# Patient Record
Sex: Male | Born: 1975 | Race: Black or African American | Hispanic: No | Smoking: Never smoker
Health system: Southern US, Community
[De-identification: ages and names within clinical notes are randomized; demographics above are authoritative.]

## PROBLEM LIST (undated history)

## (undated) DIAGNOSIS — E039 Hypothyroidism, unspecified: Secondary | ICD-10-CM

## (undated) DIAGNOSIS — G5600 Carpal tunnel syndrome, unspecified upper limb: Secondary | ICD-10-CM

## (undated) DIAGNOSIS — E7800 Pure hypercholesterolemia, unspecified: Secondary | ICD-10-CM

## (undated) DIAGNOSIS — E079 Disorder of thyroid, unspecified: Secondary | ICD-10-CM

## (undated) DIAGNOSIS — E119 Type 2 diabetes mellitus without complications: Secondary | ICD-10-CM

## (undated) DIAGNOSIS — M109 Gout, unspecified: Secondary | ICD-10-CM

## (undated) DIAGNOSIS — I10 Essential (primary) hypertension: Secondary | ICD-10-CM

## (undated) DIAGNOSIS — E669 Obesity, unspecified: Secondary | ICD-10-CM

## (undated) DIAGNOSIS — G629 Polyneuropathy, unspecified: Secondary | ICD-10-CM

## (undated) HISTORY — PX: TONSILLECTOMY: SUR1361

## (undated) HISTORY — PX: INCISE AND DRAIN ABCESS: PRO64

## (undated) HISTORY — PX: FRACTURE SURGERY: SHX138

---

## 2002-10-02 ENCOUNTER — Emergency Department (HOSPITAL_COMMUNITY): Admission: EM | Admit: 2002-10-02 | Discharge: 2002-10-02 | Payer: Self-pay | Admitting: Emergency Medicine

## 2002-10-02 ENCOUNTER — Encounter: Payer: Self-pay | Admitting: Emergency Medicine

## 2003-06-01 ENCOUNTER — Inpatient Hospital Stay (HOSPITAL_COMMUNITY): Admission: AD | Admit: 2003-06-01 | Discharge: 2003-06-06 | Payer: Self-pay | Admitting: Psychiatry

## 2007-09-20 ENCOUNTER — Inpatient Hospital Stay (HOSPITAL_COMMUNITY): Admission: AD | Admit: 2007-09-20 | Discharge: 2007-09-29 | Payer: Self-pay | Admitting: *Deleted

## 2007-09-20 ENCOUNTER — Ambulatory Visit: Payer: Self-pay | Admitting: *Deleted

## 2008-05-18 ENCOUNTER — Encounter: Payer: Self-pay | Admitting: Cardiology

## 2008-05-30 ENCOUNTER — Encounter: Payer: Self-pay | Admitting: Cardiology

## 2008-05-31 ENCOUNTER — Ambulatory Visit: Payer: Self-pay | Admitting: Cardiology

## 2008-05-31 DIAGNOSIS — E785 Hyperlipidemia, unspecified: Secondary | ICD-10-CM | POA: Insufficient documentation

## 2008-05-31 DIAGNOSIS — R9439 Abnormal result of other cardiovascular function study: Secondary | ICD-10-CM | POA: Insufficient documentation

## 2008-05-31 DIAGNOSIS — E039 Hypothyroidism, unspecified: Secondary | ICD-10-CM | POA: Insufficient documentation

## 2008-06-13 ENCOUNTER — Ambulatory Visit: Payer: Self-pay

## 2008-06-13 ENCOUNTER — Encounter: Payer: Self-pay | Admitting: Cardiology

## 2008-11-20 ENCOUNTER — Telehealth (INDEPENDENT_AMBULATORY_CARE_PROVIDER_SITE_OTHER): Payer: Self-pay | Admitting: *Deleted

## 2008-12-06 ENCOUNTER — Telehealth (INDEPENDENT_AMBULATORY_CARE_PROVIDER_SITE_OTHER): Payer: Self-pay | Admitting: *Deleted

## 2010-06-04 NOTE — H&P (Signed)
Barry [name], Barry [name]              ACCOUNT NO.:  [id number]   MEDICAL RECORD NO.:  [id number]          PATIENT TYPE:  IPS   LOCATION:  0502                          FACILITY:  [facility]   PHYSICIAN:  Geoffery Lyons, M.D.      DATE OF BIRTH:  09-06-1975   DATE OF ADMISSION:  09/20/2007  DATE OF DISCHARGE:                       PSYCHIATRIC ADMISSION ASSESSMENT   DATE/TIME OF ASSESSMENT:  September 21, 2007, at 9:10 a.m.   IDENTIFYING INFORMATION:  [name].  Married.  This is a voluntary admission.   HISTORY OF PRESENT ILLNESS:  [name] admission for this 35 year old,  who reports 1 day of suicidal thoughts, thinking that he should die,  that he has no reason to live.  Had not formulated a clear plan for  suicide.  Reports that his marriage is failing, his kids are acting up  and causing trouble at home, and that about 6 weeks ago he lost his job.  He reports chronic friction in this marriage and feels that he cannot  continue the relationship, that he will be less depressed if he can  separate from his wife, focus on getting another job, and then continue  the relationship with his 39- and 55-year-old sons.  He endorses at least  1 month of increased irritability, yelling at home, losing his temper.  No motivation to do his usual activities, crying more easily, lying in  bed a lot but not really resting, appetite decreased without known  weight loss and dysphoric mood.  He denies any substance abuse but has  noted that instead of drinking 1 beer twice a month he is now drinking 1  beer once to twice a week.  Denies any withdrawal symptoms if he does  not drink.  No homicidal thoughts.   PAST PSYCHIATRIC HISTORY:  Second [facility] admission.  He has a history of  depression in the past with 1 prior suicide attempt by drinking Clorox.  Last admitted to [facility] May 12 to 17, 2005, for depression  and at that time had also lost his job and having issues of  marital  conflict.  At that time, he was diagnosed with major depressive disorder  versus bipolar 2 disorder, mixed type, and discharged stabilized on  Lexapro 10 mg 1-1/2 tablets in the morning.  He also has also taken  Cymbalta in the past.  He is not sure if the Lexapro or the Cymbalta  have been helpful.  He has no current outpatient Monicia Tse.   SOCIAL HISTORY:  Married [place] American male, has been in this  relationship for a total of 8 years now.  Out of work for the past 6  weeks and prior to that was employed at the [employer].  Also has a  history of working for [employer] in the past.  He has 2 sons,  ages 39 and 11, and also is supporting 2 stepchildren of his wife's by a  previous relationship.  Currently has no income coming into the home.  Has a mother who lives nearby but not sure if he can stay with her.  FAMILY HISTORY:  A brother with history of depression and marijuana  abuse.   MEDICAL HISTORY:  Followed at [facility] in [facility]  by Dr. Ronn Melena, MD.  Medical problems are diabetes mellitus type 2,  elevated blood pressure, rule out hypertension.   CURRENT MEDICATIONS:  1. Metformin 1000 mg b.i.d.  2. Glimepiride 4 mg daily.  3. Lisinopril 10 mg daily.  4. Actos 45 mg daily.  5. Lipitor 40 mg daily.  6. Aspirin 81 mg daily.  7. Multivitamin 1 daily.   DRUG ALLERGIES:  None.   POSITIVE PHYSICAL FINDINGS:  Physical exam done in the emergency room.  This is a tall, large-built African American male who appears generally  healthy.  Normal gait.  Nonfocal neuro exam.  Motor is normal.  He is 6  feet, 2 inches tall, 336 pounds.  Temperature 97.9, pulse 78,  respirations 18, blood pressure 134/83.   Diagnostic studies were done in the emergency room at [facility].  Urine drug screen was negative for all substances.  Ethanol  level less than 0.01.  Chemistry:  Sodium 138, potassium 3.7, chloride  102, carbon dioxide  27, BUN 11, creatinine 0.81, and random glucose 160.  Liver enzymes are normal.  CBC is within normal limits.   MENTAL STATUS EXAM:  Fully alert male.  Pleasant, cooperative.  Good eye  contact.  Hygiene is good.  Speech is normal.  Affect appropriate.  Mood  depressed.  Expresses a lot of frustration with the situation with his  marriage.  No evidence of homicidal thoughts.  Recognizes that he is  becoming more reclusive and more irritable, feeling hopeless now without  a job, wants to care for his children and end the marriage hopefully so  that he can focus on his future and maintaining employment.  Cognition  is fully preserved.  Thought processes logical and coherent.  No  evidence of psychosis.  Positive for suicidal thoughts without an active  plan today.  No homicidal thought.  Insight is adequate.  Impulse  control and judgment within normal limits.   AXIS I:  Depressive disorder NOS.  Rule out major depression, recurrent,  severe.  AXIS II:  Deferred.  AXIS III:  Diabetes mellitus, type 2, controlled.  AXIS IV:  Severe issues with chronic marital friction.  AXIS V:  Current 46, past year not known.   The plan is to voluntarily admit him with a goal of alleviating his  suicidal thought, improving his energy and alleviating his depression  symptoms.  We hope to get a family session with his mother and possibly  make some contact with her and evaluate his support system.  We are  going to consider an antidepressant.  He felt that the Lexapro and the  Cymbalta did not help him, and we are going to look at medications  options.  He is in agreement with the plan.      Margaret A. Scott, N.P.      Geoffery Lyons, M.D.  Electronically Signed    MAS/MEDQ  D:  09/21/2007  T:  09/21/2007  Job:  [id number]
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
## 2010-06-07 NOTE — Discharge Summary (Signed)
NAMEJAMESON, TORMEY              ACCOUNT NO.:  [id number]   MEDICAL RECORD NO.:  [id number]          PATIENT TYPE:  IPS   LOCATION:  0502                          FACILITY:  [facility]   PHYSICIAN:  Geoffery Lyons, M.D.      DATE OF BIRTH:  1975-10-03   DATE OF ADMISSION:  09/20/2007  DATE OF DISCHARGE:  09/29/2007                               DISCHARGE SUMMARY   CHIEF COMPLAINT/HISTORY OF PRESENT ILLNESS:  This was the second  admission to [facility] for this 35 year old African  American male married, voluntarily admitted.  Reports 1 day of suicidal  thoughts, thinking that he should die, that he has no reason to live,  has not formulated a clear plan for suicide.  His marriage was failing,  his kids are acting out and causing trouble at home. About 6 weeks prior  to this admission, he lost his job.  Reports chronic friction in his  marriage and feels he cannot continue the relationship.  Endorsed that  he felt he could be less depressed if he could separate from his wife,  focused on getting another job and continue the relationship with his 21  and 59-year-old sons.  One month of increased irritability, yelling,  yelling at home, losing his temper, crying.  No motivation to do  anything, lying in bed a lot, decreased appetite, dysphoric mood.  No  substance abuse.   PAST PSYCHIATRIC HISTORY:  Second time at [facility].  History of  depression in the past.  One prior suicide attempt by drinking Clorox.  Was admitted Jun 01, 2003 to Jun 06, 2003.  At that time,  had also lost  his job and have some marital conflict.  Diagnosed bipolar 2, discharged  on Lexapro.  He has also taken Cymbalta in the past.   ALCOHOL/DRUG HISTORY:  Denies active use of any substances.   MEDICAL HISTORY:  1. Diabetes mellitus type 2.  2. Elevated blood pressure.   MEDICATIONS:  1. Metformin 1000 twice a day.  2. Glimepiride 4 mg per day.  3. Lisinopril 10 mg per day.  4. Actos 45  mg per day.  5. Lipitor 40 mg per day.  6. Aspirin 81 mg per day.  7. Multivitamin 1 daily.   PHYSICAL EXAMINATION:  Failed to show any acute findings.   LABORATORY WORK:  UDS negative for all substances.  Sodium 138,  potassium 3.7, BUN 11, creatinine 0.81, glucose 160.  Liver enzymes  within normal limits.  CBC within normal limits.   MENTAL STATUS EXAM:  Reveals a fully alert, cooperative male, good eye  contact.  Speech is normal in rate, tempo and production.  Mood is  depressed, anxious, irritable.  Affect anxious.  Thought processes are  logical, coherent and relevant.  Endorsed frustration with the situation  with his marriage, scared of losing control, sense of hopeless and  helplessness.  Feeling more irritable.  No delusions.  No active  suicidal or homicidal ideas.  No hallucinations.  Cognition well  preserved.   ADMISSION DIAGNOSES:  AXIS I:  Mood disorder not otherwise specified  versus depressive disorder not otherwise specified.  AXIS II:  No diagnosis.  AXIS III:  Diabetes mellitus type 2, high blood pressure.  AXIS IV:  Moderate.  AXIS V:  Upon admission 35, global assessment of functioning in the last  year 65-70.   COURSE IN THE HOSPITAL:  He was admitted, started individual and group  psychotherapy.  We maintained his medications.  We started him on  Depakote.  As already stated, he endorsed irritability and anger issues  for the last 3 months.  Little things set him off. Depression, mostly it  is all triggered by issues with his wife, also the fact that he lost his  job 6 weeks ago is producing a lot of stress.  On September 22, 2007, he  had tolerated the Depakote well.  Endorsed that he really wanted to get  his mood and anger under control.  We worked with the medication as well  with anger management.  On September 23, 2007, dealing with the loss of  the relationship.  Says he knows he cannot go back with her, would not  want to relocate as wants to be  close to his children.  Endorsed that he  really needed to get a job and get his life back together.  Feels anger  mostly triggered by thinking of the situation with his wife.  On  September 24, 2007, he did not sleep too well  the night before, dealing  with the conflict in the relationship with his wife.  Afraid to lose  control.  The next 48 hours, he continued to work on self.  We continued  to adjust the medication.  We gave him some trazodone for sleep.  On  September 28, 2007, he was getting his medication adjusted.  Depakote was  increased.  He spoke with wife over the phone and admitted to extreme  anger, getting very upset in her presence.  He feels he would have lost  it and did not want to put himself in that situation.  By September 30, 2007, he was in full contact with reality.  Had a plan of how to address  things when he got out of the hospital.  He was ready to move on.  Felt  that the medications were helping.  Willing to pursue it further.  Endorsed no active suicidal or homicidal ideation.   DISCHARGE DIAGNOSES:  AXIS I:  Mood disorder not otherwise specified.  Depressive disorder not otherwise specified.  AXIS II:  No diagnosis.  AXIS III:  Diabetes mellitus, high blood pressure.  AXIS IV:  Moderate.  AXIS V:  Upon discharge 55-60.   DISCHARGE MEDICATIONS:  1. Depakote 250 three times a day and 500 at bedtime.  2. Amaryl 4 mg in the morning.  3. Lisinopril 10 mg per day.  4. Actos 45 mg per day.  5. Lipitor 40 mg per day.  6. Metformin 500 two twice a day.  7. Aspirin 81 mg per day.  8. NovoLog 70/30 25 units before breakfast and supper.   FOLLOW UP:  [facility].      Geoffery Lyons, M.D.  Electronically Signed     IL/MEDQ  D:  10/06/2007  T:  10/08/2007  Job:  [id number]

## 2010-10-23 LAB — GLUCOSE, CAPILLARY
Glucose-Capillary: 177 — ABNORMAL HIGH
Glucose-Capillary: 225 — ABNORMAL HIGH
Glucose-Capillary: 236 — ABNORMAL HIGH
Glucose-Capillary: 237 — ABNORMAL HIGH
Glucose-Capillary: 238 — ABNORMAL HIGH
Glucose-Capillary: 253 — ABNORMAL HIGH
Glucose-Capillary: 255 — ABNORMAL HIGH
Glucose-Capillary: 259 — ABNORMAL HIGH
Glucose-Capillary: 260 — ABNORMAL HIGH
Glucose-Capillary: 274 — ABNORMAL HIGH
Glucose-Capillary: 275 — ABNORMAL HIGH
Glucose-Capillary: 277 — ABNORMAL HIGH
Glucose-Capillary: 297 — ABNORMAL HIGH
Glucose-Capillary: 322 — ABNORMAL HIGH
Glucose-Capillary: 326 — ABNORMAL HIGH
Glucose-Capillary: 334 — ABNORMAL HIGH
Glucose-Capillary: 393 — ABNORMAL HIGH

## 2014-01-20 ENCOUNTER — Emergency Department (HOSPITAL_COMMUNITY)
Admission: EM | Admit: 2014-01-20 | Discharge: 2014-01-20 | Disposition: A | Discharge status: Home / Self Care | Payer: Medicaid Other | Attending: Emergency Medicine | Admitting: Emergency Medicine

## 2014-01-20 ENCOUNTER — Encounter (HOSPITAL_COMMUNITY): Payer: Self-pay | Admitting: Emergency Medicine

## 2014-01-20 DIAGNOSIS — R112 Nausea with vomiting, unspecified: Secondary | ICD-10-CM | POA: Diagnosis not present

## 2014-01-20 DIAGNOSIS — E860 Dehydration: Secondary | ICD-10-CM | POA: Diagnosis not present

## 2014-01-20 DIAGNOSIS — F10129 Alcohol abuse with intoxication, unspecified: Secondary | ICD-10-CM | POA: Diagnosis present

## 2014-01-20 DIAGNOSIS — E119 Type 2 diabetes mellitus without complications: Secondary | ICD-10-CM | POA: Diagnosis not present

## 2014-01-20 DIAGNOSIS — R7989 Other specified abnormal findings of blood chemistry: Secondary | ICD-10-CM

## 2014-01-20 DIAGNOSIS — E669 Obesity, unspecified: Secondary | ICD-10-CM | POA: Insufficient documentation

## 2014-01-20 DIAGNOSIS — F10929 Alcohol use, unspecified with intoxication, unspecified: Secondary | ICD-10-CM

## 2014-01-20 DIAGNOSIS — Z720 Tobacco use: Secondary | ICD-10-CM | POA: Insufficient documentation

## 2014-01-20 HISTORY — DX: Pure hypercholesterolemia, unspecified: E78.00

## 2014-01-20 HISTORY — DX: Obesity, unspecified: E66.9

## 2014-01-20 HISTORY — DX: Type 2 diabetes mellitus without complications: E11.9

## 2014-01-20 LAB — CBC WITH DIFFERENTIAL/PLATELET
Basophils Absolute: 0 10*3/uL (ref 0.0–0.1)
Basophils Relative: 0 % (ref 0–1)
EOS PCT: 3 % (ref 0–5)
Eosinophils Absolute: 0.2 10*3/uL (ref 0.0–0.7)
HEMATOCRIT: 45.6 % (ref 39.0–52.0)
Hemoglobin: 16.5 g/dL (ref 13.0–17.0)
LYMPHS ABS: 3.5 10*3/uL (ref 0.7–4.0)
Lymphocytes Relative: 50 % — ABNORMAL HIGH (ref 12–46)
MCH: 33.1 pg (ref 26.0–34.0)
MCHC: 36.2 g/dL — ABNORMAL HIGH (ref 30.0–36.0)
MCV: 91.6 fL (ref 78.0–100.0)
MONO ABS: 0.6 10*3/uL (ref 0.1–1.0)
MONOS PCT: 9 % (ref 3–12)
NEUTROS ABS: 2.7 10*3/uL (ref 1.7–7.7)
Neutrophils Relative %: 38 % — ABNORMAL LOW (ref 43–77)
Platelets: ADEQUATE 10*3/uL (ref 150–400)
RBC: 4.98 MIL/uL (ref 4.22–5.81)
RDW: 14.1 % (ref 11.5–15.5)
WBC: 7 10*3/uL (ref 4.0–10.5)

## 2014-01-20 LAB — BASIC METABOLIC PANEL
Anion gap: 13 (ref 5–15)
BUN: 30 mg/dL — AB (ref 6–23)
CHLORIDE: 105 meq/L (ref 96–112)
CO2: 21 mmol/L (ref 19–32)
Calcium: 9.8 mg/dL (ref 8.4–10.5)
Creatinine, Ser: 1.71 mg/dL — ABNORMAL HIGH (ref 0.50–1.35)
GFR calc Af Amer: 58 mL/min — ABNORMAL LOW (ref >90)
GFR calc non Af Amer: 50 mL/min — ABNORMAL LOW (ref >90)
GLUCOSE: 137 mg/dL — AB (ref 70–99)
POTASSIUM: 4 mmol/L (ref 3.5–5.1)
Sodium: 139 mmol/L (ref 135–145)

## 2014-01-20 LAB — CBG MONITORING, ED
Glucose-Capillary: 114 mg/dL — ABNORMAL HIGH (ref 70–99)
Glucose-Capillary: 113 mg/dL — ABNORMAL HIGH (ref 70–99)

## 2014-01-20 MED ORDER — ONDANSETRON 4 MG PO TBDP: 8.0000 mg | ORAL_TABLET | Freq: Once | ORAL | Status: DC

## 2014-01-20 MED ORDER — ONDANSETRON HCL 4 MG/2ML IJ SOLN
4.0000 mg | Freq: Once | INTRAMUSCULAR | Status: AC
  Administered 2014-01-20: 4 mg via INTRAVENOUS
  Filled 2014-01-20: qty 2

## 2014-01-20 MED ORDER — MECLIZINE HCL 25 MG PO TABS
25.0000 mg | ORAL_TABLET | Freq: Once | ORAL | Status: AC
  Administered 2014-01-20: 25 mg via ORAL
  Filled 2014-01-20: qty 1

## 2014-01-20 MED ORDER — SODIUM CHLORIDE 0.9 % IV BOLUS (SEPSIS)
1000.0000 mL | Freq: Once | INTRAVENOUS | Status: AC
  Administered 2014-01-20: 1000 mL via INTRAVENOUS

## 2014-01-20 MED ORDER — FAMOTIDINE 20 MG PO TABS
40.0000 mg | ORAL_TABLET | Freq: Once | ORAL | Status: AC
  Administered 2014-01-20: 40 mg via ORAL
  Filled 2014-01-20: qty 2

## 2014-01-20 NOTE — ED Notes (Signed)
CBG 114 

## 2014-01-20 NOTE — ED Notes (Signed)
Pt. reports nausea and vomitting after binge drinking this evening , alert and oriented/ ambulatory., respirations unlabored .

## 2014-01-20 NOTE — ED Notes (Signed)
Pt unable to ambulate at this time 

## 2014-01-20 NOTE — Discharge Instructions (Signed)
Your kidney function was found to be somewhat diminished today (elevated creatinine). This is likely due to dehydration. He had been given IV fluids during her stay. You will need to have your serum creatinine rechecked within the next 3 or 4 days to make sure it has come down. This is very important as this can be a marker of long-term problems with the kidneys. If you are unable to follow up with a primary care physician. You may return to this emergency Department for a recheck of your serum creatinine. Please drink plenty of fluids. You may use over-the-counter medications for nausea, such as Dramamine or meclizine. Do not use any anti-inflammatory medications until you have had her creatinine rechecked. These include the NSAID class of drugs: Ibuprofen, Advil, Aleve, Motrin, naproxen. You may take Tylenol. Follow up closely with your primary care physician.   Alcohol Intoxication Alcohol intoxication occurs when the amount of alcohol that a person has consumed impairs his or her ability to mentally and physically function. Alcohol directly impairs the normal chemical activity of the brain. Drinking large amounts of alcohol can lead to changes in mental function and behavior, and it can cause many physical effects that can be harmful.  Alcohol intoxication can range in severity from mild to very severe. Various factors can affect the level of intoxication that occurs, such as the person's age, gender, weight, frequency of alcohol consumption, and the presence of other medical conditions (such as diabetes, seizures, or heart conditions). Dangerous levels of alcohol intoxication may occur when people drink large amounts of alcohol in a short period (binge drinking). Alcohol can also be especially dangerous when combined with certain prescription medicines or "recreational" drugs. SIGNS AND SYMPTOMS Some common signs and symptoms of mild alcohol intoxication include:  Loss of coordination.  Changes in  mood and behavior.  Impaired judgment.  Slurred speech. As alcohol intoxication progresses to more severe levels, other signs and symptoms will appear. These may include:  Vomiting.  Confusion and impaired memory.  Slowed breathing.  Seizures.  Loss of consciousness. DIAGNOSIS  Your health care provider will take a medical history and perform a physical exam. You will be asked about the amount and type of alcohol you have consumed. Blood tests will be done to measure the concentration of alcohol in your blood. In many places, your blood alcohol level must be lower than 80 mg/dL (1.61%) to legally drive. However, many dangerous effects of alcohol can occur at much lower levels.  TREATMENT  People with alcohol intoxication often do not require treatment. Most of the effects of alcohol intoxication are temporary, and they go away as the alcohol naturally leaves the body. Your health care provider will monitor your condition until you are stable enough to go home. Fluids are sometimes given through an IV access tube to help prevent dehydration.  HOME CARE INSTRUCTIONS  Do not drive after drinking alcohol.  Stay hydrated. Drink enough water and fluids to keep your urine clear or pale yellow. Avoid caffeine.   Only take over-the-counter or prescription medicines as directed by your health care provider.  SEEK MEDICAL CARE IF:   You have persistent vomiting.   You do not feel better after a few days.  You have frequent alcohol intoxication. Your health care provider can help determine if you should see a substance use treatment counselor. SEEK IMMEDIATE MEDICAL CARE IF:   You become shaky or tremble when you try to stop drinking.   You shake uncontrollably (seizure).  You throw up (vomit) blood. This may be bright red or may look like black coffee grounds.   You have blood in your stool. This may be bright red or may appear as a black, tarry, bad smelling stool.   You  become lightheaded or faint.  MAKE SURE YOU:   Understand these instructions.  Will watch your condition.  Will get help right away if you are not doing well or get worse. Document Released: 10/16/2004 Document Revised: 09/08/2012 Document Reviewed: 06/11/2012 [facility] Patient Information 2015 [place], [place]. This information is not intended to replace advice given to you by your health care provider. Make sure you discuss any questions you have with your health care provider.

## 2014-01-20 NOTE — ED Provider Notes (Signed)
7:08 AM BP 103/57 mmHg  Pulse 79  Temp(Src) 97.4 F (36.3 C) (Oral)  Resp 18  Ht  (1.905 m)  Wt 379 lb (171.913 kg)  BMI 47.37 kg/m2  SpO2 100% Patient with etoh intoxication. Receiving IV fluids. Awaiting sobriety.   9:52 AM BP 115/72 mmHg  Pulse 70  Temp(Src) 97.4 F (36.3 C) (Oral)  Resp 18  Ht  (1.905 m)  Wt 379 lb (171.913 kg)  BMI 47.37 kg/m2  SpO2 99% GLUCOSE, BLD  Date Value Ref Range Status  01/20/2014 137* 70 - 99 mg/dL Final    Patient appears sober. Ready for discharge. Some mild abdominal discomfort. Have ordered meclizine and Pepcid prior to discharge. Patient given oral fluids. He is alert and ambulatory. I reviewed the patient's labs, which showed elevated serum creatinine. Patient will need a recheck of his creatinine function. Have given him explicit directions in his discharge paperwork, which include avoiding NSAIDs. He follow up with primary care or return to the emergency department for The recheck. He appears safe for discharge at this time.  Pt feels improved after observation and/or treatment in ED.   I personally reviewed the imaging tests through PACS system. I have reviewed and interpreted Lab values. I reviewed available ER/hospitalization records through the EMR   Results for orders placed or performed during the hospital encounter of 01/20/14  CBC with Differential  Result Value Ref Range   WBC 7.0 4.0 - 10.5 K/uL   RBC 4.98 4.22 - 5.81 MIL/uL   Hemoglobin 16.5 13.0 - 17.0 g/dL   HCT 16.1 09.6 - 04.5 %   MCV 91.6 78.0 - 100.0 fL   MCH 33.1 26.0 - 34.0 pg   MCHC 36.2 (H) 30.0 - 36.0 g/dL   RDW 40.9 81.1 - 91.4 %   Platelets  150 - 400 K/uL    PLATELET CLUMPS NOTED ON SMEAR, COUNT APPEARS ADEQUATE   Neutrophils Relative % 38 (L) 43 - 77 %   Lymphocytes Relative 50 (H) 12 - 46 %   Monocytes Relative 9 3 - 12 %   Eosinophils Relative 3 0 - 5 %   Basophils Relative 0 0 - 1 %   Neutro Abs 2.7 1.7 - 7.7 K/uL   Lymphs Abs 3.5  0.7 - 4.0 K/uL   Monocytes Absolute 0.6 0.1 - 1.0 K/uL   Eosinophils Absolute 0.2 0.0 - 0.7 K/uL   Basophils Absolute 0.0 0.0 - 0.1 K/uL   WBC Morphology ATYPICAL LYMPHOCYTES   Basic metabolic panel  Result Value Ref Range   Sodium 139 135 - 145 mmol/L   Potassium 4.0 3.5 - 5.1 mmol/L   Chloride 105 96 - 112 mEq/L   CO2 21 19 - 32 mmol/L   Glucose, Bld 137 (H) 70 - 99 mg/dL   BUN 30 (H) 6 - 23 mg/dL   Creatinine, Ser 7.82 (H) 0.50 - 1.35 mg/dL   Calcium 9.8 8.4 - 95.6 mg/dL   GFR calc non Af Amer 50 (L) >90 mL/min   GFR calc Af Amer 58 (L) >90 mL/min   Anion gap 13 5 - 15  CBG monitoring, ED  Result Value Ref Range   Glucose-Capillary 114 (H) 70 - 99 mg/dL  CBG monitoring, ED  Result Value Ref Range   Glucose-Capillary 113 (H) 70 - 99 mg/dL     Arthor Captain, PA-C 01/20/14 2130  Rolland Porter, MD 01/27/14 2353

## 2014-01-20 NOTE — ED Provider Notes (Signed)
CSN: [id number]     Arrival date & time 01/20/14  0325 History   First MD Initiated Contact with Patient 01/20/14 0344     Chief Complaint  Patient presents with  . Alcohol Intoxication    (Consider location/radiation/quality/duration/timing/severity/associated sxs/prior Treatment) HPI Comments: Patient is a 39 year old male with a history of insulin-dependent diabetes who presents to the emergency department for further evaluation of alcohol intoxication. Patient states that he started drinking at 2200 yesterday. He was drinking mostly liquor in the form of shots and mixed drinks, though does endorse drinking some beer and wine. He states that he developed some nausea with "a couple" episodes of emesis. No associated loss of consciousness or SOB. Patient brought to ED by family friends. He is concerned about his blood sugar as a result of his excessive ETOH consumption.  Patient is a 39 y.o. male presenting with intoxication. The history is provided by the patient. No language interpreter was used.  Alcohol Intoxication Associated symptoms include nausea and vomiting.    Past Medical History  Diagnosis Date  . Obesity   . Diabetes mellitus without complication   . Hypercholesteremia    Past Surgical History  Procedure Laterality Date  . Tonsillectomy    . Incise and drain abcess     No family history on file. History  Substance Use Topics  . Smoking status: Current Every Day Smoker  . Smokeless tobacco: Not on file  . Alcohol Use: Yes    Review of Systems  Gastrointestinal: Positive for nausea and vomiting.  All other systems reviewed and are negative.   Allergies  Review of patient's allergies indicates no known allergies.  Home Medications   Prior to Admission medications   Not on File   BP 103/57 mmHg  Pulse 79  Temp(Src) 97.4 F (36.3 C) (Oral)  Resp 18  Ht  (1.905 m)  Wt 379 lb (171.913 kg)  BMI 47.37 kg/m2  SpO2 100%   Physical Exam   Constitutional: He is oriented to person, place, and time. He appears well-developed and well-nourished. No distress.  Nontoxic/nonseptic appearing  HENT:  Head: Normocephalic and atraumatic.  Eyes: Conjunctivae and EOM are normal. No scleral icterus.  Neck: Normal range of motion.  Pulmonary/Chest: Effort normal. No respiratory distress.  Respirations even and unlabored  Musculoskeletal: Normal range of motion.  Neurological: He is alert and oriented to person, place, and time. He exhibits normal muscle tone. Coordination normal.  GCS 15. Speech is goal oriented without slurring. Patient moves extremities without ataxia. He answers questions appropriately and follows simple commands.  Skin: Skin is warm and dry. No rash noted. He is not diaphoretic. No erythema. No pallor.  Psychiatric: He has a normal mood and affect. His behavior is normal.  Nursing note and vitals reviewed.   ED Course  Procedures (including critical care time) Labs Review Labs Reviewed  CBC WITH DIFFERENTIAL - Abnormal; Notable for the following:    MCHC 36.2 (*)    Neutrophils Relative % 38 (*)    Lymphocytes Relative 50 (*)    All other components within normal limits  BASIC METABOLIC PANEL - Abnormal; Notable for the following:    Glucose, Bld 137 (*)    BUN 30 (*)    Creatinine, Ser 1.71 (*)    GFR calc non Af Amer 50 (*)    GFR calc Af Amer 58 (*)    All other components within normal limits  CBG MONITORING, ED    Imaging Review  No results found.   EKG Interpretation None      MDM   Final diagnoses:  Alcohol intoxication, with unspecified complication  Dehydration  Elevated serum creatinine    39 year old male presents to the emergency department for further evaluation of nausea and emesis following an evening of binge drinking. Patient with a history of insulin-dependent diabetes. He is concerned about dehydration as well as alcohol poisoning. Patient is speaking in full goal oriented  sentences without slurring. He follows simple commands and answers questions appropriately. Lungs are clear auscultation. Patient has no complaints of shortness of breath. He has no tachypnea, dyspnea, or hypoxia. Doubt aspiration prior to arrival.  Laboratory workup today is reassuring. CBG 114. He has a normal anion gap. No signs of acidosis. Patient does have an elevated serum creatinine to 1.71. Suspect that this may be secondary to dehydration. No old value for comparison. Patient being hydrated with multiple liters of IV fluids. He has now continued to metabolize his ETOH and appears more intoxicated than arrival. He is now unable to ambulate independently. Patient to be signed out to my colleague, Arthor Captain, PA-C, at shift change who will monitor patient and disposition when sober. Patient currently sleeping, in NAD.   Filed Vitals:   01/20/14 0430 01/20/14 0445 01/20/14 0500 01/20/14 0602  BP: 111/59 113/53 111/55 103/57  Pulse: 78 76 77 79  Temp:      TempSrc:      Resp:    18  Height:      Weight:      SpO2: 96% 96% 96% 100%      Antony Madura, PA-C 01/20/14 5366  Olivia Mackie, MD 01/20/14 [phone]

## 2014-01-20 NOTE — ED Notes (Signed)
Pt arousable, answers questions approprietly, IV infusing in right arm-- positional, encouraged to keep arm straight.

## 2015-03-09 ENCOUNTER — Emergency Department (HOSPITAL_COMMUNITY): Payer: Medicaid Other

## 2015-03-09 ENCOUNTER — Encounter (HOSPITAL_COMMUNITY): Payer: Self-pay | Admitting: Emergency Medicine

## 2015-03-09 ENCOUNTER — Emergency Department (HOSPITAL_COMMUNITY)
Admission: EM | Admit: 2015-03-09 | Discharge: 2015-03-09 | Disposition: A | Discharge status: Home / Self Care | Payer: Medicaid Other | Attending: Emergency Medicine | Admitting: Emergency Medicine

## 2015-03-09 DIAGNOSIS — F17200 Nicotine dependence, unspecified, uncomplicated: Secondary | ICD-10-CM | POA: Insufficient documentation

## 2015-03-09 DIAGNOSIS — E119 Type 2 diabetes mellitus without complications: Secondary | ICD-10-CM | POA: Diagnosis not present

## 2015-03-09 DIAGNOSIS — E669 Obesity, unspecified: Secondary | ICD-10-CM | POA: Insufficient documentation

## 2015-03-09 DIAGNOSIS — M25562 Pain in left knee: Secondary | ICD-10-CM | POA: Diagnosis present

## 2015-03-09 DIAGNOSIS — M1712 Unilateral primary osteoarthritis, left knee: Secondary | ICD-10-CM | POA: Diagnosis not present

## 2015-03-09 IMAGING — CR DG KNEE COMPLETE 4+V*L*
4 series · 4 of 4 positions shown · non-contrast
Comparison: [DATE].

CLINICAL DATA: Left knee pain and swelling for the past 3 days.

EXAM:
LEFT KNEE - COMPLETE 4+ VIEW

[knee ap]
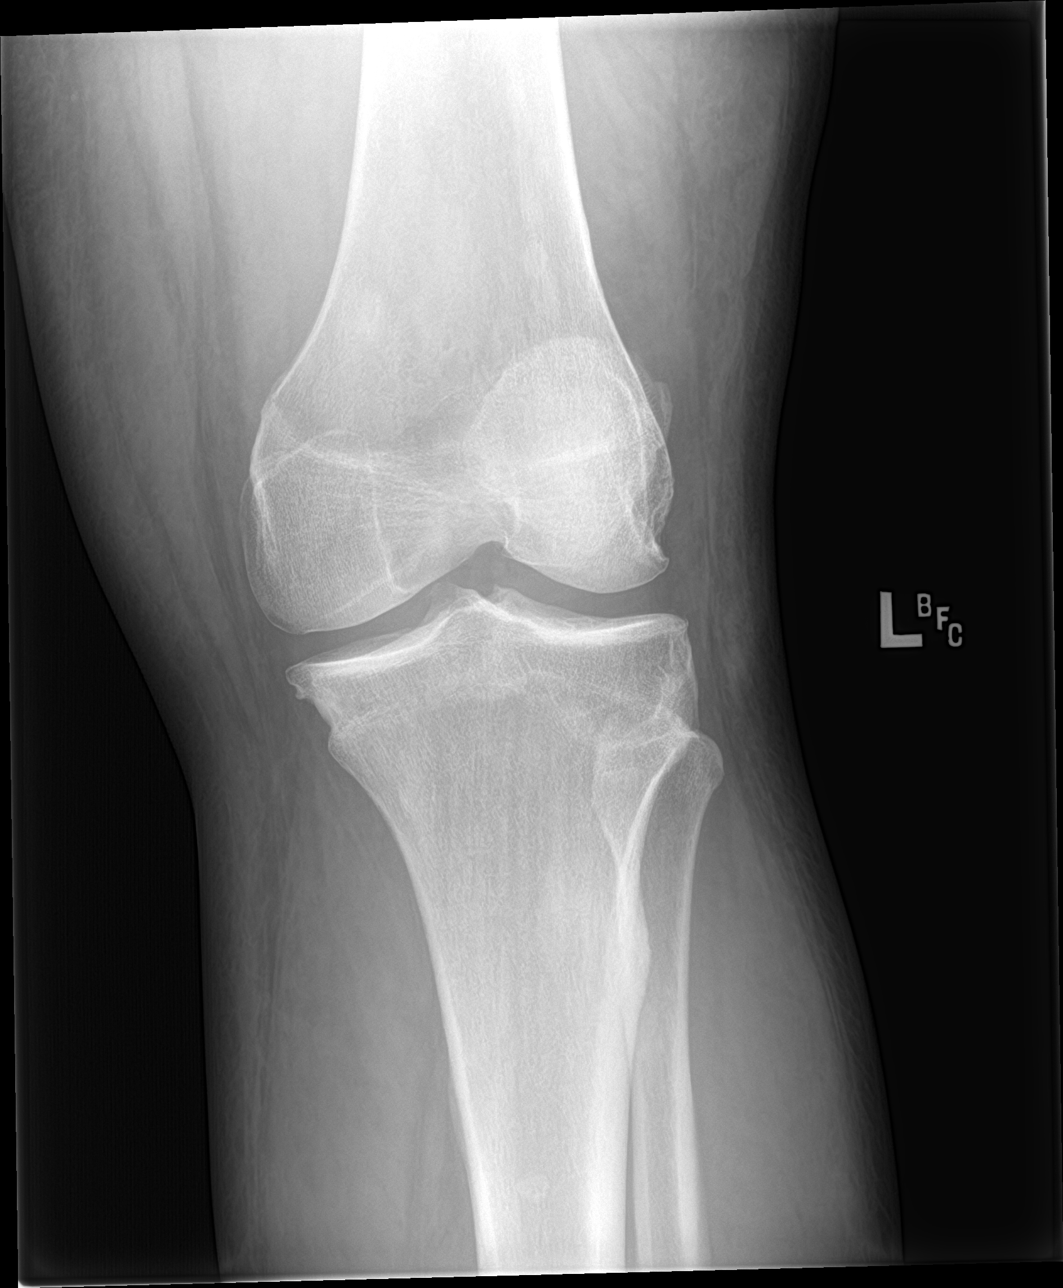

[tunnel]
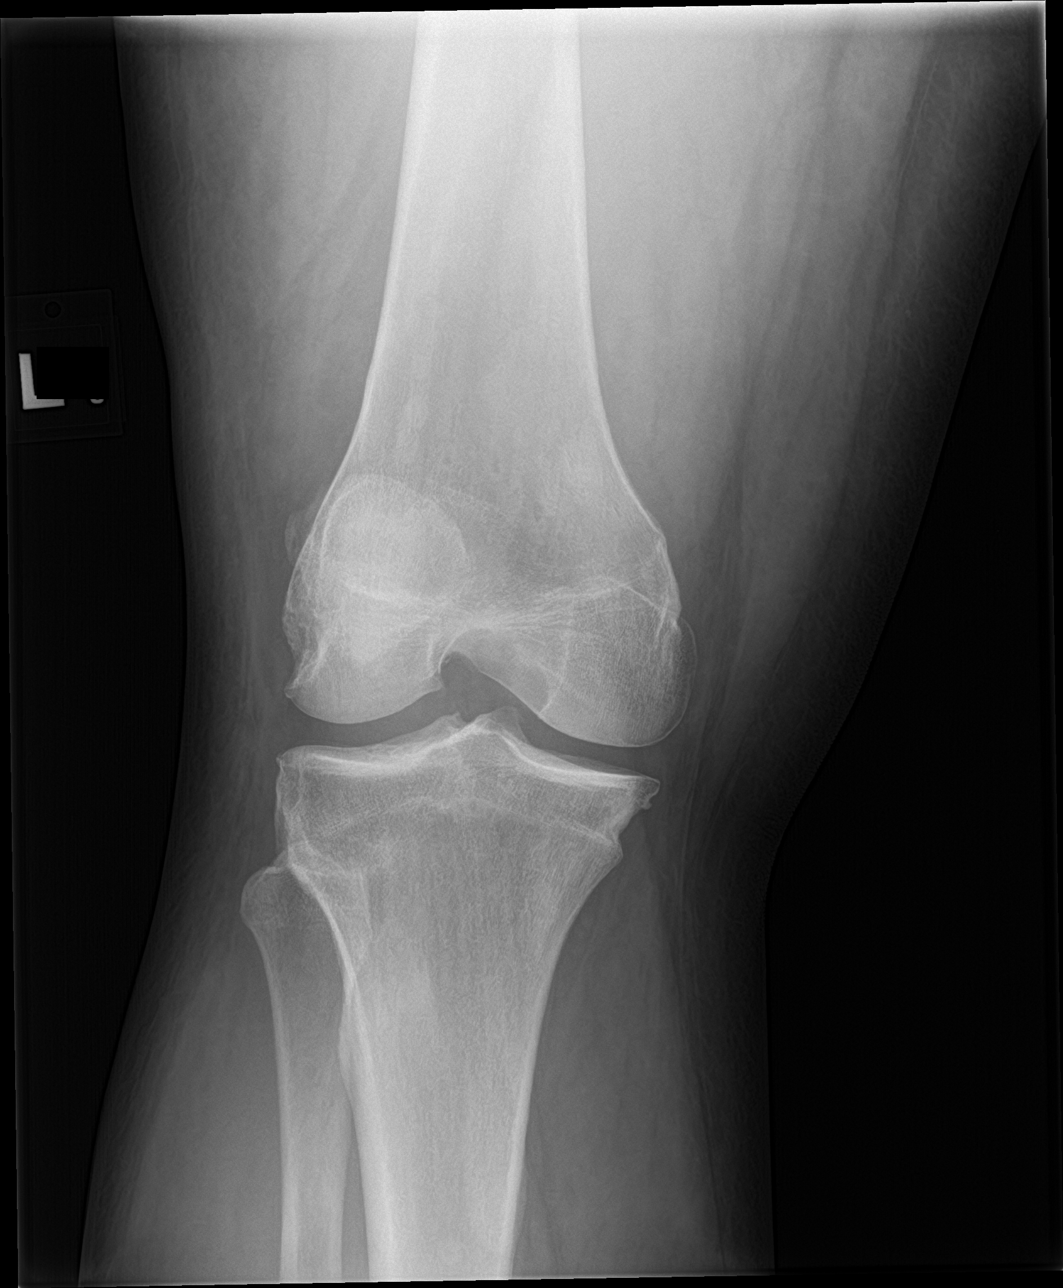

[knee lat]
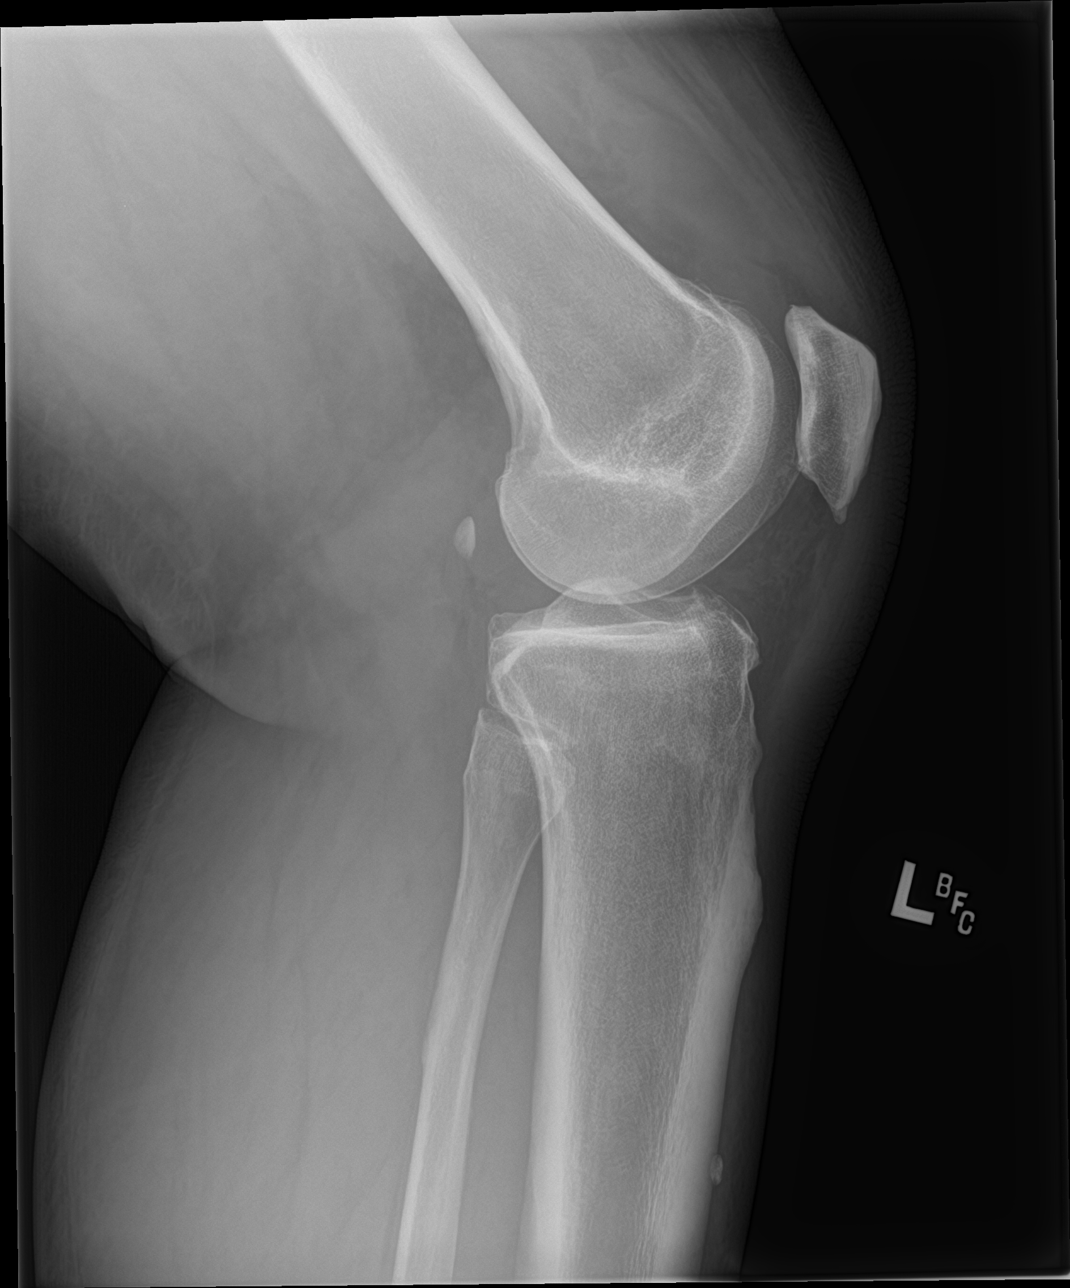

[knee sunrise]
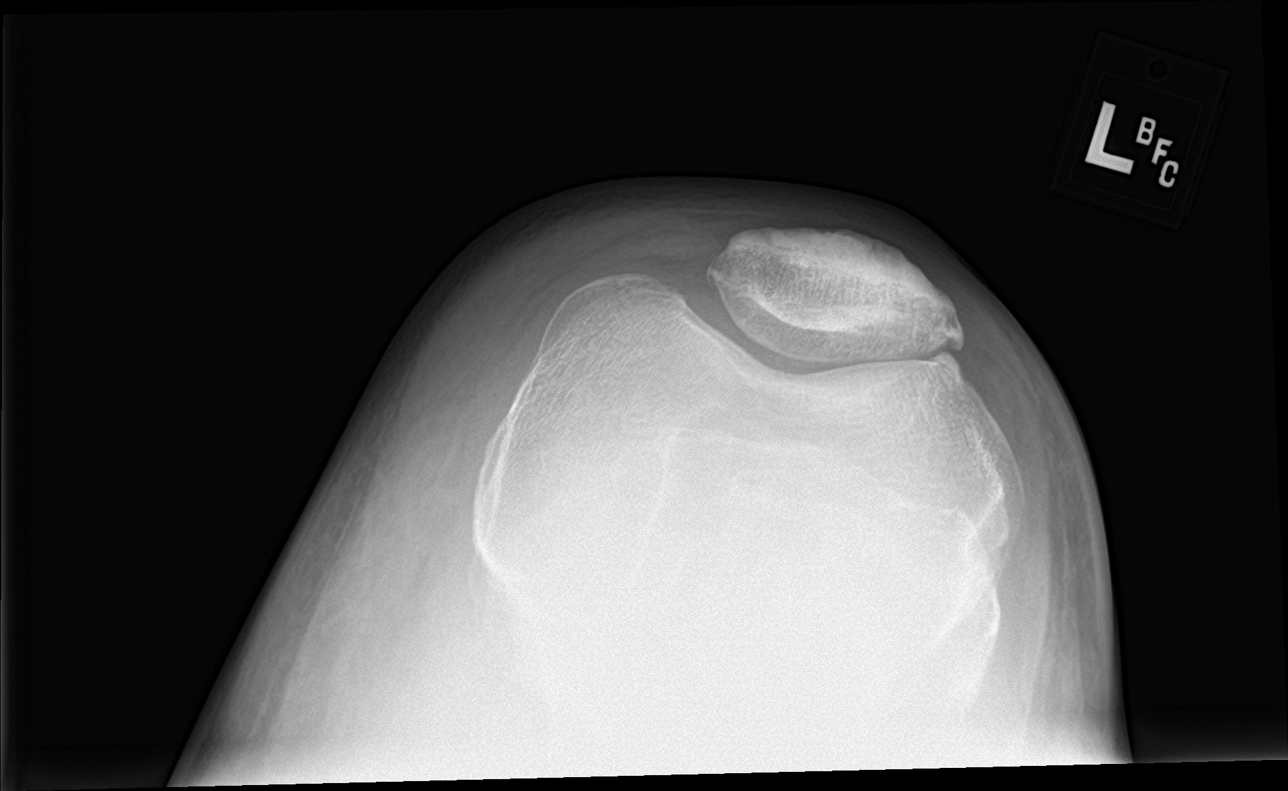

[4 of 4 positions shown; findings below may reference images not displayed]

FINDINGS: Mild spur formation involving all 3 joint compartments. Moderately
large effusion. No fracture or dislocation.
IMPRESSION: Tricompartmental degenerative changes with a moderately large
effusion.

## 2015-03-09 MED ORDER — CYCLOBENZAPRINE HCL 10 MG PO TABS: 10.0000 mg | ORAL_TABLET | Freq: Two times a day (BID) | ORAL | Status: DC | PRN

## 2015-03-09 MED ORDER — IBUPROFEN 400 MG PO TABS
800.0000 mg | ORAL_TABLET | Freq: Once | ORAL | Status: AC
  Administered 2015-03-09: 800 mg via ORAL

## 2015-03-09 MED ORDER — OXYCODONE-ACETAMINOPHEN 5-325 MG PO TABS
1.0000 | ORAL_TABLET | Freq: Once | ORAL | Status: AC
  Administered 2015-03-09: 1 via ORAL
  Filled 2015-03-09: qty 1

## 2015-03-09 NOTE — ED Provider Notes (Signed)
CSN: [id number]     Arrival date & time 03/09/15  1955 History  By signing my name below, I, Soijett Blue, attest that this documentation has been prepared under the direction and in the presence of Wynetta Emery, PA-C Electronically Signed: Soijett Blue, ED Scribe. 03/09/2015. 8:20 PM.   Chief Complaint  Patient presents with  . Knee Pain      The history is provided by the patient. No language interpreter was used.    Barry [name] is a 40 y.o. male with a medical hx of DM, hypercholesteremia, and diabetic neuropathy, who presents to the Emergency Department complaining of 8/10 left knee pain onset 4 days. He notes that his left knee pain is worsened with movement  Pt denies any alleviating factors. Pt denies injury/trauma/fall to the left knee. Pt has had left knee pain in the past but denies seeing an orthopedist for his left knee. He notes that he has not tried any medications for the relief of his symptoms. He denies color change, wound, rash, swelling, and any other symptoms.    Past Medical History  Diagnosis Date  . Obesity   . Diabetes mellitus without complication (HCC)   . Hypercholesteremia    Past Surgical History  Procedure Laterality Date  . Tonsillectomy    . Incise and drain abcess     No family history on file. Social History  Substance Use Topics  . Smoking status: Current Every Day Smoker  . Smokeless tobacco: None  . Alcohol Use: Yes    Review of Systems  A complete 10 system review of systems was obtained and all systems are negative except as noted in the HPI and PMH.   Allergies  Review of patient's allergies indicates no known allergies.  Home Medications   Prior to Admission medications   Not on File   There were no vitals taken for this visit. Physical Exam  Constitutional: He is oriented to person, place, and time. He appears well-developed and well-nourished. No distress.  HENT:  Head: Normocephalic and atraumatic.  Eyes: EOM are  normal.  Neck: Neck supple.  Cardiovascular: Normal rate.   Pulmonary/Chest: Effort normal. No respiratory distress.  Musculoskeletal: Normal range of motion.       Left knee: Tenderness found.  Left knee: Stable to valgus/varus stress. Anterior/posterior drawer test stable. Joint line is non-tender. Point tenderness on the tibial tuberosity.  Warmth, good active range of motion.  Neurological: He is alert and oriented to person, place, and time.  Skin: Skin is warm and dry.  Psychiatric: He has a normal mood and affect. His behavior is normal.  Nursing note and vitals reviewed.   ED Course  Procedures (including critical care time) DIAGNOSTIC STUDIES: Oxygen Saturation is 100% on RA, nl by my interpretation.    COORDINATION OF CARE: 8:20 PM Discussed treatment plan with pt at bedside which includes percocet, ibuprofen, left knee xray and pt agreed to plan.    Labs Review Labs Reviewed - No data to display  Imaging Review Dg Knee Complete 4 Views Left  03/09/2015  CLINICAL DATA:  Left knee pain and swelling for the past 3 days. EXAM: LEFT KNEE - COMPLETE 4+ VIEW COMPARISON:  06/18/2011. FINDINGS: Mild spur formation involving all 3 joint compartments. Moderately large effusion. No fracture or dislocation. IMPRESSION: Tricompartmental degenerative changes with a moderately large effusion. Electronically Signed   By: Beckie Salts M.D.   On: 03/09/2015 20:42   I have personally reviewed and evaluated these images  as part of my medical decision-making.   EKG Interpretation None      MDM   Final diagnoses:  None    Filed Vitals:   03/09/15 2006  BP: 115/66  Pulse: 95  Temp: 97.8 F (36.6 C)  TempSrc: Oral  Resp: 22  Height:  (1.905 m)  Weight: 157.398 kg  SpO2: 100%    Medications  oxyCODONE-acetaminophen (PERCOCET/ROXICET) 5-325 MG per tablet 1 tablet (1 tablet Oral Given 03/09/15 2008)  ibuprofen (ADVIL,MOTRIN) tablet 800 mg (800 mg Oral Given 03/09/15 2008)     Barry [name] is 40 y.o. male presenting with severe pain to left knee, exacerbated by movement, there was no trauma. Knee exam with focal bony tenderness of the tibial tuberosity, otherwise stable. Good active range of motion, no warmth. I doubt this is a septic joint. X-ray with the tricompartmental arthritis and effusion. Patient is given crutches, knee sleeve and orthopedic follow-up.  Evaluation does not show pathology that would require ongoing emergent intervention or inpatient treatment. Pt is hemodynamically stable and mentating appropriately. Discussed findings and plan with patient/guardian, who agrees with care plan. All questions answered. Return precautions discussed and outpatient follow up given.   Discharge Medication List as of 03/09/2015  8:54 PM    START taking these medications   Details  cyclobenzaprine (FLEXERIL) 10 MG tablet Take 1 tablet (10 mg total) by mouth 2 (two) times daily as needed for muscle spasms., Starting 03/09/2015, Until Discontinued, Print         I personally performed the services described in this documentation, which was scribed in my presence. The recorded information has been reviewed and is accurate.    Wynetta Emery, PA-C 03/09/15 2147  Linwood Dibbles, MD 03/10/15 [phone]

## 2015-03-09 NOTE — Discharge Instructions (Signed)
For pain control you may take up to  of ibuprofen (that is usually 4 over the counter pills)  3 times a day (take with food) and acetaminophen  (this is 3 over the counter pills) four times a day. Do not drink alcohol or combine with other medications that have acetaminophen as an ingredient (Read the labels!).   For breakthrough pain you may take Flexeril. Do not drink alcohol, drive or operate heavy machinery when taking Flexeril.  Please follow with your primary care doctor in the next 2 days for a check-up. They must obtain records for further management.   Do not hesitate to return to the Emergency Department for any new, worsening or concerning symptoms.   Arthritis Arthritis is a term that is commonly used to refer to joint pain or joint disease. There are more than 100 types of arthritis. CAUSES The most common cause of this condition is wear and tear of a joint. Other causes include:  Gout.  Inflammation of a joint.  An infection of a joint.  Sprains and other injuries near the joint.  A drug reaction or allergic reaction. In some cases, the cause may not be known. SYMPTOMS The main symptom of this condition is pain in the joint with movement. Other symptoms include:  Redness, swelling, or stiffness at a joint.  Warmth coming from the joint.  Fever.  Overall feeling of illness. DIAGNOSIS This condition may be diagnosed with a physical exam and tests, including:  Blood tests.  Urine tests.  Imaging tests, such as MRI, X-rays, or a CT scan. Sometimes, fluid is removed from a joint for testing. TREATMENT Treatment for this condition may involve:  Treatment of the cause, if it is known.  Rest.  Raising (elevating) the joint.  Applying cold or hot packs to the joint.  Medicines to improve symptoms and reduce inflammation.  Injections of a steroid such as cortisone into the joint to help reduce pain and inflammation. Depending on the cause of your  arthritis, you may need to make lifestyle changes to reduce stress on your joint. These changes may include exercising more and losing weight. HOME CARE INSTRUCTIONS Medicines  Take over-the-counter and prescription medicines only as told by your health care provider.  Do not take aspirin to relieve pain if gout is suspected. Activities  Rest your joint if told by your health care provider. Rest is important when your disease is active and your joint feels painful, swollen, or stiff.  Avoid activities that make the pain worse. It is important to balance activity with rest.  Exercise your joint regularly with range-of-motion exercises as told by your health care provider. Try doing low-impact exercise, such as:  Swimming.  Water aerobics.  Biking.  Walking. Joint Care  If your joint is swollen, keep it elevated if told by your health care provider.  If your joint feels stiff in the morning, try taking a warm shower.  If directed, apply heat to the joint. If you have diabetes, do not apply heat without permission from your health care provider.  Put a towel between the joint and the hot pack or heating pad.  Leave the heat on the area for 20-30 minutes.  If directed, apply ice to the joint:  Put ice in a plastic bag.  Place a towel between your skin and the bag.  Leave the ice on for 20 minutes, 2-3 times per day.  Keep all follow-up visits as told by your health care provider. This  is important. SEEK MEDICAL CARE IF:  The pain gets worse.  You have a fever. SEEK IMMEDIATE MEDICAL CARE IF:  You develop severe joint pain, swelling, or redness.  Many joints become painful and swollen.  You develop severe back pain.  You develop severe weakness in your leg.  You cannot control your bladder or bowels.   This information is not intended to replace advice given to you by your health care provider. Make sure you discuss any questions you have with your health care  provider.   Document Released: 02/14/2004 Document Revised: 09/27/2014 Document Reviewed: 04/03/2014 Elsevier Interactive Patient Education [employer]

## 2015-03-09 NOTE — ED Notes (Signed)
Patient transported to X-ray 

## 2015-03-09 NOTE — ED Notes (Signed)
C/o left knee pain since tues, no trauma, A/O X4, ambulatory and in NAD

## 2015-03-16 DIAGNOSIS — G43009 Migraine without aura, not intractable, without status migrainosus: Secondary | ICD-10-CM | POA: Insufficient documentation

## 2015-03-16 DIAGNOSIS — G4733 Obstructive sleep apnea (adult) (pediatric): Secondary | ICD-10-CM | POA: Insufficient documentation

## 2015-03-16 DIAGNOSIS — E119 Type 2 diabetes mellitus without complications: Secondary | ICD-10-CM | POA: Insufficient documentation

## 2015-03-16 DIAGNOSIS — M545 Low back pain, unspecified: Secondary | ICD-10-CM | POA: Insufficient documentation

## 2015-03-16 DIAGNOSIS — R208 Other disturbances of skin sensation: Secondary | ICD-10-CM | POA: Insufficient documentation

## 2015-03-16 DIAGNOSIS — M5136 Other intervertebral disc degeneration, lumbar region: Secondary | ICD-10-CM | POA: Insufficient documentation

## 2015-03-16 DIAGNOSIS — F32A Depression, unspecified: Secondary | ICD-10-CM | POA: Insufficient documentation

## 2015-03-16 DIAGNOSIS — E113292 Type 2 diabetes mellitus with mild nonproliferative diabetic retinopathy without macular edema, left eye: Secondary | ICD-10-CM | POA: Insufficient documentation

## 2015-03-16 DIAGNOSIS — I10 Essential (primary) hypertension: Secondary | ICD-10-CM | POA: Insufficient documentation

## 2015-03-19 DIAGNOSIS — E291 Testicular hypofunction: Secondary | ICD-10-CM | POA: Insufficient documentation

## 2015-03-20 DIAGNOSIS — M542 Cervicalgia: Secondary | ICD-10-CM | POA: Insufficient documentation

## 2015-03-20 DIAGNOSIS — R200 Anesthesia of skin: Secondary | ICD-10-CM | POA: Insufficient documentation

## 2015-03-20 DIAGNOSIS — E1140 Type 2 diabetes mellitus with diabetic neuropathy, unspecified: Secondary | ICD-10-CM | POA: Insufficient documentation

## 2015-03-21 DIAGNOSIS — R300 Dysuria: Secondary | ICD-10-CM | POA: Insufficient documentation

## 2015-03-29 DIAGNOSIS — G5602 Carpal tunnel syndrome, left upper limb: Secondary | ICD-10-CM | POA: Insufficient documentation

## 2015-04-20 DIAGNOSIS — H04123 Dry eye syndrome of bilateral lacrimal glands: Secondary | ICD-10-CM | POA: Insufficient documentation

## 2015-04-20 DIAGNOSIS — E1136 Type 2 diabetes mellitus with diabetic cataract: Secondary | ICD-10-CM | POA: Insufficient documentation

## 2015-04-20 DIAGNOSIS — H5213 Myopia, bilateral: Secondary | ICD-10-CM | POA: Insufficient documentation

## 2015-04-20 DIAGNOSIS — H1045 Other chronic allergic conjunctivitis: Secondary | ICD-10-CM | POA: Insufficient documentation

## 2015-04-20 DIAGNOSIS — Q120 Congenital cataract: Secondary | ICD-10-CM | POA: Insufficient documentation

## 2015-04-20 DIAGNOSIS — IMO0002 Reserved for concepts with insufficient information to code with codable children: Secondary | ICD-10-CM | POA: Insufficient documentation

## 2015-05-01 DIAGNOSIS — M5414 Radiculopathy, thoracic region: Secondary | ICD-10-CM | POA: Insufficient documentation

## 2015-05-03 ENCOUNTER — Encounter (HOSPITAL_COMMUNITY): Payer: Self-pay

## 2015-05-03 ENCOUNTER — Emergency Department (HOSPITAL_COMMUNITY)
Admission: EM | Admit: 2015-05-03 | Discharge: 2015-05-03 | Disposition: A | Discharge status: Home / Self Care | Payer: Self-pay | Attending: Emergency Medicine | Admitting: Emergency Medicine

## 2015-05-03 ENCOUNTER — Emergency Department (HOSPITAL_COMMUNITY): Payer: Self-pay

## 2015-05-03 DIAGNOSIS — R0781 Pleurodynia: Secondary | ICD-10-CM | POA: Insufficient documentation

## 2015-05-03 DIAGNOSIS — E119 Type 2 diabetes mellitus without complications: Secondary | ICD-10-CM | POA: Insufficient documentation

## 2015-05-03 DIAGNOSIS — M546 Pain in thoracic spine: Secondary | ICD-10-CM | POA: Insufficient documentation

## 2015-05-03 DIAGNOSIS — Z7984 Long term (current) use of oral hypoglycemic drugs: Secondary | ICD-10-CM | POA: Insufficient documentation

## 2015-05-03 DIAGNOSIS — G629 Polyneuropathy, unspecified: Secondary | ICD-10-CM | POA: Insufficient documentation

## 2015-05-03 DIAGNOSIS — F17200 Nicotine dependence, unspecified, uncomplicated: Secondary | ICD-10-CM | POA: Insufficient documentation

## 2015-05-03 DIAGNOSIS — E7800 Pure hypercholesterolemia, unspecified: Secondary | ICD-10-CM | POA: Insufficient documentation

## 2015-05-03 DIAGNOSIS — Z79899 Other long term (current) drug therapy: Secondary | ICD-10-CM | POA: Insufficient documentation

## 2015-05-03 DIAGNOSIS — Z794 Long term (current) use of insulin: Secondary | ICD-10-CM | POA: Insufficient documentation

## 2015-05-03 LAB — I-STAT TROPONIN, ED: Troponin i, poc: 0 ng/mL (ref 0.00–0.08)

## 2015-05-03 LAB — CBG MONITORING, ED: GLUCOSE-CAPILLARY: 108 mg/dL — AB (ref 65–99)

## 2015-05-03 IMAGING — DX DG CHEST 2V
2 series · 2 of 2 positions shown · non-contrast
Comparison: [DATE].

CLINICAL DATA: Left back and rib pain for 2 weeks.

EXAM:
CHEST  2 VIEW

[chest pa]
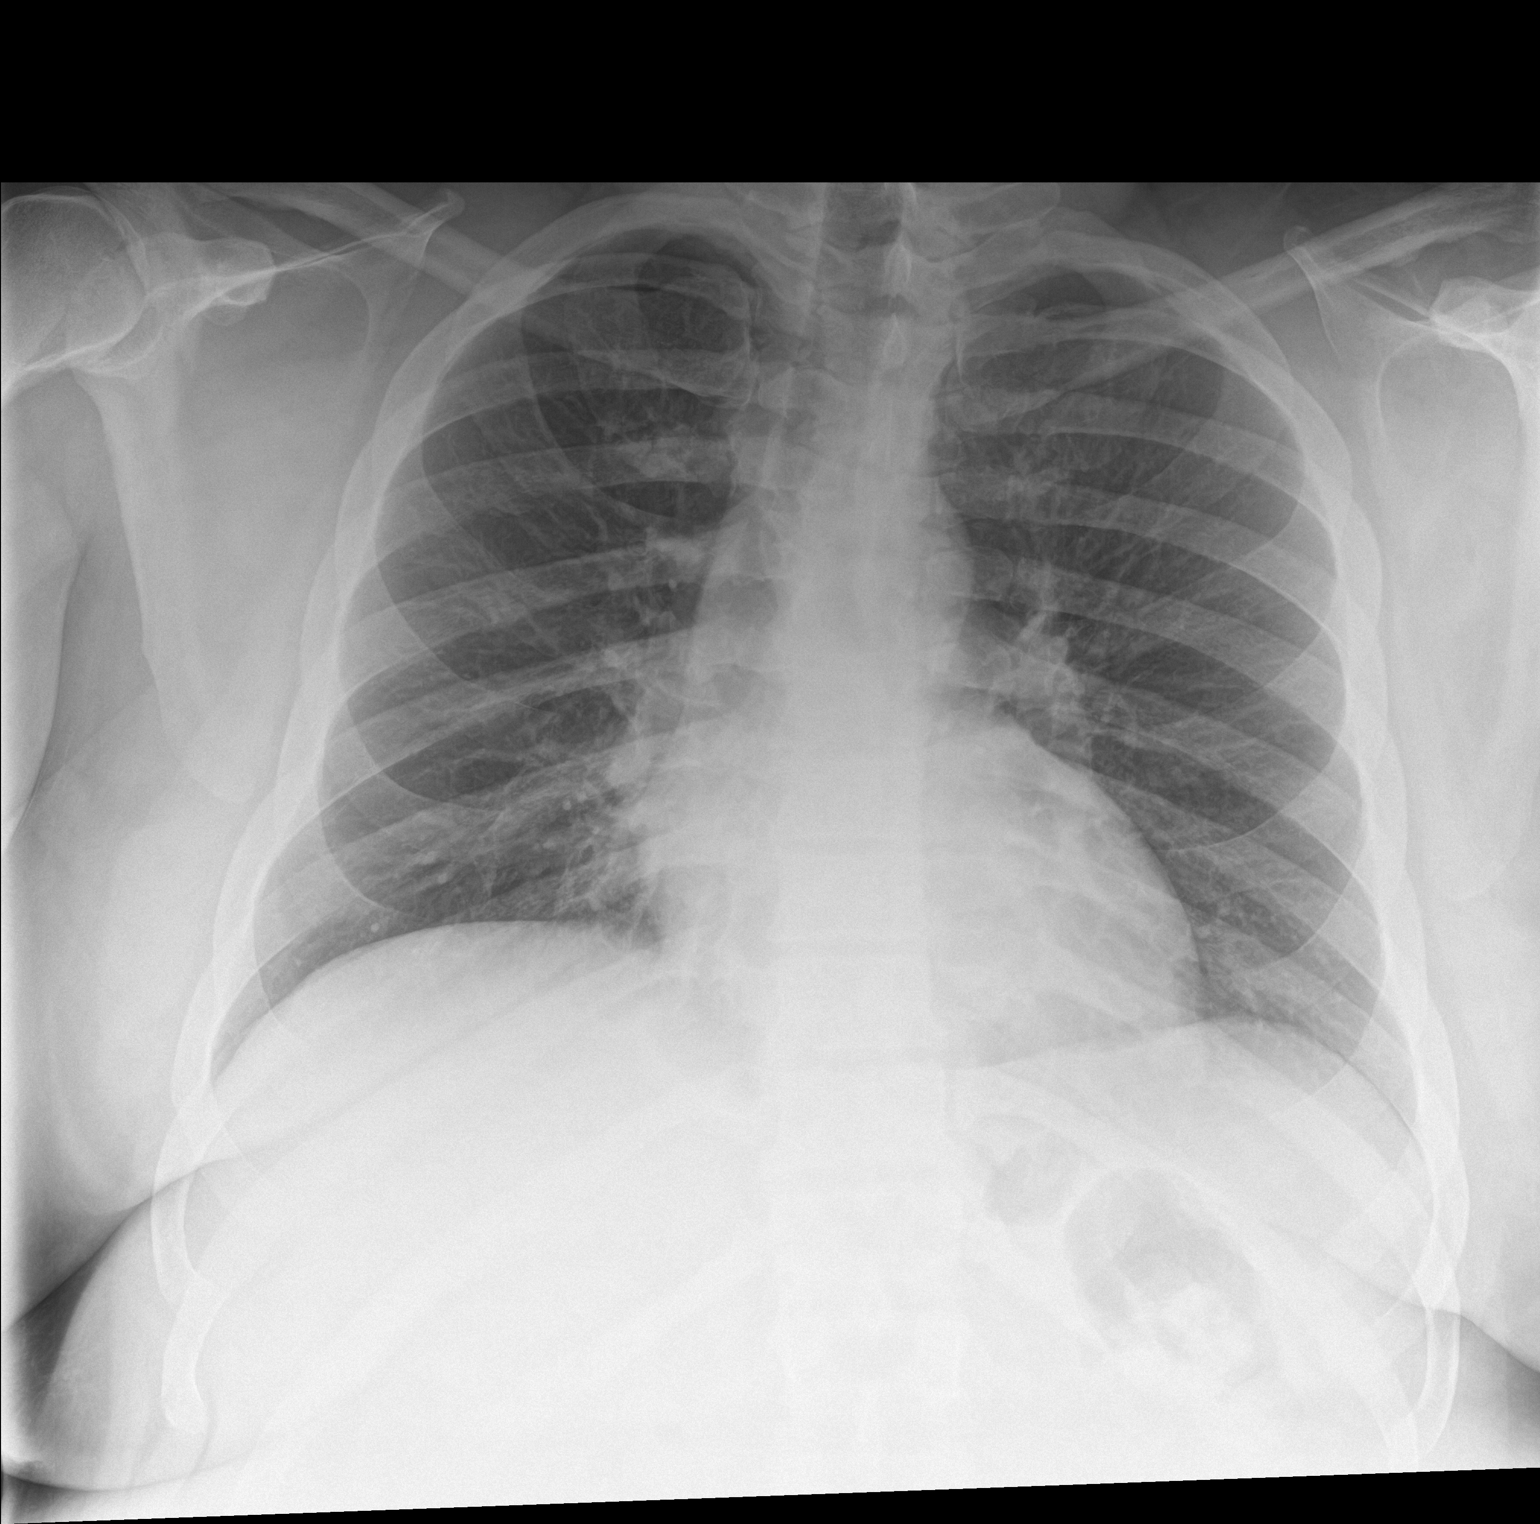

[chest lat]
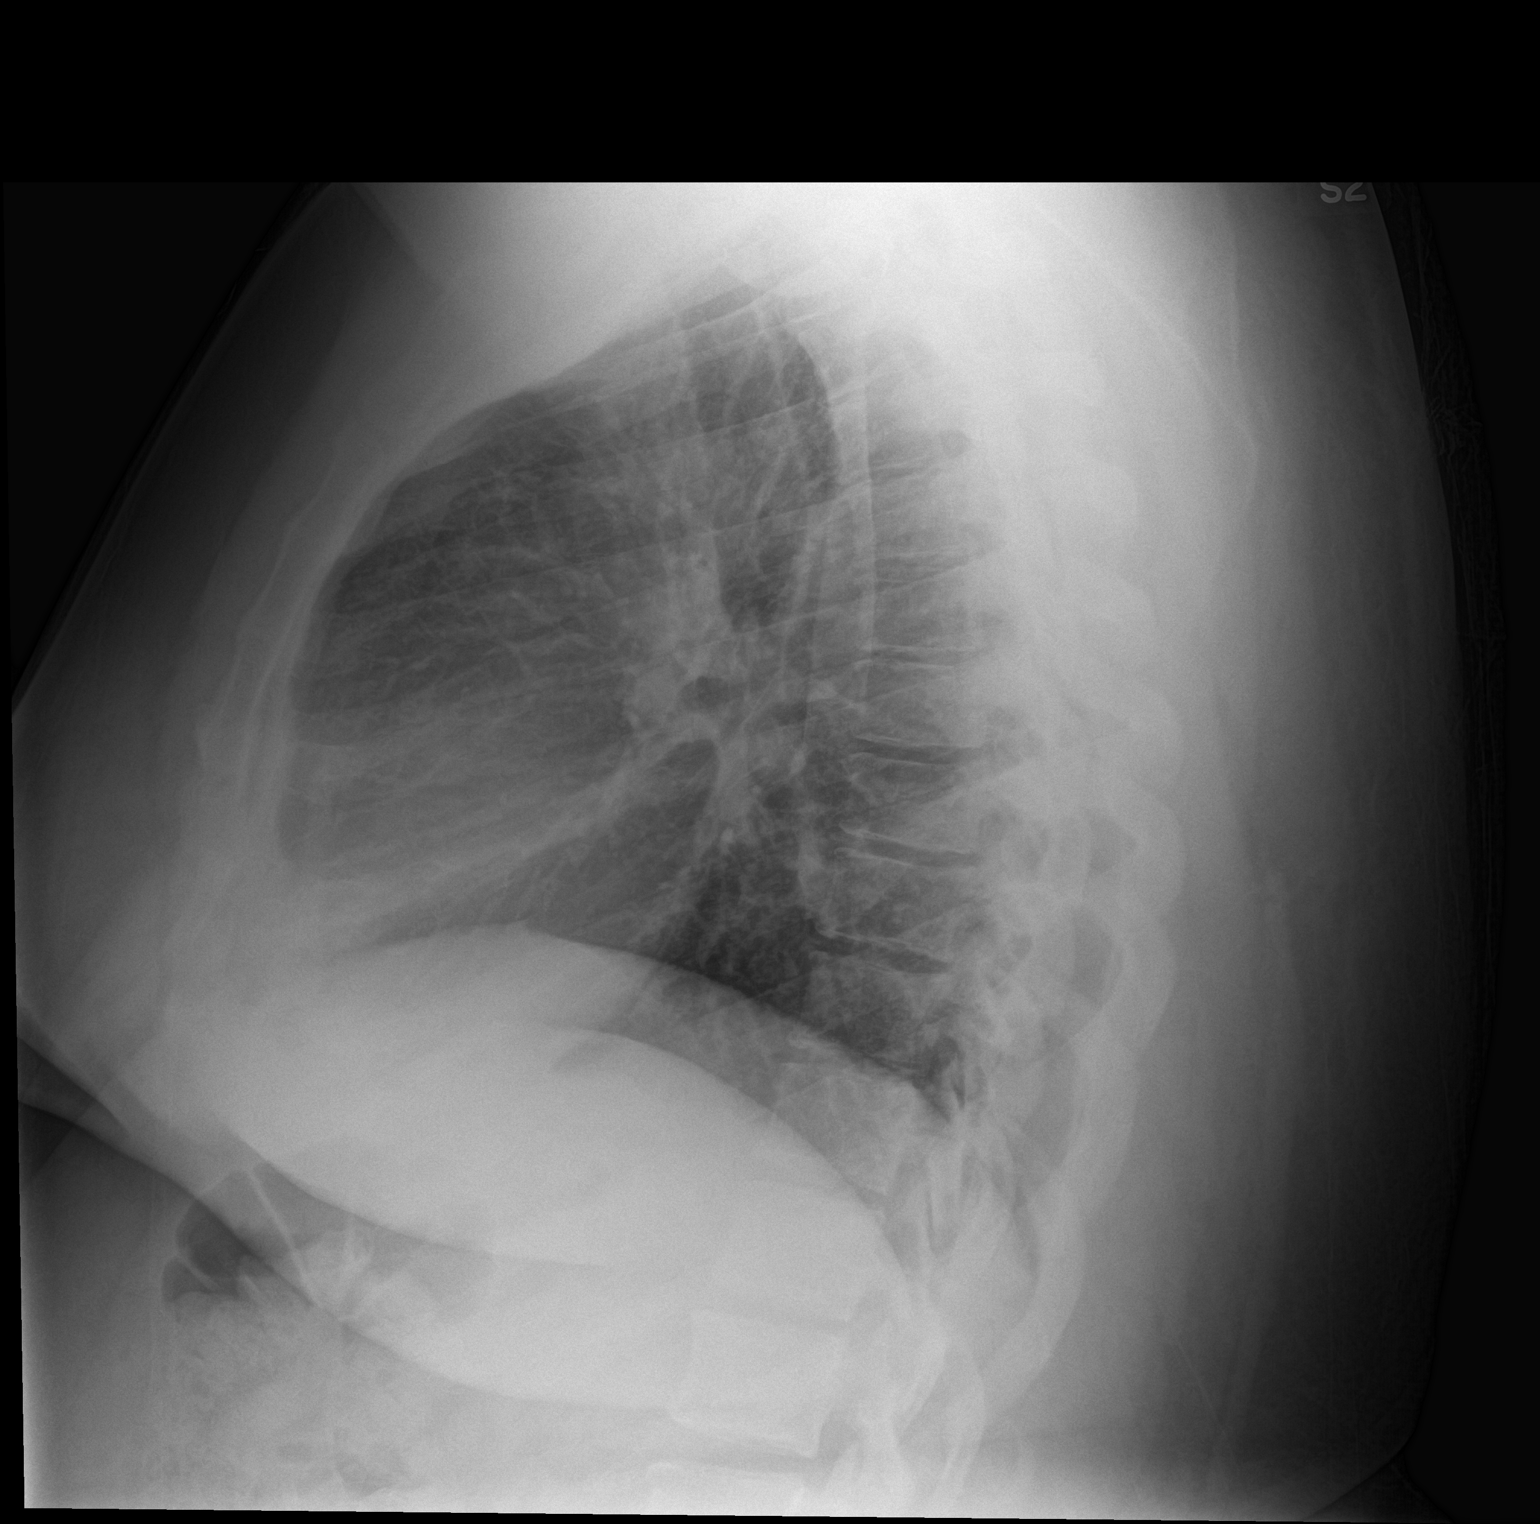

[2 of 2 positions shown; findings below may reference images not displayed]

FINDINGS: The heart size and mediastinal contours are within normal limits.
Both lungs are clear. No pneumothorax or pleural effusion is noted.
The visualized skeletal structures are unremarkable.
IMPRESSION: No active cardiopulmonary disease.

## 2015-05-03 MED ORDER — PREDNISONE 20 MG PO TABS: 40.0000 mg | ORAL_TABLET | Freq: Every day | ORAL | Status: DC

## 2015-05-03 MED ORDER — OXYCODONE-ACETAMINOPHEN 5-325 MG PO TABS
2.0000 | ORAL_TABLET | Freq: Once | ORAL | Status: AC
  Administered 2015-05-03: 2 via ORAL
  Filled 2015-05-03: qty 2

## 2015-05-03 MED ORDER — OXYCODONE-ACETAMINOPHEN 5-325 MG PO TABS: 1.0000 | ORAL_TABLET | ORAL | Status: DC | PRN

## 2015-05-03 MED ORDER — CYCLOBENZAPRINE HCL 10 MG PO TABS: 10.0000 mg | ORAL_TABLET | Freq: Two times a day (BID) | ORAL | Status: DC | PRN

## 2015-05-03 NOTE — ED Notes (Signed)
Patient here with spinal pain hat originates at his left flank and radiates to left ribs. Pain x 2 weeks. Has seen neurologist for same and to have MRI. Reports that the pain has increased. Severe with movement

## 2015-05-03 NOTE — Discharge Instructions (Signed)
Chest Wall Pain Chest wall pain is pain in or around the bones and muscles of your chest. Sometimes, an injury causes this pain. Sometimes, the cause may not be known. This pain may take several weeks or longer to get better. HOME CARE INSTRUCTIONS  Pay attention to any changes in your symptoms. Take these actions to help with your pain:   Rest as told by your health care provider.   Avoid activities that cause pain. These include any activities that use your chest muscles or your abdominal and side muscles to lift heavy items.   If directed, apply ice to the painful area:  Put ice in a plastic bag.  Place a towel between your skin and the bag.  Leave the ice on for 20 minutes, 2-3 times per day.  Take over-the-counter and prescription medicines only as told by your health care provider.  Do not use tobacco products, including cigarettes, chewing tobacco, and e-cigarettes. If you need help quitting, ask your health care provider.  Keep all follow-up visits as told by your health care provider. This is important. SEEK MEDICAL CARE IF:  You have a fever.  Your chest pain becomes worse.  You have new symptoms. SEEK IMMEDIATE MEDICAL CARE IF:  You have nausea or vomiting.  You feel sweaty or light-headed.  You have a cough with phlegm (sputum) or you cough up blood.  You develop shortness of breath.   This information is not intended to replace advice given to you by your health care provider. Make sure you discuss any questions you have with your health care provider.   Document Released: 01/06/2005 Document Revised: 09/27/2014 Document Reviewed: 04/03/2014 Elsevier Interactive Patient Education 2016 [employer]   Radicular Pain Radicular pain in either the arm or leg is usually from a bulging or herniated disk in the spine. A piece of the herniated disk may press against the nerves as the nerves exit the spine. This causes pain which is felt at the tips of the nerves  down the arm or leg. Other causes of radicular pain may include:  Fractures.  Heart disease.  Cancer.  An abnormal and usually degenerative state of the nervous system or nerves (neuropathy). Diagnosis may require CT or MRI scanning to determine the primary cause.  Nerves that start at the neck (nerve roots) may cause radicular pain in the outer shoulder and arm. It can spread down to the thumb and fingers. The symptoms vary depending on which nerve root has been affected. In most cases radicular pain improves with conservative treatment. Neck problems may require physical therapy, a neck collar, or cervical traction. Treatment may take many weeks, and surgery may be considered if the symptoms do not improve.  Conservative treatment is also recommended for sciatica. Sciatica causes pain to radiate from the lower back or buttock area down the leg into the foot. Often there is a history of back problems. Most patients with sciatica are better after 2 to 4 weeks of rest and other supportive care. Short term bed rest can reduce the disk pressure considerably. Sitting, however, is not a good position since this increases the pressure on the disk. You should avoid bending, lifting, and all other activities which make the problem worse. Traction can be used in severe cases. Surgery is usually reserved for patients who do not improve within the first months of treatment. Only take over-the-counter or prescription medicines for pain, discomfort, or fever as directed by your caregiver. Narcotics and muscle relaxants  may help by relieving more severe pain and spasm and by providing mild sedation. Cold or massage can give significant relief. Spinal manipulation is not recommended. It can increase the degree of disc protrusion. Epidural steroid injections are often effective treatment for radicular pain. These injections deliver medicine to the spinal nerve in the space between the protective covering of the spinal  cord and back bones (vertebrae). Your caregiver can give you more information about steroid injections. These injections are most effective when given within two weeks of the onset of pain.  You should see your caregiver for follow up care as recommended. A program for neck and back injury rehabilitation with stretching and strengthening exercises is an important part of management.  SEEK IMMEDIATE MEDICAL CARE IF:  You develop increased pain, weakness, or numbness in your arm or leg.  You develop difficulty with bladder or bowel control.  You develop abdominal pain.   This information is not intended to replace advice given to you by your health care provider. Make sure you discuss any questions you have with your health care provider.   Document Released: 02/14/2004 Document Revised: 01/27/2014 Document Reviewed: 08/02/2014 Elsevier Interactive Patient Education [employer]

## 2015-05-03 NOTE — ED Provider Notes (Signed)
CSN: [id number]     Arrival date & time 05/03/15  4098 History   First MD Initiated Contact with Patient 05/03/15 0901     Chief Complaint  Patient presents with  . Flank Pain     (Consider location/radiation/quality/duration/timing/severity/associated sxs/prior Treatment) HPI   Barry [name] is a 40 y.o. male with PMHx of obesity, DM, hypercholesteremia, back pain, neuropathy, is on narcotic pain medication chronically (norco 7.5 TID), was seen by neurology 2 days ago and was diagnosed with thoracic radiculopathy and has a thoracic MRI ordered but has not yet been able to have the scan performed. He presents to the emergency department with the same pain that has been there for the past 2 weeks, is located in the left mid back with radiation around his left side and ribs, is worse with palpation, movement and deep inspiration.  He has no relief with his home pain meds. He states that pain is sharp, stabbing and over the past 2 days has been associated with a worsening "pressure."  Last night he had sweats that he denies any fever, cough, wheeze, chills, shortness of breath.  He also denies any recent URI illness but states that 4 weeks ago he was diagnosed with flu his symptoms were fever and body aches.  He denies rash. He denies any strain to his back or ribs.   Past Medical History  Diagnosis Date  . Obesity   . Diabetes mellitus without complication (HCC)   . Hypercholesteremia    Past Surgical History  Procedure Laterality Date  . Tonsillectomy    . Incise and drain abcess     No family history on file. Social History  Substance Use Topics  . Smoking status: Current Every Day Smoker  . Smokeless tobacco: None  . Alcohol Use: Yes    Review of Systems  Constitutional: Negative.   HENT: Negative.   Respiratory: Negative.  Negative for cough, chest tightness, shortness of breath and wheezing.   Cardiovascular: Negative.  Negative for chest pain, palpitations and leg swelling.   Gastrointestinal: Negative.   Musculoskeletal: Positive for back pain. Negative for myalgias, joint swelling and neck pain.  Skin: Negative.  Negative for color change and rash.  Psychiatric/Behavioral: Negative.       Allergies  Review of patient's allergies indicates no known allergies.  Home Medications   Prior to Admission medications   Medication Sig Start Date End Date Taking? Authorizing Provider  atorvastatin (LIPITOR) 10 MG tablet Take 10 mg by mouth daily.   Yes Historical Provider, MD  canagliflozin (INVOKANA) 300 MG TABS tablet Take 300 mg by mouth daily before breakfast.   Yes Historical Provider, MD  exenatide (BYETTA) 10 MCG/0.04ML SOPN injection Inject 10 mcg into the skin 2 (two) times daily with a meal.   Yes Historical Provider, MD  HYDROcodone-acetaminophen (NORCO) 7.5-325 MG tablet Take 1 tablet by mouth every 8 (eight) hours as needed for moderate pain.   Yes Historical Provider, MD  insulin NPH-regular Human (NOVOLIN 70/30) (70-30) 100 UNIT/ML injection Inject 60 Units into the skin 2 (two) times daily with a meal.   Yes Historical Provider, MD  levothyroxine (SYNTHROID, LEVOTHROID) 75 MCG tablet Take 75 mcg by mouth daily before breakfast.   Yes Historical Provider, MD  lisinopril (PRINIVIL,ZESTRIL) 10 MG tablet Take 10 mg by mouth daily.   Yes Historical Provider, MD  metFORMIN (GLUCOPHAGE) 1000 MG tablet Take 1,000 mg by mouth 2 (two) times daily with a meal.   Yes Historical Provider,  MD  morphine (MS CONTIN) 15 MG 12 hr tablet Take 15 mg by mouth every 12 (twelve) hours.   Yes Historical Provider, MD  pregabalin (LYRICA) 200 MG capsule Take 200-400 mg by mouth 2 (two) times daily. Take  in the morning Take  in the evening   Yes Historical Provider, MD  testosterone cypionate (DEPOTESTOSTERONE CYPIONATE) 200 MG/ML injection Inject 200 mg into the muscle every 14 (fourteen) days.   Yes Historical Provider, MD   BP 140/83 mmHg  Pulse 97  Temp(Src)  97.8 F (36.6 C) (Oral)  Resp 18  Ht  (1.93 m)  Wt 162.841 kg  BMI 43.72 kg/m2  SpO2 97% Physical Exam  Constitutional: He is oriented to person, place, and time. He appears well-developed and well-nourished. No distress.  Morbidly obese male, appears uncomfortable  HENT:  Head: Normocephalic and atraumatic.  Nose: Nose normal.  Mouth/Throat: Oropharynx is clear and moist. No oropharyngeal exudate.  Eyes: Conjunctivae and EOM are normal. Pupils are equal, round, and reactive to light. Right eye exhibits no discharge. Left eye exhibits no discharge. No scleral icterus.  Neck: Normal range of motion. No JVD present. No tracheal deviation present. No thyromegaly present.  Cardiovascular: Normal rate, regular rhythm, normal heart sounds and intact distal pulses.  Exam reveals no gallop and no friction rub.   No murmur heard. Pulses:      Radial pulses are 2+ on the right side, and 2+ on the left side.  Pulmonary/Chest: Effort normal. No accessory muscle usage. No respiratory distress. He has no wheezes. He has no rhonchi. He has rales in the left lower field.   He exhibits tenderness. He exhibits no edema, no swelling and no retraction.    Splinted inspiratory effort, no wheeze, no rhonchi, rales at the left lower lung fields with deep inspiration, ttp to left thoracic back to left side and to left anterior lower ribs.  No rash  Abdominal: Soft. Bowel sounds are normal. He exhibits no distension and no mass. There is no tenderness. There is no rebound and no guarding.  Musculoskeletal: Normal range of motion. He exhibits no edema or tenderness.  Lymphadenopathy:    He has no cervical adenopathy.  Neurological: He is alert and oriented to person, place, and time. He has normal reflexes. No cranial nerve deficit. He exhibits normal muscle tone. Coordination normal.  Skin: Skin is warm and dry. No rash noted. He is not diaphoretic. No erythema. No pallor.  Psychiatric: He has a normal  mood and affect. His behavior is normal. Judgment and thought content normal.  Nursing note and vitals reviewed.   ED Course  Procedures (including critical care time) Labs Review Labs Reviewed  CBG MONITORING, ED - Abnormal; Notable for the following:    Glucose-Capillary 108 (*)    All other components within normal limits   Results for orders placed or performed during the hospital encounter of 05/03/15  CBG monitoring, ED  Result Value Ref Range   Glucose-Capillary 108 (H) 65 - 99 mg/dL  I-stat troponin, ED  Result Value Ref Range   Troponin i, poc 0.00 0.00 - 0.08 ng/mL   Comment 3           Dg Chest 2 View  05/03/2015  CLINICAL DATA:  Left back and rib pain for 2 weeks. EXAM: CHEST  2 VIEW COMPARISON:  July 08, 2014. FINDINGS: The heart size and mediastinal contours are within normal limits. Both lungs are clear. No pneumothorax or pleural effusion  is noted. The visualized skeletal structures are unremarkable. IMPRESSION: No active cardiopulmonary disease. Electronically Signed   By: Lupita RaiderJames  Green Jr, M.D.   On: 05/03/2015 09:44      Imaging Review No results found. I have personally reviewed and evaluated these images and lab results as part of my medical decision-making.   EKG Interpretation   Date/Time:  Thursday May 03 2015 10:27:31 EDT Ventricular Rate:  89 PR Interval:  153 QRS Duration: 109 QT Interval:  358 QTC Calculation: 436 R Axis:   25 Text Interpretation:  Sinus rhythm normal interval normal axis  No old  tracing to compare Confirmed by Rhunette CroftNANAVATI, MD, Janey GentaANKIT [phone] on 05/03/2015  10:54:53 AM      MDM   Patient with back/rib pain.  No neurological deficits and normal neuro exam. Lungs CTA, CXR negative for pneumothorax, PNA.  Pt PERC negative.  Pain consistently reproduced with palpation.  Rib pain non-concerning for ACS, EKG NSR, troponin negative. RICE protocol and pain medicine indicated and discussed with patient.   Final diagnoses:  Rib  pain on left side      Danelle BerryLeisa Regina Coppolino, PA-C 05/13/15 2358  Derwood KaplanAnkit Nanavati, MD 05/14/15 0010

## 2015-06-09 ENCOUNTER — Encounter (HOSPITAL_COMMUNITY): Payer: Self-pay | Admitting: Emergency Medicine

## 2015-06-09 ENCOUNTER — Emergency Department (HOSPITAL_COMMUNITY)
Admission: EM | Admit: 2015-06-09 | Discharge: 2015-06-09 | Disposition: A | Discharge status: Home / Self Care | Payer: Medicaid Other | Attending: Emergency Medicine | Admitting: Emergency Medicine

## 2015-06-09 DIAGNOSIS — E119 Type 2 diabetes mellitus without complications: Secondary | ICD-10-CM | POA: Insufficient documentation

## 2015-06-09 DIAGNOSIS — Z87891 Personal history of nicotine dependence: Secondary | ICD-10-CM | POA: Insufficient documentation

## 2015-06-09 DIAGNOSIS — Z79899 Other long term (current) drug therapy: Secondary | ICD-10-CM | POA: Insufficient documentation

## 2015-06-09 DIAGNOSIS — R0789 Other chest pain: Secondary | ICD-10-CM | POA: Insufficient documentation

## 2015-06-09 DIAGNOSIS — Z794 Long term (current) use of insulin: Secondary | ICD-10-CM | POA: Insufficient documentation

## 2015-06-09 DIAGNOSIS — G8929 Other chronic pain: Secondary | ICD-10-CM | POA: Insufficient documentation

## 2015-06-09 DIAGNOSIS — E7800 Pure hypercholesterolemia, unspecified: Secondary | ICD-10-CM | POA: Insufficient documentation

## 2015-06-09 DIAGNOSIS — Z7984 Long term (current) use of oral hypoglycemic drugs: Secondary | ICD-10-CM | POA: Insufficient documentation

## 2015-06-09 DIAGNOSIS — Z79891 Long term (current) use of opiate analgesic: Secondary | ICD-10-CM | POA: Insufficient documentation

## 2015-06-09 DIAGNOSIS — M545 Low back pain: Secondary | ICD-10-CM

## 2015-06-09 MED ORDER — KETOROLAC TROMETHAMINE 60 MG/2ML IM SOLN
60.0000 mg | Freq: Once | INTRAMUSCULAR | Status: AC
  Administered 2015-06-09: 60 mg via INTRAMUSCULAR
  Filled 2015-06-09: qty 2

## 2015-06-09 NOTE — ED Notes (Signed)
Pt states he is been having 9/10 lower back pain for the past 2 months getting worse today, pt is supposed to have a MRI of his back schedule by his PCP, but he is not able to do it due to insurance problems.

## 2015-06-09 NOTE — Discharge Instructions (Signed)
Your EKG today was normal.  Please take your pain medication as prescribed. Please follow up with your outpatient providers.  If you develop worsening pain, new numbness or weakness in your extremities, are unable to hold your bowel or bladder, cannot empty her bladder, have fever with increasing back pain, please return to the hospital.   Back Pain, Adult Back pain is very common in adults.The cause of back pain is rarely dangerous and the pain often gets better over time.The cause of your back pain may not be known. Some common causes of back pain include:  Strain of the muscles or ligaments supporting the spine.  Wear and tear (degeneration) of the spinal disks.  Arthritis.  Direct injury to the back. For many people, back pain may return. Since back pain is rarely dangerous, most people can learn to manage this condition on their own. HOME CARE INSTRUCTIONS Watch your back pain for any changes. The following actions may help to lessen any discomfort you are feeling:  Remain active. It is stressful on your back to sit or stand in one place for long periods of time. Do not sit, drive, or stand in one place for more than 30 minutes at a time. Take short walks on even surfaces as soon as you are able.Try to increase the length of time you walk each day.  Exercise regularly as directed by your health care provider. Exercise helps your back heal faster. It also helps avoid future injury by keeping your muscles strong and flexible.  Do not stay in bed.Resting more than 1-2 days can delay your recovery.  Pay attention to your body when you bend and lift. The most comfortable positions are those that put less stress on your recovering back. Always use proper lifting techniques, including:  Bending your knees.  Keeping the load close to your body.  Avoiding twisting.  Find a comfortable position to sleep. Use a firm mattress and lie on your side with your knees slightly bent. If you  lie on your back, put a pillow under your knees.  Avoid feeling anxious or stressed.Stress increases muscle tension and can worsen back pain.It is important to recognize when you are anxious or stressed and learn ways to manage it, such as with exercise.  Take medicines only as directed by your health care provider. Over-the-counter medicines to reduce pain and inflammation are often the most helpful.Your health care provider may prescribe muscle relaxant drugs.These medicines help dull your pain so you can more quickly return to your normal activities and healthy exercise.  Apply ice to the injured area:  Put ice in a plastic bag.  Place a towel between your skin and the bag.  Leave the ice on for 20 minutes, 2-3 times a day for the first 2-3 days. After that, ice and heat may be alternated to reduce pain and spasms.  Maintain a healthy weight. Excess weight puts extra stress on your back and makes it difficult to maintain good posture. SEEK MEDICAL CARE IF:  You have pain that is not relieved with rest or medicine.  You have increasing pain going down into the legs or buttocks.  You have pain that does not improve in one week.  You have night pain.  You lose weight.  You have a fever or chills. SEEK IMMEDIATE MEDICAL CARE IF:   You develop new bowel or bladder control problems.  You have unusual weakness or numbness in your arms or legs.  You develop nausea or  vomiting.  You develop abdominal pain.  You feel faint.   This information is not intended to replace advice given to you by your health care provider. Make sure you discuss any questions you have with your health care provider.   Document Released: 01/06/2005 Document Revised: 01/27/2014 Document Reviewed: 05/10/2013 Elsevier Interactive Patient Education 2016 Elsevier Inc[employer]  Chest Wall Pain Chest wall pain is pain in or around the bones and muscles of your chest. Sometimes, an injury causes this pain.  Sometimes, the cause may not be known. This pain may take several weeks or longer to get better. HOME CARE INSTRUCTIONS  Pay attention to any changes in your symptoms. Take these actions to help with your pain:   Rest as told by your health care provider.   Avoid activities that cause pain. These include any activities that use your chest muscles or your abdominal and side muscles to lift heavy items.   If directed, apply ice to the painful area:  Put ice in a plastic bag.  Place a towel between your skin and the bag.  Leave the ice on for 20 minutes, 2-3 times per day.  Take over-the-counter and prescription medicines only as told by your health care provider.  Do not use tobacco products, including cigarettes, chewing tobacco, and e-cigarettes. If you need help quitting, ask your health care provider.  Keep all follow-up visits as told by your health care provider. This is important. SEEK MEDICAL CARE IF:  You have a fever.  Your chest pain becomes worse.  You have new symptoms. SEEK IMMEDIATE MEDICAL CARE IF:  You have nausea or vomiting.  You feel sweaty or light-headed.  You have a cough with phlegm (sputum) or you cough up blood.  You develop shortness of breath.   This information is not intended to replace advice given to you by your health care provider. Make sure you discuss any questions you have with your health care provider.   Document Released: 01/06/2005 Document Revised: 09/27/2014 Document Reviewed: 04/03/2014 Elsevier Interactive Patient Education [employer] Inc[employer]

## 2015-06-09 NOTE — ED Provider Notes (Signed)
TIME SEEN: 5:40 AM  CHIEF COMPLAINT: Back pain, chest pain  HPI: Pt is a 40 y.o. male with history of morbid obesity, insulin-dependent diabetes, hyperlipidemia who presents to the emergency department complaints of chronic back pain throughout the lower lumbar spine for the past 2 years. Worse over the past 2 months and worse with movement especially bending over. Denies any known injury to the back. States it is from "wear and tear". Has history of neuropathy and has chronic tingling in his bilateral lower extremity that is unchanged. No new numbness or focal weakness. No bowel or bladder incontinence. No urinary retention. No fever. No history of epidural injections, IV drug abuse, cancer, unexplained weight loss. He does see a neurologist with [facility]. He has been take Vicodin and extended release morphine. Reports his pain is worsening. States he is supposed to have an MRI of his lumbar spine but is having problems with his insurance and cannot afford to have it done.   Patient also complaining of left-sided chest pain that he has had for over one month. Was seen in the emergency department April 13 for the same and had negative EKG, chest x-ray and negative troponin. States his pain is unchanged. He states it hurts to move and to palpate. States because of this pain he feels short of breath. No fevers or cough. No history of PE, DVT, exogenous estrogen use, fracture, surgery, trauma, hospitalization, prolonged travel. No lower extremity swelling or pain. No calf tenderness.   ROS: See HPI Constitutional: no fever  Eyes: no drainage  ENT: no runny nose   Cardiovascular:   chest pain  Resp:  SOB  GI: no vomiting GU: no dysuria Integumentary: no rash  Allergy: no hives  Musculoskeletal: no leg swelling  Neurological: no slurred speech ROS otherwise negative  PAST MEDICAL HISTORY/PAST SURGICAL HISTORY:  Past Medical History  Diagnosis Date  . Obesity   . Diabetes mellitus  without complication (HCC)   . Hypercholesteremia     MEDICATIONS:  Prior to Admission medications   Medication Sig Start Date End Date Taking? Authorizing Provider  atorvastatin (LIPITOR) 10 MG tablet Take 10 mg by mouth daily.   Yes Historical Provider, MD  canagliflozin (INVOKANA) 300 MG TABS tablet Take 300 mg by mouth daily before breakfast.   Yes Historical Provider, MD  exenatide (BYETTA) 10 MCG/0.04ML SOPN injection Inject 10 mcg into the skin 2 (two) times daily with a meal.   Yes Historical Provider, MD  insulin NPH-regular Human (NOVOLIN 70/30) (70-30) 100 UNIT/ML injection Inject 60 Units into the skin 2 (two) times daily with a meal.   Yes Historical Provider, MD  levothyroxine (SYNTHROID, LEVOTHROID) 75 MCG tablet Take 75 mcg by mouth daily before breakfast.   Yes Historical Provider, MD  lisinopril (PRINIVIL,ZESTRIL) 10 MG tablet Take 10 mg by mouth daily.   Yes Historical Provider, MD  metFORMIN (GLUCOPHAGE) 1000 MG tablet Take 1,000 mg by mouth 2 (two) times daily with a meal.   Yes Historical Provider, MD  morphine (MS CONTIN) 15 MG 12 hr tablet Take 15 mg by mouth every 12 (twelve) hours.   Yes Historical Provider, MD  pregabalin (LYRICA) 200 MG capsule Take 200-400 mg by mouth 2 (two) times daily. Take 200mg  in the morning Take 400mg  in the evening   Yes Historical Provider, MD  testosterone cypionate (DEPOTESTOSTERONE CYPIONATE) 200 MG/ML injection Inject 200 mg into the muscle every 14 (fourteen) days.   Yes Historical Provider, MD  cyclobenzaprine (FLEXERIL) 10  MG tablet Take 1 tablet (10 mg total) by mouth 2 (two) times daily as needed for muscle spasms. Patient not taking: Reported on 06/09/2015 05/03/15   Danelle Berry, PA-C  oxyCODONE-acetaminophen (PERCOCET) 5-325 MG tablet Take 1 tablet by mouth every 4 (four) hours as needed. Patient not taking: Reported on 06/09/2015 05/03/15   Danelle Berry, PA-C  predniSONE (DELTASONE) 20 MG tablet Take 2 tablets (40 mg total) by mouth  daily. Take 40 mg by mouth daily for 3 days, then  by mouth daily for 3 days, then  daily for 3 days Patient not taking: Reported on 06/09/2015 05/03/15   Danelle Berry, PA-C    ALLERGIES:  No Known Allergies  SOCIAL HISTORY:  Social History  Substance Use Topics  . Smoking status: Former Games developer  . Smokeless tobacco: Not on file  . Alcohol Use: Yes    FAMILY HISTORY: History reviewed. No pertinent family history.  EXAM: BP 125/77 mmHg  Pulse 92  Temp(Src) 97.4 F (36.3 C) (Oral)  Resp 19  Ht  (1.905 m)  Wt 359 lb (162.841 kg)  BMI 44.87 kg/m2  SpO2 97% CONSTITUTIONAL: Alert and oriented and responds appropriately to questions. Well-appearing; well-nourished, Morbidly obese, afebrile HEAD: Normocephalic EYES: Conjunctivae clear, PERRL ENT: normal nose; no rhinorrhea; moist mucous membranes NECK: Supple, no meningismus, no LAD  CARD: RRR; S1 and S2 appreciated; no murmurs, no clicks, no rubs, no gallops CHEST:  Tender to palpation over the left chest wall which reproduces his pain without crepitus, ecchymosis, deformity, rash. No flail chest. No subcutaneous emphysema RESP: Normal chest excursion without splinting or tachypnea; breath sounds clear and equal bilaterally; no wheezes, no rhonchi, no rales, no hypoxia or respiratory distress, speaking full sentences ABD/GI: Normal bowel sounds; non-distended; soft, non-tender, no rebound, no guarding, no peritoneal signs BACK:  The back appears normal and is diffusely tender throughout the lower lumbar paraspinal musculature without step-off or deformity, there is no CVA tenderness EXT: Normal ROM in all joints; non-tender to palpation; no edema; normal capillary refill; no cyanosis, no calf tenderness or swelling on extremity is are warm and well-perfused    SKIN: Normal color for age and race; warm; no rash NEURO: Moves all extremities equally, reports decreased sensation in bilateral lower extremities which is chronic but  otherwise sensation to light touch intact diffusely, cranial nerves II through XII intact, no saddle anesthesia, 2+ deep tendon reflexes in bilateral upper and lower extremity, normal gait, no clonus PSYCH: The patient's mood and manner are appropriate. Grooming and personal hygiene are appropriate.  MEDICAL DECISION MAKING: Patient here with chronic back pain. No new neurologic deficit. Doubt cauda equina, spinal stenosis, epidural abscess or hematoma, discitis, transverse myelitis. He is on chronic pain medication at home. At this time he has no one that can drive him home.  We'll give him IM Toradol. I do not feel he needs emergent imaging. No new injury. No midline solid tenderness to suggest a fracture. He is tender throughout the muscles. He is already on Flexeril, morphine, Vicodin. Discussed with him I recommend close of patient follow up with his neurologist I do feel he needs an MRI but not emergently. Discussed at length return precautions.   As for patient's chest pain, this is completely reproducible with palpation of his chest wall. His EKG shows no ischemic abnormality. He has no risk factors for pulmonary embolus. Does have diabetes, obesity and hyperlipidemia but has had this pain for several weeks and was here a month  ago and had a negative troponin in the setting of constant pain. Have offered labs, chest x-ray today which he refuses. Doubt ACS, PE, dissection. No infectious symptoms to suggest pneumonia.    At this time, I do not feel there is any life-threatening condition present. I have reviewed and discussed all results (EKG, imaging, lab, urine as appropriate), exam findings with patient. I have reviewed nursing notes and appropriate previous records.  I feel the patient is safe to be discharged home without further emergent workup. Discussed usual and customary return precautions. Patient and family (if present) verbalize understanding and are comfortable with this plan.  Patient  will follow-up with their primary care provider. If they do not have a primary care provider, information for follow-up has been provided to them. All questions have been answered.     EKG Interpretation  Date/Time:  Saturday Jun 09 2015 05:58:06 EDT Ventricular Rate:  84 PR Interval:  161 QRS Duration: 109 QT Interval:  393 QTC Calculation: 465 R Axis:   2 Text Interpretation:  Sinus rhythm No significant change since last tracing Confirmed by WARD,  DO, KRISTEN [phone] on 06/09/2015 6:33:19 AM             Layla MawKristen N Ward, DO 06/09/15 [id number]

## 2016-04-08 DIAGNOSIS — K8020 Calculus of gallbladder without cholecystitis without obstruction: Secondary | ICD-10-CM

## 2016-04-08 DIAGNOSIS — E1310 Other specified diabetes mellitus with ketoacidosis without coma: Secondary | ICD-10-CM

## 2016-04-09 DIAGNOSIS — E871 Hypo-osmolality and hyponatremia: Secondary | ICD-10-CM

## 2016-05-04 ENCOUNTER — Emergency Department (HOSPITAL_COMMUNITY): Payer: Medicaid Other

## 2016-05-04 ENCOUNTER — Emergency Department (HOSPITAL_COMMUNITY)
Admission: EM | Admit: 2016-05-04 | Discharge: 2016-05-04 | Disposition: A | Discharge status: Home / Self Care | Payer: Medicaid Other | Attending: Emergency Medicine | Admitting: Emergency Medicine

## 2016-05-04 ENCOUNTER — Encounter (HOSPITAL_COMMUNITY): Payer: Self-pay | Admitting: Emergency Medicine

## 2016-05-04 DIAGNOSIS — Z794 Long term (current) use of insulin: Secondary | ICD-10-CM | POA: Diagnosis not present

## 2016-05-04 DIAGNOSIS — E119 Type 2 diabetes mellitus without complications: Secondary | ICD-10-CM | POA: Diagnosis not present

## 2016-05-04 DIAGNOSIS — Z79899 Other long term (current) drug therapy: Secondary | ICD-10-CM | POA: Insufficient documentation

## 2016-05-04 DIAGNOSIS — M25532 Pain in left wrist: Secondary | ICD-10-CM

## 2016-05-04 DIAGNOSIS — Z87891 Personal history of nicotine dependence: Secondary | ICD-10-CM | POA: Insufficient documentation

## 2016-05-04 HISTORY — DX: Carpal tunnel syndrome, unspecified upper limb: G56.00

## 2016-05-04 LAB — I-STAT TROPONIN, ED: Troponin i, poc: 0 ng/mL (ref 0.00–0.08)

## 2016-05-04 NOTE — ED Provider Notes (Signed)
[facility]-EMERGENCY DEPT Provider Note   CSN: [id number] Arrival date & time: 05/04/16  1605     History   Chief Complaint Chief Complaint  Patient presents with  . Wrist Pain    HPI Barry [name] is a 41 y.o. male.  Patient, with past medical history of insulin-dependent diabetes, carpal tunnel syndrome, diabetic neuropathy, presents with left wrist pain and swelling that began this morning described as sharp and radiating up the arm and up to shoulder. He states that this does not feel like his previous episodes of carpal tunnel syndrome. He states that it is painful to move his wrist and it is starting to swell. He denies any injury or trauma to the area. He reports history of surgery to repair boxers fracture in this hand several years ago. Since then it has not given him any issues. He denies any numbness or weakness. He states he has never had any treatments done for his carpal tunnel syndrome. He states he experiences numbness and tingling on his bilateral lower extremities due to his diabetic neuropathy. He states he is unable to afford his Lyrica anymore. Denies chest pain, trouble breathing, recent injury, temperature changes, history of skin infections, history of clots, new medications, rash, nausea, vomiting, pain elsewhere.      Past Medical History:  Diagnosis Date  . Carpal tunnel syndrome   . Diabetes mellitus without complication (HCC)   . Hypercholesteremia   . Obesity     Patient Active Problem List   Diagnosis Date Noted  . HYPOTHYROIDISM 05/31/2008  . DYSLIPIDEMIA 05/31/2008  . OBESITY, MORBID 05/31/2008  . MYOCARDIAL PERFUSION SCAN, WITH STRESS TEST, ABNORMAL 05/31/2008    Past Surgical History:  Procedure Laterality Date  . FRACTURE SURGERY    . INCISE AND DRAIN ABCESS    . TONSILLECTOMY         Home Medications    Prior to Admission medications   Medication Sig Start Date End Date Taking? Authorizing Provider  atorvastatin (LIPITOR) 10 MG  tablet Take 10 mg by mouth daily.    Historical Provider, MD  canagliflozin (INVOKANA) 300 MG TABS tablet Take 300 mg by mouth daily before breakfast.    Historical Provider, MD  cyclobenzaprine (FLEXERIL) 10 MG tablet Take 1 tablet (10 mg total) by mouth 2 (two) times daily as needed for muscle spasms. Patient not taking: Reported on 06/09/2015 05/03/15   Danelle Berry, PA-C  exenatide (BYETTA) 10 MCG/0.04ML SOPN injection Inject 10 mcg into the skin 2 (two) times daily with a meal.    Historical Provider, MD  insulin NPH-regular Human (NOVOLIN 70/30) (70-30) 100 UNIT/ML injection Inject 60 Units into the skin 2 (two) times daily with a meal.    Historical Provider, MD  levothyroxine (SYNTHROID, LEVOTHROID) 75 MCG tablet Take 75 mcg by mouth daily before breakfast.    Historical Provider, MD  lisinopril (PRINIVIL,ZESTRIL) 10 MG tablet Take 10 mg by mouth daily.    Historical Provider, MD  metFORMIN (GLUCOPHAGE) 1000 MG tablet Take 1,000 mg by mouth 2 (two) times daily with a meal.    Historical Provider, MD  morphine (MS CONTIN) 15 MG 12 hr tablet Take 15 mg by mouth every 12 (twelve) hours.    Historical Provider, MD  oxyCODONE-acetaminophen (PERCOCET) 5-325 MG tablet Take 1 tablet by mouth every 4 (four) hours as needed. Patient not taking: Reported on 06/09/2015 05/03/15   Danelle Berry, PA-C  predniSONE (DELTASONE) 20 MG tablet Take 2 tablets (40 mg total) by mouth daily.  Take 40 mg by mouth daily for 3 days, then  by mouth daily for 3 days, then  daily for 3 days Patient not taking: Reported on 06/09/2015 05/03/15   Danelle Berry, PA-C  pregabalin (LYRICA) 200 MG capsule Take 200-400 mg by mouth 2 (two) times daily. Take  in the morning Take  in the evening    Historical Provider, MD  testosterone cypionate (DEPOTESTOSTERONE CYPIONATE) 200 MG/ML injection Inject 200 mg into the muscle every 14 (fourteen) days.    Historical Provider, MD    Family History No family history on  file.  Social History Social History  Substance Use Topics  . Smoking status: Former Games developer  . Smokeless tobacco: Never Used  . Alcohol use Yes     Allergies   Patient has no known allergies.   Review of Systems Review of Systems  Constitutional: Negative for appetite change, chills and fever.  HENT: Negative for ear pain, rhinorrhea, sneezing and sore throat.   Eyes: Negative for photophobia and visual disturbance.  Respiratory: Negative for cough, chest tightness, shortness of breath and wheezing.   Cardiovascular: Negative for chest pain and palpitations.  Gastrointestinal: Negative for abdominal pain, blood in stool, constipation, diarrhea, nausea and vomiting.  Genitourinary: Negative for dysuria, hematuria and urgency.  Musculoskeletal: Positive for joint swelling and myalgias.  Skin: Negative for rash.  Neurological: Positive for light-headedness. Negative for dizziness and weakness.     Physical Exam Updated Vital Signs BP (!) 113/54   Pulse 88   Temp 97.6 F (36.4 C) (Oral)   Resp 18   Ht  (1.905 m)   Wt (!) 156.5 kg   SpO2 98%   BMI 43.12 kg/m   Physical Exam  Constitutional: He appears well-developed and well-nourished. No distress.  HENT:  Head: Normocephalic and atraumatic.  Nose: Nose normal.  Eyes: Conjunctivae and EOM are normal. Right eye exhibits no discharge. Left eye exhibits no discharge. No scleral icterus.  Neck: Normal range of motion. Neck supple.  Cardiovascular: Normal rate, regular rhythm, normal heart sounds and intact distal pulses.  Exam reveals no gallop and no friction rub.   No murmur heard. Pulmonary/Chest: Effort normal and breath sounds normal. No respiratory distress.  Abdominal: Soft. Bowel sounds are normal. He exhibits no distension. There is no tenderness. There is no guarding.  Musculoskeletal: Normal range of motion. He exhibits edema (Mild edema of the left wrist.) and tenderness (Tenderness to palpation of the  entire left wrist area.). He exhibits no deformity.  Pain with active range of motion of wrist including flexion and extension. There is no temperature changes, color changes, signs of trauma, signs of injury. Patient has good pulses and normal sensation.  Neurological: He is alert. He exhibits normal muscle tone. Coordination normal.  Skin: Skin is warm and dry. No rash noted.  Psychiatric: He has a normal mood and affect.  Nursing note and vitals reviewed.    ED Treatments / Results  Labs (all labs ordered are listed, but only abnormal results are displayed) Labs Reviewed  Rosezena Sensor, ED    EKG  EKG Interpretation None       Radiology Dg Wrist Complete Left  Result Date: 05/04/2016 CLINICAL DATA:  Pain to the entire left wrist EXAM: LEFT WRIST - COMPLETE 3+ VIEW COMPARISON:  02/08/2007 FINDINGS: No fracture or malalignment. Old fracture deformity of the metacarpal. No periostitis. Soft tissues are unremarkable. IMPRESSION: 1. No acute osseous abnormality 2. Old fifth metacarpal fracture Electronically Signed  By: Jasmine Pang M.D.   On: 05/04/2016 18:47    Procedures Procedures (including critical care time)  Medications Ordered in ED Medications - No data to display   Initial Impression / Assessment and Plan / ED Course  I have reviewed the triage vital signs and the nursing notes.  Pertinent labs & imaging results that were available during my care of the patient were reviewed by me and considered in my medical decision making (see chart for details).     Patient's history and symptoms concerning for fracture versus dislocation versus exacerbation of carpal tunnel syndrome versus neuropathy. X-ray revealed no acute osseous abnormality. No evidence of fracture or dislocation.  The patient's symptoms could be due to carpal tunnel syndrome exacerbation or neuropathy which she has known to have in bilateral lower extremities. Patient is a insulin-dependent  diabetic so will not give any steroid medication. Encouraged him to wear his splint as much as he can especially at night and use anti-inflammatories for 1-2 days. Encouraged to follow up with PCP or hand surgeon if warranted. Return precautions given.   Final Clinical Impressions(s) / ED Diagnoses   Final diagnoses:  Left wrist pain    New Prescriptions New Prescriptions   No medications on file     Brooks Sailors 05/04/16 1929    Rolland Porter, MD 05/10/16 [phone]

## 2016-05-04 NOTE — Discharge Instructions (Signed)
Use wrist splint as much as possible, especially at night. Ibuprofen for 1-2 days as needed for pain and inflammation. Follow-up with PCP or hand surgery as warranted for further evaluation. Return to ED for worsening of symptoms, increased swelling, temperature changes, loss of range of motion, additional injury, trouble breathing.

## 2016-05-04 NOTE — ED Triage Notes (Signed)
Pt st's he has hx of carpal tunnel bil.  St's he woke up this am with pain and swelling to left wrist.  St's pain radiates up left arm.

## 2016-05-04 NOTE — Progress Notes (Signed)
Orthopedic Tech Progress Note Patient Details:  Barry [name] [birth year] [id number]  Ortho Devices Type of Ortho Device: Velcro wrist forearm splint Ortho Device/Splint Location: LUE Ortho Device/Splint Interventions: Ordered, Application   Jennye Moccasin 05/04/2016, 7:38 PM

## 2016-05-04 NOTE — ED Notes (Signed)
Pt was diaphoretic and pale at nurse first and triage. Pain radiated from wrist to shoulder.

## 2016-08-18 ENCOUNTER — Encounter (HOSPITAL_COMMUNITY): Payer: Self-pay

## 2016-08-18 ENCOUNTER — Emergency Department (HOSPITAL_COMMUNITY)
Admission: EM | Admit: 2016-08-18 | Discharge: 2016-08-18 | Disposition: A | Discharge status: Home / Self Care | Payer: Medicaid Other | Attending: Emergency Medicine | Admitting: Emergency Medicine

## 2016-08-18 ENCOUNTER — Emergency Department (HOSPITAL_COMMUNITY): Payer: Medicaid Other

## 2016-08-18 DIAGNOSIS — Z79899 Other long term (current) drug therapy: Secondary | ICD-10-CM | POA: Insufficient documentation

## 2016-08-18 DIAGNOSIS — M25531 Pain in right wrist: Secondary | ICD-10-CM | POA: Insufficient documentation

## 2016-08-18 DIAGNOSIS — E119 Type 2 diabetes mellitus without complications: Secondary | ICD-10-CM | POA: Diagnosis not present

## 2016-08-18 DIAGNOSIS — E039 Hypothyroidism, unspecified: Secondary | ICD-10-CM | POA: Insufficient documentation

## 2016-08-18 DIAGNOSIS — Z794 Long term (current) use of insulin: Secondary | ICD-10-CM | POA: Diagnosis not present

## 2016-08-18 DIAGNOSIS — Z87891 Personal history of nicotine dependence: Secondary | ICD-10-CM | POA: Diagnosis not present

## 2016-08-18 IMAGING — CR DG WRIST COMPLETE 3+V*R*
4 series · 4 of 4 positions shown · non-contrast
Comparison: [DATE].

CLINICAL DATA: Medial wall, palmar right wrist pain. No known
injury.

EXAM:
RIGHT WRIST - COMPLETE 3+ VIEW

[x wrist pa right]
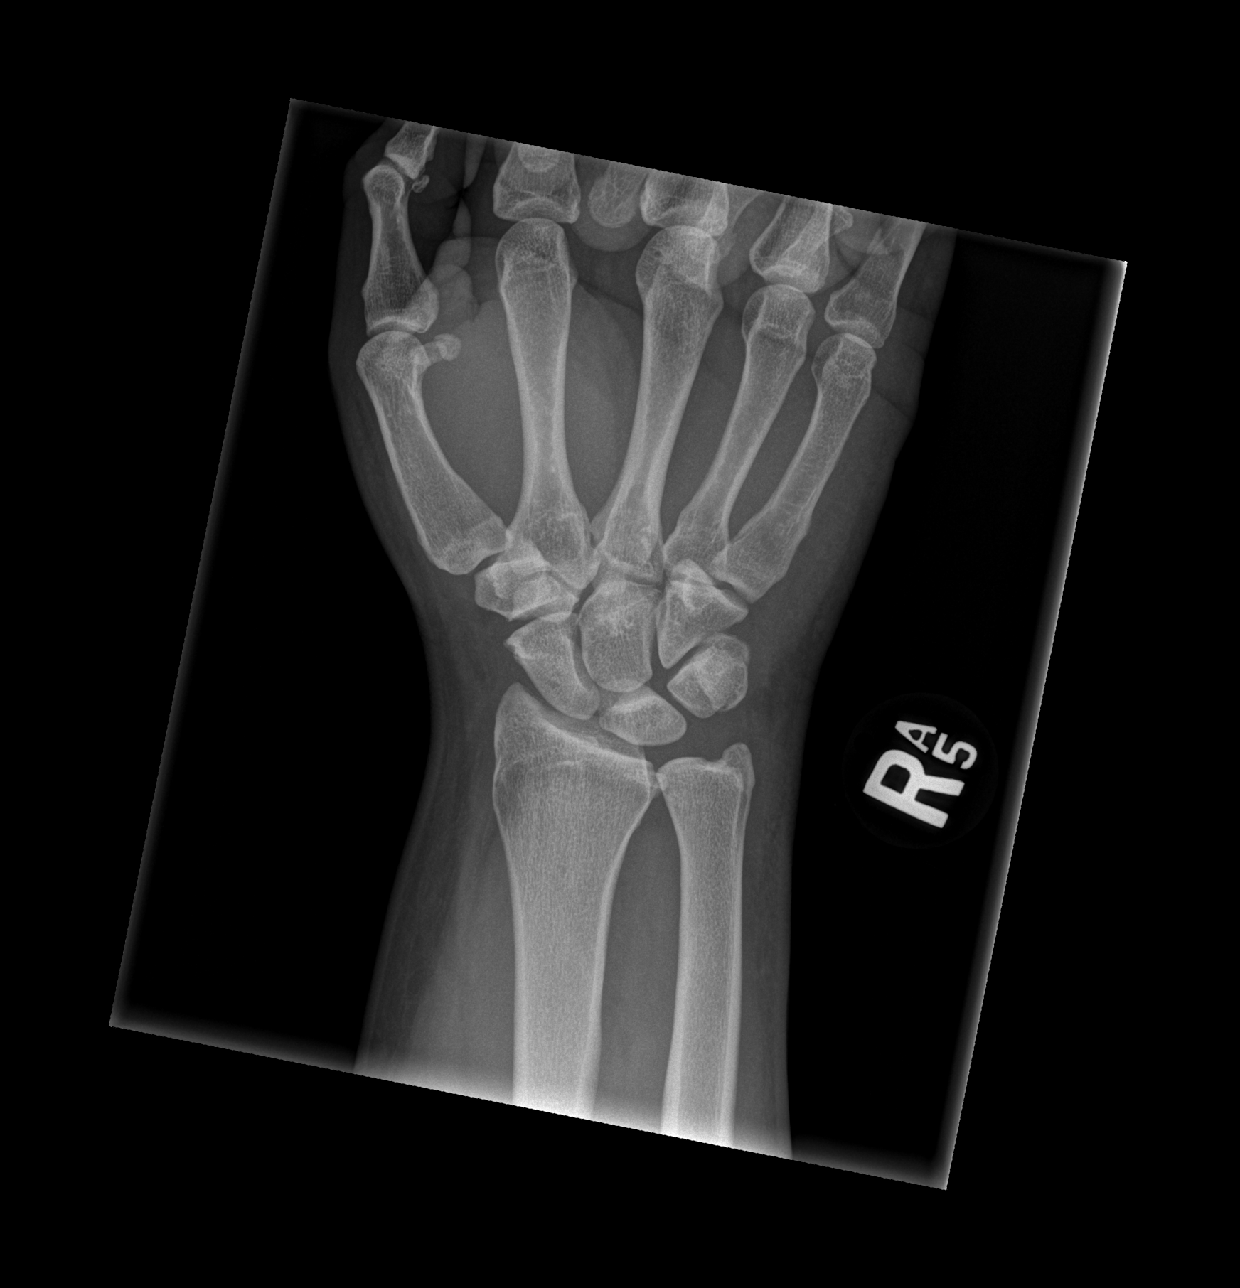

[x wrist obl right]
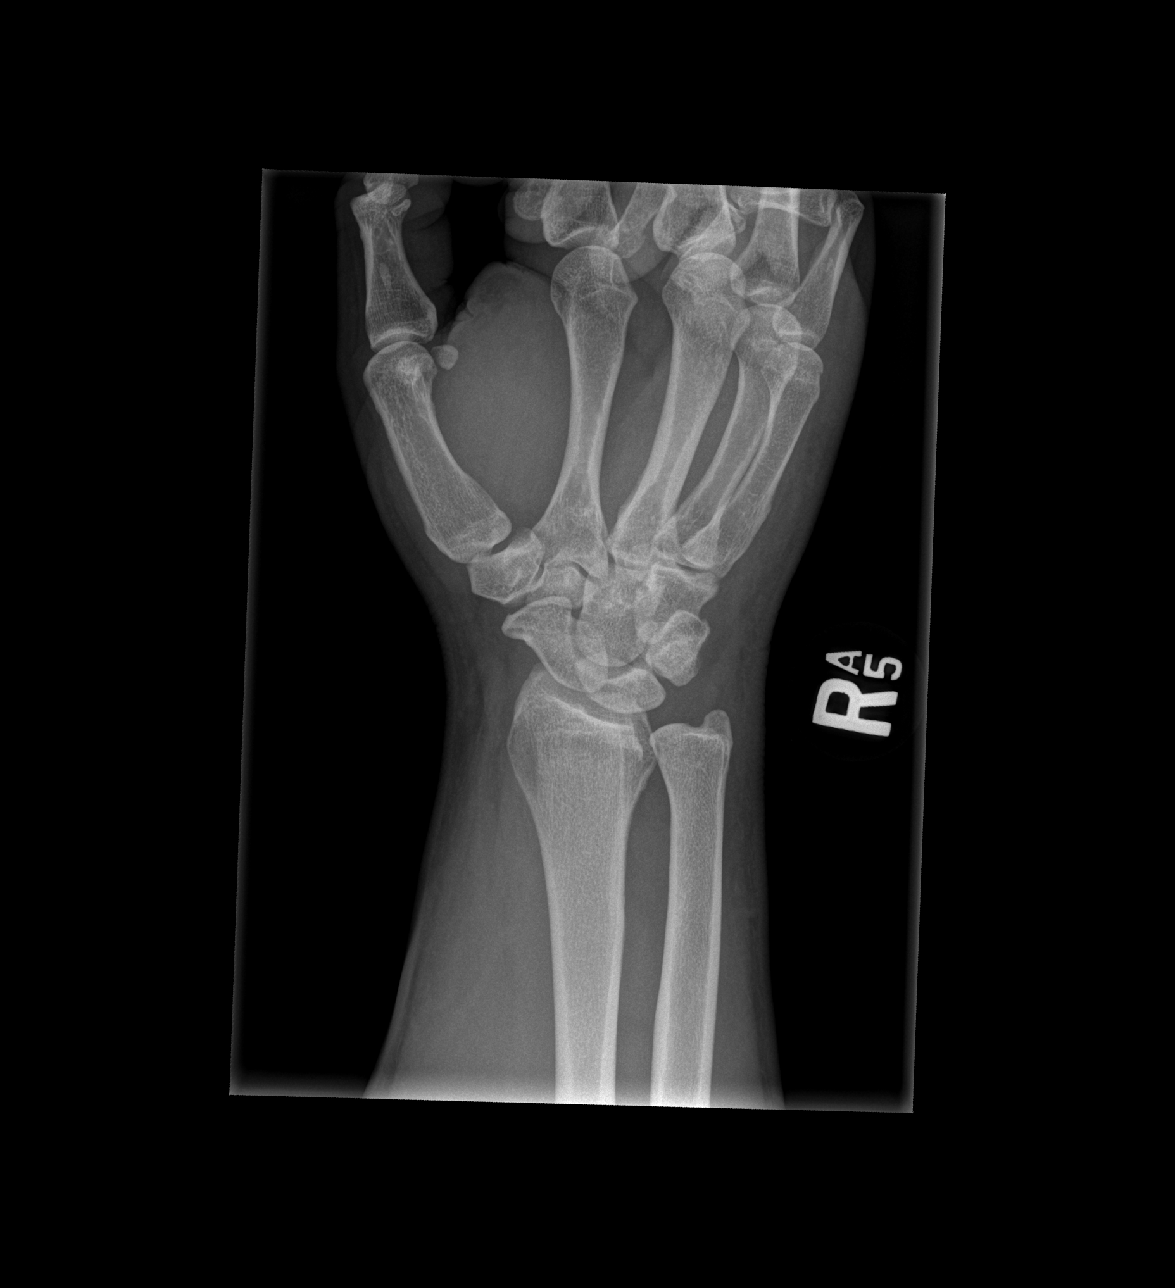

[x wrist lat right]
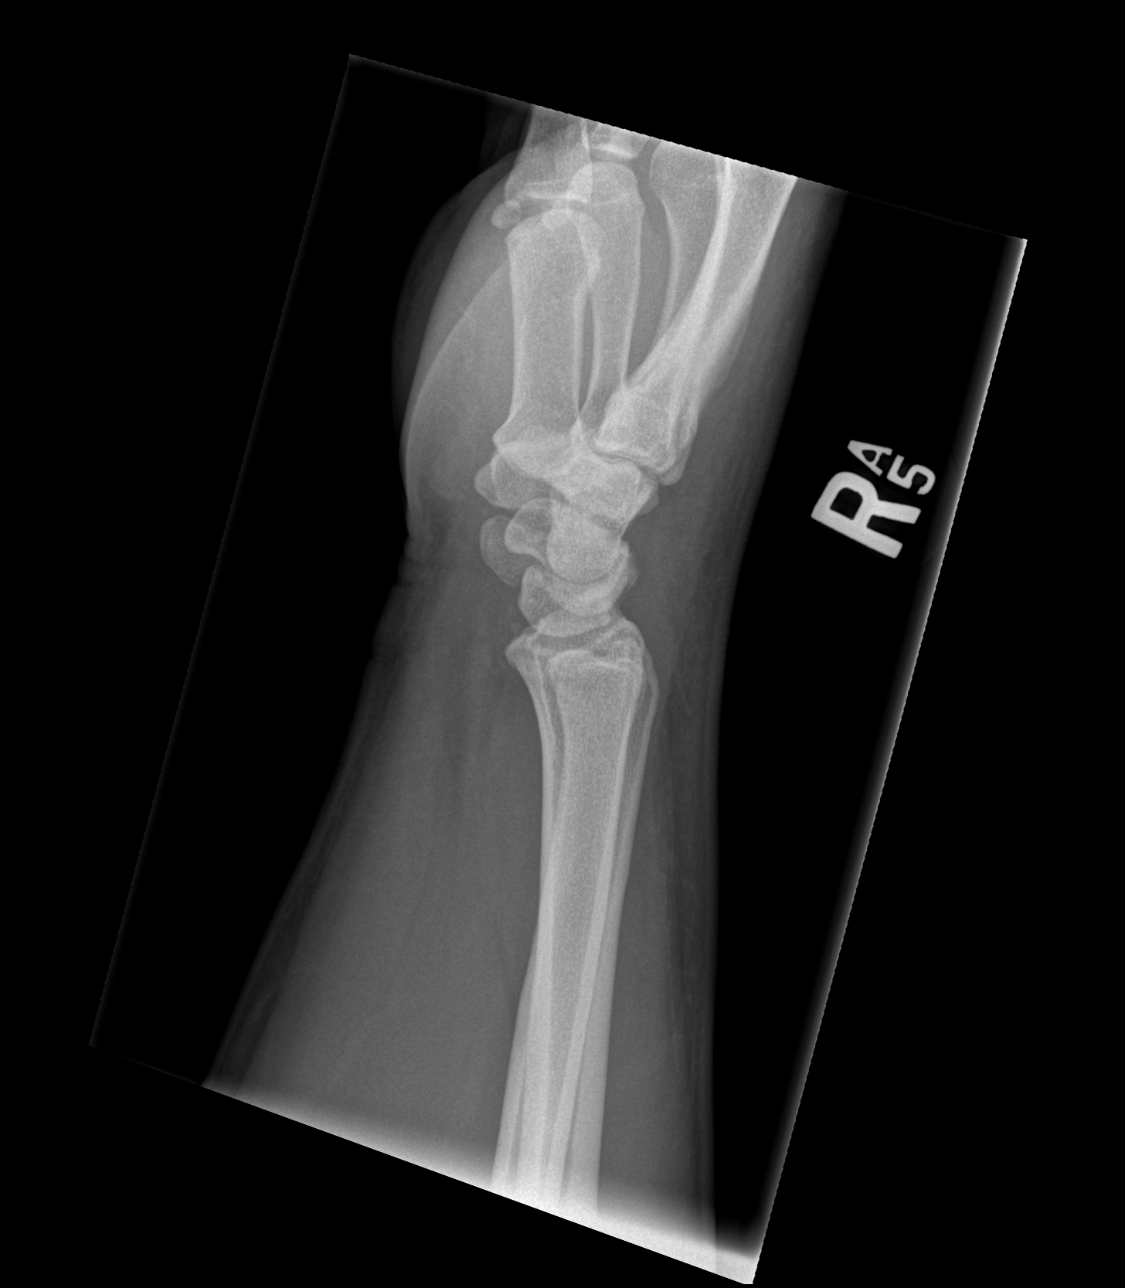

[x wrist navicular view right]
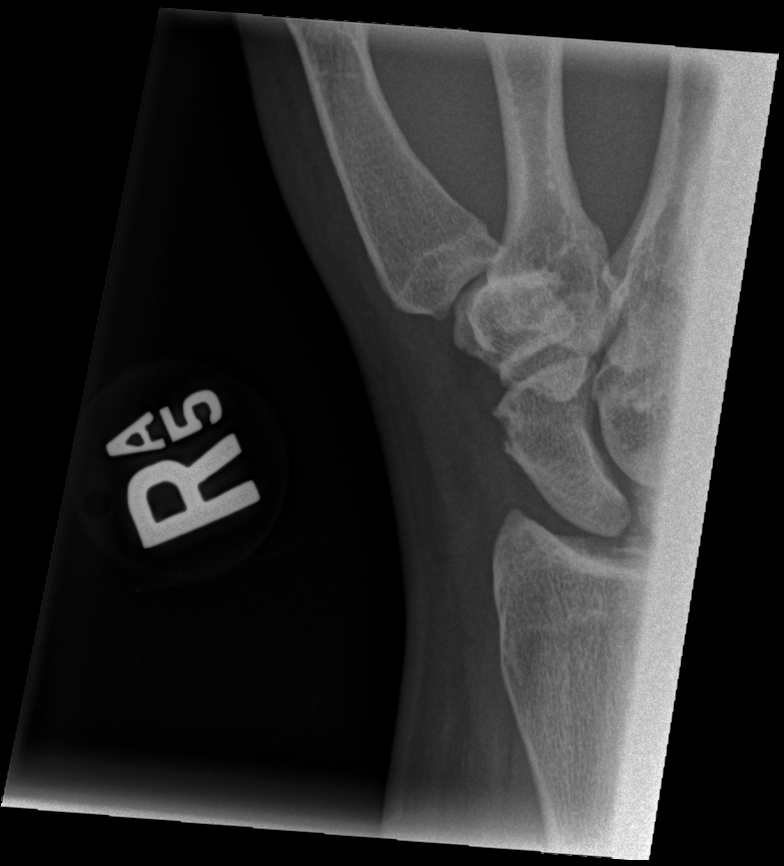

[4 of 4 positions shown; findings below may reference images not displayed]

FINDINGS: There is no evidence of fracture or dislocation. There is no
evidence of arthropathy or other focal bone abnormality. Soft
tissues are unremarkable.
IMPRESSION: No acute abnormality.

## 2016-08-18 MED ORDER — PREDNISONE 20 MG PO TABS
60.0000 mg | ORAL_TABLET | Freq: Once | ORAL | Status: AC
  Administered 2016-08-18: 60 mg via ORAL
  Filled 2016-08-18: qty 3

## 2016-08-18 MED ORDER — PREDNISONE 20 MG PO TABS: ORAL_TABLET | ORAL | 0 refills | Status: DC

## 2016-08-18 MED ORDER — CYCLOBENZAPRINE HCL 10 MG PO TABS: 10.0000 mg | ORAL_TABLET | Freq: Two times a day (BID) | ORAL | 0 refills | Status: DC | PRN

## 2016-08-18 MED ORDER — IBUPROFEN 800 MG PO TABS
800.0000 mg | ORAL_TABLET | Freq: Once | ORAL | Status: AC
  Administered 2016-08-18: 800 mg via ORAL
  Filled 2016-08-18: qty 1

## 2016-08-18 NOTE — ED Triage Notes (Signed)
Pt presents with R wrist pain and swelling that started Thursday. Hx of same in L wrist. No injury. Pt reports taking ibuprofen and aleve with minimal relief. A&Ox4. Ambulatory.

## 2016-08-18 NOTE — ED Provider Notes (Signed)
[facility]-EMERGENCY DEPT Provider Note   CSN: [id number] Arrival date & time: 08/18/16  1932     History   Chief Complaint Chief Complaint  Patient presents with  . Wrist Pain    R    HPI Barry [name] is a 41 y.o. male.  HPI   41 year old male with history of diabetes, carpal tunnel syndrome, obesity presenting complaining of right wrist pain.Patient report gradual onset of throbbing sharp pain involving R wrist ongoing for 5 days.  Pain is persistent, worsen with movement, having difficulty making a fist.  Pain is not associate with numbness or weakness.  No fever, or rash.  No specific injury.  Pt is L handed, had carpal tunnel syndrome involving the L hand which felt similar.  He has tried ice, ace wrap, wrist brace and ibuprofen without relief.  Does not have hand specialist.  Denies recent injury.  Doubt gout.   Past Medical History:  Diagnosis Date  . Carpal tunnel syndrome   . Diabetes mellitus without complication (HCC)   . Hypercholesteremia   . Obesity     Patient Active Problem List   Diagnosis Date Noted  . HYPOTHYROIDISM 05/31/2008  . DYSLIPIDEMIA 05/31/2008  . OBESITY, MORBID 05/31/2008  . MYOCARDIAL PERFUSION SCAN, WITH STRESS TEST, ABNORMAL 05/31/2008    Past Surgical History:  Procedure Laterality Date  . FRACTURE SURGERY    . INCISE AND DRAIN ABCESS    . TONSILLECTOMY         Home Medications    Prior to Admission medications   Medication Sig Start Date End Date Taking? Authorizing Provider  atorvastatin (LIPITOR) 10 MG tablet Take 10 mg by mouth daily.    [provider]  canagliflozin (INVOKANA) 300 MG TABS tablet Take 300 mg by mouth daily before breakfast.    [provider]  cyclobenzaprine (FLEXERIL) 10 MG tablet Take 1 tablet (10 mg total) by mouth 2 (two) times daily as needed for muscle spasms. Patient not taking: Reported on 06/09/2015 05/03/15   Danelle Berryapia, Leisa, PA-C  exenatide (BYETTA) 10 MCG/0.04ML SOPN injection  Inject 10 mcg into the skin 2 (two) times daily with a meal.    [provider]  insulin NPH-regular Human (NOVOLIN 70/30) (70-30) 100 UNIT/ML injection Inject 60 Units into the skin 2 (two) times daily with a meal.    [provider]  levothyroxine (SYNTHROID, LEVOTHROID) 75 MCG tablet Take 75 mcg by mouth daily before breakfast.    [provider]  lisinopril (PRINIVIL,ZESTRIL) 10 MG tablet Take 10 mg by mouth daily.    [provider]  metFORMIN (GLUCOPHAGE) 1000 MG tablet Take 1,000 mg by mouth 2 (two) times daily with a meal.    [provider]  morphine (MS CONTIN) 15 MG 12 hr tablet Take 15 mg by mouth every 12 (twelve) hours.    [provider]  oxyCODONE-acetaminophen (PERCOCET) 5-325 MG tablet Take 1 tablet by mouth every 4 (four) hours as needed. Patient not taking: Reported on 06/09/2015 05/03/15   Danelle Berryapia, Leisa, PA-C  predniSONE (DELTASONE) 20 MG tablet Take 2 tablets (40 mg total) by mouth daily. Take 40 mg by mouth daily for 3 days, then 20mg  by mouth daily for 3 days, then 10mg  daily for 3 days Patient not taking: Reported on 06/09/2015 05/03/15   Danelle Berryapia, Leisa, PA-C  pregabalin (LYRICA) 200 MG capsule Take 200-400 mg by mouth 2 (two) times daily. Take 200mg  in the morning Take 400mg  in the evening    [provider]  testosterone cypionate (DEPOTESTOSTERONE CYPIONATE) 200 MG/ML injection Inject 200 mg into the muscle every 14 (fourteen) days.    [provider]    Family History History reviewed. No pertinent family history.  Social History Social History  Substance Use Topics  . Smoking status: Former Games developermoker  . Smokeless tobacco: Never Used  . Alcohol use Yes     Allergies   Patient has no known allergies.   Review of Systems Review of Systems  Constitutional: Negative for fever.  Musculoskeletal: Positive for arthralgias.  Skin: Negative for rash and wound.  Neurological: Negative for numbness.      Physical Exam Updated Vital Signs BP (!) 152/89 (BP Location: Left Arm)   Pulse 96   Temp 98.3 F (36.8 C) (Oral)   Resp 16   SpO2 95%   Physical Exam  Constitutional: He appears well-developed and well-nourished. No distress.  HENT:  Head: Atraumatic.  Eyes: Conjunctivae are normal.  Neck: Neck supple.  Musculoskeletal: He exhibits tenderness (R wrist: diffuse tenderness about the wrist with decrease flexion/extension/supination/pronation due to pain.  no erythema or warmth, no deformity.  radial pulse 2+).  R hand nontender to palpation, brisk cap refill. R forearm is soft and sensation intact.  R elbow nontender.  Neurological: He is alert.  Skin: No rash noted.  Psychiatric: He has a normal mood and affect.  Nursing note and vitals reviewed.    ED Treatments / Results  Labs (all labs ordered are listed, but only abnormal results are displayed) Labs Reviewed - No data to display  EKG  EKG Interpretation None       Radiology No results found.  Procedures Procedures (including critical care time)  Medications Ordered in ED Medications - No data to display   Initial Impression / Assessment and Plan / ED Course  I have reviewed the triage vital signs and the nursing notes.  Pertinent labs & imaging results that were available during my care of the patient were reviewed by me and considered in my medical decision making (see chart for details).     BP (!) 152/89 (BP Location: Left Arm)   Pulse 96   Temp 98.3 F (36.8 C) (Oral)   Resp 16   SpO2 95%    Final Clinical Impressions(s) / ED Diagnoses   Final diagnoses:  Right wrist pain    New Prescriptions New Prescriptions   PREDNISONE (DELTASONE) 20 MG TABLET    3 tabs po day one, then 2 tabs daily x 4 days   8:36 PM Pt with hx of Carpal Tunnel Syndrome here with atraumatic R wrist pain similar to prior CTS.  No evidence to suggest septic joint or cellulitis.  Xray R wrist negative.   Encourage pt to continue with ace wrap, ice, rest, and f/u with hand specialist for further care.  Pt is NVI.  He request steroid as it has helped in the past.  Pt made aware steroid can affect his diabetes.    Fayrene Helperran, Jeronimo Hellberg, PA-C 08/18/16 2039    Mancel BaleWentz, Elliott, MD 08/18/16 [phone]

## 2016-08-25 ENCOUNTER — Encounter (HOSPITAL_COMMUNITY): Payer: Self-pay | Admitting: Emergency Medicine

## 2016-08-25 ENCOUNTER — Emergency Department (HOSPITAL_COMMUNITY)
Admission: EM | Admit: 2016-08-25 | Discharge: 2016-08-25 | Disposition: A | Discharge status: Home / Self Care | Payer: Medicaid Other | Attending: Emergency Medicine | Admitting: Emergency Medicine

## 2016-08-25 DIAGNOSIS — E039 Hypothyroidism, unspecified: Secondary | ICD-10-CM | POA: Diagnosis not present

## 2016-08-25 DIAGNOSIS — M25531 Pain in right wrist: Secondary | ICD-10-CM | POA: Insufficient documentation

## 2016-08-25 DIAGNOSIS — E785 Hyperlipidemia, unspecified: Secondary | ICD-10-CM | POA: Diagnosis not present

## 2016-08-25 DIAGNOSIS — E119 Type 2 diabetes mellitus without complications: Secondary | ICD-10-CM | POA: Insufficient documentation

## 2016-08-25 DIAGNOSIS — Z87891 Personal history of nicotine dependence: Secondary | ICD-10-CM | POA: Diagnosis not present

## 2016-08-25 DIAGNOSIS — E7800 Pure hypercholesterolemia, unspecified: Secondary | ICD-10-CM | POA: Insufficient documentation

## 2016-08-25 DIAGNOSIS — Z79899 Other long term (current) drug therapy: Secondary | ICD-10-CM | POA: Insufficient documentation

## 2016-08-25 HISTORY — DX: Polyneuropathy, unspecified: G62.9

## 2016-08-25 MED ORDER — COLCHICINE 0.6 MG PO TABS
1.2000 mg | ORAL_TABLET | Freq: Once | ORAL | Status: AC
  Administered 2016-08-25: 1.2 mg via ORAL
  Filled 2016-08-25: qty 2

## 2016-08-25 MED ORDER — OXYCODONE-ACETAMINOPHEN 5-325 MG PO TABS
2.0000 | ORAL_TABLET | Freq: Once | ORAL | Status: AC
  Administered 2016-08-25: 2 via ORAL
  Filled 2016-08-25: qty 2

## 2016-08-25 MED ORDER — COLCHICINE 0.6 MG PO TABS: 0.6000 mg | ORAL_TABLET | Freq: Every day | ORAL | 0 refills | Status: DC

## 2016-08-25 MED ORDER — HYDROCODONE-ACETAMINOPHEN 5-325 MG PO TABS
1.0000 | ORAL_TABLET | ORAL | 0 refills | Status: DC | PRN
Start: 2016-08-25 — End: 2016-11-15

## 2016-08-25 NOTE — ED Provider Notes (Signed)
[facility]-EMERGENCY DEPT Provider Note   CSN: [id number] Arrival date & time: 08/25/16  2123  By signing my name below, I, Diona Browner, attest that this documentation has been prepared under the direction and in the presence of Claribel Sachs, PA-C. Electronically Signed: Diona Browner, ED Scribe. 08/25/16. 10:58 PM.  History   Chief Complaint Chief Complaint  Patient presents with  . Wrist Pain    HPI Barry [name] is a 41 y.o. male with a PMHx of carpal tunnel, DM, and gout, who presents to the Emergency Department complaining of Right wrist aching and swelling over the last week. Patient has had similar symptoms in the past. He notes that this feels similar to his previous gout flares. He has previously had gout in this joint as well as many other joints. Patient also has what he describes as stage IV carpal tunnel syndrome bilaterally, diagnosed by his neurologist. Patient has tried ice, ace wrap, wrist brace, aleve, and ibuprofen without relief.  Pt was seen at [facility] ED for same on 08/18/16, was prescribed steroids, and was told to follow up with a hand doctor, which he has not done. He took his last dose of steroids yesterday. The steroids improved his symptoms, but then the symptoms returned. He notes an increase in his blood sugar levels during steroid use, but states they were manageable. Pt is L handed. No hx of kidney problems. Denies recent injury, fever/chills, spreading pain/redness, neuro deficits, or any other complaints.  The history is provided by the patient. No language interpreter was used.    Past Medical History:  Diagnosis Date  . Carpal tunnel syndrome   . Diabetes mellitus without complication (HCC)   . Hypercholesteremia   . Neuropathy   . Obesity     Patient Active Problem List   Diagnosis Date Noted  . HYPOTHYROIDISM 05/31/2008  . DYSLIPIDEMIA 05/31/2008  . OBESITY, MORBID 05/31/2008  . MYOCARDIAL PERFUSION SCAN, WITH STRESS TEST, ABNORMAL 05/31/2008    Past  Surgical History:  Procedure Laterality Date  . FRACTURE SURGERY    . INCISE AND DRAIN ABCESS    . TONSILLECTOMY         Home Medications    Prior to Admission medications   Medication Sig Start Date End Date Taking? Authorizing Provider  atorvastatin (LIPITOR) 10 MG tablet Take 10 mg by mouth daily.    [provider]  canagliflozin (INVOKANA) 300 MG TABS tablet Take 300 mg by mouth daily before breakfast.    [provider]  colchicine 0.6 MG tablet Take 1 tablet (0.6 mg total) by mouth daily. 08/25/16   Afrika Brick C, PA-C  cyclobenzaprine (FLEXERIL) 10 MG tablet Take 1 tablet (10 mg total) by mouth 2 (two) times daily as needed for muscle spasms. 08/18/16   Fayrene Helper, PA-C  exenatide (BYETTA) 10 MCG/0.04ML SOPN injection Inject 10 mcg into the skin 2 (two) times daily with a meal.    [provider]  HYDROcodone-acetaminophen (NORCO/VICODIN) 5-325 MG tablet Take 1-2 tablets by mouth every 4 (four) hours as needed for severe pain. 08/25/16   Alycea Segoviano C, PA-C  insulin NPH-regular Human (NOVOLIN 70/30) (70-30) 100 UNIT/ML injection Inject 60 Units into the skin 2 (two) times daily with a meal.    [provider]  levothyroxine (SYNTHROID, LEVOTHROID) 75 MCG tablet Take 75 mcg by mouth daily before breakfast.    [provider]  lisinopril (PRINIVIL,ZESTRIL) 10 MG tablet Take 10 mg by mouth daily.    [provider]  metFORMIN (GLUCOPHAGE) 1000 MG tablet Take 1,000 mg by mouth 2 (two) times daily with a meal.    [provider]  morphine (MS CONTIN) 15 MG 12 hr tablet Take 15 mg by mouth every 12 (twelve) hours.    [provider]  oxyCODONE-acetaminophen (PERCOCET) 5-325 MG tablet Take 1 tablet by mouth every 4 (four) hours as needed. Patient not taking: Reported on 06/09/2015 05/03/15   Danelle Berryapia, Leisa, PA-C  predniSONE (DELTASONE) 20 MG tablet 3 tabs po day one, then 2 tabs daily x 4 days 08/18/16   Fayrene Helperran, Bowie, PA-C    pregabalin (LYRICA) 200 MG capsule Take 200-400 mg by mouth 2 (two) times daily. Take 200mg  in the morning Take 400mg  in the evening    [provider]  testosterone cypionate (DEPOTESTOSTERONE CYPIONATE) 200 MG/ML injection Inject 200 mg into the muscle every 14 (fourteen) days.    [provider]    Family History No family history on file.  Social History Social History  Substance Use Topics  . Smoking status: Former Games developermoker  . Smokeless tobacco: Never Used  . Alcohol use Yes     Allergies   Patient has no known allergies.   Review of Systems Review of Systems  Constitutional: Negative for fever.  Musculoskeletal: Positive for arthralgias and joint swelling.  Skin: Negative for pallor.  Neurological: Negative for weakness and numbness.     Physical Exam Updated Vital Signs BP (!) 154/97 (BP Location: Left Arm)   Pulse 99   Temp 98.2 F (36.8 C) (Oral)   Resp 20   Ht 6\' 3"  (1.905 m)   Wt (!) 360 lb (163.3 kg)   SpO2 96%   BMI 45.00 kg/m   Physical Exam  Constitutional: He appears well-developed and well-nourished. No distress.  HENT:  Head: Normocephalic and atraumatic.  Eyes: Conjunctivae are normal.  Neck: Neck supple.  Cardiovascular: Normal rate and regular rhythm.   Pulmonary/Chest: Effort normal.  Musculoskeletal: He exhibits edema and tenderness.  Tenderness over the radial right wrist with some associated swelling. No noted erythema or significant increased warmth. Flexion and extension of each of the digits of the right hand is intact. He can touch the tip of his thumb to each of the other fingers.  Active range of motion in the right wrist is limited due to pain. Patient allows passive range of motion, although painful.  Neurological: He is alert. No sensory deficit.  No noted sensory deficits in the bilateral hands. Grip strength 5/5 bilaterally.  Skin: Skin is warm and dry. He is not diaphoretic.  Psychiatric: He has a normal  mood and affect. His behavior is normal.  Nursing note and vitals reviewed.    ED Treatments / Results  Labs (all labs ordered are listed, but only abnormal results are displayed) Labs Reviewed - No data to display  EKG  EKG Interpretation None       Radiology  Dg Wrist Complete Right  Result Date: 08/18/2016 CLINICAL DATA:  Medial wall, palmar right wrist pain. No known injury. EXAM: RIGHT WRIST - COMPLETE 3+ VIEW COMPARISON:  02/08/2015. FINDINGS: There is no evidence of fracture or dislocation. There is no evidence of arthropathy or other focal bone abnormality. Soft tissues are unremarkable. IMPRESSION: No acute abnormality. Electronically Signed   By: Beckie SaltsSteven  Reid M.D.   On: 08/18/2016 20:21    Procedures Procedures (including critical care time)  Medications Ordered in ED Medications  oxyCODONE-acetaminophen (PERCOCET/ROXICET) 5-325 MG per tablet 2  tablet (2 tablets Oral Given 08/25/16 2343)  colchicine tablet 1.2 mg (1.2 mg Oral Given 08/25/16 2343)     Initial Impression / Assessment and Plan / ED Course  I have reviewed the triage vital signs and the nursing notes.  Pertinent labs & imaging results that were available during my care of the patient were reviewed by me and considered in my medical decision making (see chart for details).     Patient presents with right wrist pain. Doubt septic joint. May be gout flare. Hand surgery follow up. The patient was given instructions for home care as well as return precautions. Patient voices understanding of these instructions, accepts the plan, and is comfortable with discharge.     Final Clinical Impressions(s) / ED Diagnoses   Final diagnoses:  Right wrist pain    New Prescriptions Discharge Medication List as of 08/25/2016 11:05 PM    START taking these medications   Details  colchicine 0.6 MG tablet Take 1 tablet (0.6 mg total) by mouth daily., Starting Mon 08/25/2016, Print    HYDROcodone-acetaminophen  (NORCO/VICODIN) 5-325 MG tablet Take 1-2 tablets by mouth every 4 (four) hours as needed for severe pain., Starting Mon 08/25/2016, Print       I personally performed the services described in this documentation, which was scribed in my presence. The recorded information has been reviewed and is accurate.    Anselm Pancoast, PA-C 08/28/16 1505    Tilden Fossa, MD 08/29/16 [phone]

## 2016-08-25 NOTE — ED Notes (Signed)
Pt verbalized understanding discharge instructions and denies any further needs or questions at this time. VS stable, ambulatory and steady gait.   

## 2016-08-25 NOTE — ED Triage Notes (Signed)
Pt reports R wrist pain, seen here last week for same. Has hx of carpal tunnel, was given steroids and told to follow up with hand which he has not done.

## 2016-08-25 NOTE — Discharge Instructions (Signed)
Begin taking the colchicine tomorrow on a daily basis. Use the Vicodin as needed for severe pain. Do not drive or perform other dangerous activities while taking the Vicodin. Stop taking the colchicine should you develop muscle aches. Follow-up with the hand specialist as soon as possible in this matter. Return to the ED as needed for worsening symptoms.

## 2016-09-04 IMAGING — CR DG WRIST COMPLETE 3+V*L*
4 series · 4 of 4 positions shown · non-contrast
Comparison: [DATE]

CLINICAL DATA: Pain to the entire left wrist

EXAM:
LEFT WRIST - COMPLETE 3+ VIEW

[wrist pa]
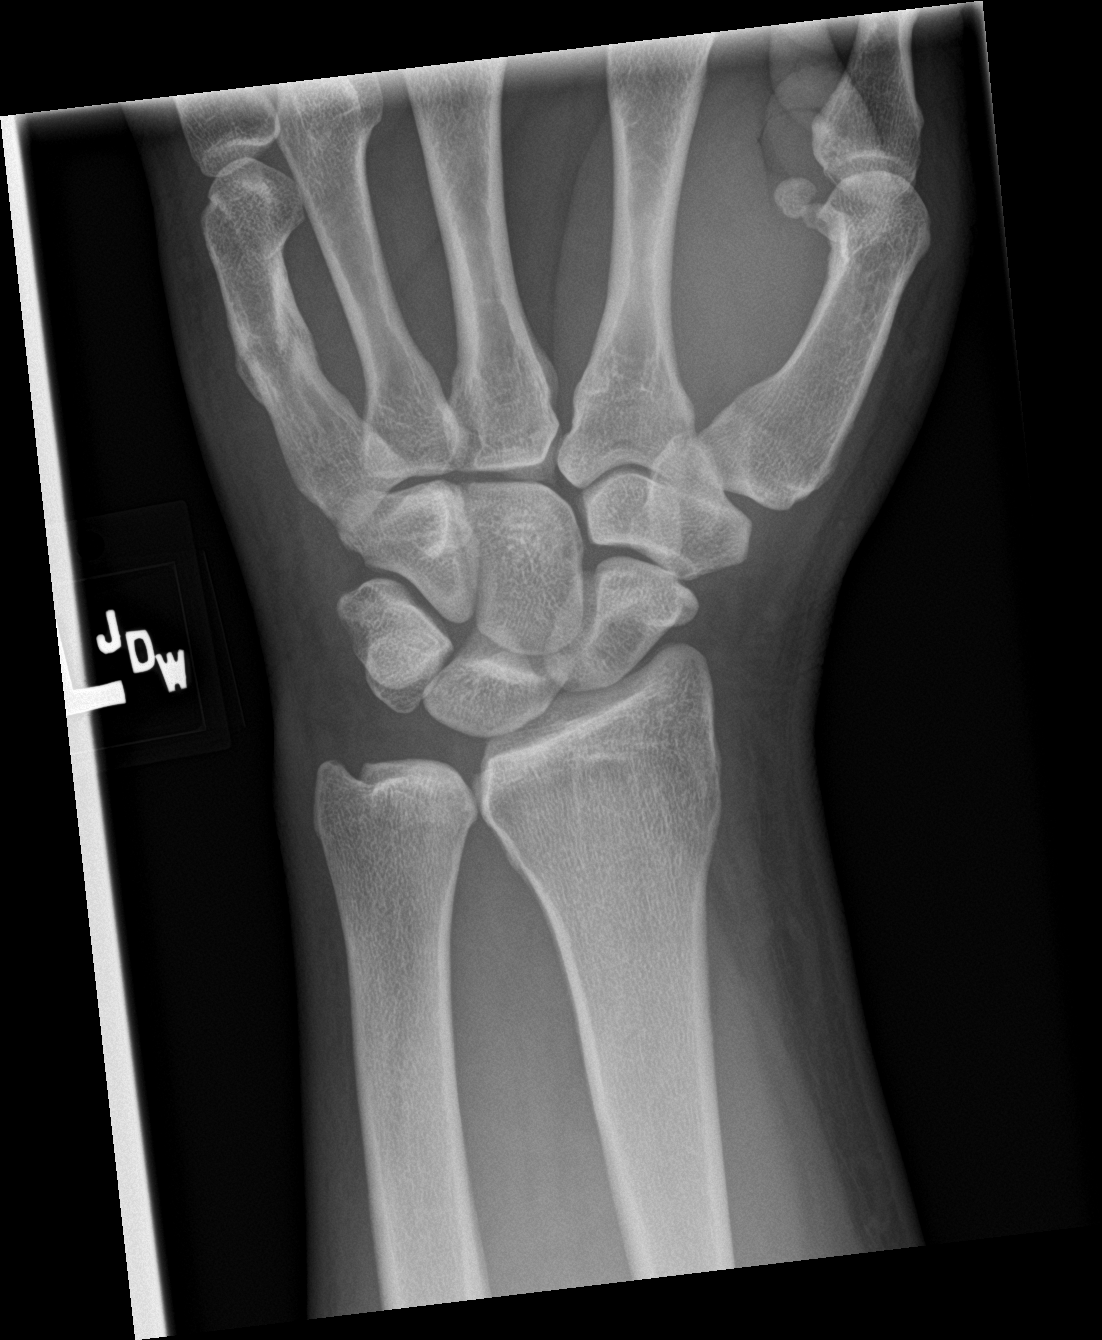

[wrist obl]
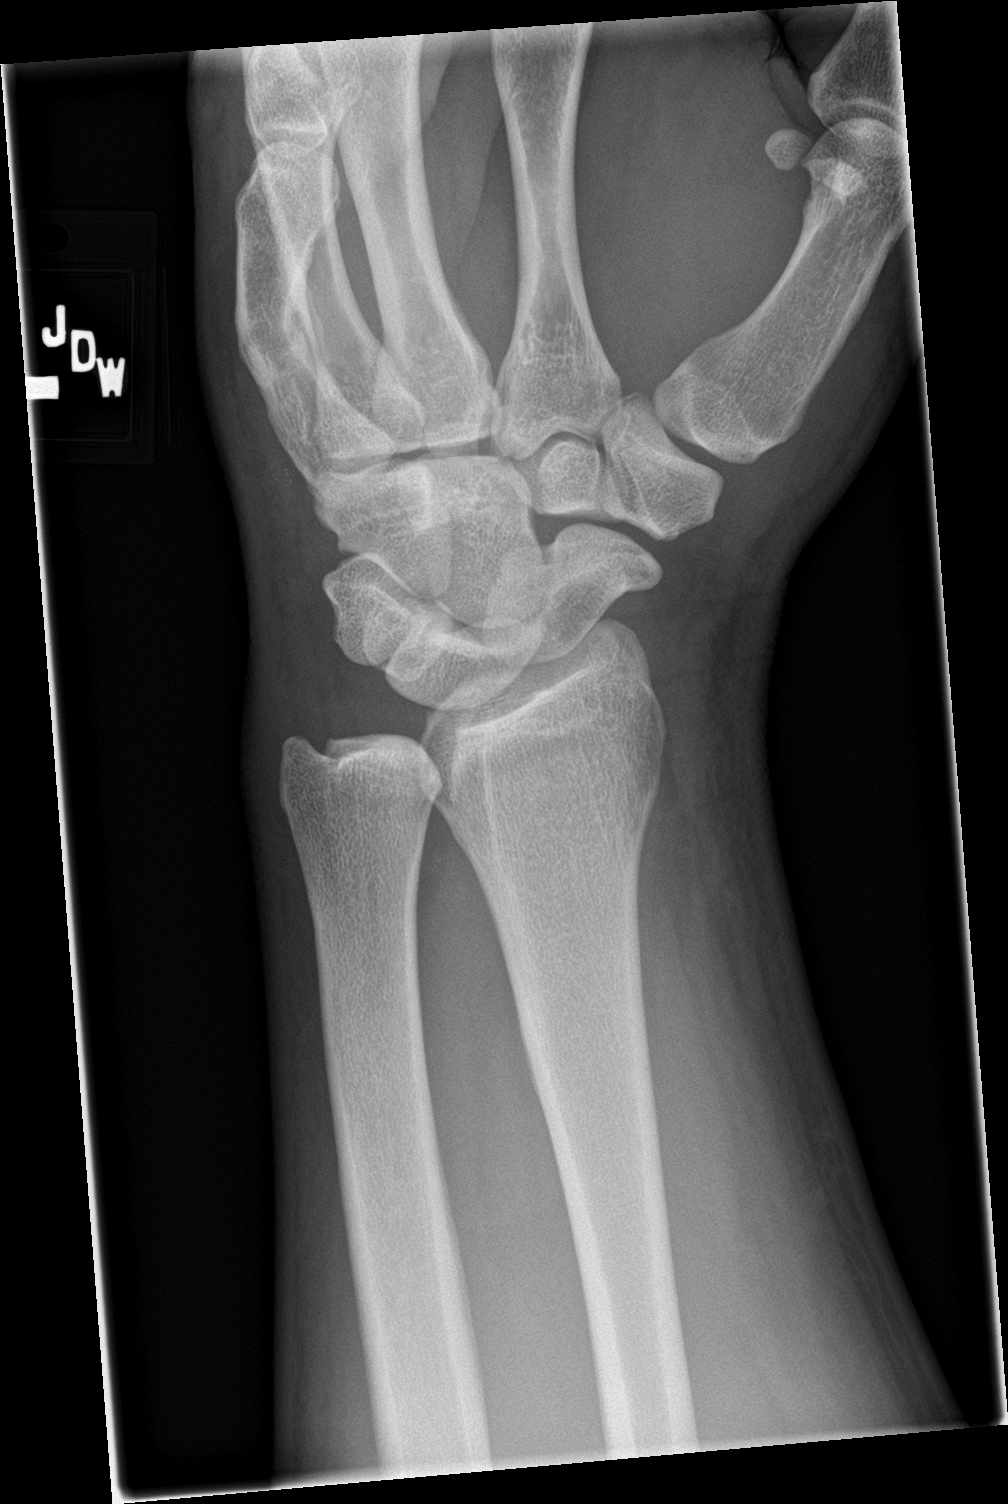

[wrist lat]
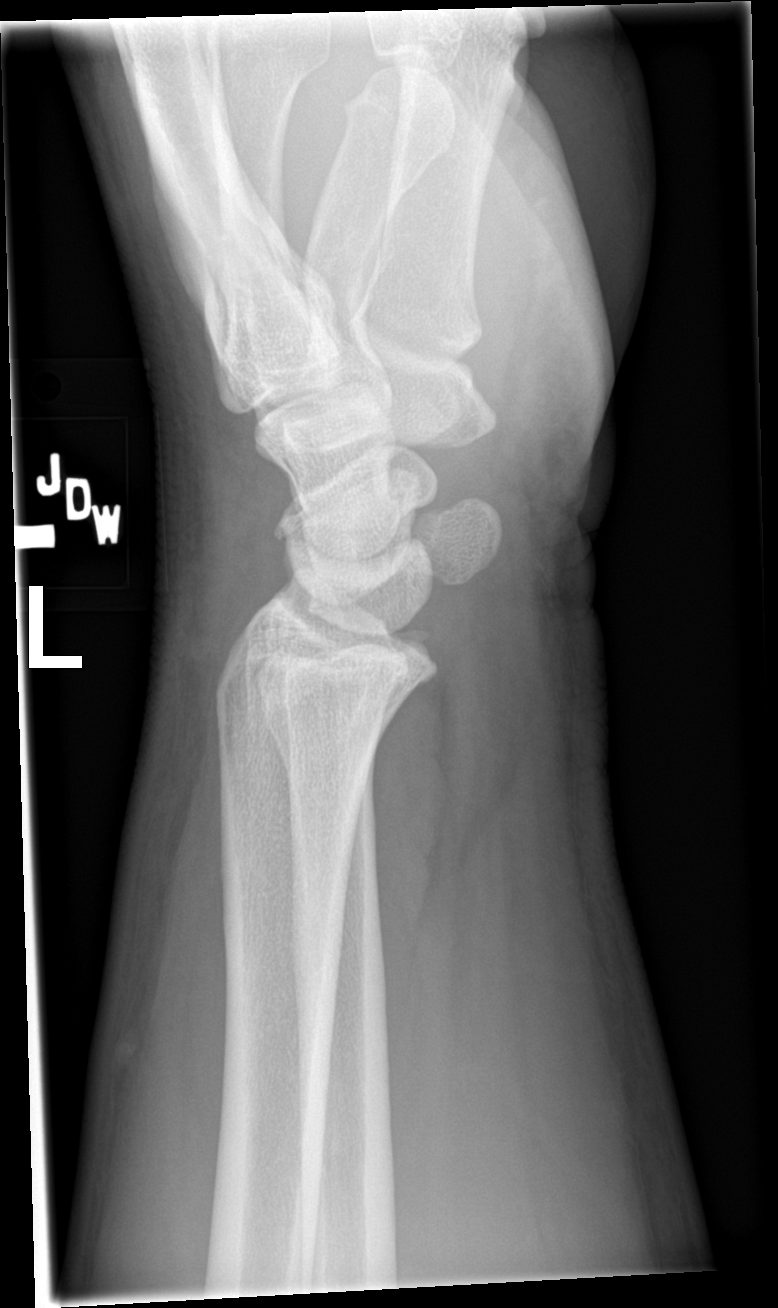

[wrist navicular]
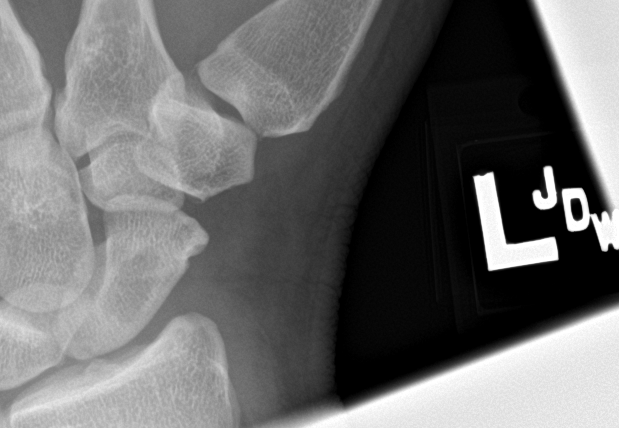

[4 of 4 positions shown; findings below may reference images not displayed]

FINDINGS: No fracture or malalignment. Old fracture deformity of the
metacarpal. No periostitis. Soft tissues are unremarkable.
IMPRESSION: 1. No acute osseous abnormality
2. Old fifth metacarpal fracture

## 2016-11-15 ENCOUNTER — Encounter (HOSPITAL_COMMUNITY): Payer: Self-pay | Admitting: Emergency Medicine

## 2016-11-15 ENCOUNTER — Emergency Department (HOSPITAL_COMMUNITY)
Admission: EM | Admit: 2016-11-15 | Discharge: 2016-11-15 | Disposition: A | Discharge status: Home / Self Care | Payer: Medicaid Other | Attending: Emergency Medicine | Admitting: Emergency Medicine

## 2016-11-15 ENCOUNTER — Emergency Department (HOSPITAL_COMMUNITY): Payer: Medicaid Other

## 2016-11-15 DIAGNOSIS — R739 Hyperglycemia, unspecified: Secondary | ICD-10-CM | POA: Diagnosis not present

## 2016-11-15 DIAGNOSIS — E119 Type 2 diabetes mellitus without complications: Secondary | ICD-10-CM | POA: Diagnosis not present

## 2016-11-15 DIAGNOSIS — Z794 Long term (current) use of insulin: Secondary | ICD-10-CM | POA: Insufficient documentation

## 2016-11-15 DIAGNOSIS — E039 Hypothyroidism, unspecified: Secondary | ICD-10-CM | POA: Insufficient documentation

## 2016-11-15 DIAGNOSIS — Z87891 Personal history of nicotine dependence: Secondary | ICD-10-CM | POA: Insufficient documentation

## 2016-11-15 DIAGNOSIS — M25531 Pain in right wrist: Secondary | ICD-10-CM

## 2016-11-15 DIAGNOSIS — Z79899 Other long term (current) drug therapy: Secondary | ICD-10-CM | POA: Diagnosis not present

## 2016-11-15 LAB — CBC WITH DIFFERENTIAL/PLATELET
BASOS ABS: 0 10*3/uL (ref 0.0–0.1)
BASOS PCT: 0 %
Eosinophils Absolute: 0.2 10*3/uL (ref 0.0–0.7)
Eosinophils Relative: 3 %
HCT: 42 % (ref 39.0–52.0)
Hemoglobin: 15.5 g/dL (ref 13.0–17.0)
LYMPHS PCT: 26 %
Lymphs Abs: 2.2 10*3/uL (ref 0.7–4.0)
MCH: 33 pg (ref 26.0–34.0)
MCHC: 36.9 g/dL — ABNORMAL HIGH (ref 30.0–36.0)
MCV: 89.4 fL (ref 78.0–100.0)
Monocytes Absolute: 0.8 10*3/uL (ref 0.1–1.0)
Monocytes Relative: 9 %
NEUTROS ABS: 5.2 10*3/uL (ref 1.7–7.7)
NEUTROS PCT: 62 %
PLATELETS: 263 10*3/uL (ref 150–400)
RBC: 4.7 MIL/uL (ref 4.22–5.81)
RDW: 12.7 % (ref 11.5–15.5)
WBC: 8.4 10*3/uL (ref 4.0–10.5)

## 2016-11-15 LAB — COMPREHENSIVE METABOLIC PANEL
ALT: 25 U/L (ref 17–63)
AST: 39 U/L (ref 15–41)
Albumin: 3.7 g/dL (ref 3.5–5.0)
Alkaline Phosphatase: 70 U/L (ref 38–126)
Anion gap: 14 (ref 5–15)
BUN: 28 mg/dL — ABNORMAL HIGH (ref 6–20)
CHLORIDE: 89 mmol/L — AB (ref 101–111)
CO2: 23 mmol/L (ref 22–32)
Calcium: 9.4 mg/dL (ref 8.9–10.3)
Creatinine, Ser: 1.38 mg/dL — ABNORMAL HIGH (ref 0.61–1.24)
Glucose, Bld: 690 mg/dL (ref 65–99)
POTASSIUM: 5.3 mmol/L — AB (ref 3.5–5.1)
SODIUM: 126 mmol/L — AB (ref 135–145)
Total Bilirubin: 1.2 mg/dL (ref 0.3–1.2)
Total Protein: 8 g/dL (ref 6.5–8.1)

## 2016-11-15 LAB — I-STAT TROPONIN, ED: TROPONIN I, POC: 0 ng/mL (ref 0.00–0.08)

## 2016-11-15 LAB — CBG MONITORING, ED
GLUCOSE-CAPILLARY: 552 mg/dL — AB (ref 65–99)
GLUCOSE-CAPILLARY: 503 mg/dL — AB (ref 65–99)
GLUCOSE-CAPILLARY: 465 mg/dL — AB (ref 65–99)
Glucose-Capillary: 595 mg/dL (ref 65–99)

## 2016-11-15 IMAGING — CR DG CHEST 2V
2 series · 2 of 2 positions shown · non-contrast
Comparison: Chest x-ray dated [DATE]. Chest x-ray dated
[DATE].

CLINICAL DATA: Shortness of breath and right arm pain today. Pain
is progressively worsening. Ex-smoker.

EXAM:
CHEST  2 VIEW

[w chest pa]
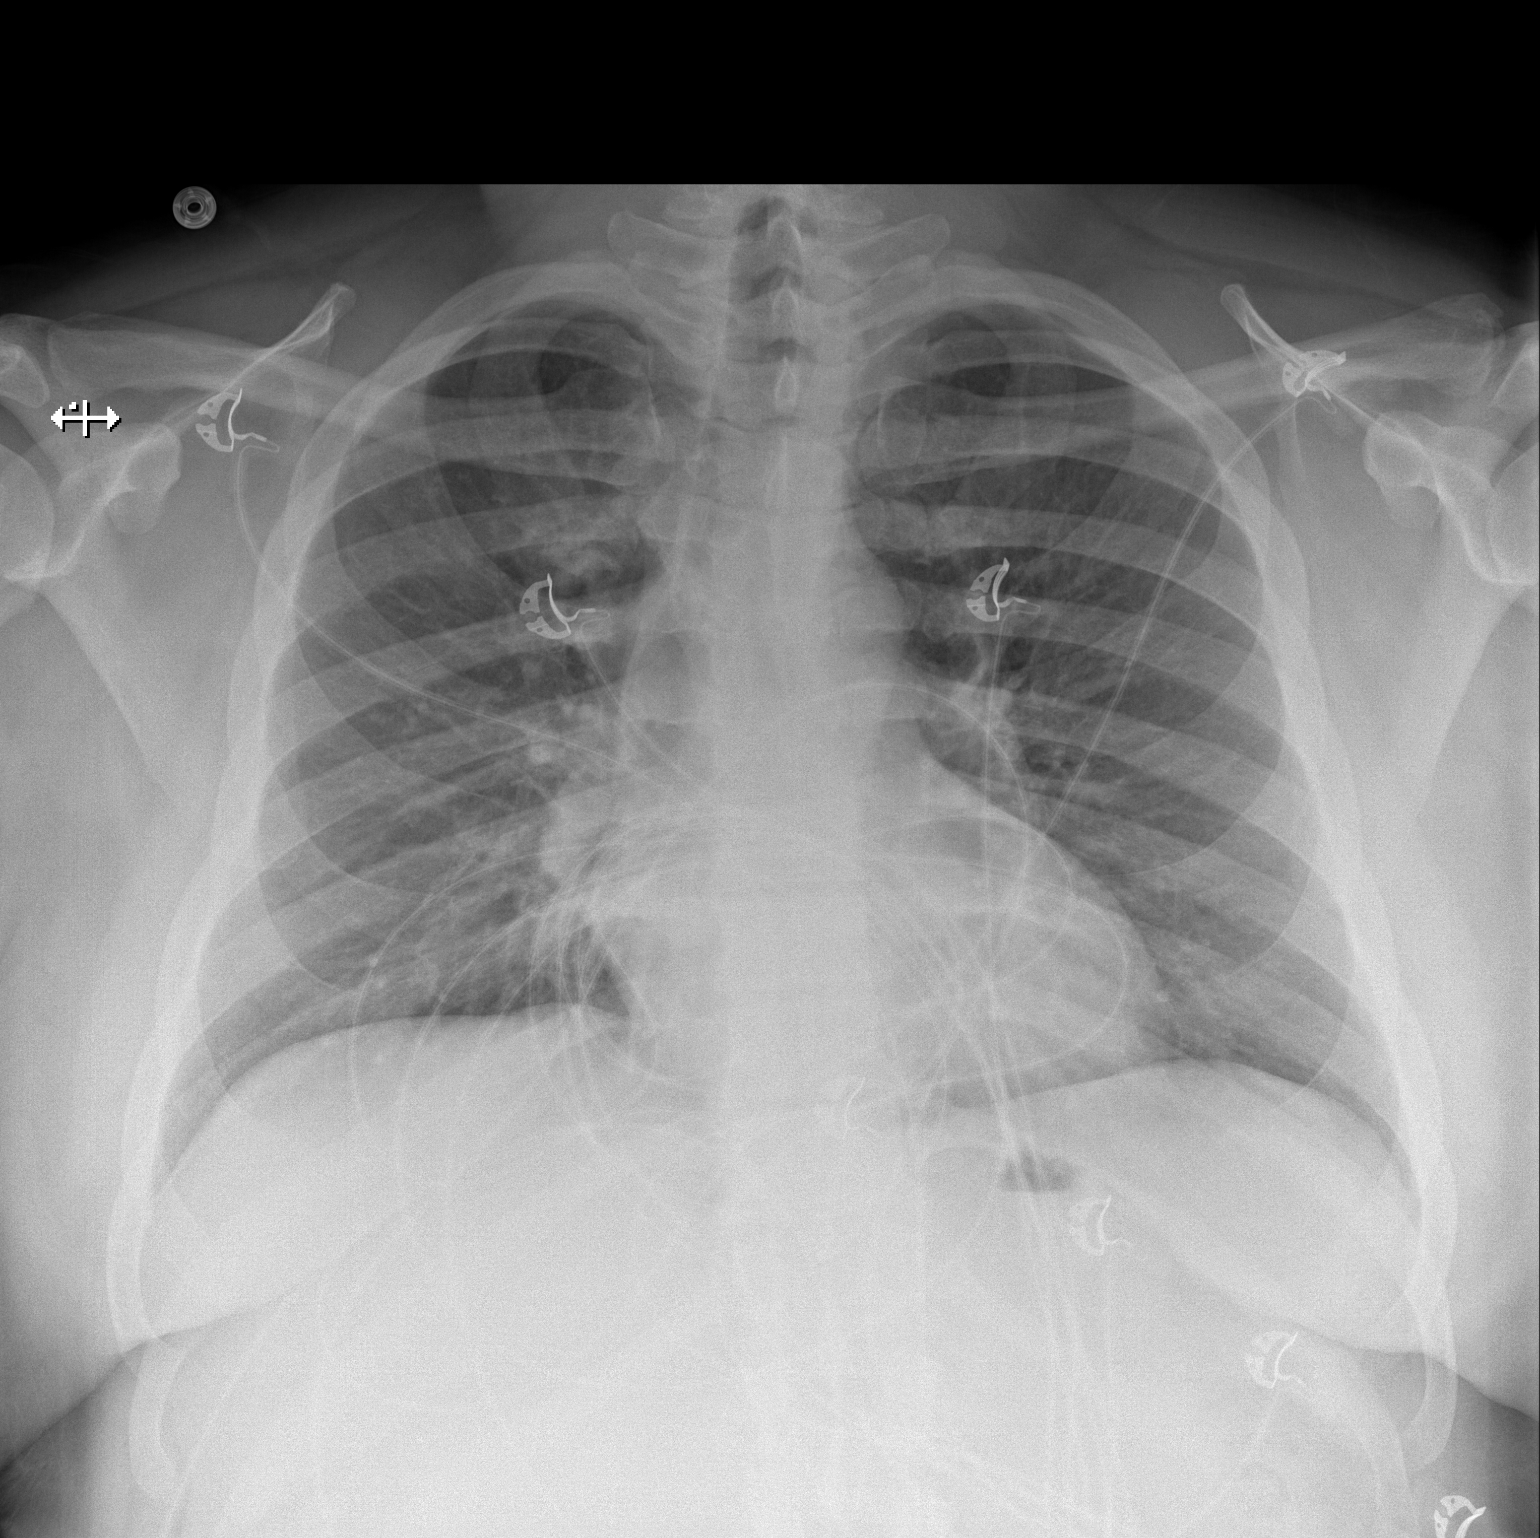

[w chest lat]
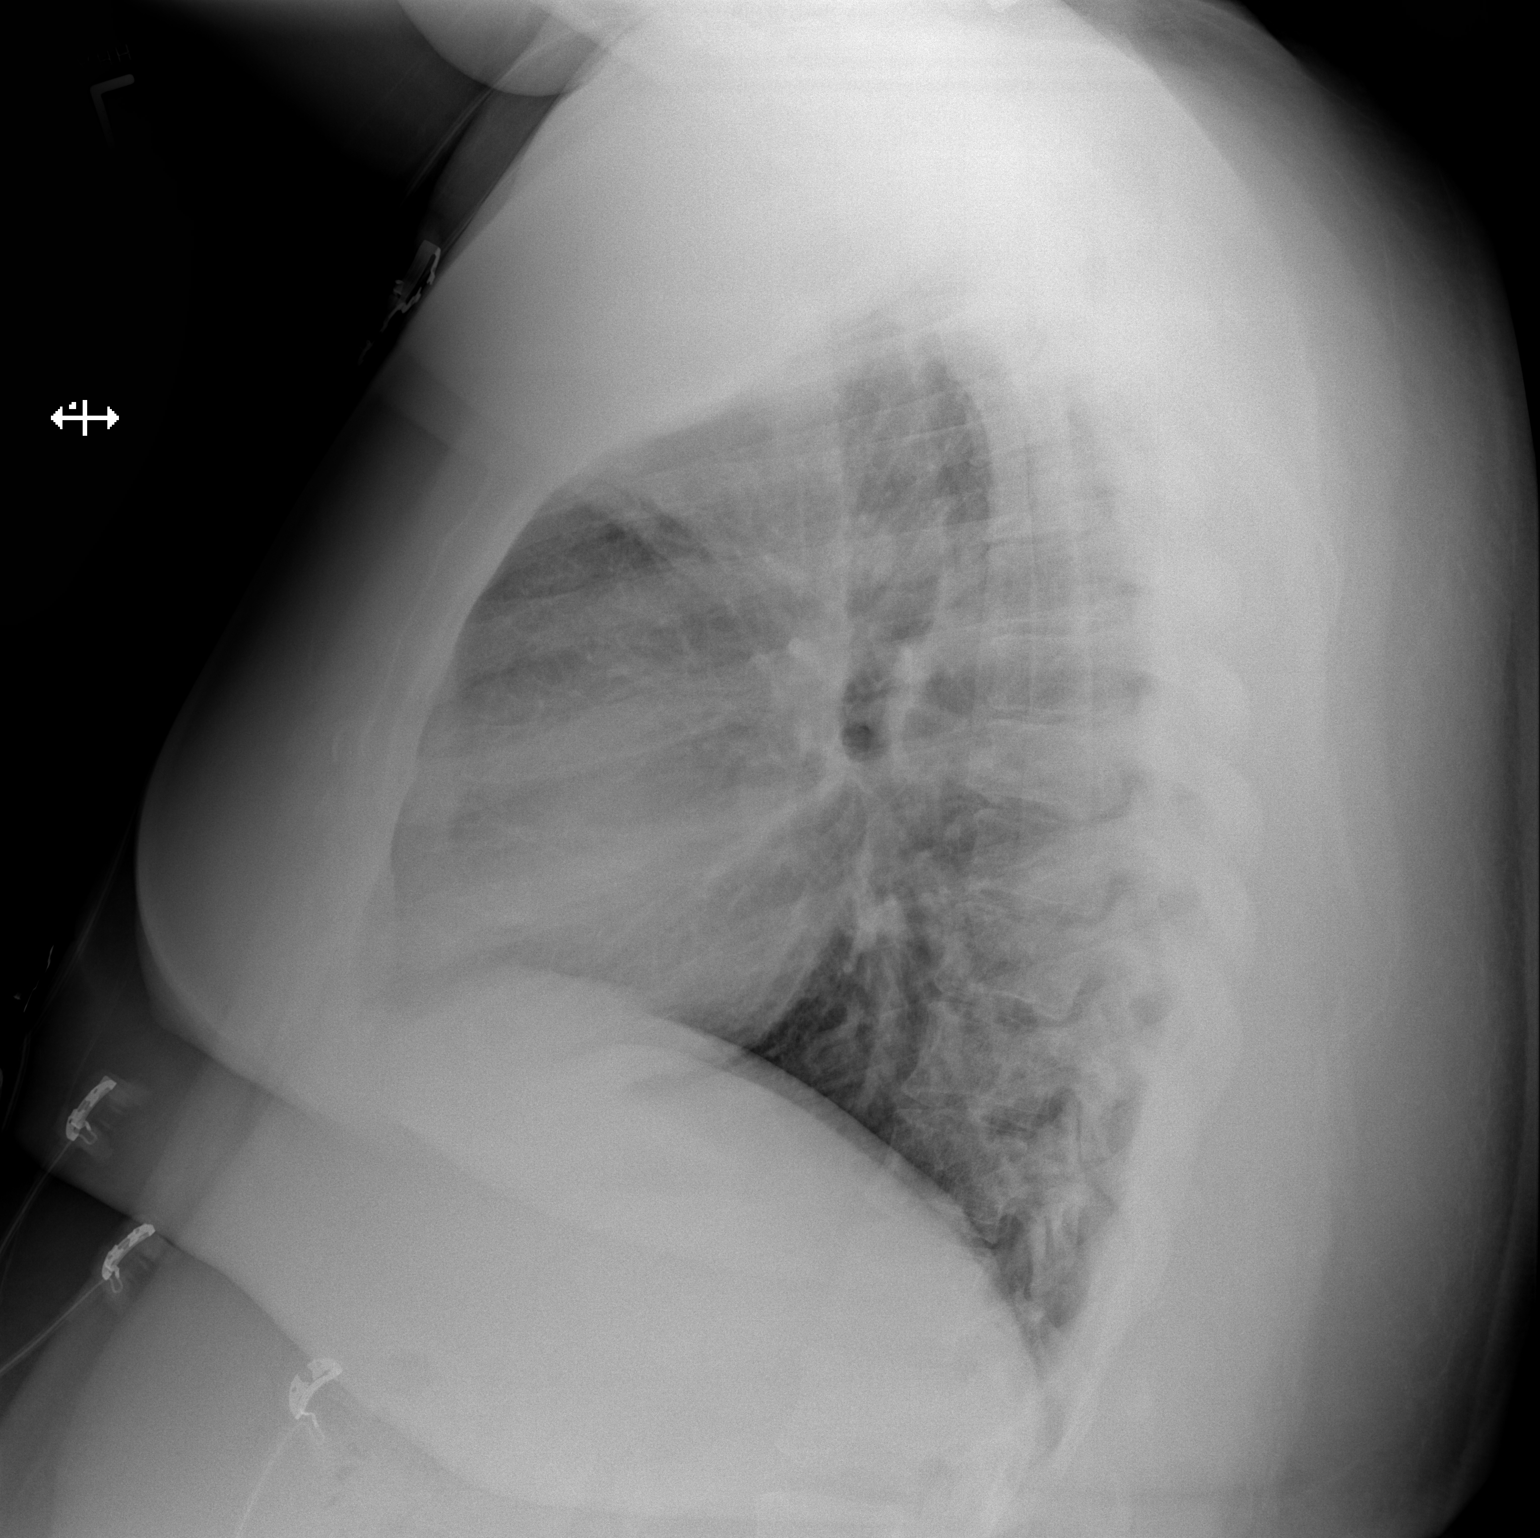

[2 of 2 positions shown; findings below may reference images not displayed]

FINDINGS: Heart size and mediastinal contours are normal. Lungs are clear. No
pleural effusion or pneumothorax seen. No acute or suspicious
osseous finding.
IMPRESSION: No active cardiopulmonary disease. No evidence of pneumonia or
pulmonary edema.

## 2016-11-15 MED ORDER — SODIUM CHLORIDE 0.9 % IV BOLUS (SEPSIS)
1000.0000 mL | Freq: Once | INTRAVENOUS | Status: AC
  Administered 2016-11-15: 1000 mL via INTRAVENOUS

## 2016-11-15 MED ORDER — INSULIN ASPART PROT & ASPART (70-30 MIX) 100 UNIT/ML ~~LOC~~ SUSP
20.0000 [IU] | Freq: Once | SUBCUTANEOUS | Status: AC
  Administered 2016-11-15: 20 [IU] via SUBCUTANEOUS

## 2016-11-15 MED ORDER — OXYCODONE-ACETAMINOPHEN 5-325 MG PO TABS
1.0000 | ORAL_TABLET | Freq: Once | ORAL | Status: AC
  Administered 2016-11-15: 1 via ORAL
  Filled 2016-11-15: qty 1

## 2016-11-15 MED ORDER — METFORMIN HCL 500 MG PO TABS
1000.0000 mg | ORAL_TABLET | Freq: Once | ORAL | Status: AC
  Administered 2016-11-15: 1000 mg via ORAL
  Filled 2016-11-15: qty 2

## 2016-11-15 MED ORDER — INSULIN ASPART PROT & ASPART (70-30 MIX) 100 UNIT/ML ~~LOC~~ SUSP
10.0000 [IU] | Freq: Once | SUBCUTANEOUS | Status: AC
  Administered 2016-11-15: 10 [IU] via SUBCUTANEOUS
  Filled 2016-11-15: qty 10

## 2016-11-15 MED ORDER — HYDROCODONE-ACETAMINOPHEN 5-325 MG PO TABS: 1.0000 | ORAL_TABLET | Freq: Four times a day (QID) | ORAL | 0 refills | Status: DC | PRN

## 2016-11-15 MED ORDER — INSULIN NPH ISOPHANE & REGULAR (70-30) 100 UNIT/ML ~~LOC~~ SUSP: 50.0000 [IU] | Freq: Two times a day (BID) | SUBCUTANEOUS | 0 refills | Status: DC

## 2016-11-15 NOTE — ED Notes (Signed)
Bed: WTR5 Expected date:  Expected time:  Means of arrival:  Comments: 

## 2016-11-15 NOTE — ED Notes (Signed)
Attempted to place IV, patient moved arm-IV unsuccessful

## 2016-11-15 NOTE — Discharge Instructions (Signed)
Continue to wear a brace.  Pain medications as prescribed as needed for severe pain.  Take insulin 2 help to control your blood sugar.  Follow-up with your doctor for better blood sugar control and for further treatment of your wrist. Return if worsening symptoms.

## 2016-11-15 NOTE — ED Provider Notes (Signed)
[facility]-EMERGENCY DEPT Provider Note   CSN: [id number] Arrival date & time: 11/15/16  [id number]     History   Chief Complaint Chief Complaint  Patient presents with  . Wrist Pain    HPI Barry [name] is a 41 y.o. male.  HPI Barry [name] is a 41 y.o. male with hx of carpal tunnel, DM, presents to ED with complaint of right wrist pain. Pt states he has been diagnosed with carpal tunnel, states unable to afford surgery, but has been receiving steroid injections. States last injection 3 months ago. Pain flared up few days ago. Taking NSAIDs and splinting with no relief. States he is here for pain management.     Pt also complaining of chest tightness that started two days ago. Reports tightness is in the center of the chest. Reports associated SOB, also worse on exertion. Tried moms inhaler which has helped some. Denies cough or URI symptoms. No prior cardiac hx. No family hx of heard disease. Pt is not a smoker.  Pt also states he has been non compliant with his insulin, states he has not been able to afford it. States recently checked labs by endocrinology, sugar greater than 600 and A1C 14. States his medications currently at the pharmacy.   Past Medical History:  Diagnosis Date  . Carpal tunnel syndrome   . Diabetes mellitus without complication (HCC)   . Hypercholesteremia   . Neuropathy   . Obesity     Patient Active Problem List   Diagnosis Date Noted  . HYPOTHYROIDISM 05/31/2008  . DYSLIPIDEMIA 05/31/2008  . OBESITY, MORBID 05/31/2008  . MYOCARDIAL PERFUSION SCAN, WITH STRESS TEST, ABNORMAL 05/31/2008    Past Surgical History:  Procedure Laterality Date  . FRACTURE SURGERY    . INCISE AND DRAIN ABCESS    . TONSILLECTOMY         Home Medications    Prior to Admission medications   Medication Sig Start Date End Date Taking? Authorizing Provider  atorvastatin (LIPITOR) 10 MG tablet Take 10 mg by mouth daily.    [provider]    canagliflozin (INVOKANA) 300 MG TABS tablet Take 300 mg by mouth daily before breakfast.    [provider]  colchicine 0.6 MG tablet Take 1 tablet (0.6 mg total) by mouth daily. 08/25/16   Joy, Shawn C, PA-C  cyclobenzaprine (FLEXERIL) 10 MG tablet Take 1 tablet (10 mg total) by mouth 2 (two) times daily as needed for muscle spasms. 08/18/16   Fayrene Helperran, [place], PA-C  exenatide (BYETTA) 10 MCG/0.04ML SOPN injection Inject 10 mcg into the skin 2 (two) times daily with a meal.    [provider]  HYDROcodone-acetaminophen (NORCO/VICODIN) 5-325 MG tablet Take 1-2 tablets by mouth every 4 (four) hours as needed for severe pain. 08/25/16   Joy, Shawn C, PA-C  insulin NPH-regular Human (NOVOLIN 70/30) (70-30) 100 UNIT/ML injection Inject 60 Units into the skin 2 (two) times daily with a meal.    [provider]  levothyroxine (SYNTHROID, LEVOTHROID) 75 MCG tablet Take 75 mcg by mouth daily before breakfast.    [provider]  lisinopril (PRINIVIL,ZESTRIL) 10 MG tablet Take 10 mg by mouth daily.    [provider]  metFORMIN (GLUCOPHAGE) 1000 MG tablet Take 1,000 mg by mouth 2 (two) times daily with a meal.    [provider]  morphine (MS CONTIN) 15 MG 12 hr tablet Take 15 mg by mouth every 12 (twelve) hours.    [provider]  oxyCODONE-acetaminophen (PERCOCET) 5-325 MG tablet Take 1 tablet by mouth every 4 (four) hours as needed. Patient not taking: Reported on 06/09/2015 05/03/15   Danelle Berry, PA-C  predniSONE (DELTASONE) 20 MG tablet 3 tabs po day one, then 2 tabs daily x 4 days 08/18/16   Fayrene Helper, PA-C  pregabalin (LYRICA) 200 MG capsule Take 200-400 mg by mouth 2 (two) times daily. Take 200mg  in the morning Take 400mg  in the evening    [provider]  testosterone cypionate (DEPOTESTOSTERONE CYPIONATE) 200 MG/ML injection Inject 200 mg into the muscle every 14 (fourteen) days.    [provider]    Family History No  family history on file.  Social History Social History  Substance Use Topics  . Smoking status: Former Games developer  . Smokeless tobacco: Never Used  . Alcohol use Yes     Allergies   Patient has no known allergies.   Review of Systems Review of Systems  Constitutional: Positive for fatigue. Negative for chills and fever.  Respiratory: Positive for chest tightness and shortness of breath. Negative for cough.   Cardiovascular: Positive for chest pain. Negative for palpitations and leg swelling.  Gastrointestinal: Negative for abdominal distention, abdominal pain, diarrhea, nausea and vomiting.  Genitourinary: Negative for dysuria, frequency, hematuria and urgency.  Musculoskeletal: Positive for arthralgias. Negative for myalgias, neck pain and neck stiffness.  Skin: Negative for rash.  Allergic/Immunologic: Negative for immunocompromised state.  Neurological: Positive for weakness and numbness. Negative for dizziness, light-headedness and headaches.  All other systems reviewed and are negative.    Physical Exam Updated Vital Signs BP (!) 144/80 (BP Location: Right Arm)   Pulse (!) 104   Temp 98 F (36.7 C) (Oral)   Resp 17   SpO2 95%   Physical Exam  Constitutional: He is oriented to person, place, and time. He appears well-developed and well-nourished. No distress.  HENT:  Head: Normocephalic and atraumatic.  Eyes: Conjunctivae are normal.  Neck: Neck supple.  Cardiovascular: Normal rate, regular rhythm and normal heart sounds.   Pulmonary/Chest: Effort normal. No respiratory distress. He has no wheezes. He has no rales.  Abdominal: Soft. Bowel sounds are normal. He exhibits no distension. There is no tenderness. There is no rebound.  Musculoskeletal: He exhibits no edema.  Normal appearing right wrist. ttp over anterior wrist joint. Pain with any ROM. Full ROM. Radial pulse intact. Grip strength 5/5.   Neurological: He is alert and oriented to person, place, and time.    Skin: Skin is warm and dry.  Nursing note and vitals reviewed.    ED Treatments / Results  Labs (all labs ordered are listed, but only abnormal results are displayed) Labs Reviewed  CBC WITH DIFFERENTIAL/PLATELET - Abnormal; Notable for the following:       Result Value   MCHC 36.9 (*)    All other components within normal limits  COMPREHENSIVE METABOLIC PANEL - Abnormal; Notable for the following:    Sodium 126 (*)    Potassium 5.3 (*)    Chloride 89 (*)    Glucose, Bld 690 (*)    BUN 28 (*)    Creatinine, Ser 1.38 (*)    All other components within normal limits  CBG MONITORING, ED - Abnormal; Notable for the following:    Glucose-Capillary 595 (*)    All other components within normal limits  CBG MONITORING, ED - Abnormal; Notable for the following:    Glucose-Capillary 552 (*)    All other components  within normal limits  CBG MONITORING, ED - Abnormal; Notable for the following:    Glucose-Capillary 503 (*)    All other components within normal limits  CBG MONITORING, ED - Abnormal; Notable for the following:    Glucose-Capillary 465 (*)    All other components within normal limits  I-STAT TROPONIN, ED    EKG  EKG Interpretation  Date/Time:  Saturday November 15 2016 09:39:12 EDT Ventricular Rate:  103 PR Interval:    QRS Duration: 99 QT Interval:  353 QTC Calculation: 463 R Axis:   -4 Text Interpretation:  Sinus tachycardia Probable left atrial enlargement Abnormal R-wave progression, late transition Confirmed by Lorre Nick ([id number]) on 11/15/2016 2:08:23 PM       Radiology Dg Chest 2 View  Result Date: 11/15/2016 CLINICAL DATA:  Shortness of breath and right arm pain today. Pain is progressively worsening. Ex-smoker. EXAM: CHEST  2 VIEW COMPARISON:  Chest x-ray dated 11/12/2015. Chest x-ray dated 05/03/2015. FINDINGS: Heart size and mediastinal contours are normal. Lungs are clear. No pleural effusion or pneumothorax seen. No acute or suspicious osseous  finding. IMPRESSION: No active cardiopulmonary disease. No evidence of pneumonia or pulmonary edema. Electronically Signed   By: Bary Richard M.D.   On: 11/15/2016 09:52    Procedures Procedures (including critical care time)  Medications Ordered in ED Medications - No data to display   Initial Impression / Assessment and Plan / ED Course  I have reviewed the triage vital signs and the nursing notes.  Pertinent labs & imaging results that were available during my care of the patient were reviewed by me and considered in my medical decision making (see chart for details).    Pt here for multiple complaints. Complaining of persistent and worsenng wrist pain. Already has a diagnosis of carpal tunnel. Advised pain meds for few days until able tofollow up with ortho. Pt requesting steroid shot, explained we do not perform these here. Pt agreed. Pts glucose is 600, will check labs, cxr, ecg, trop given chest tightness.    Pt's blood sugar is 552 after 10 units of insulin 2 L of fluids.  Will give 20 additional units of insulin and reassess.    Glucose is 503.  Will add more insulin and fluids.  Discussed with Dr. Freida Busman who agrees.  Patient's glucose is 465 after a total of 50 subcu insulin and 3 L of IV fluids.  Blood sugar is improving.  No evidence of DKA.  Vital signs are normal.  Will discharge home.  He is requesting another prescription for insulin which I will provide.  We will also give him some pain medicine until he is able to follow-up regarding his wrist pain.  I question whether he might have gout versus carpal tunnel in that wrist given amount of tenderness.  He does have history of gout.  Advised to follow-up closely to recheck his blood sugar and his wrist pain.  Vitals:   11/15/16 0840 11/15/16 1323 11/15/16 1500 11/15/16 1600  BP: (!) 144/80 (!) 126/91 (!) 120/92 120/83  Pulse:  93 88 84  Resp: 17 11 14 10   Temp:      TempSrc:      SpO2:  96% 97% 97%        Final  Clinical Impressions(s) / ED Diagnoses   Final diagnoses:  Hyperglycemia  Right wrist pain    New Prescriptions Discharge Medication List as of 11/15/2016  3:56 PM       Jaynie Crumble,  PA-C 11/16/16 1618    Lorre Nick, MD 11/17/16 2044

## 2016-11-15 NOTE — ED Triage Notes (Signed)
Per pt, states he has a history of carpal tunnel syndrome-states increased pain for 2 weeks-states he has not seen a surgeon wearing a brace

## 2016-11-15 NOTE — ED Notes (Signed)
Date and time results received: 11/15/16 1030 (use smartphrase ".now" to insert current time)  Test: Glucose Critical Value: 690  Name of Provider Notified: Tatyana  Orders Received? Or Actions Taken?: Actions Taken: Notified Research scientist (medical)Tatyana

## 2017-05-02 DIAGNOSIS — R079 Chest pain, unspecified: Secondary | ICD-10-CM

## 2017-05-03 DIAGNOSIS — I10 Essential (primary) hypertension: Secondary | ICD-10-CM

## 2017-05-03 DIAGNOSIS — M940 Chondrocostal junction syndrome [Tietze]: Secondary | ICD-10-CM

## 2017-05-03 DIAGNOSIS — E785 Hyperlipidemia, unspecified: Secondary | ICD-10-CM

## 2017-05-03 DIAGNOSIS — R0789 Other chest pain: Secondary | ICD-10-CM

## 2017-05-03 DIAGNOSIS — E119 Type 2 diabetes mellitus without complications: Secondary | ICD-10-CM

## 2017-05-03 DIAGNOSIS — Z9114 Patient's other noncompliance with medication regimen: Secondary | ICD-10-CM

## 2017-05-03 DIAGNOSIS — E039 Hypothyroidism, unspecified: Secondary | ICD-10-CM

## 2017-11-20 DIAGNOSIS — R809 Proteinuria, unspecified: Secondary | ICD-10-CM | POA: Diagnosis not present

## 2017-11-20 DIAGNOSIS — E1165 Type 2 diabetes mellitus with hyperglycemia: Secondary | ICD-10-CM | POA: Diagnosis not present

## 2017-11-20 DIAGNOSIS — E782 Mixed hyperlipidemia: Secondary | ICD-10-CM | POA: Diagnosis not present

## 2017-11-20 DIAGNOSIS — E1129 Type 2 diabetes mellitus with other diabetic kidney complication: Secondary | ICD-10-CM | POA: Diagnosis not present

## 2017-11-20 DIAGNOSIS — E039 Hypothyroidism, unspecified: Secondary | ICD-10-CM | POA: Diagnosis not present

## 2017-11-20 DIAGNOSIS — E1142 Type 2 diabetes mellitus with diabetic polyneuropathy: Secondary | ICD-10-CM | POA: Diagnosis not present

## 2017-11-20 DIAGNOSIS — Z794 Long term (current) use of insulin: Secondary | ICD-10-CM | POA: Diagnosis not present

## 2017-11-20 DIAGNOSIS — E113292 Type 2 diabetes mellitus with mild nonproliferative diabetic retinopathy without macular edema, left eye: Secondary | ICD-10-CM | POA: Diagnosis not present

## 2018-02-23 DIAGNOSIS — E782 Mixed hyperlipidemia: Secondary | ICD-10-CM | POA: Diagnosis not present

## 2018-02-23 DIAGNOSIS — E1142 Type 2 diabetes mellitus with diabetic polyneuropathy: Secondary | ICD-10-CM | POA: Diagnosis not present

## 2018-02-23 DIAGNOSIS — Z794 Long term (current) use of insulin: Secondary | ICD-10-CM | POA: Diagnosis not present

## 2018-02-23 DIAGNOSIS — E113292 Type 2 diabetes mellitus with mild nonproliferative diabetic retinopathy without macular edema, left eye: Secondary | ICD-10-CM | POA: Diagnosis not present

## 2018-02-23 DIAGNOSIS — E039 Hypothyroidism, unspecified: Secondary | ICD-10-CM | POA: Diagnosis not present

## 2018-03-09 DIAGNOSIS — M25562 Pain in left knee: Secondary | ICD-10-CM | POA: Diagnosis not present

## 2018-03-20 DIAGNOSIS — M79641 Pain in right hand: Secondary | ICD-10-CM | POA: Diagnosis not present

## 2018-03-20 DIAGNOSIS — M79642 Pain in left hand: Secondary | ICD-10-CM | POA: Diagnosis not present

## 2018-04-01 DIAGNOSIS — M25532 Pain in left wrist: Secondary | ICD-10-CM | POA: Diagnosis not present

## 2018-04-01 DIAGNOSIS — M25531 Pain in right wrist: Secondary | ICD-10-CM | POA: Diagnosis not present

## 2018-04-01 DIAGNOSIS — M25562 Pain in left knee: Secondary | ICD-10-CM | POA: Diagnosis not present

## 2018-04-23 DIAGNOSIS — E782 Mixed hyperlipidemia: Secondary | ICD-10-CM | POA: Diagnosis not present

## 2018-04-23 DIAGNOSIS — E039 Hypothyroidism, unspecified: Secondary | ICD-10-CM | POA: Diagnosis not present

## 2018-04-23 DIAGNOSIS — E1142 Type 2 diabetes mellitus with diabetic polyneuropathy: Secondary | ICD-10-CM | POA: Diagnosis not present

## 2018-04-23 DIAGNOSIS — Z794 Long term (current) use of insulin: Secondary | ICD-10-CM | POA: Diagnosis not present

## 2018-05-06 ENCOUNTER — Other Ambulatory Visit: Payer: Self-pay

## 2018-05-06 ENCOUNTER — Encounter (HOSPITAL_COMMUNITY): Payer: Self-pay

## 2018-05-06 ENCOUNTER — Ambulatory Visit (HOSPITAL_COMMUNITY)
Admission: EM | Admit: 2018-05-06 | Discharge: 2018-05-06 | Disposition: A | Discharge status: Home / Self Care | Payer: BLUE CROSS/BLUE SHIELD | Attending: Internal Medicine | Admitting: Internal Medicine

## 2018-05-06 DIAGNOSIS — K0889 Other specified disorders of teeth and supporting structures: Secondary | ICD-10-CM | POA: Diagnosis not present

## 2018-05-06 DIAGNOSIS — K047 Periapical abscess without sinus: Secondary | ICD-10-CM | POA: Diagnosis not present

## 2018-05-06 DIAGNOSIS — R220 Localized swelling, mass and lump, head: Secondary | ICD-10-CM

## 2018-05-06 MED ORDER — METHYLPREDNISOLONE SODIUM SUCC 125 MG IJ SOLR
INTRAMUSCULAR | Status: AC
  Filled 2018-05-06: qty 2

## 2018-05-06 MED ORDER — METHYLPREDNISOLONE SODIUM SUCC 125 MG IJ SOLR
125.0000 mg | Freq: Once | INTRAMUSCULAR | Status: AC
  Administered 2018-05-06: 20:00:00 125 mg via INTRAMUSCULAR

## 2018-05-06 MED ORDER — AMOXICILLIN-POT CLAVULANATE 875-125 MG PO TABS: 1.0000 | ORAL_TABLET | Freq: Two times a day (BID) | ORAL | 0 refills | Status: AC

## 2018-05-06 MED ORDER — CEFTRIAXONE SODIUM 1 G IJ SOLR
INTRAMUSCULAR | Status: AC
  Filled 2018-05-06: qty 10

## 2018-05-06 MED ORDER — CEFTRIAXONE SODIUM 1 G IJ SOLR
1.0000 g | Freq: Once | INTRAMUSCULAR | Status: AC
  Administered 2018-05-06: 1 g via INTRAMUSCULAR

## 2018-05-06 NOTE — ED Provider Notes (Signed)
[facility]   [id number] 05/06/18 Arrival Time: 1825  CC: DENTAL PAIN and swelling  SUBJECTIVE:  Barry [name] is a 43 y.o. male hx significant for CTS, DM, high cholesterol, neuropathy, and obesity, who presents with left cheek swelling and dental pain x 1 day.  Denies a precipitating event or trauma.  Localizes pain to left upper jaw and left cheek.  Has NOT tried OTC medications.  Denies similar symptoms in the past.  Denies fever, chills, dysphagia, odynophagia, neck swelling, nausea, vomiting, chest pain, SOB, dyspnea.    ROS: As per HPI.  Past Medical History:  Diagnosis Date  . Carpal tunnel syndrome   . Diabetes mellitus without complication (HCC)   . Hypercholesteremia   . Neuropathy   . Obesity    Past Surgical History:  Procedure Laterality Date  . FRACTURE SURGERY    . INCISE AND DRAIN ABCESS    . TONSILLECTOMY     No Known Allergies No current facility-administered medications on file prior to encounter.    Current Outpatient Medications on File Prior to Encounter  Medication Sig Dispense Refill  . atorvastatin (LIPITOR) 80 MG tablet Take 80 mg by mouth every morning.    . hydrochlorothiazide (HYDRODIURIL) 25 MG tablet Take 25 mg by mouth daily.    Marland Kitchen levothyroxine (SYNTHROID, LEVOTHROID) 75 MCG tablet Take 75 mcg by mouth daily before breakfast.    . metFORMIN (GLUCOPHAGE) 1000 MG tablet Take 1,000 mg by mouth 2 (two) times daily with a meal.    . atorvastatin (LIPITOR) 80 MG tablet Take 80 mg by mouth daily.    . colchicine 0.6 MG tablet Take 1 tablet (0.6 mg total) by mouth daily. (Patient not taking: Reported on 11/15/2016) 8 tablet 0  . cyclobenzaprine (FLEXERIL) 10 MG tablet Take 1 tablet (10 mg total) by mouth 2 (two) times daily as needed for muscle spasms. 20 tablet 0  . HYDROcodone-acetaminophen (NORCO/VICODIN) 5-325 MG tablet Take 1 tablet by mouth every 6 (six) hours as needed for moderate pain. 10 tablet 0  . insulin NPH-regular Human  (NOVOLIN 70/30) (70-30) 100 UNIT/ML injection Inject 50 Units into the skin 2 (two) times daily with a meal. 10 mL 0  . lisinopril (PRINIVIL,ZESTRIL) 10 MG tablet Take 10 mg by mouth daily.    . naproxen sodium (ANAPROX) 220 MG tablet Take 440 mg by mouth 2 (two) times daily with a meal.    . oxyCODONE-acetaminophen (PERCOCET) 5-325 MG tablet Take 1 tablet by mouth every 4 (four) hours as needed. (Patient not taking: Reported on 06/09/2015) 20 tablet 0  . predniSONE (DELTASONE) 20 MG tablet 3 tabs po day one, then 2 tabs daily x 4 days (Patient not taking: Reported on 11/15/2016) 11 tablet 0  . vitamin B-12 (CYANOCOBALAMIN) 1000 MCG tablet Take 1,000 mcg by mouth daily.     Social History   Socioeconomic History  . Marital status: Legally Separated    Spouse name: Not on file  . Number of children: Not on file  . Years of education: Not on file  . Highest education level: Not on file  Occupational History  . Not on file  Social Needs  . Financial resource strain: Not on file  . Food insecurity:    Worry: Not on file    Inability: Not on file  . Transportation needs:    Medical: Not on file    Non-medical: Not on file  Tobacco Use  . Smoking status: Former Games developer  . Smokeless tobacco:  Never Used  Substance and Sexual Activity  . Alcohol use: Yes  . Drug use: No  . Sexual activity: Not on file  Lifestyle  . Physical activity:    Days per week: Not on file    Minutes per session: Not on file  . Stress: Not on file  Relationships  . Social connections:    Talks on phone: Not on file    Gets together: Not on file    Attends religious service: Not on file    Active member of club or organization: Not on file    Attends meetings of clubs or organizations: Not on file    Relationship status: Not on file  . Intimate partner violence:    Fear of current or ex partner: Not on file    Emotionally abused: Not on file    Physically abused: Not on file    Forced sexual activity: Not  on file  Other Topics Concern  . Not on file  Social History Narrative  . Not on file   History reviewed. No pertinent family history.  OBJECTIVE:  Vitals:   05/06/18 1851  BP: 128/82  Pulse: 99  Resp: 16  Temp: 98.1 F (36.7 C)  SpO2: 95%    General appearance: alert; no distress HENT: normocephalic; atraumatic; EACs clear, TMs pearly gray; nares patent without rhinorrhea; dentition: multiple cavities; swollen over left upper gums without areas of fluctuance; swelling over infraorbital region of left cheek, no fluctuance, but possible induration; no overlying erythema or skin changes, TTP; oropharynx clear, tonsils not erythematous or swollen, uvula midline; tolerating own secretions Neck: supple without LAD Lungs: normal respirations Skin: warm and dry Psychological: alert and cooperative; normal mood and affect  ASSESSMENT & PLAN:  1. Pain, dental   2. Facial swelling   3. Dental infection     Meds ordered this encounter  Medications  . methylPREDNISolone sodium succinate (SOLU-MEDROL) 125 mg/2 mL injection 125 mg  . cefTRIAXone (ROCEPHIN) injection 1 g  . amoxicillin-clavulanate (AUGMENTIN) 875-125 MG tablet    Sig: Take 1 tablet by mouth every 12 (twelve) hours for 10 days.    Dispense:  20 tablet    Refill:  0    Order Specific Question:   Supervising Provider    Answer:   Eustace MooreELSON, YVONNE SUE [[id number]]   Steroid shot given in office Antibiotic shot given in office Augmentin prescribed.  Take as directed and to completion Use OTC ibuprofen and/or tylenol as needed for pain Go to the ED if you have any new or worsening symptoms such as fever, chills, difficulty swallowing, painful swallowing, oral or neck swelling, nausea, vomiting, chest pain, shortness of breath, difficulty breathing, symptoms that do not improve with medications, etc...  Reviewed expectations re: course of current medical issues. Questions answered. Outlined signs and symptoms indicating need  for more acute intervention. Patient verbalized understanding. After Visit Summary given.   Rennis HardingWurst, Gildo Crisco, PA-C 05/06/18 1949

## 2018-05-06 NOTE — Discharge Instructions (Addendum)
Steroid shot given in office Antibiotic shot given in office Augmentin prescribed.  Take as directed and to completion Use OTC ibuprofen and/or tylenol as needed for pain Go to the ED if you have any new or worsening symptoms such as fever, chills, difficulty swallowing, painful swallowing, worsening oral or neck swelling, nausea, vomiting, chest pain, shortness of breath, difficulty breathing, symptoms that do not improve with medications, etc..Marland Kitchen

## 2018-05-06 NOTE — ED Notes (Signed)
Patient verbalizes understanding of discharge instructions. Opportunity for questioning and answers were provided. Patient discharged from [facility] by RN.  

## 2018-05-06 NOTE — ED Triage Notes (Signed)
Facial swelling began yesterday , painful to touch

## 2018-06-27 ENCOUNTER — Emergency Department (HOSPITAL_COMMUNITY): Payer: BC Managed Care – PPO

## 2018-06-27 ENCOUNTER — Other Ambulatory Visit: Payer: Self-pay

## 2018-06-27 ENCOUNTER — Emergency Department (HOSPITAL_COMMUNITY)
Admission: EM | Admit: 2018-06-27 | Discharge: 2018-06-27 | Disposition: A | Discharge status: Home / Self Care | Payer: BC Managed Care – PPO | Attending: Emergency Medicine | Admitting: Emergency Medicine

## 2018-06-27 ENCOUNTER — Encounter (HOSPITAL_COMMUNITY): Payer: Self-pay | Admitting: *Deleted

## 2018-06-27 ENCOUNTER — Emergency Department (HOSPITAL_BASED_OUTPATIENT_CLINIC_OR_DEPARTMENT_OTHER): Payer: BC Managed Care – PPO

## 2018-06-27 DIAGNOSIS — Z79899 Other long term (current) drug therapy: Secondary | ICD-10-CM | POA: Diagnosis not present

## 2018-06-27 DIAGNOSIS — E039 Hypothyroidism, unspecified: Secondary | ICD-10-CM | POA: Diagnosis not present

## 2018-06-27 DIAGNOSIS — R0602 Shortness of breath: Secondary | ICD-10-CM | POA: Diagnosis not present

## 2018-06-27 DIAGNOSIS — J189 Pneumonia, unspecified organism: Secondary | ICD-10-CM | POA: Insufficient documentation

## 2018-06-27 DIAGNOSIS — G5602 Carpal tunnel syndrome, left upper limb: Secondary | ICD-10-CM | POA: Insufficient documentation

## 2018-06-27 DIAGNOSIS — K8020 Calculus of gallbladder without cholecystitis without obstruction: Secondary | ICD-10-CM | POA: Diagnosis not present

## 2018-06-27 DIAGNOSIS — R079 Chest pain, unspecified: Secondary | ICD-10-CM

## 2018-06-27 DIAGNOSIS — M79602 Pain in left arm: Secondary | ICD-10-CM | POA: Diagnosis not present

## 2018-06-27 DIAGNOSIS — R1011 Right upper quadrant pain: Secondary | ICD-10-CM

## 2018-06-27 DIAGNOSIS — E1140 Type 2 diabetes mellitus with diabetic neuropathy, unspecified: Secondary | ICD-10-CM | POA: Diagnosis not present

## 2018-06-27 DIAGNOSIS — Z87891 Personal history of nicotine dependence: Secondary | ICD-10-CM | POA: Diagnosis not present

## 2018-06-27 DIAGNOSIS — Z8719 Personal history of other diseases of the digestive system: Secondary | ICD-10-CM | POA: Diagnosis not present

## 2018-06-27 DIAGNOSIS — R609 Edema, unspecified: Secondary | ICD-10-CM

## 2018-06-27 DIAGNOSIS — Z20828 Contact with and (suspected) exposure to other viral communicable diseases: Secondary | ICD-10-CM | POA: Diagnosis not present

## 2018-06-27 DIAGNOSIS — Z7984 Long term (current) use of oral hypoglycemic drugs: Secondary | ICD-10-CM | POA: Diagnosis not present

## 2018-06-27 HISTORY — DX: Disorder of thyroid, unspecified: E07.9

## 2018-06-27 LAB — CBC WITH DIFFERENTIAL/PLATELET
Abs Immature Granulocytes: 0.03 10*3/uL (ref 0.00–0.07)
Basophils Absolute: 0 10*3/uL (ref 0.0–0.1)
Basophils Relative: 0 %
Eosinophils Absolute: 0.2 10*3/uL (ref 0.0–0.5)
Eosinophils Relative: 2 %
HCT: 38.6 % — ABNORMAL LOW (ref 39.0–52.0)
Hemoglobin: 12.9 g/dL — ABNORMAL LOW (ref 13.0–17.0)
Immature Granulocytes: 0 %
Lymphocytes Relative: 18 %
Lymphs Abs: 1.9 10*3/uL (ref 0.7–4.0)
MCH: 30.4 pg (ref 26.0–34.0)
MCHC: 33.4 g/dL (ref 30.0–36.0)
MCV: 91 fL (ref 80.0–100.0)
Monocytes Absolute: 1.5 10*3/uL — ABNORMAL HIGH (ref 0.1–1.0)
Monocytes Relative: 14 %
Neutro Abs: 7.1 10*3/uL (ref 1.7–7.7)
Neutrophils Relative %: 66 %
Platelets: 264 10*3/uL (ref 150–400)
RBC: 4.24 MIL/uL (ref 4.22–5.81)
RDW: 14 % (ref 11.5–15.5)
WBC: 10.7 10*3/uL — ABNORMAL HIGH (ref 4.0–10.5)
nRBC: 0 % (ref 0.0–0.2)

## 2018-06-27 LAB — COMPREHENSIVE METABOLIC PANEL
ALT: 26 U/L (ref 0–44)
AST: 23 U/L (ref 15–41)
Albumin: 3.1 g/dL — ABNORMAL LOW (ref 3.5–5.0)
Alkaline Phosphatase: 44 U/L (ref 38–126)
Anion gap: 10 (ref 5–15)
BUN: 19 mg/dL (ref 6–20)
CO2: 25 mmol/L (ref 22–32)
Calcium: 8.7 mg/dL — ABNORMAL LOW (ref 8.9–10.3)
Chloride: 101 mmol/L (ref 98–111)
Creatinine, Ser: 1.48 mg/dL — ABNORMAL HIGH (ref 0.61–1.24)
GFR calc Af Amer: >60 mL/min (ref >60)
GFR calc non Af Amer: 58 mL/min — ABNORMAL LOW (ref >60)
Glucose, Bld: 140 mg/dL — ABNORMAL HIGH (ref 70–99)
Potassium: 3.9 mmol/L (ref 3.5–5.1)
Sodium: 136 mmol/L (ref 135–145)
Total Bilirubin: 1.3 mg/dL — ABNORMAL HIGH (ref 0.3–1.2)
Total Protein: 6.9 g/dL (ref 6.5–8.1)

## 2018-06-27 LAB — LIPASE, BLOOD: Lipase: 23 U/L (ref 11–51)

## 2018-06-27 LAB — TROPONIN I: Troponin I: <0.03 ng/mL (ref <0.03)

## 2018-06-27 LAB — D-DIMER, QUANTITATIVE: D-Dimer, Quant: 1.76 ug/mL-FEU — ABNORMAL HIGH (ref 0.00–0.50)

## 2018-06-27 LAB — SARS CORONAVIRUS 2 BY RT PCR (HOSPITAL ORDER, PERFORMED IN ~~LOC~~ HOSPITAL LAB): SARS Coronavirus 2: NEGATIVE

## 2018-06-27 IMAGING — CT CT ANGIOGRAPHY CHEST
2 of 9 series · 18 of 46 positions shown · IV contrast (omnipaque)
Comparison: [DATE] CT angiogram chest and chest
radiograph [DATE]

CLINICAL DATA: Chest pain and shortness of breath

EXAM:
CT ANGIOGRAPHY CHEST WITH CONTRAST
TECHNIQUE: Multidetector CT imaging of the chest was performed using the
standard protocol during bolus administration of intravenous
contrast. Multiplanar CT image reconstructions and MIPs were
obtained to evaluate the vascular anatomy.
CONTRAST:  100mL OMNIPAQUE IOHEXOL 350 MG/ML SOLN

[Series 7: thins · axial · 0.80mm/px · z∈[+1040,+1278]mm · 15 of 263 slices shown]
[im 13/263  lung]
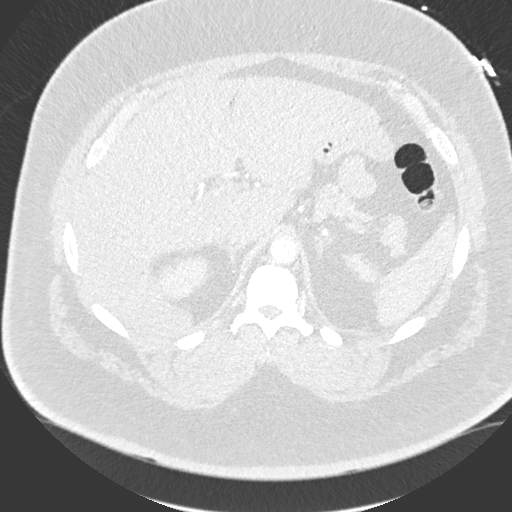
[im 38/263  soft-tissue]
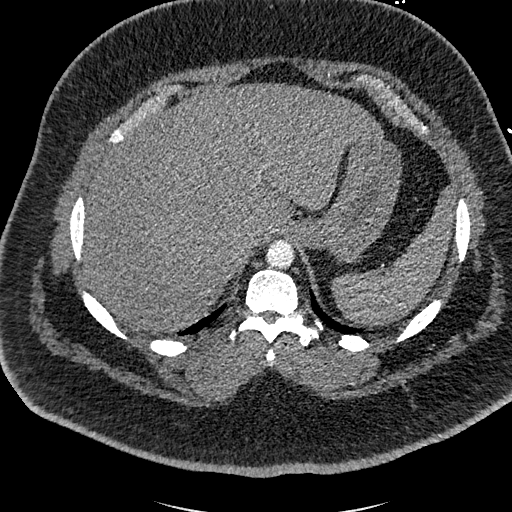
[im 50/263  lung]
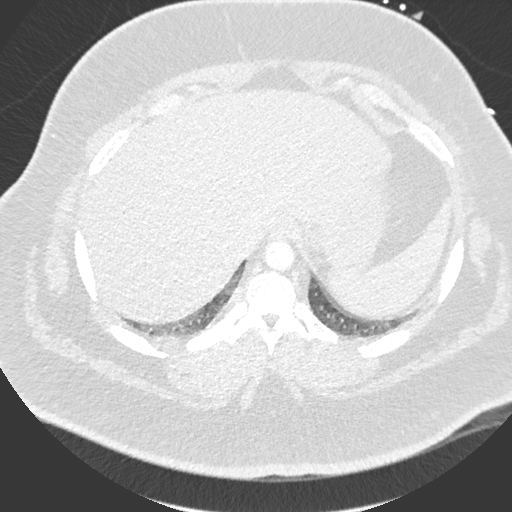
[im 63/263  soft-tissue]
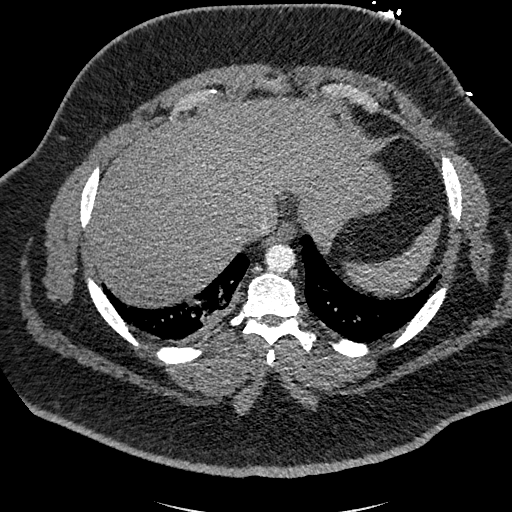
[im 88/263  lung]
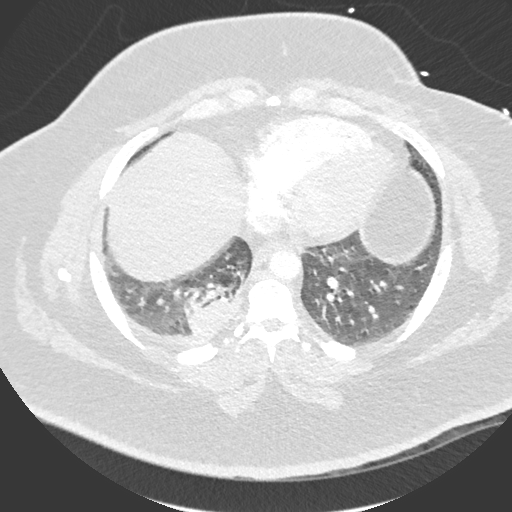
[im 100/263  soft-tissue]
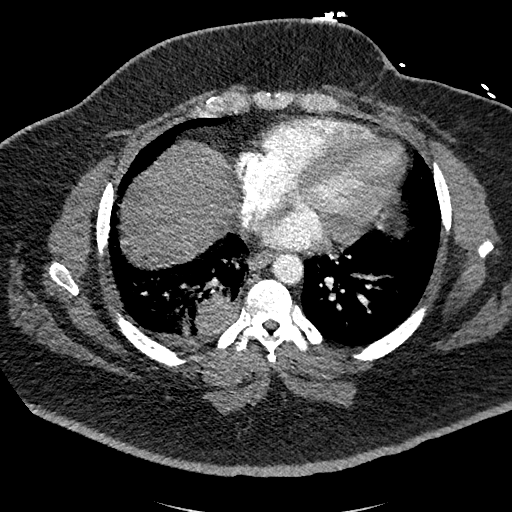
[im 113/263  lung]
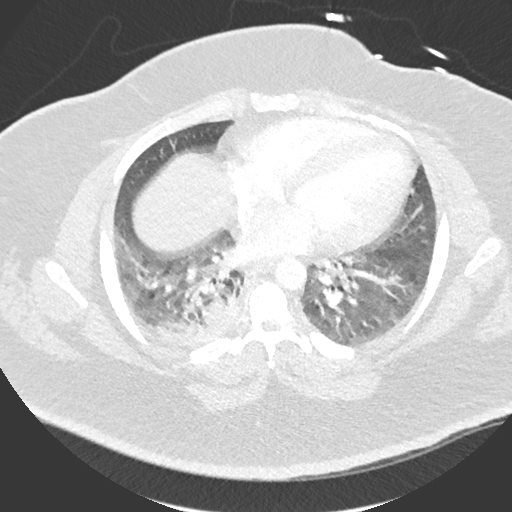
[im 138/263  soft-tissue]
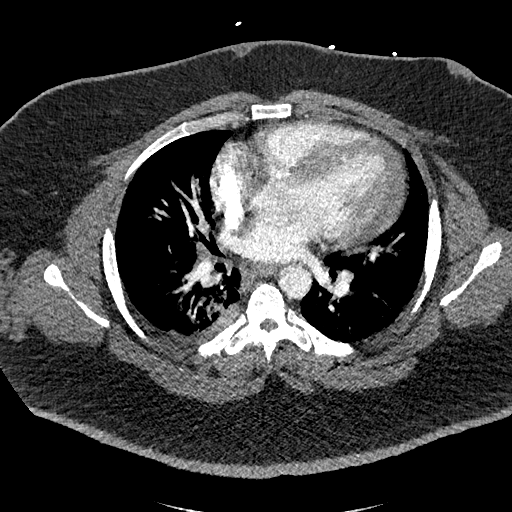
[im 150/263  lung]
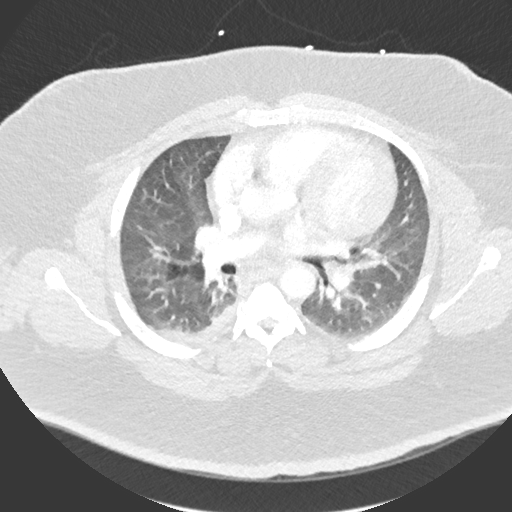
[im 163/263  soft-tissue]
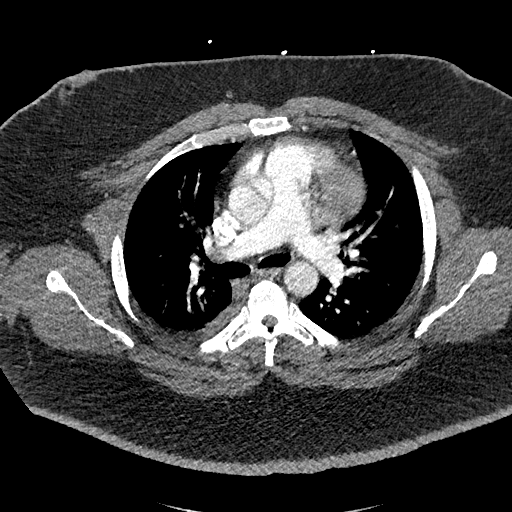
[im 188/263  lung]
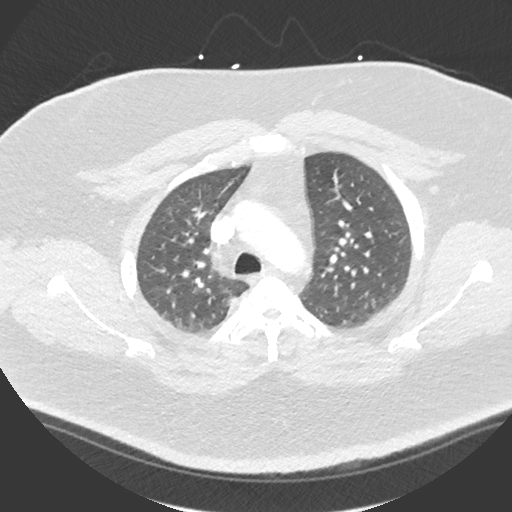
[im 200/263  soft-tissue]
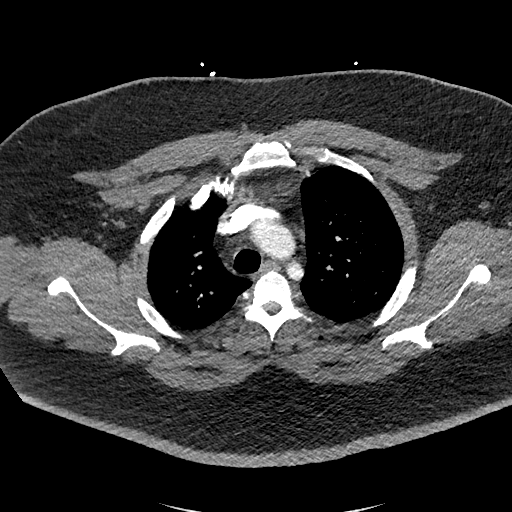
[im 213/263  lung]
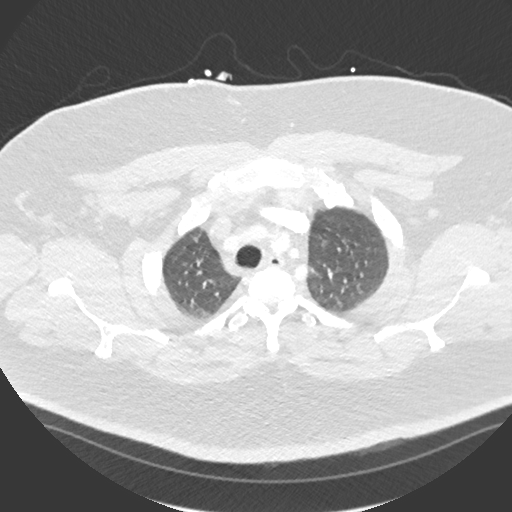
[im 238/263  soft-tissue]
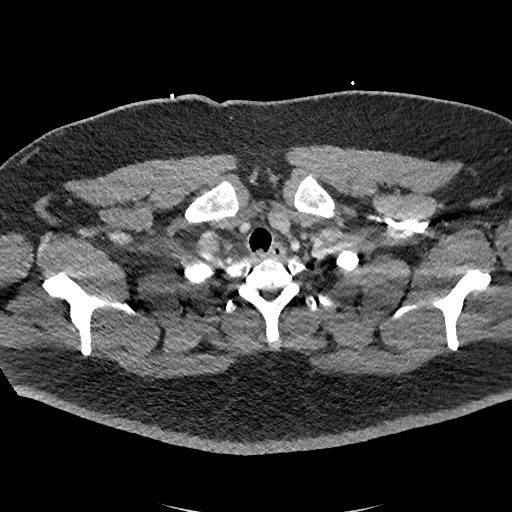
[im 250/263  lung]
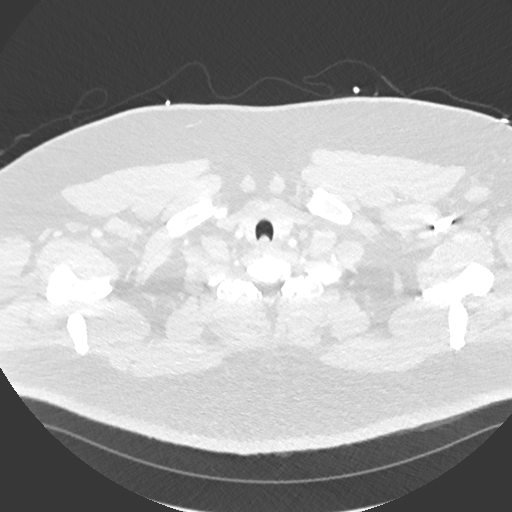

[Series 9: coronal mpr · coronal · 0.55mm/px · 3 of 125 slices shown]
[im 32/125  soft-tissue]
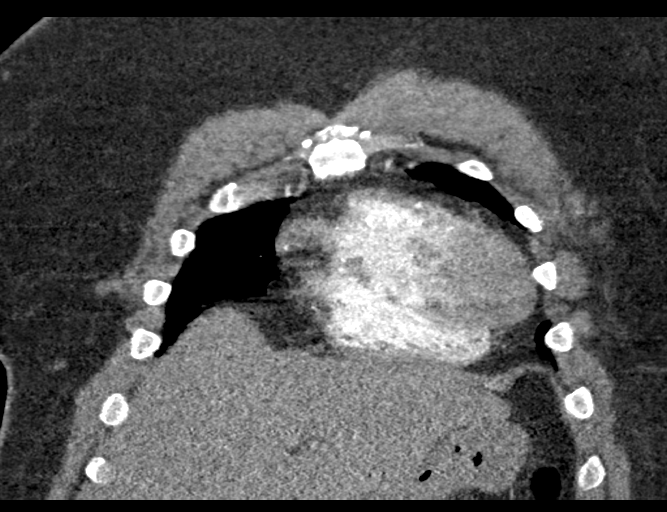
[im 63/125  soft-tissue]
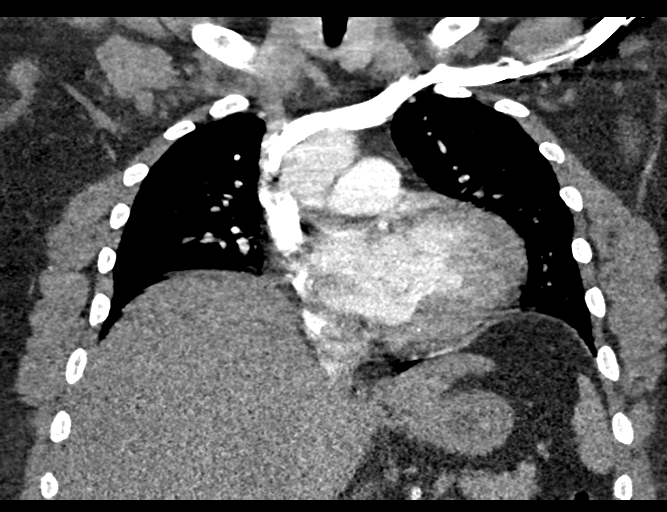
[im 94/125  soft-tissue]
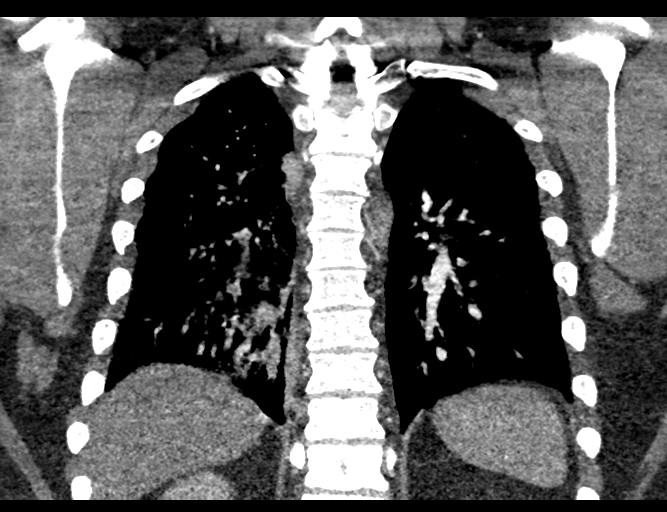

[18 of 46 positions shown; findings below may reference images not displayed]

FINDINGS: Cardiovascular: There is no demonstrable pulmonary embolus. There is
no thoracic aortic aneurysm or dissection. The visualized great
vessels appear unremarkable. There is no pericardial effusion or
pericardial thickening. The main pulmonary outflow tract measures
3.2 cm in diameter, prominent.

Mediastinum/Nodes: Visualized thyroid appears unremarkable. There is
no appreciable thoracic adenopathy. No esophageal lesions are
evident.

Lungs/Pleura: There is a small pleural effusion on the right. There
is airspace consolidation in portions of the medial and posterior
segments of the right lower lobe. Lungs elsewhere are clear.

Upper Abdomen: Visualized upper abdominal structures appear
unremarkable.

Musculoskeletal: There are no blastic or lytic bone lesions. There
are no chest wall lesions demonstrable.

Review of the MIP images confirms the above findings.
IMPRESSION: 1. No demonstrable pulmonary embolus. No thoracic aortic aneurysm or
dissection.

2. Airspace consolidation consistent with pneumonia involving
portions of the posterior and medial segments of the right lower
lobe. Small right pleural effusion.

3. Prominence of the main pulmonary outflow tract, a finding
indicative of pulmonary arterial hypertension.

4.  No appreciable thoracic adenopathy.

## 2018-06-27 IMAGING — DX CHEST - 2 VIEW
2 series · 2 of 2 positions shown · non-contrast
Comparison: [DATE]

CLINICAL DATA: Shortness of breath.  Chest pain.

EXAM:
CHEST - 2 VIEW

[chest lat]
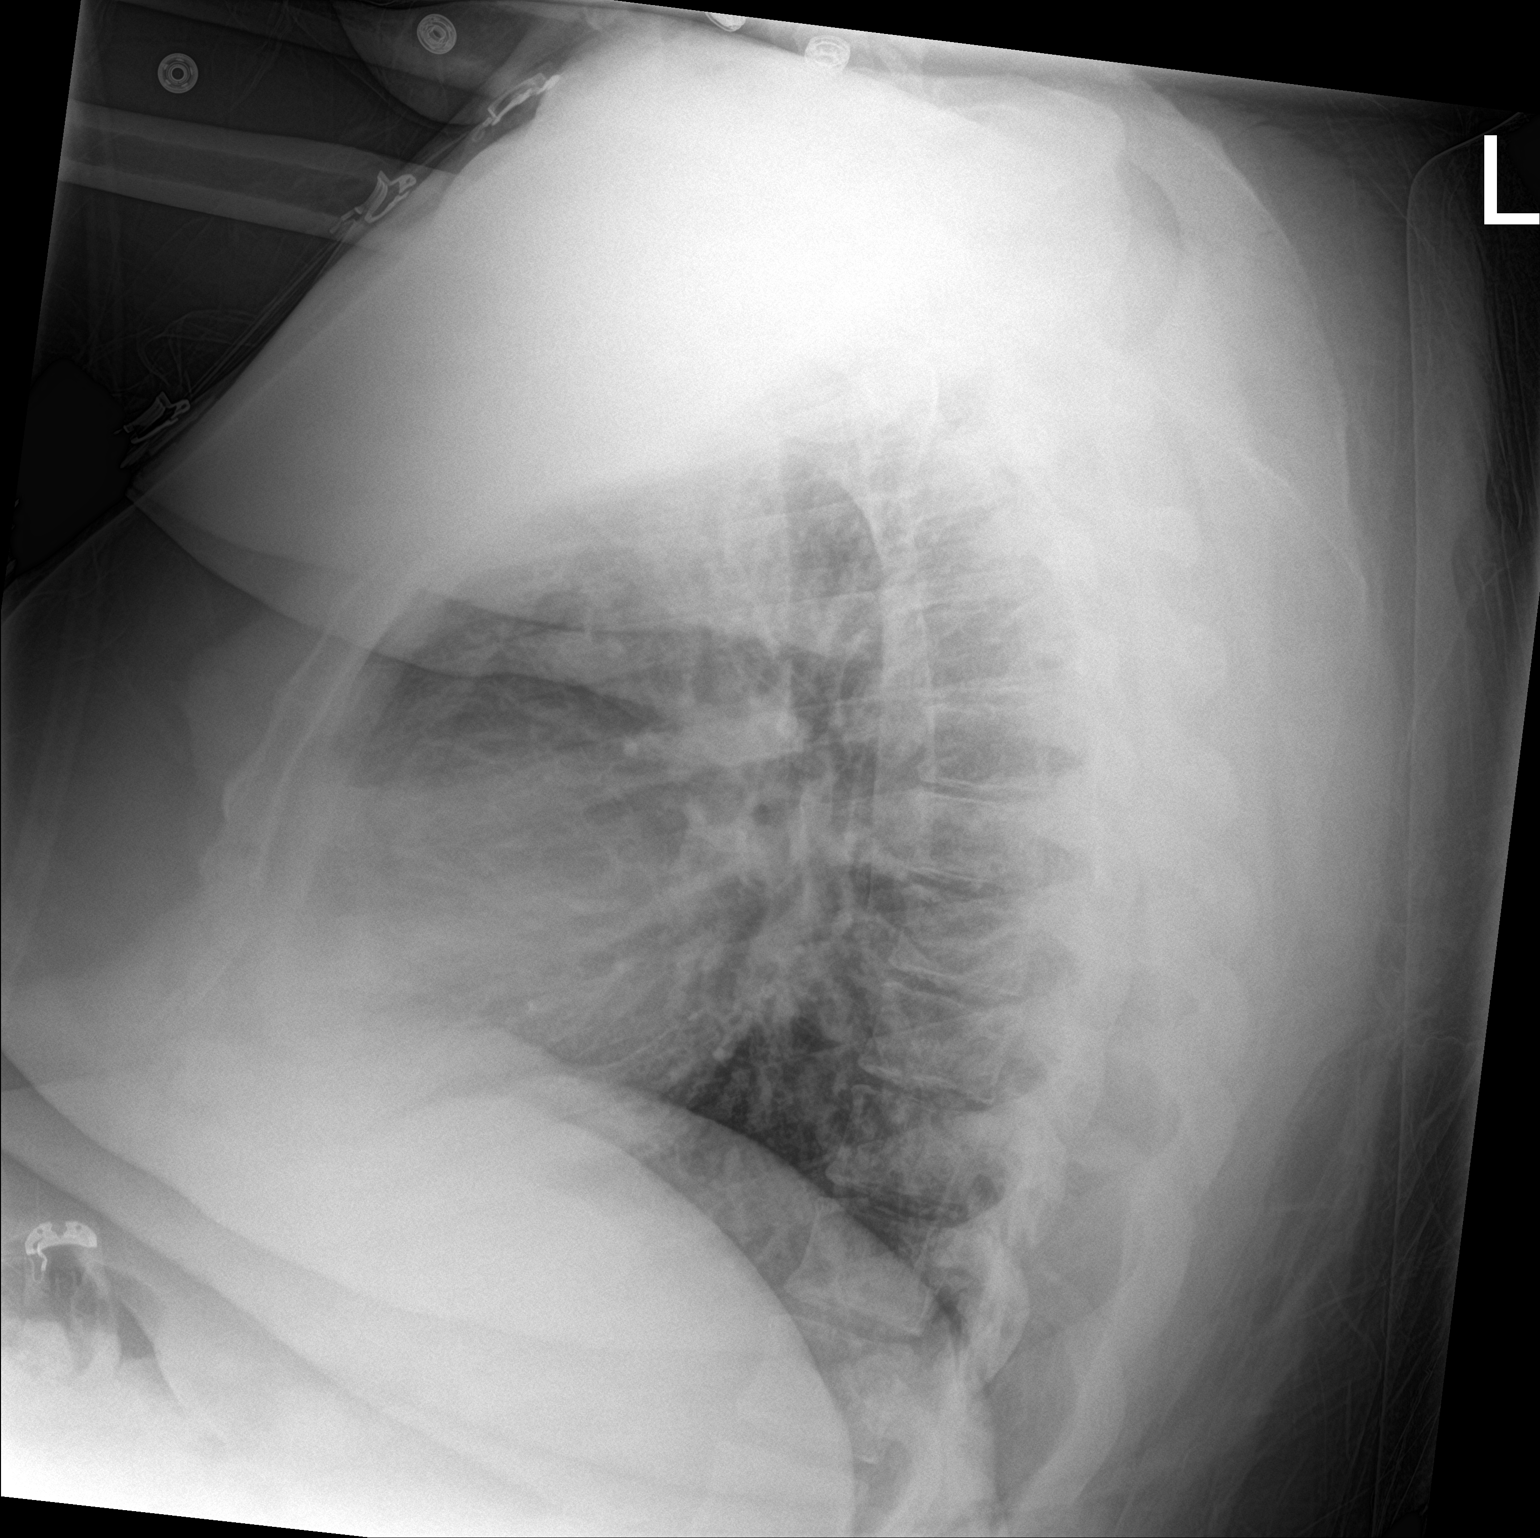

[chest ap]
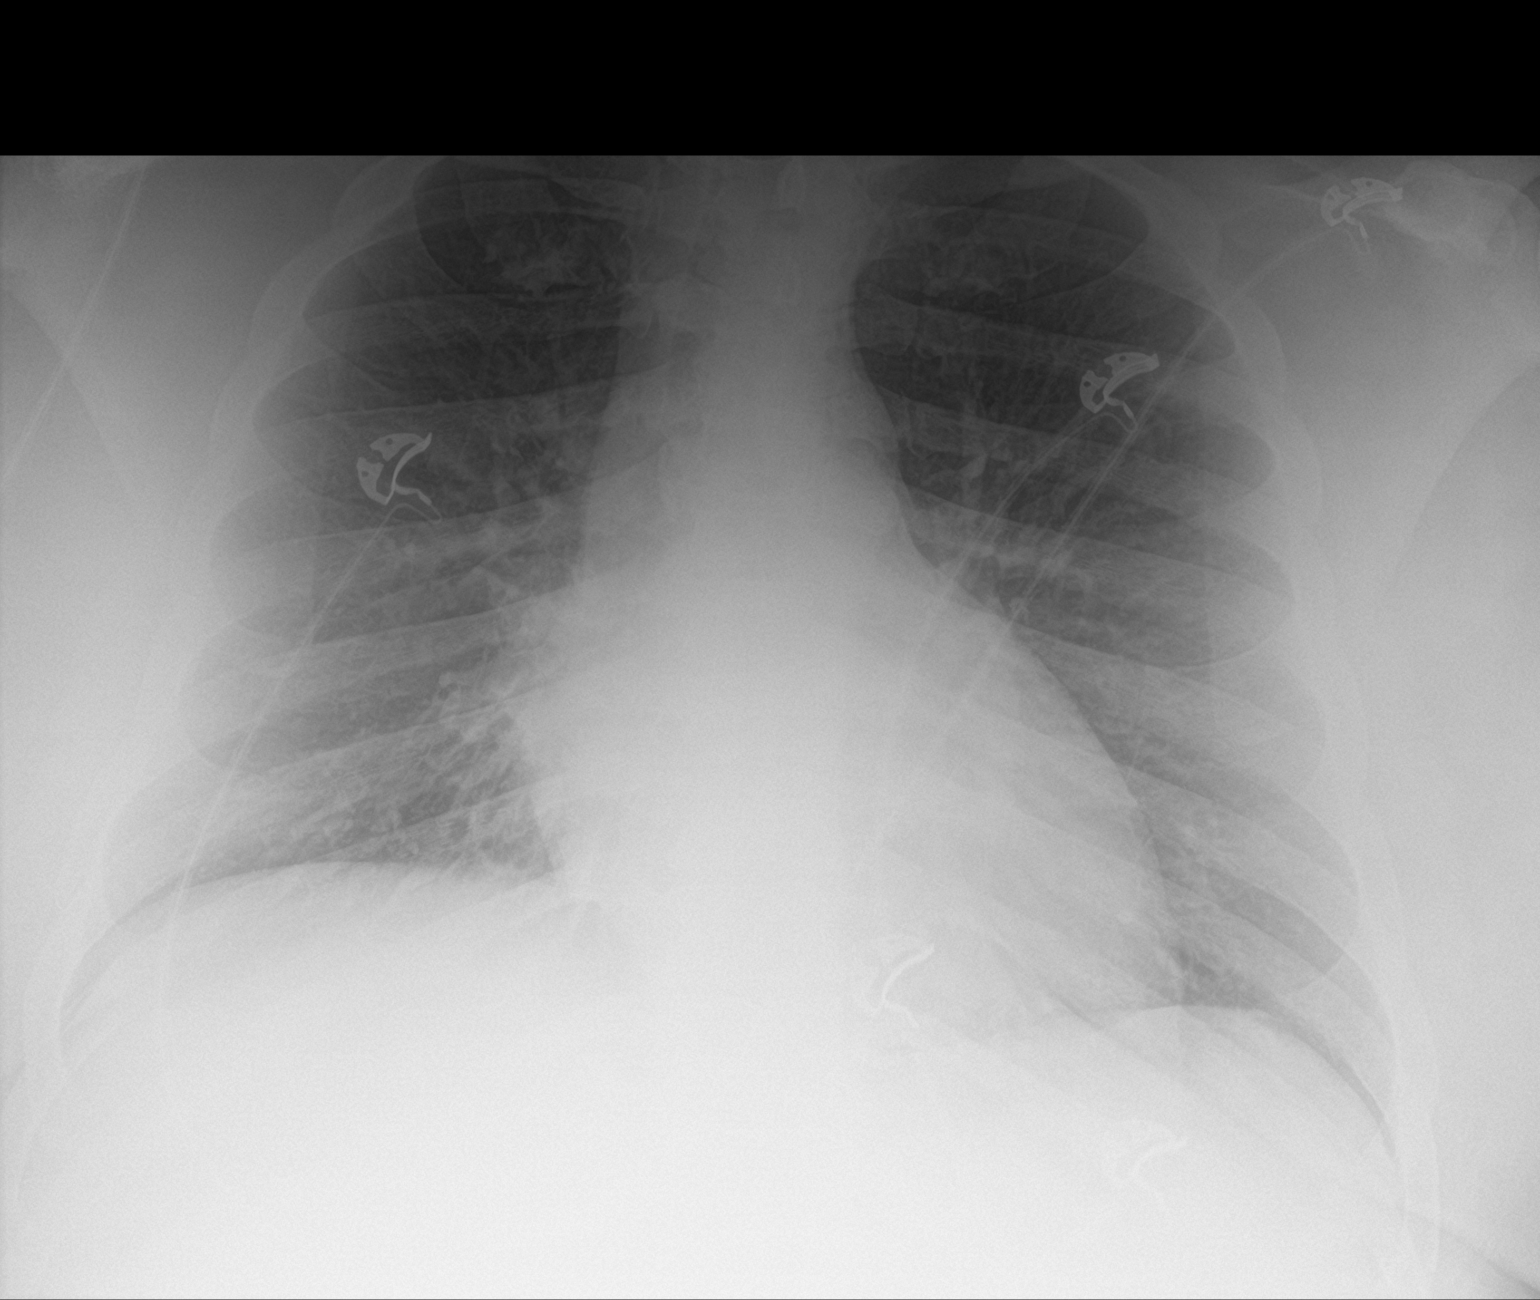

[2 of 2 positions shown; findings below may reference images not displayed]

FINDINGS: Lungs are clear. Mildly prominent lung markings at the right lung
base are chronic. Heart and mediastinum are within normal limits.
Trachea is midline. Negative for a pneumothorax. Bone structures are
intact. No large pleural effusions.
IMPRESSION: No active cardiopulmonary disease.

## 2018-06-27 IMAGING — US ULTRASOUND ABDOMEN LIMITED
1 series · 14 of 25 positions shown · non-contrast
Comparison: [DATE]

CLINICAL DATA: Right upper quadrant pain

EXAM:
ULTRASOUND ABDOMEN LIMITED RIGHT UPPER QUADRANT

[Series 1: ultrasound abdomen limited · 14 of 44 slices shown]
[im 1/44]
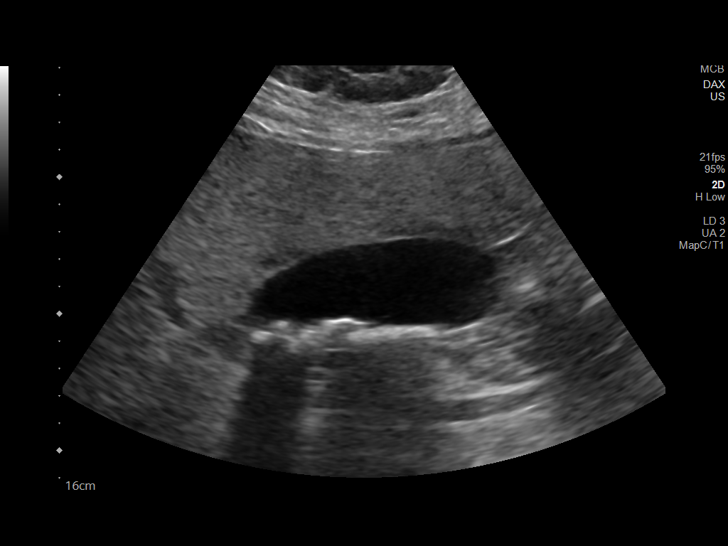
[im 4/44]
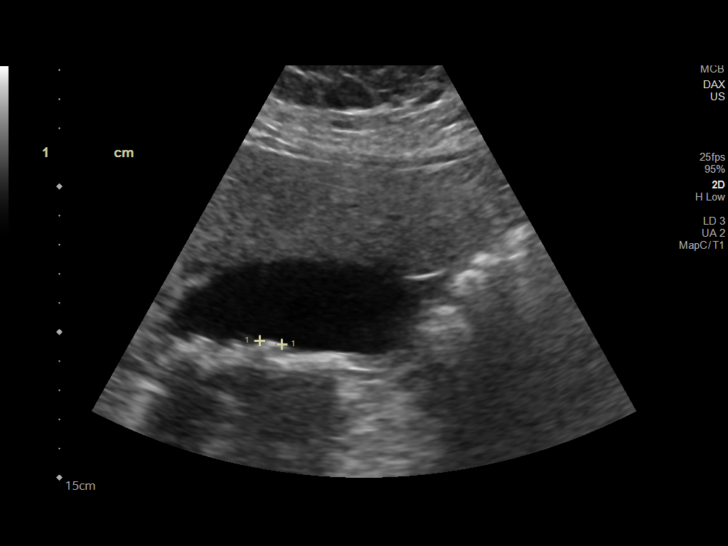
[im 8/44]
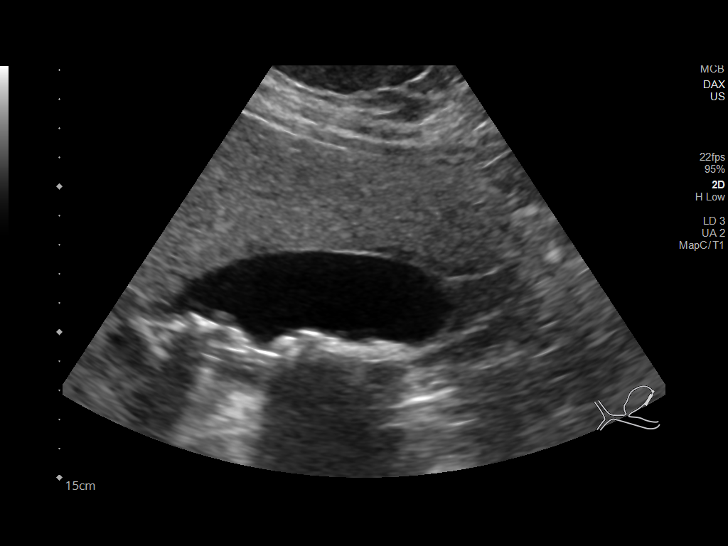
[im 11/44]
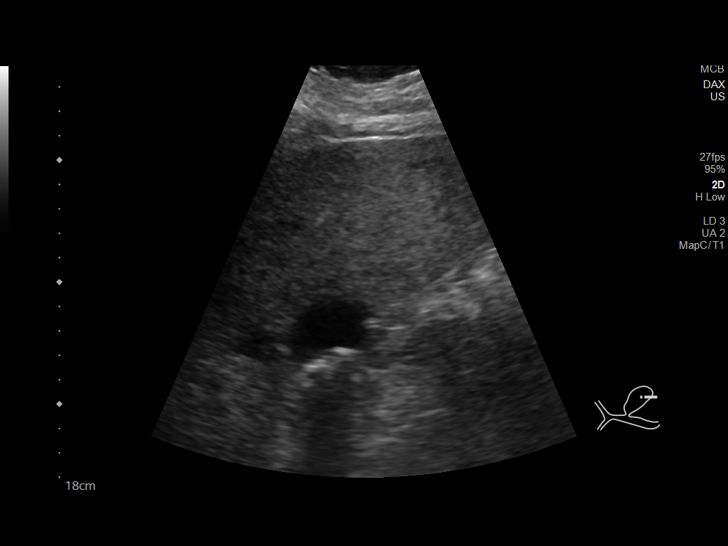
[im 15/44]
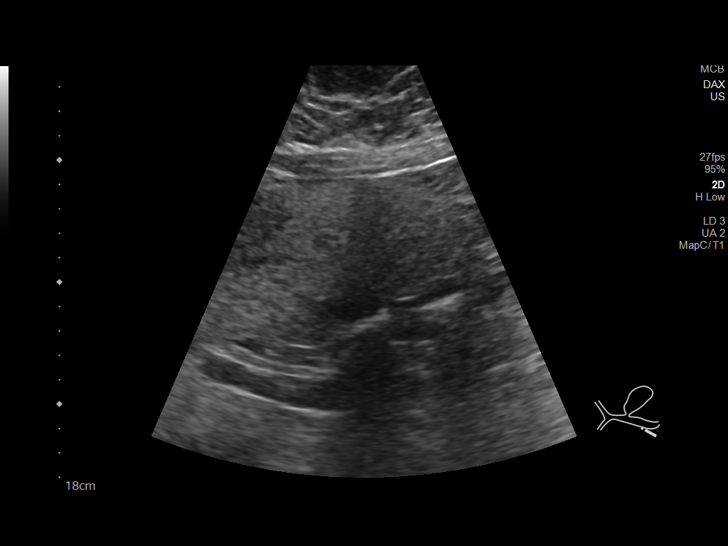
[im 17/44]
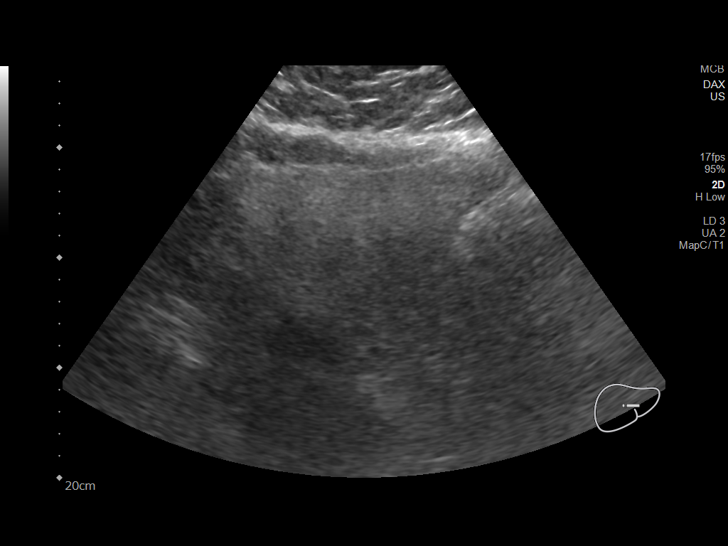
[im 20/44]
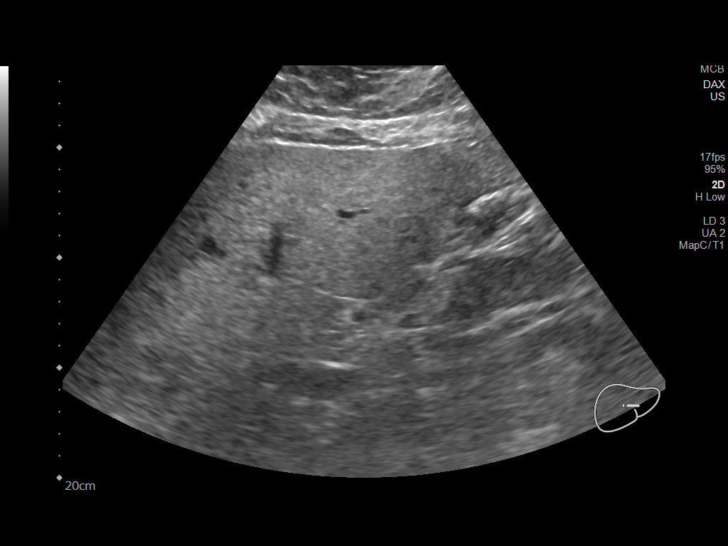
[im 24/44]
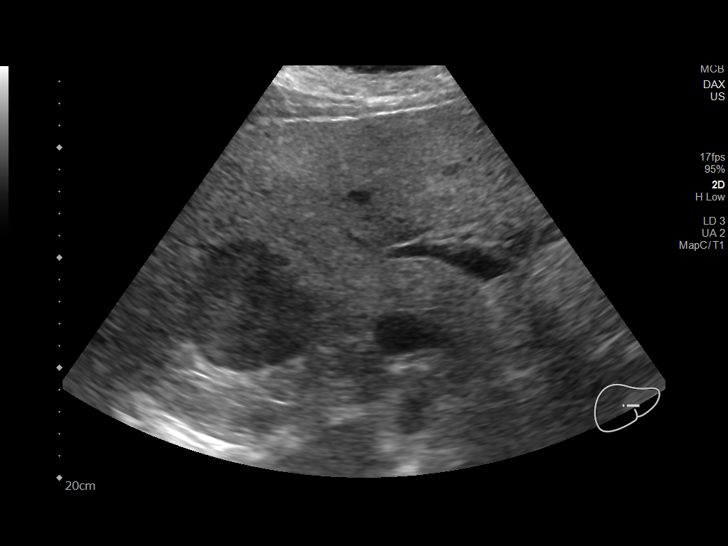
[im 27/44]
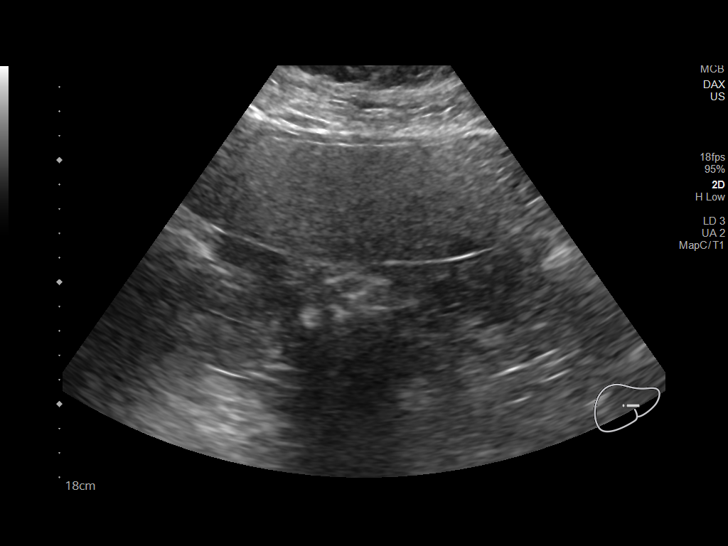
[im 29/44]
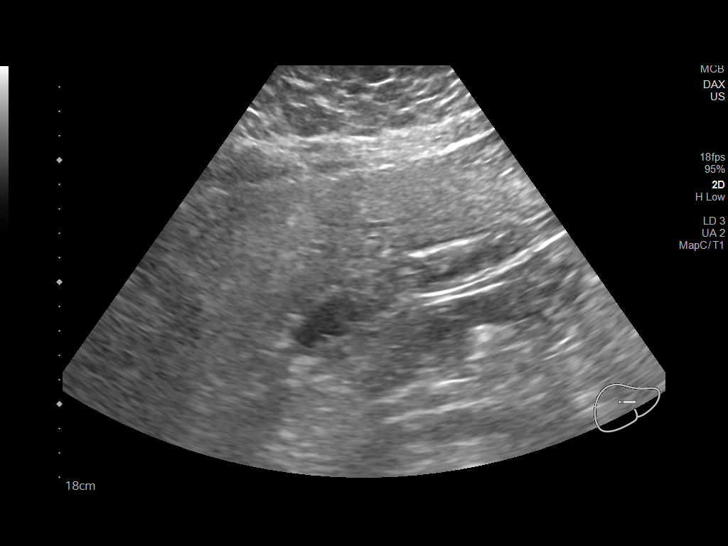
[im 33/44]
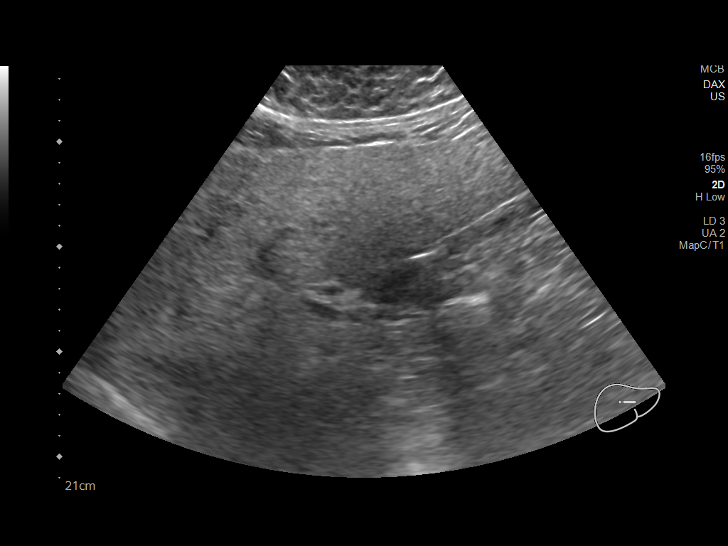
[im 36/44]
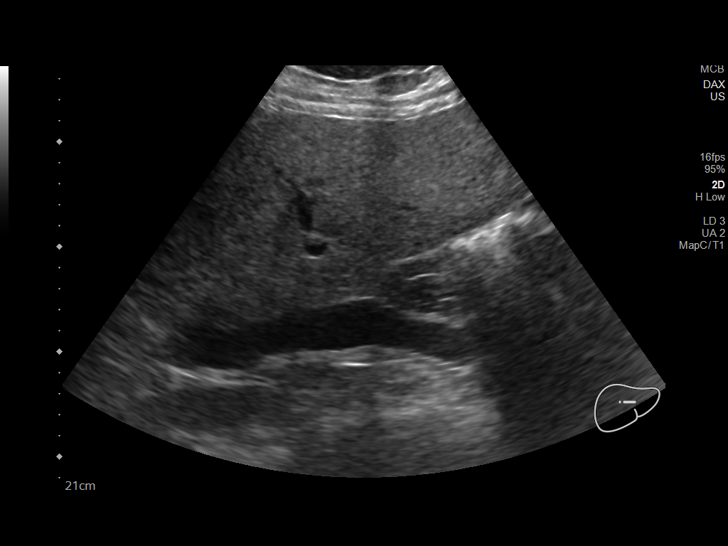
[im 40/44]
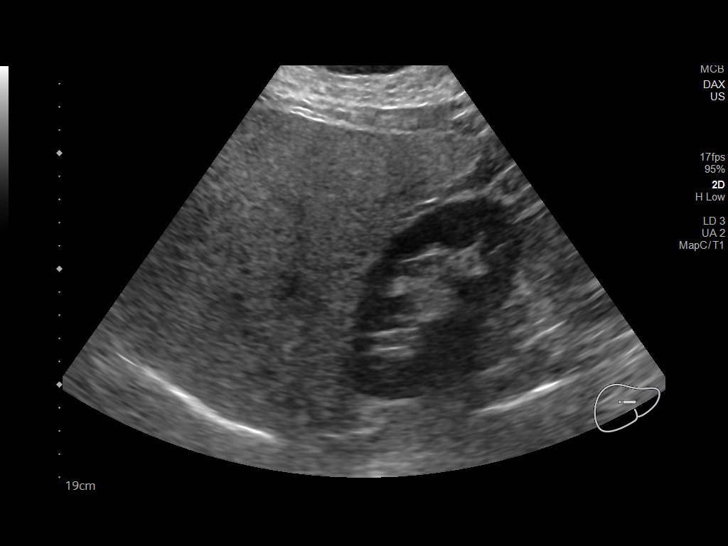
[im 44/44]
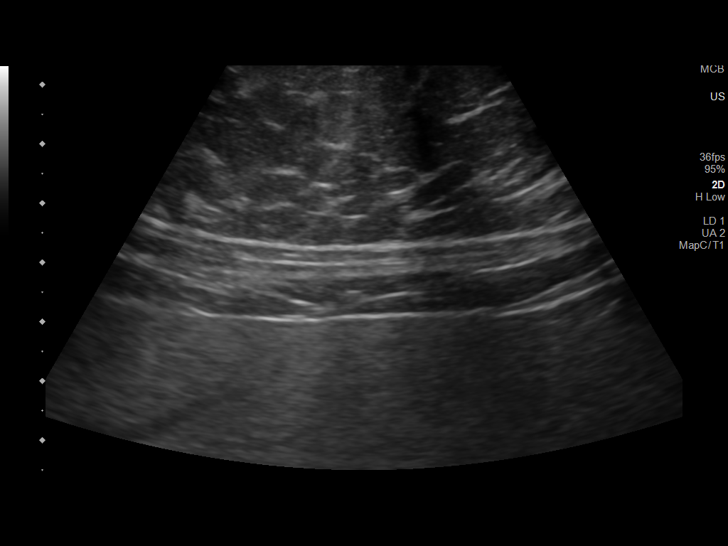

[14 of 25 positions shown; findings below may reference images not displayed]

FINDINGS: Gallbladder:

Within the gallbladder, there are multiple echogenic foci which move
and shadow consistent with cholelithiasis. Largest individual
gallstone measures 8 mm in length. There is no gallbladder wall
thickening or pericholecystic fluid. No sonographic Murphy sign
noted by sonographer.

Common bile duct:

Diameter: 3 mm. No intrahepatic or extrahepatic biliary duct
dilatation.

Liver:

No focal lesion identified. Within normal limits in parenchymal
echogenicity. Portal vein is patent on color Doppler imaging with
normal direction of blood flow towards the liver.
IMPRESSION: Cholelithiasis.  Study otherwise unremarkable.

## 2018-06-27 MED ORDER — AMOXICILLIN-POT CLAVULANATE 875-125 MG PO TABS: 1.0000 | ORAL_TABLET | Freq: Two times a day (BID) | ORAL | 0 refills | Status: DC

## 2018-06-27 MED ORDER — SODIUM CHLORIDE 0.9 % IV BOLUS
500.0000 mL | Freq: Once | INTRAVENOUS | Status: AC
  Administered 2018-06-27: 500 mL via INTRAVENOUS

## 2018-06-27 MED ORDER — IOHEXOL 350 MG/ML SOLN
100.0000 mL | Freq: Once | INTRAVENOUS | Status: AC | PRN
  Administered 2018-06-27: 100 mL via INTRAVENOUS

## 2018-06-27 MED ORDER — MORPHINE SULFATE (PF) 4 MG/ML IV SOLN
4.0000 mg | Freq: Once | INTRAVENOUS | Status: AC
  Administered 2018-06-27: 4 mg via INTRAVENOUS
  Filled 2018-06-27: qty 1

## 2018-06-27 MED ORDER — AZITHROMYCIN 250 MG PO TABS: 250.0000 mg | ORAL_TABLET | Freq: Every day | ORAL | 0 refills | Status: DC

## 2018-06-27 MED ORDER — KETOROLAC TROMETHAMINE 30 MG/ML IJ SOLN
30.0000 mg | Freq: Once | INTRAMUSCULAR | Status: AC
  Administered 2018-06-27: 30 mg via INTRAVENOUS
  Filled 2018-06-27: qty 1

## 2018-06-27 NOTE — ED Notes (Signed)
Walked up and down hall highest HR 123 when on way back to room and highest o2 93 when going back to room. No dizzy or SOB. Pt just said his foot hurt.

## 2018-06-27 NOTE — Discharge Instructions (Addendum)
Your work-up today showed that you have pneumonia of the right lower lobe of your lung.  Your COVID-19 test was negative.  Please take all of your antibiotics until finished!   You may develop abdominal discomfort or diarrhea from the antibiotic.  You may help offset this with probiotics which you can buy or get in yogurt. Do not eat  or take the probiotics until 2 hours after your antibiotic.   You may alternate 600 mg of ibuprofen and [phone] mg of Tylenol every 3 hours as needed for pain. Do not exceed 4000 mg of Tylenol daily.  Take ibuprofen with food to avoid upset stomach issues.  Drink plenty of water and get plenty of rest.  Follow-up with a primary care provider for reevaluation of your symptoms if they persist.  Return to the emergency department immediately if any concerning signs or symptoms develop such as worsening shortness of breath or chest pain, uncontrolled fevers despite the use of ibuprofen or Tylenol, passing out, confusion, or persistent vomiting.

## 2018-06-27 NOTE — Progress Notes (Signed)
VASCULAR LAB PRELIMINARY  PRELIMINARY  PRELIMINARY  PRELIMINARY  Left lower extremity venous duplex completed.    Preliminary report:  See CV proc for results.   Gave report to [facility], PA-C  Tiauna Whisnant, RVT 06/27/2018, 11:57 AM

## 2018-06-27 NOTE — ED Notes (Signed)
Patient given discharge instructions and verbalized understanding.  Patient stable to discharge at this time.  Patient is alert and oriented to baseline.  No distressed noted at this time.  All belongings taken with the patient at discharge.   

## 2018-06-27 NOTE — ED Provider Notes (Signed)
[facility] Provider Note   CSN: [id number] Arrival date & time: 06/27/18  8938    History   Chief Complaint Chief Complaint  Patient presents with   Arm Pain   Chest Pain    HPI Barry [name] is a 43 y.o. male with history of insulin-dependent type 2 diabetes mellitus, hyperlipidemia, hypothyroidism, diabetic neuropathy, carpal tunnel syndrome, and obesity presents for evaluation of multiple complaints.  He reports that 3 days ago he developed pain to the distal left forearm radiating to the first 3 digits.  He reports that this feels consistent with his usual carpal tunnel pain but more persistent and more severe.  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  He reports that he is in the process of seeing [facility] for this and his neurologist has sent over results to them.  Yesterday at around 6 AM developed intermittent burning right-sided chest and upper abdominal pain radiating to the flank.  It is intermittent, lasting for around half an hour at a time before resolving.  It is associated with shortness of breath which he reports is somewhat chronic but worse since yesterday.  Also reports left calf pain.  Denies recent travel or surgeries, hemoptysis, prior history of DVT or PE.  He was previously on testosterone replacement therapy but reports that this was discontinued "a while ago".  Denies nausea or vomiting, or diarrhea.  Notes he has a history of cholelithiasis but "they were not able to remove my gallbladder because my liver was too big ".  No aggravating or alleviating factors noted.  He is a non-smoker, denies recreational drug use or excessive alcohol intake. Reports he had a stress test and echo "years ago" but does not remember the results.    The history is provided by the patient.    Past Medical History:  Diagnosis Date   Carpal tunnel syndrome    Diabetes mellitus  without complication ([place])    Hypercholesteremia    Neuropathy    Obesity    Thyroid disease     Patient Active Problem List   Diagnosis Date Noted   HYPOTHYROIDISM 05/31/2008   DYSLIPIDEMIA 05/31/2008   OBESITY, MORBID 05/31/2008   MYOCARDIAL PERFUSION SCAN, WITH STRESS TEST, ABNORMAL 05/31/2008    Past Surgical History:  Procedure Laterality Date   FRACTURE SURGERY     INCISE AND DRAIN ABCESS     TONSILLECTOMY          Home Medications    Prior to Admission medications   Medication Sig Start Date End Date Taking? Authorizing Provider  dapagliflozin propanediol (FARXIGA) 10 MG TABS tablet Take 10 mg by mouth daily. 11/20/17  Yes [provider]  metFORMIN (GLUCOPHAGE) 500 MG tablet Take 1,000 mg by mouth 2 (two) times daily with a meal. 05/14/18  Yes [provider]  amoxicillin-clavulanate (AUGMENTIN) 875-125 MG tablet Take 1 tablet by mouth every 12 (twelve) hours for 5 days. 06/27/18 07/02/18  Rodell Perna A, PA-C  atorvastatin (LIPITOR) 80 MG tablet Take 80 mg by mouth every morning. 11/21/17   [provider]  azithromycin (ZITHROMAX) 250 MG tablet Take 1 tablet (250 mg total) by mouth daily. Take first 2 tablets together, then 1 every day until finished. 06/27/18   Carsen Leaf A, PA-C  hydrochlorothiazide (HYDRODIURIL) 25 MG tablet Take 25 mg by mouth daily. 11/14/16   [provider]  HYDROcodone-acetaminophen (NORCO/VICODIN) 5-325 MG tablet Take  1 tablet by mouth every 6 (six) hours as needed for moderate pain. 11/15/16   Kirichenko, Lemont Fillersatyana, PA-C  levothyroxine (SYNTHROID, LEVOTHROID) 75 MCG tablet Take 75 mcg by mouth daily before breakfast.    [provider]  lisinopril (PRINIVIL,ZESTRIL) 10 MG tablet Take 10 mg by mouth daily.    [provider]  naproxen sodium (ANAPROX) 220 MG tablet Take 440 mg by mouth 2 (two) times daily with a meal.    [provider]  vitamin B-12 (CYANOCOBALAMIN) 1000 MCG  tablet Take 1,000 mcg by mouth daily.    [provider]    Family History No family history on file.  Social History Social History   Tobacco Use   Smoking status: Former Smoker   Smokeless tobacco: Never Used  Substance Use Topics   Alcohol use: Yes   Drug use: No     Allergies   Patient has no known allergies.   Review of Systems Review of Systems  Constitutional: Negative for chills and fever.  Respiratory: Positive for shortness of breath. Negative for cough.   Cardiovascular: Positive for chest pain and leg swelling.  Gastrointestinal: Positive for abdominal pain. Negative for constipation, diarrhea, nausea and vomiting.  Neurological: Negative for syncope and light-headedness.  All other systems reviewed and are negative.    Physical Exam Updated Vital Signs BP 107/81    Pulse (!) 101    Temp (S) 98.1 F (36.7 C) (Oral)    Resp (!) 23    Ht 6\' 3"  (1.905 m)    Wt (!) 156.5 kg    SpO2 96%    BMI 43.12 kg/m   Physical Exam Vitals signs and nursing note reviewed.  Constitutional:      General: He is not in acute distress.    Appearance: He is well-developed. He is obese.     Comments: Morbidly obese, resting in bed  HENT:     Head: Normocephalic and atraumatic.  Eyes:     General:        Right eye: No discharge.        Left eye: No discharge.     Conjunctiva/sclera: Conjunctivae normal.  Neck:     Vascular: No JVD.     Trachea: No tracheal deviation.  Cardiovascular:     Rate and Rhythm: Regular rhythm. Tachycardia present.     Pulses:          Radial pulses are 2+ on the right side and 2+ on the left side.       Dorsalis pedis pulses are 2+ on the right side and 2+ on the left side.       Posterior tibial pulses are 2+ on the right side and 2+ on the left side.     Comments: Bilateral lower extremity edema, patient reports this is been chronic for the last 7 months or so.  Homans sign present on the left.  Chest:     Chest wall: No  tenderness.  Abdominal:     General: Bowel sounds are decreased. There is no distension.     Palpations: Abdomen is soft.     Tenderness: There is abdominal tenderness in the right upper quadrant. There is no guarding or rebound. Negative signs include Murphy's sign.  Musculoskeletal:     Left lower leg: He exhibits tenderness.     Comments: 5/5 strength of BLE major muscle groups.  Good grip strength bilaterally.  Positive Tinel's and Phalen sign on the left.  Skin:  General: Skin is warm and dry.     Findings: No erythema.  Neurological:     Mental Status: He is alert.  Psychiatric:        Behavior: Behavior normal.      ED Treatments / Results  Labs (all labs ordered are listed, but only abnormal results are displayed) Labs Reviewed  COMPREHENSIVE METABOLIC PANEL - Abnormal; Notable for the following components:      Result Value   Glucose, Bld 140 (*)    Creatinine, Ser 1.48 (*)    Calcium 8.7 (*)    Albumin 3.1 (*)    Total Bilirubin 1.3 (*)    GFR calc non Af Amer 58 (*)    All other components within normal limits  D-DIMER, QUANTITATIVE (NOT AT [facility]) - Abnormal; Notable for the following components:   D-Dimer, Quant 1.76 (*)    All other components within normal limits  CBC WITH DIFFERENTIAL/PLATELET - Abnormal; Notable for the following components:   WBC 10.7 (*)    Hemoglobin 12.9 (*)    HCT 38.6 (*)    Monocytes Absolute 1.5 (*)    All other components within normal limits  SARS CORONAVIRUS 2 (HOSPITAL ORDER, PERFORMED IN [facility] LAB)  TROPONIN I  LIPASE, BLOOD    EKG EKG Interpretation  Date/Time:  Sunday June 27 2018 08:44:43 EDT Ventricular Rate:  116 PR Interval:    QRS Duration: 97 QT Interval:  330 QTC Calculation: 459 R Axis:   0 Text Interpretation:  Sinus tachycardia Abnormal R-wave progression, late transition Baseline wander in lead(s) V6 No significant change since last tracing Confirmed by Linwood Dibbles [phone]) on 06/27/2018  8:47:29 AM   Radiology Dg Chest 2 View  Result Date: 06/27/2018 CLINICAL DATA:  Shortness of breath.  Chest pain. EXAM: CHEST - 2 VIEW COMPARISON:  10/04/2017 FINDINGS: Lungs are clear. Mildly prominent lung markings at the right lung base are chronic. Heart and mediastinum are within normal limits. Trachea is midline. Negative for a pneumothorax. Bone structures are intact. No large pleural effusions. IMPRESSION: No active cardiopulmonary disease. Electronically Signed   By: Richarda Overlie M.D.   On: 06/27/2018 09:36   Ct Angio Chest Pe W And/or Wo Contrast  Result Date: 06/27/2018 CLINICAL DATA:  Chest pain and shortness of breath EXAM: CT ANGIOGRAPHY CHEST WITH CONTRAST TECHNIQUE: Multidetector CT imaging of the chest was performed using the standard protocol during bolus administration of intravenous contrast. Multiplanar CT image reconstructions and MIPs were obtained to evaluate the vascular anatomy. CONTRAST:  OMNIPAQUE IOHEXOL 350 MG/ML SOLN COMPARISON:  March 19, 2017 CT angiogram chest and chest radiograph June 27, 2018 FINDINGS: Cardiovascular: There is no demonstrable pulmonary embolus. There is no thoracic aortic aneurysm or dissection. The visualized great vessels appear unremarkable. There is no pericardial effusion or pericardial thickening. The main pulmonary outflow tract measures 3.2 cm in diameter, prominent. Mediastinum/Nodes: Visualized thyroid appears unremarkable. There is no appreciable thoracic adenopathy. No esophageal lesions are evident. Lungs/Pleura: There is a small pleural effusion on the right. There is airspace consolidation in portions of the medial and posterior segments of the right lower lobe. Lungs elsewhere are clear. Upper Abdomen: Visualized upper abdominal structures appear unremarkable. Musculoskeletal: There are no blastic or lytic bone lesions. There are no chest wall lesions demonstrable. Review of the MIP images confirms the above findings. IMPRESSION: 1.  No demonstrable pulmonary embolus. No thoracic aortic aneurysm or dissection. 2. Airspace consolidation consistent with pneumonia involving portions of the  posterior and medial segments of the right lower lobe. Small right pleural effusion. 3. Prominence of the main pulmonary outflow tract, a finding indicative of pulmonary arterial hypertension. 4.  No appreciable thoracic adenopathy. Electronically Signed   By: Bretta Bang III M.D.   On: 06/27/2018 13:05   Vas [place] Lower Extremity Venous (dvt) (mc And Wl 7a-7p)  Result Date: 06/27/2018  Lower Venous Study Indications: Edema.  Comparison Study: No prior study on file for comparison. Performing Technologist: Sherren Kerns RVS  Examination Guidelines: A complete evaluation includes B-mode imaging, spectral Doppler, color Doppler, and power Doppler as needed of all accessible portions of each vessel. Bilateral testing is considered an integral part of a complete examination. Limited examinations for reoccurring indications may be performed as noted.  +-----+---------------+---------+-----------+----------+-------+  RIGHT Compressibility Phasicity Spontaneity Properties Summary  +-----+---------------+---------+-----------+----------+-------+  CFV   Full            Yes       Yes                             +-----+---------------+---------+-----------+----------+-------+   +---------+---------------+---------+-----------+----------+-------+  LEFT      Compressibility Phasicity Spontaneity Properties Summary  +---------+---------------+---------+-----------+----------+-------+  CFV       Full            Yes       Yes                             +---------+---------------+---------+-----------+----------+-------+  SFJ       Full                                                      +---------+---------------+---------+-----------+----------+-------+  FV Prox   Full                                                       +---------+---------------+---------+-----------+----------+-------+  FV Mid    Full                                                      +---------+---------------+---------+-----------+----------+-------+  FV Distal Full                                                      +---------+---------------+---------+-----------+----------+-------+  PFV       Full                                                      +---------+---------------+---------+-----------+----------+-------+  POP       Full            Yes  Yes                             +---------+---------------+---------+-----------+----------+-------+  PTV       Full                                                      +---------+---------------+---------+-----------+----------+-------+  PERO      Full                                                      +---------+---------------+---------+-----------+----------+-------+     Summary: Right: No evidence of common femoral vein obstruction. Left: There is no evidence of deep vein thrombosis in the lower extremity.  *See table(s) above for measurements and observations.    Preliminary    [place] Abdomen Limited Ruq  Result Date: 06/27/2018 CLINICAL DATA:  Right upper quadrant pain EXAM: ULTRASOUND ABDOMEN LIMITED RIGHT UPPER QUADRANT COMPARISON:  April 08, 2016 FINDINGS: Gallbladder: Within the gallbladder, there are multiple echogenic foci which move and shadow consistent with cholelithiasis. Largest individual gallstone measures 8 mm in length. There is no gallbladder wall thickening or pericholecystic fluid. No sonographic Murphy sign noted by sonographer. Common bile duct: Diameter: 3 mm. No intrahepatic or extrahepatic biliary duct dilatation. Liver: No focal lesion identified. Within normal limits in parenchymal echogenicity. Portal vein is patent on color Doppler imaging with normal direction of blood flow towards the liver. IMPRESSION: Cholelithiasis.  Study otherwise unremarkable. Electronically  Signed   By: Bretta BangWilliam  Woodruff III M.D.   On: 06/27/2018 09:45    Procedures Procedures (including critical care time)  Medications Ordered in ED Medications  morphine 4 MG/ML injection 4 mg (4 mg Intravenous Given 06/27/18 1058)  sodium chloride 0.9 % bolus 500 mL (0 mLs Intravenous Stopped 06/27/18 1300)  iohexol (OMNIPAQUE) 350 MG/ML injection 100 mL (100 mLs Intravenous Contrast Given 06/27/18 1228)  sodium chloride 0.9 % bolus 500 mL (500 mLs Intravenous New Bag/Given 06/27/18 1409)  ketorolac (TORADOL) 30 MG/ML injection 30 mg (30 mg Intravenous Given 06/27/18 1432)     Initial Impression / Assessment and Plan / ED Course  I have reviewed the triage vital signs and the nursing notes.  Pertinent labs & imaging results that were available during my care of the patient were reviewed by me and considered in my medical decision making (see chart for details).     Patient presenting for evaluation of right-sided chest pain/right upper quadrant abdominal pain since yesterday.  Also has a flare of his known left carpal tunnel syndrome for which he will be following up with orthopedics on an outpatient basis.  He is neurovascularly intact.  Discussed conservative therapy and management until follow-up with orthopedics for this.  Lab work today significant for mild nonspecific leukocytosis, mild anemia, mildly elevated creatinine but BUN within normal limits.  No metabolic derangements.  Given his history of cholelithiasis and right upper quadrant tenderness a right upper quadrant ultrasound was obtained which showed no evidence of acute cholecystitis or other acute hepatobiliary abnormalities.  His d-dimer was elevated and so a CTA of the chest was obtained to rule out PE which  showed evidence of a right lower lobe pneumonia and small pleural effusion but no evidence of PE or other acute abnormality.  Negative for DVT in the left lower extremity. His EKG shows sinus tachycardia, no significant changes  from last tracing however.  On chart review it appears that it is not unusual for him to be tachycardic.   He is COVID-19 negative.  He was ambulated in the ED with stable SPO2 saturations and no complaints of shortness of breath.  His curb 65 score is 0 and his PSI/port score is 60, both of which indicate that it is reasonable to treat his pneumonia on an outpatient basis.  Serial abdominal examinations remain benign and he is tolerating p.o. food and fluids without difficulty in the ED.  Will discharge with course of Augmentin and azithromycin.  Discussed follow-up with PCP in the next 2 to 4 days and strict ED return precautions.  Patient verbalized understanding of and agreement with plan and patient stable for discharge home at this time.  Final Clinical Impressions(s) / ED Diagnoses   Final diagnoses:  RUQ pain  History of gallstones  Community acquired pneumonia of right lower lobe of lung (HCC)  Right-sided chest pain  Carpal tunnel syndrome on left    ED Discharge Orders         Ordered    amoxicillin-clavulanate (AUGMENTIN) 875-125 MG tablet  Every 12 hours,   Status:  Discontinued     06/27/18 1440    azithromycin (ZITHROMAX) 250 MG tablet  Daily,   Status:  Discontinued     06/27/18 1440    amoxicillin-clavulanate (AUGMENTIN) 875-125 MG tablet  Every 12 hours     06/27/18 1459    azithromycin (ZITHROMAX) 250 MG tablet  Daily     06/27/18 1459           Jeanie SewerFawze, Chloe Baig A, PA-C 06/27/18 1500    Linwood DibblesKnapp, Jon, MD 06/29/18 1529

## 2018-06-27 NOTE — ED Triage Notes (Signed)
Patient c/o left wrist and elbow pain onset 3 days ago ,states he has a history of carpel tunnel and gout, c/o right sided chest pain onset yest. States he has a history of gallbladder disease however wasn't able to have it removed because of liver enlargement. C/o slight sob, denies cough. C/o numbness in his hand at times.

## 2018-06-30 ENCOUNTER — Encounter (HOSPITAL_COMMUNITY): Payer: Self-pay | Admitting: Emergency Medicine

## 2018-06-30 ENCOUNTER — Emergency Department (HOSPITAL_COMMUNITY)
Admission: EM | Admit: 2018-06-30 | Discharge: 2018-07-01 | Disposition: A | Discharge status: Home / Self Care | Payer: BC Managed Care – PPO | Attending: Emergency Medicine | Admitting: Emergency Medicine

## 2018-06-30 ENCOUNTER — Other Ambulatory Visit: Payer: Self-pay

## 2018-06-30 DIAGNOSIS — R5381 Other malaise: Secondary | ICD-10-CM | POA: Diagnosis not present

## 2018-06-30 DIAGNOSIS — E039 Hypothyroidism, unspecified: Secondary | ICD-10-CM | POA: Diagnosis not present

## 2018-06-30 DIAGNOSIS — Z7984 Long term (current) use of oral hypoglycemic drugs: Secondary | ICD-10-CM | POA: Insufficient documentation

## 2018-06-30 DIAGNOSIS — Y999 Unspecified external cause status: Secondary | ICD-10-CM | POA: Diagnosis not present

## 2018-06-30 DIAGNOSIS — E1140 Type 2 diabetes mellitus with diabetic neuropathy, unspecified: Secondary | ICD-10-CM | POA: Diagnosis not present

## 2018-06-30 DIAGNOSIS — S99922A Unspecified injury of left foot, initial encounter: Secondary | ICD-10-CM | POA: Diagnosis not present

## 2018-06-30 DIAGNOSIS — Z87891 Personal history of nicotine dependence: Secondary | ICD-10-CM | POA: Insufficient documentation

## 2018-06-30 DIAGNOSIS — Y929 Unspecified place or not applicable: Secondary | ICD-10-CM | POA: Diagnosis not present

## 2018-06-30 DIAGNOSIS — X58XXXA Exposure to other specified factors, initial encounter: Secondary | ICD-10-CM | POA: Insufficient documentation

## 2018-06-30 DIAGNOSIS — Y939 Activity, unspecified: Secondary | ICD-10-CM | POA: Insufficient documentation

## 2018-06-30 DIAGNOSIS — S92415A Nondisplaced fracture of proximal phalanx of left great toe, initial encounter for closed fracture: Secondary | ICD-10-CM | POA: Diagnosis not present

## 2018-06-30 DIAGNOSIS — M19072 Primary osteoarthritis, left ankle and foot: Secondary | ICD-10-CM | POA: Diagnosis not present

## 2018-06-30 DIAGNOSIS — Z79899 Other long term (current) drug therapy: Secondary | ICD-10-CM | POA: Insufficient documentation

## 2018-06-30 DIAGNOSIS — Z03818 Encounter for observation for suspected exposure to other biological agents ruled out: Secondary | ICD-10-CM | POA: Diagnosis not present

## 2018-06-30 DIAGNOSIS — R000 Tachycardia, unspecified: Secondary | ICD-10-CM

## 2018-06-30 DIAGNOSIS — S92412A Displaced fracture of proximal phalanx of left great toe, initial encounter for closed fracture: Secondary | ICD-10-CM | POA: Diagnosis not present

## 2018-06-30 DIAGNOSIS — R52 Pain, unspecified: Secondary | ICD-10-CM

## 2018-06-30 NOTE — ED Triage Notes (Signed)
Pt reports left sided body pain that has been going on since Sunday. Pt reports being here on Sunday and being diagnosed with pneumonia. Pt reports not feeling any better since coming in.

## 2018-07-01 ENCOUNTER — Emergency Department (HOSPITAL_COMMUNITY): Payer: BC Managed Care – PPO

## 2018-07-01 DIAGNOSIS — M19072 Primary osteoarthritis, left ankle and foot: Secondary | ICD-10-CM | POA: Diagnosis not present

## 2018-07-01 DIAGNOSIS — S92415A Nondisplaced fracture of proximal phalanx of left great toe, initial encounter for closed fracture: Secondary | ICD-10-CM | POA: Diagnosis not present

## 2018-07-01 LAB — COMPREHENSIVE METABOLIC PANEL WITH GFR
ALT: 23 U/L (ref 0–44)
AST: 14 U/L — ABNORMAL LOW (ref 15–41)
Albumin: 3.1 g/dL — ABNORMAL LOW (ref 3.5–5.0)
Alkaline Phosphatase: 59 U/L (ref 38–126)
Anion gap: 14 (ref 5–15)
BUN: 20 mg/dL (ref 6–20)
CO2: 23 mmol/L (ref 22–32)
Calcium: 9.3 mg/dL (ref 8.9–10.3)
Chloride: 98 mmol/L (ref 98–111)
Creatinine, Ser: 1.62 mg/dL — ABNORMAL HIGH (ref 0.61–1.24)
GFR calc Af Amer: >60 mL/min
GFR calc non Af Amer: 52 mL/min — ABNORMAL LOW
Glucose, Bld: 158 mg/dL — ABNORMAL HIGH (ref 70–99)
Potassium: 4.5 mmol/L (ref 3.5–5.1)
Sodium: 135 mmol/L (ref 135–145)
Total Bilirubin: 1.1 mg/dL (ref 0.3–1.2)
Total Protein: 8.1 g/dL (ref 6.5–8.1)

## 2018-07-01 LAB — CBC
HCT: 41.6 % (ref 39.0–52.0)
Hemoglobin: 13.7 g/dL (ref 13.0–17.0)
MCH: 30.4 pg (ref 26.0–34.0)
MCHC: 32.9 g/dL (ref 30.0–36.0)
MCV: 92.2 fL (ref 80.0–100.0)
Platelets: 340 10*3/uL (ref 150–400)
RBC: 4.51 MIL/uL (ref 4.22–5.81)
RDW: 13.6 % (ref 11.5–15.5)
WBC: 11.6 10*3/uL — ABNORMAL HIGH (ref 4.0–10.5)
nRBC: 0 % (ref 0.0–0.2)

## 2018-07-01 LAB — LACTIC ACID, PLASMA: Lactic Acid, Venous: 1.9 mmol/L (ref 0.5–1.9)

## 2018-07-01 IMAGING — CR LEFT FOOT - COMPLETE 3+ VIEW
3 series · 3 of 3 positions shown · non-contrast
Comparison: None.

CLINICAL DATA: 42-year-old male with pain and swelling.

EXAM:
LEFT FOOT - COMPLETE 3+ VIEW

[foot ap]
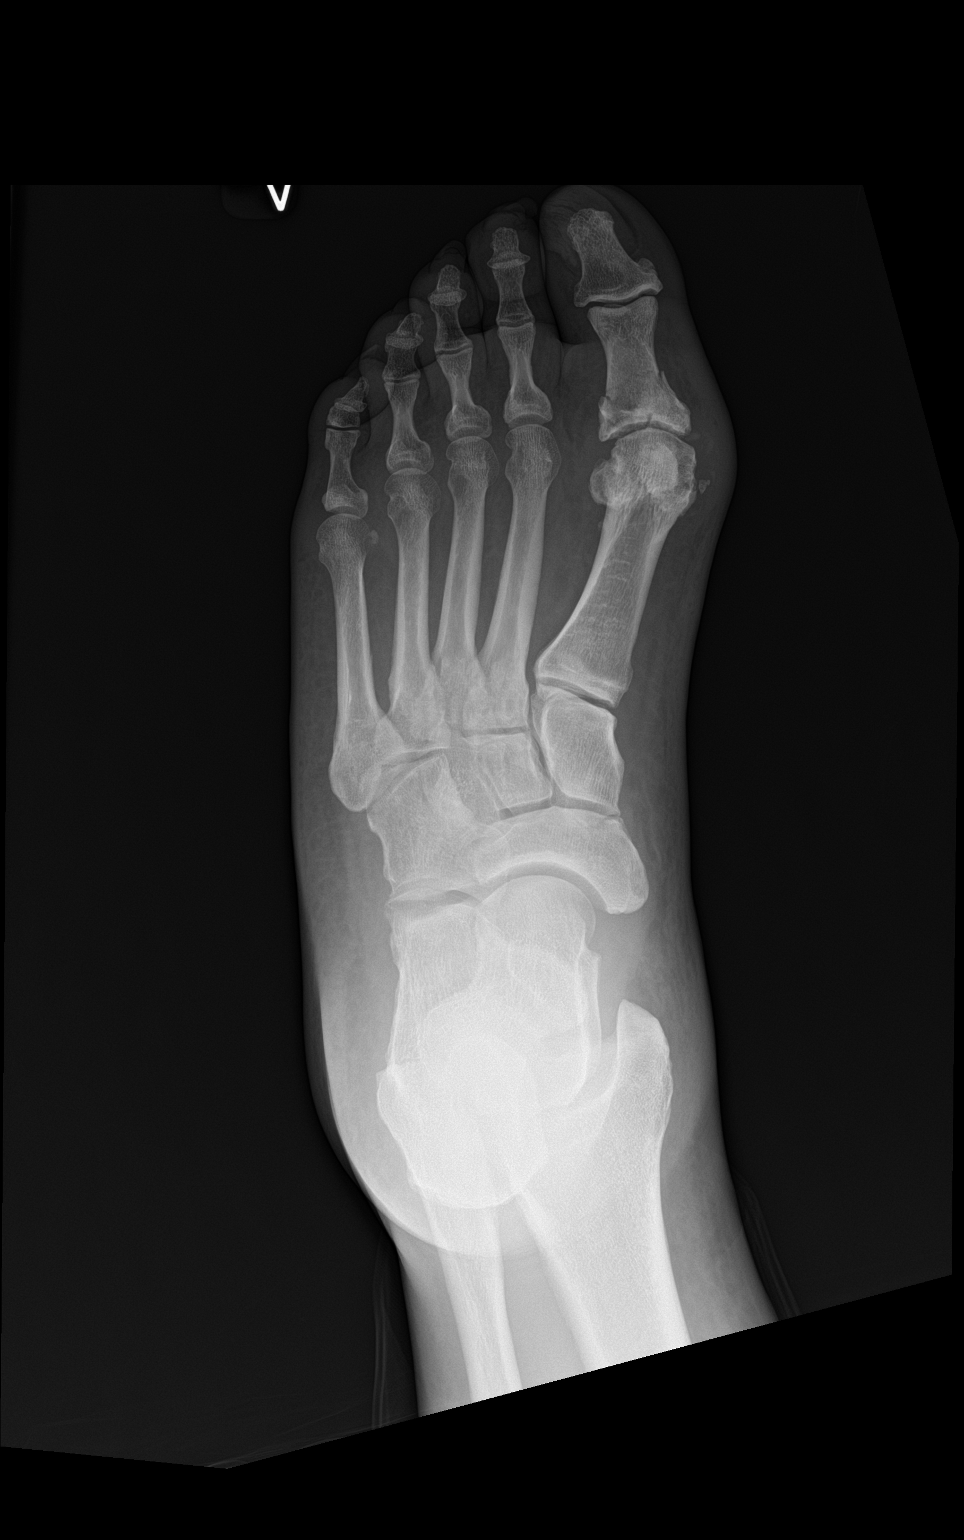

[foot obl]
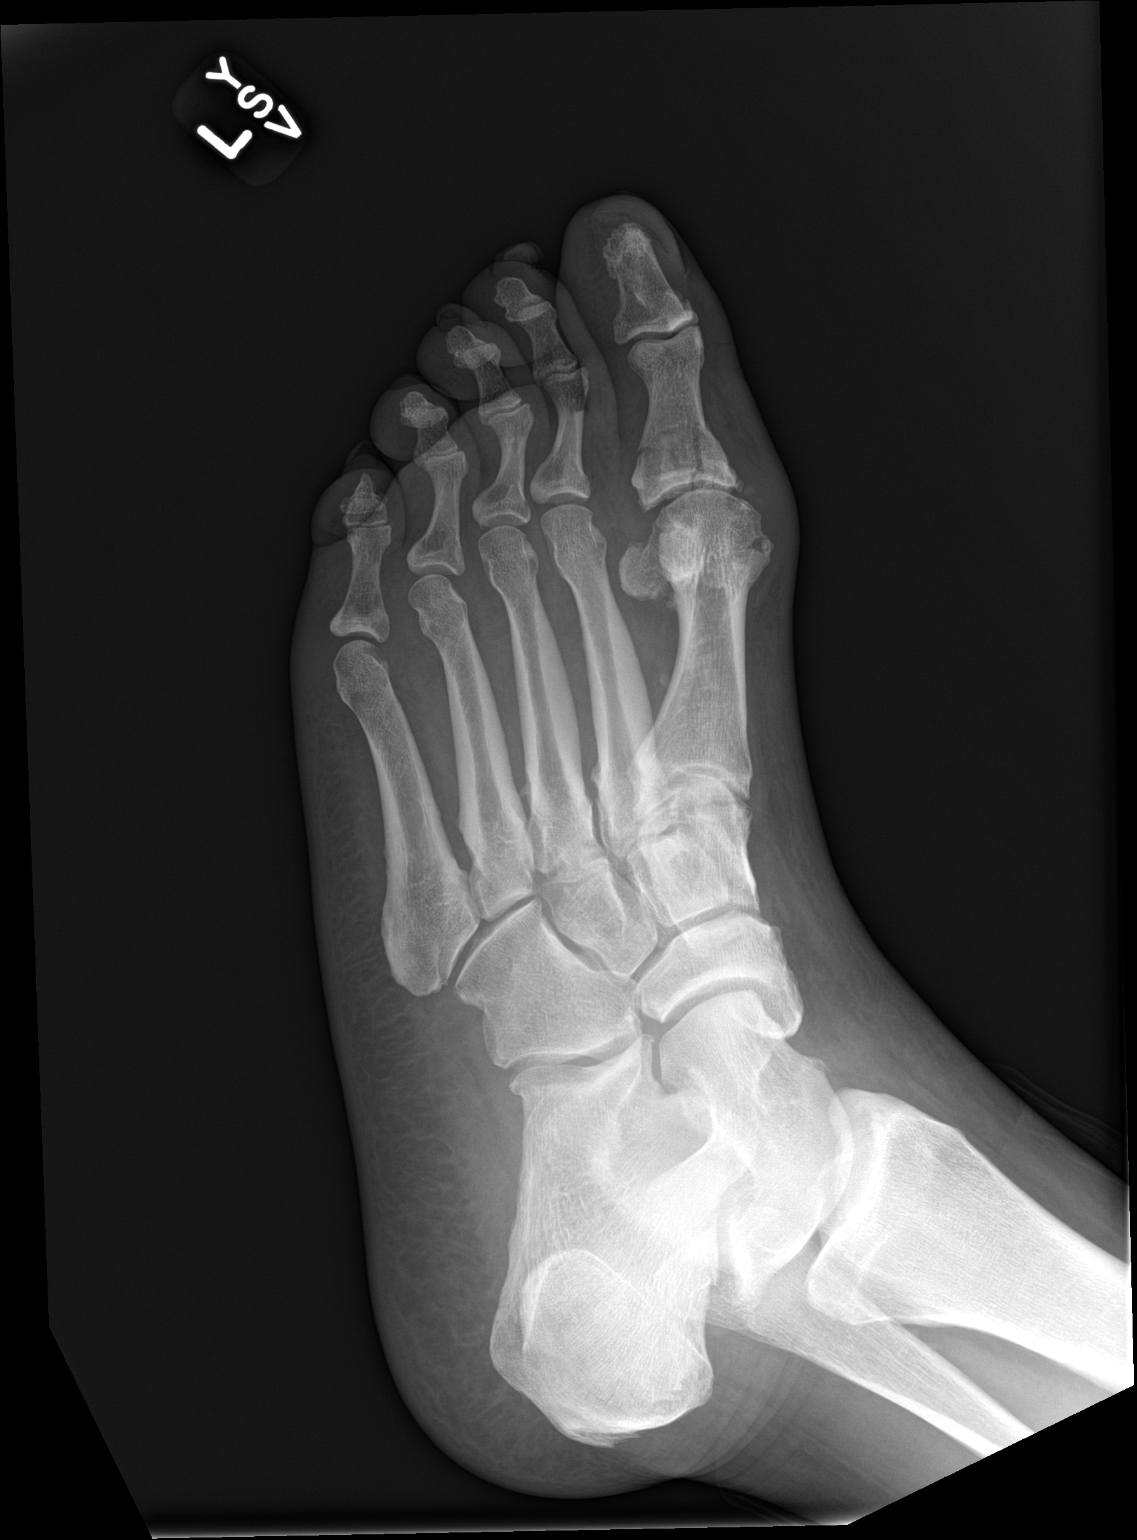

[foot lat]
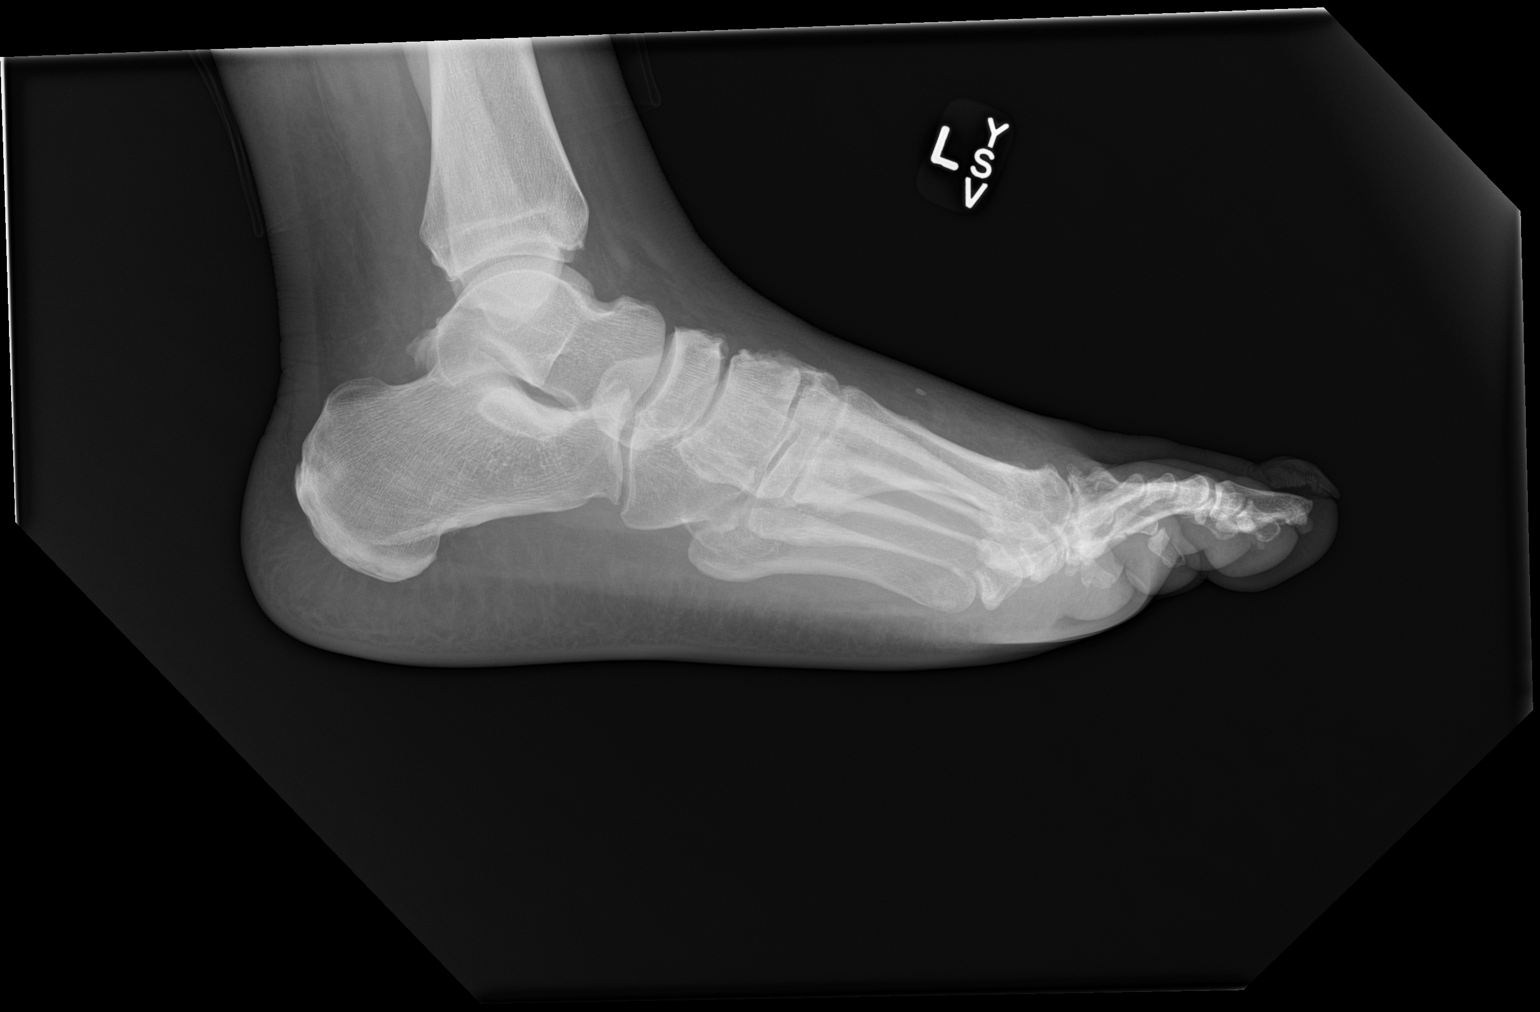

[3 of 3 positions shown; findings below may reference images not displayed]

FINDINGS: Comminuted and intra-articular fracture of the left 1st proximal
phalanx with minimal displacement. The 1st metatarsal appears
intact. There is superimposed 1st MTP joint space loss and
degeneration. Regional soft tissue. The 1st IP joint and distal
phalanx are intact.

No other acute osseous abnormality identified.
IMPRESSION: Comminuted and intra-articular but nondisplaced fracture of the base
of the left 1st proximal phalanx.

## 2018-07-01 MED ORDER — HYDROMORPHONE HCL 1 MG/ML IJ SOLN
1.0000 mg | Freq: Once | INTRAMUSCULAR | Status: AC
  Administered 2018-07-01: 1 mg via INTRAVENOUS
  Filled 2018-07-01: qty 1

## 2018-07-01 MED ORDER — METHOCARBAMOL 500 MG PO TABS
750.0000 mg | ORAL_TABLET | Freq: Once | ORAL | Status: AC
  Administered 2018-07-01: 750 mg via ORAL
  Filled 2018-07-01: qty 2

## 2018-07-01 MED ORDER — HYDROCODONE-ACETAMINOPHEN 5-325 MG PO TABS: 1.0000 | ORAL_TABLET | Freq: Four times a day (QID) | ORAL | 0 refills | Status: DC | PRN

## 2018-07-01 MED ORDER — LEVOFLOXACIN 500 MG PO TABS: 500.0000 mg | ORAL_TABLET | Freq: Every day | ORAL | 0 refills | Status: DC

## 2018-07-01 NOTE — Discharge Instructions (Addendum)
It is recommended that you follow up closely with your doctor for recheck in the next week to insure you are improving. Stop taking Augmentin and Zithromax and switch to Levaquin which will provide better coverage. Take Norco for pain as directed. Return here with any worsening symptoms or new concerns.

## 2018-07-01 NOTE — ED Provider Notes (Signed)
[facility] Provider Note   CSN: [id number] Arrival date & time: 06/30/18  1851     History   Chief Complaint Chief Complaint  Patient presents with   Left Side Body Pain   Left foot numbness   Black Spot on Left Great Toe    HPI Barry MajorsGossett Barry [name] is a 43 y.o. male.     Patient with a history of HLD, DM, obesity, diabetic neuropathy and hypothyroidism presents with left sided pain that is greatest in the lower extremity. He reports significant pain in the foot when he tries to stand or walk. He is not aware of any fever. He was seen in the ED 4 days ago and diagnosed with community acquired PNA. At that time he states his symptoms were generalized body aches and 'not feeling well', and he denies having any cough, chest pain, fever, congestion. His symptoms of aches and feeling unwell persist despite the use of his antibiotics, Amoxil and Zithromax. Now he is having greater pain in his left leg and foot, and he has noticed swelling and discoloration of the distal left foot, all of which prompted return to the emergency department. Still no respiratory symptoms or known fever, although he endorses chills tonight. No nausea or vomiting.   The history is provided by the patient. No language interpreter was used.    Past Medical History:  Diagnosis Date   Carpal tunnel syndrome    Diabetes mellitus without complication (HCC)    Hypercholesteremia    Neuropathy    Obesity    Thyroid disease     Patient Active Problem List   Diagnosis Date Noted   HYPOTHYROIDISM 05/31/2008   DYSLIPIDEMIA 05/31/2008   OBESITY, MORBID 05/31/2008   MYOCARDIAL PERFUSION SCAN, WITH STRESS TEST, ABNORMAL 05/31/2008    Past Surgical History:  Procedure Laterality Date   FRACTURE SURGERY     INCISE AND DRAIN ABCESS     TONSILLECTOMY          Home Medications    Prior to Admission medications   Medication Sig Start Date End Date Taking?  Authorizing Provider  amoxicillin-clavulanate (AUGMENTIN) 875-125 MG tablet Take 1 tablet by mouth every 12 (twelve) hours for 5 days. 06/27/18 07/02/18  Michela PitcherFawze, Mina A, PA-C  atorvastatin (LIPITOR) 80 MG tablet Take 80 mg by mouth every morning. 11/21/17   [provider]  azithromycin (ZITHROMAX) 250 MG tablet Take 1 tablet (250 mg total) by mouth daily. Take first 2 tablets together, then 1 every day until finished. 06/27/18   Luevenia MaxinFawze, Mina A, PA-C  dapagliflozin propanediol (FARXIGA) 10 MG TABS tablet Take 10 mg by mouth daily. 11/20/17   [provider]  hydrochlorothiazide (HYDRODIURIL) 25 MG tablet Take 25 mg by mouth daily. 11/14/16   [provider]  HYDROcodone-acetaminophen (NORCO/VICODIN) 5-325 MG tablet Take 1 tablet by mouth every 6 (six) hours as needed for moderate pain. 11/15/16   Kirichenko, Lemont Fillersatyana, PA-C  levothyroxine (SYNTHROID, LEVOTHROID) 75 MCG tablet Take 75 mcg by mouth daily before breakfast.    [provider]  lisinopril (PRINIVIL,ZESTRIL) 10 MG tablet Take 10 mg by mouth daily.    [provider]  metFORMIN (GLUCOPHAGE) 500 MG tablet Take 1,000 mg by mouth 2 (two) times daily with a meal. 05/14/18   [provider]  naproxen sodium (ANAPROX) 220 MG tablet Take 440 mg by mouth 2 (two) times daily with a meal.    [provider]  vitamin B-12 (CYANOCOBALAMIN) 1000 MCG  tablet Take 1,000 mcg by mouth daily.    [provider]    Family History No family history on file.  Social History Social History   Tobacco Use   Smoking status: Former Smoker   Smokeless tobacco: Never Used  Substance Use Topics   Alcohol use: Yes   Drug use: No     Allergies   Patient has no known allergies.   Review of Systems Review of Systems  Constitutional: Positive for fatigue. Negative for chills.  HENT: Negative.  Negative for congestion and sore throat.   Respiratory: Negative.  Negative for cough and shortness  of breath.   Cardiovascular: Negative.  Negative for chest pain.  Gastrointestinal: Negative.  Negative for abdominal pain and vomiting.  Musculoskeletal:       See HPI.  Skin: Positive for color change.  Neurological: Negative.      Physical Exam Updated Vital Signs BP (!) 94/57 (BP Location: Right Arm)    Pulse (!) 113    Temp 98.6 F (37 C) (Oral)    Resp 20    Ht 6\' 3"  (1.905 m)    Wt (!) 156.5 kg    SpO2 95%    BMI 43.12 kg/m   Physical Exam Vitals signs and nursing note reviewed.  Constitutional:      Appearance: He is well-developed.  HENT:     Head: Normocephalic.  Neck:     Musculoskeletal: Normal range of motion and neck supple.  Cardiovascular:     Rate and Rhythm: Normal rate and regular rhythm.  Pulmonary:     Effort: Pulmonary effort is normal.     Breath sounds: Normal breath sounds. No wheezing, rhonchi or rales.  Abdominal:     General: Bowel sounds are normal.     Palpations: Abdomen is soft.     Tenderness: There is no abdominal tenderness. There is no guarding or rebound.  Musculoskeletal:     Comments: Left foot markedly reddened and swollen over great toe and 1st MTP, extending along plantar surface to 2nd and 3rd toes. No abscess, blister, skin breakdown or wound. Swelling without redness extends to midfoot. There is calf tenderness on the left without swelling when compared to the right.  Skin:    General: Skin is warm and dry.     Findings: No rash.  Neurological:     Mental Status: He is alert and oriented to person, place, and time.      ED Treatments / Results  Labs (all labs ordered are listed, but only abnormal results are displayed) Labs Reviewed  CBC - Abnormal; Notable for the following components:      Result Value   WBC 11.6 (*)    All other components within normal limits  COMPREHENSIVE METABOLIC PANEL  LACTIC ACID, PLASMA    EKG    Radiology No results found.  Procedures Procedures (including critical care  time)  Medications Ordered in ED Medications - No data to display   Initial Impression / Assessment and Plan / ED Course  I have reviewed the triage vital signs and the nursing notes.  Pertinent labs & imaging results that were available during my care of the patient were reviewed by me and considered in my medical decision making (see chart for details).        Patient to Ed with similar symptoms to presentation 4 days ago of generalized body pain and not feeling well. Tonight he has additional symptoms of left foot pain, redness and  swelling.  He is nontoxic in appearance. No hypoxia, mildly tachycardic.  Will give IVF, pain medication, recheck labs to insure no worsening condition or presence of sepsis, image the foot for osteo.   Labs are delayed due to Epic downtime. Ultimately, his lab tests are reassuring. Negative Lactic acid, mild leukocytosis. Cr. 1.62 c/w baseline. Imaging of the foot shows a fracture to proximal phalanx of his great toe. There is swelling attributable to the fracture but there is also redness and warmth c/w possible localized infection. He is currently taking amoxil and z-pack for PNA without improvement. Will switch to Levaquin for coverage of both PNA and if there is any cellulitic component to the foot injury. Post Op Shoe applied to the left foot.   He is still tachycardic and afebrile. On review of chart from 06/27/18, he underwent extensive studies for clotting including CTA, and bilateral LE dopplers. There were negative. COVID was also negative. Review of the chart shows frequent tachycardia or borderline tachycardia.   He is seen by Dr. Stark Jock and is felt appropriate for discharge home.     Final Clinical Impressions(s) / ED Diagnoses   Final diagnoses:  None   1. Left great toe fracture.  2. General malaise 3. Tachycardia  ED Discharge Orders    None       Charlann Lange, PA-C 07/01/18 1540    Veryl Speak, MD 07/01/18 [phone]

## 2018-07-02 DIAGNOSIS — M25522 Pain in left elbow: Secondary | ICD-10-CM | POA: Insufficient documentation

## 2018-07-02 DIAGNOSIS — M25562 Pain in left knee: Secondary | ICD-10-CM | POA: Diagnosis not present

## 2018-07-02 DIAGNOSIS — M25469 Effusion, unspecified knee: Secondary | ICD-10-CM | POA: Diagnosis not present

## 2018-07-06 LAB — CULTURE, BLOOD (ROUTINE X 2)
Culture: NO GROWTH
Culture: NO GROWTH

## 2018-07-08 DIAGNOSIS — M79642 Pain in left hand: Secondary | ICD-10-CM | POA: Diagnosis not present

## 2018-07-08 DIAGNOSIS — M25562 Pain in left knee: Secondary | ICD-10-CM | POA: Diagnosis not present

## 2018-07-22 DIAGNOSIS — E039 Hypothyroidism, unspecified: Secondary | ICD-10-CM | POA: Diagnosis not present

## 2018-07-22 DIAGNOSIS — E782 Mixed hyperlipidemia: Secondary | ICD-10-CM | POA: Diagnosis not present

## 2018-07-22 DIAGNOSIS — Z794 Long term (current) use of insulin: Secondary | ICD-10-CM | POA: Diagnosis not present

## 2018-07-22 DIAGNOSIS — E1142 Type 2 diabetes mellitus with diabetic polyneuropathy: Secondary | ICD-10-CM | POA: Diagnosis not present

## 2018-07-25 DIAGNOSIS — E1140 Type 2 diabetes mellitus with diabetic neuropathy, unspecified: Secondary | ICD-10-CM | POA: Diagnosis not present

## 2018-07-25 DIAGNOSIS — R600 Localized edema: Secondary | ICD-10-CM | POA: Diagnosis not present

## 2018-07-25 DIAGNOSIS — Z7982 Long term (current) use of aspirin: Secondary | ICD-10-CM | POA: Diagnosis not present

## 2018-07-25 DIAGNOSIS — I10 Essential (primary) hypertension: Secondary | ICD-10-CM | POA: Diagnosis not present

## 2018-07-25 DIAGNOSIS — E800 Hereditary erythropoietic porphyria: Secondary | ICD-10-CM | POA: Diagnosis not present

## 2018-07-25 DIAGNOSIS — R609 Edema, unspecified: Secondary | ICD-10-CM | POA: Diagnosis not present

## 2018-07-25 DIAGNOSIS — Z794 Long term (current) use of insulin: Secondary | ICD-10-CM | POA: Diagnosis not present

## 2018-07-25 DIAGNOSIS — Z79899 Other long term (current) drug therapy: Secondary | ICD-10-CM | POA: Diagnosis not present

## 2018-07-25 DIAGNOSIS — K219 Gastro-esophageal reflux disease without esophagitis: Secondary | ICD-10-CM | POA: Diagnosis not present

## 2018-07-25 DIAGNOSIS — Z7984 Long term (current) use of oral hypoglycemic drugs: Secondary | ICD-10-CM | POA: Diagnosis not present

## 2018-07-25 DIAGNOSIS — U071 COVID-19: Secondary | ICD-10-CM | POA: Diagnosis not present

## 2018-07-25 DIAGNOSIS — R0602 Shortness of breath: Secondary | ICD-10-CM | POA: Diagnosis not present

## 2018-08-03 DIAGNOSIS — R0602 Shortness of breath: Secondary | ICD-10-CM | POA: Diagnosis not present

## 2018-08-03 DIAGNOSIS — E1100 Type 2 diabetes mellitus with hyperosmolarity without nonketotic hyperglycemic-hyperosmolar coma (NKHHC): Secondary | ICD-10-CM | POA: Diagnosis not present

## 2018-08-03 DIAGNOSIS — U071 COVID-19: Secondary | ICD-10-CM | POA: Diagnosis not present

## 2018-08-03 DIAGNOSIS — R05 Cough: Secondary | ICD-10-CM | POA: Diagnosis not present

## 2018-08-04 DIAGNOSIS — U071 COVID-19: Secondary | ICD-10-CM | POA: Insufficient documentation

## 2018-08-04 DIAGNOSIS — E1100 Type 2 diabetes mellitus with hyperosmolarity without nonketotic hyperglycemic-hyperosmolar coma (NKHHC): Secondary | ICD-10-CM | POA: Insufficient documentation

## 2018-08-04 DIAGNOSIS — J1282 Pneumonia due to coronavirus disease 2019: Secondary | ICD-10-CM | POA: Insufficient documentation

## 2018-08-05 DIAGNOSIS — N183 Chronic kidney disease, stage 3 unspecified: Secondary | ICD-10-CM | POA: Insufficient documentation

## 2018-08-05 DIAGNOSIS — N179 Acute kidney failure, unspecified: Secondary | ICD-10-CM | POA: Insufficient documentation

## 2018-08-05 DIAGNOSIS — N1832 Chronic kidney disease, stage 3b: Secondary | ICD-10-CM | POA: Insufficient documentation

## 2018-08-14 DIAGNOSIS — R609 Edema, unspecified: Secondary | ICD-10-CM | POA: Diagnosis not present

## 2018-08-14 DIAGNOSIS — I252 Old myocardial infarction: Secondary | ICD-10-CM | POA: Diagnosis not present

## 2018-08-14 DIAGNOSIS — K219 Gastro-esophageal reflux disease without esophagitis: Secondary | ICD-10-CM | POA: Diagnosis not present

## 2018-08-14 DIAGNOSIS — Z7982 Long term (current) use of aspirin: Secondary | ICD-10-CM | POA: Diagnosis not present

## 2018-08-14 DIAGNOSIS — Z79899 Other long term (current) drug therapy: Secondary | ICD-10-CM | POA: Diagnosis not present

## 2018-08-14 DIAGNOSIS — I10 Essential (primary) hypertension: Secondary | ICD-10-CM | POA: Diagnosis not present

## 2018-08-14 DIAGNOSIS — M545 Low back pain: Secondary | ICD-10-CM | POA: Diagnosis not present

## 2018-08-14 DIAGNOSIS — R918 Other nonspecific abnormal finding of lung field: Secondary | ICD-10-CM | POA: Diagnosis not present

## 2018-08-14 DIAGNOSIS — Z794 Long term (current) use of insulin: Secondary | ICD-10-CM | POA: Diagnosis not present

## 2018-08-14 DIAGNOSIS — Z20828 Contact with and (suspected) exposure to other viral communicable diseases: Secondary | ICD-10-CM | POA: Diagnosis not present

## 2018-08-14 DIAGNOSIS — R600 Localized edema: Secondary | ICD-10-CM | POA: Diagnosis not present

## 2018-08-14 DIAGNOSIS — E119 Type 2 diabetes mellitus without complications: Secondary | ICD-10-CM | POA: Diagnosis not present

## 2018-08-14 DIAGNOSIS — Z7984 Long term (current) use of oral hypoglycemic drugs: Secondary | ICD-10-CM | POA: Diagnosis not present

## 2018-08-14 DIAGNOSIS — E7800 Pure hypercholesterolemia, unspecified: Secondary | ICD-10-CM | POA: Diagnosis not present

## 2018-11-03 ENCOUNTER — Other Ambulatory Visit: Payer: Self-pay

## 2018-11-03 ENCOUNTER — Encounter (HOSPITAL_COMMUNITY): Payer: Self-pay

## 2018-11-03 ENCOUNTER — Emergency Department (HOSPITAL_COMMUNITY)
Admission: EM | Admit: 2018-11-03 | Discharge: 2018-11-04 | Disposition: A | Discharge status: Home / Self Care | Payer: BC Managed Care – PPO | Attending: Emergency Medicine | Admitting: Emergency Medicine

## 2018-11-03 ENCOUNTER — Emergency Department (HOSPITAL_COMMUNITY): Payer: BC Managed Care – PPO

## 2018-11-03 DIAGNOSIS — E039 Hypothyroidism, unspecified: Secondary | ICD-10-CM | POA: Diagnosis not present

## 2018-11-03 DIAGNOSIS — R7989 Other specified abnormal findings of blood chemistry: Secondary | ICD-10-CM | POA: Diagnosis not present

## 2018-11-03 DIAGNOSIS — R079 Chest pain, unspecified: Secondary | ICD-10-CM | POA: Diagnosis not present

## 2018-11-03 DIAGNOSIS — Z79899 Other long term (current) drug therapy: Secondary | ICD-10-CM | POA: Insufficient documentation

## 2018-11-03 DIAGNOSIS — E119 Type 2 diabetes mellitus without complications: Secondary | ICD-10-CM | POA: Diagnosis not present

## 2018-11-03 DIAGNOSIS — R0602 Shortness of breath: Secondary | ICD-10-CM | POA: Diagnosis not present

## 2018-11-03 DIAGNOSIS — R0789 Other chest pain: Secondary | ICD-10-CM

## 2018-11-03 DIAGNOSIS — Z794 Long term (current) use of insulin: Secondary | ICD-10-CM | POA: Insufficient documentation

## 2018-11-03 DIAGNOSIS — Z87891 Personal history of nicotine dependence: Secondary | ICD-10-CM | POA: Insufficient documentation

## 2018-11-03 DIAGNOSIS — R0781 Pleurodynia: Secondary | ICD-10-CM | POA: Diagnosis not present

## 2018-11-03 IMAGING — DX DG CHEST 2V
2 series · 2 of 2 positions shown · non-contrast
Comparison: Chest radiograph dated [DATE].

CLINICAL DATA: 43-year-old male with chest pain.

EXAM:
CHEST - 2 VIEW

[chest pa]
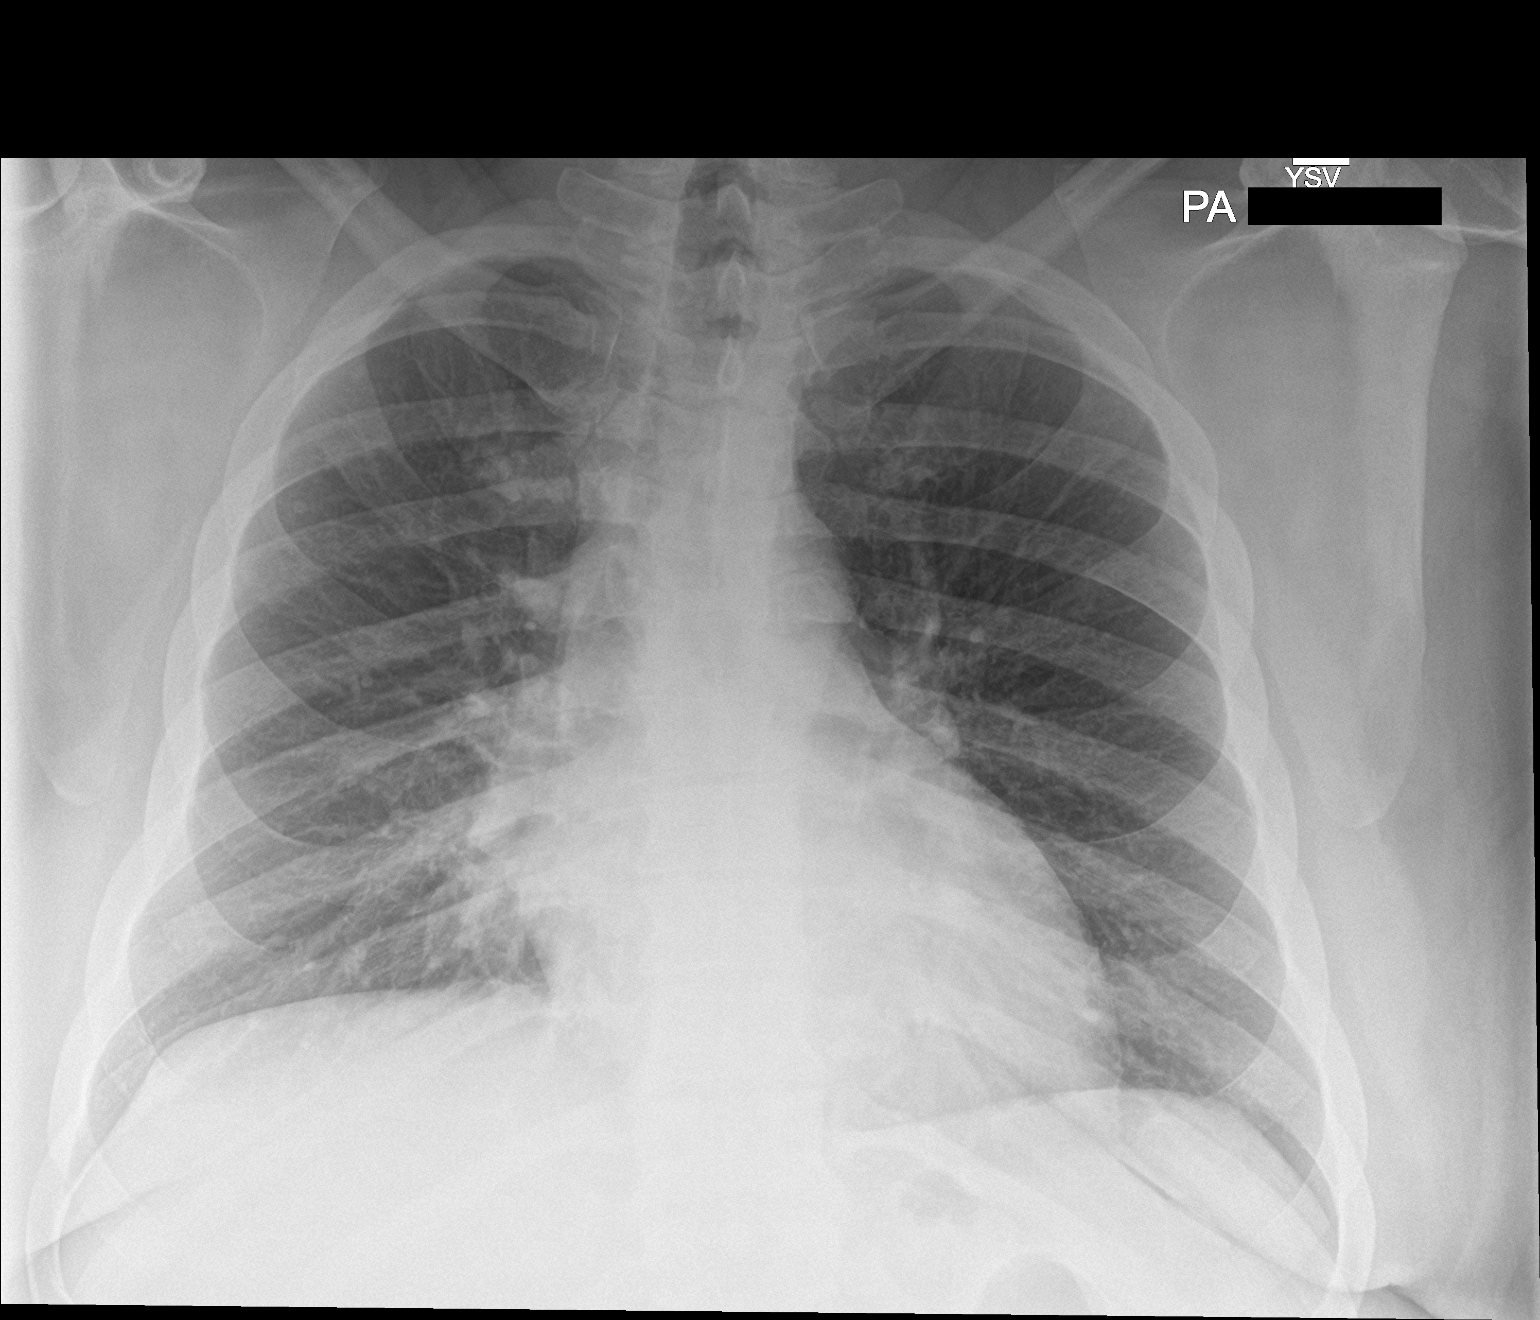

[chest lat]
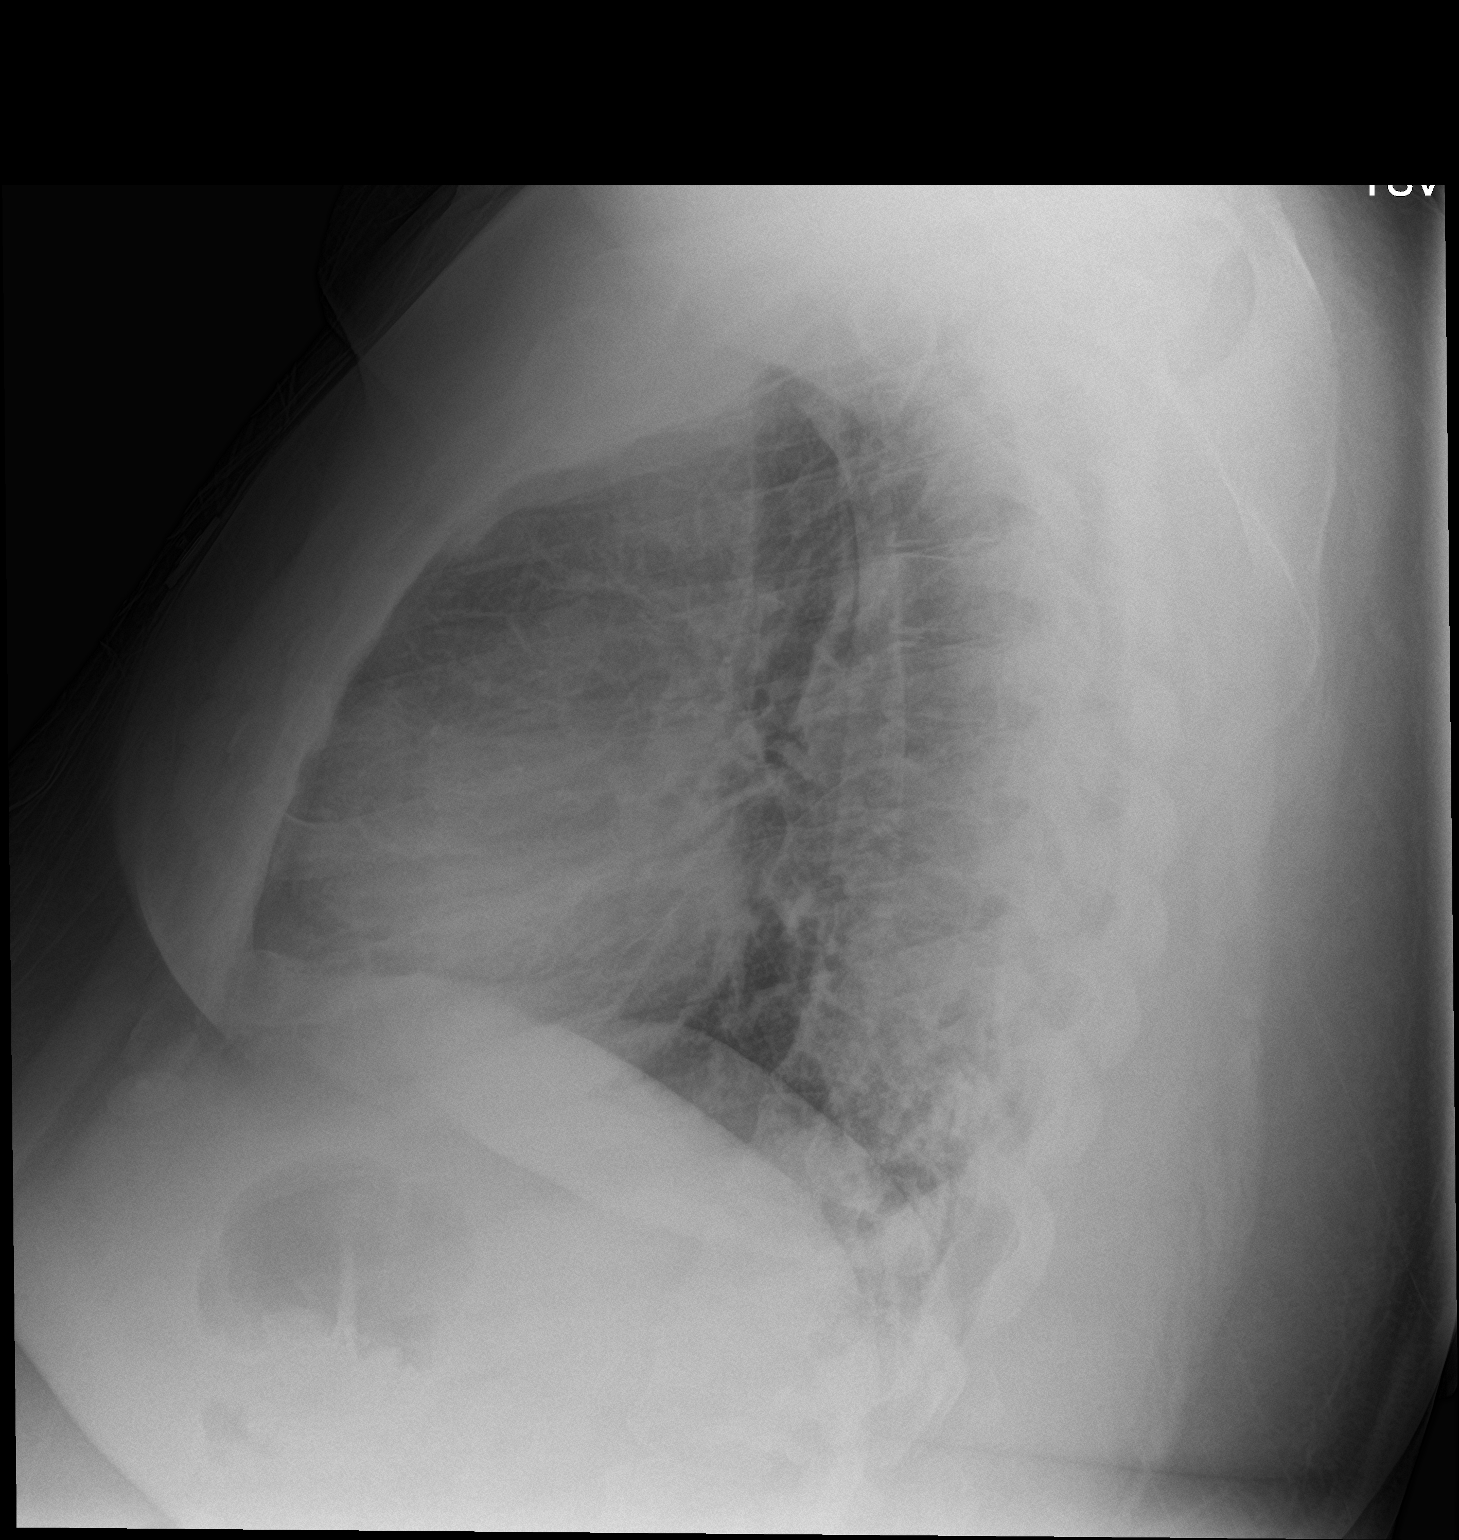

[2 of 2 positions shown; findings below may reference images not displayed]

FINDINGS: Right perihilar density may represent vascular congestion.
Developing infiltrate is not excluded. Clinical correlation is
recommended. No focal consolidation, pleural effusion pneumothorax.
Top-normal cardiac size. No acute osseous pathology.
IMPRESSION: Right perihilar density may represent vascular confluence or
congestion. Developing infiltrate is not excluded. Clinical
correlation is recommended.

## 2018-11-03 MED ORDER — SODIUM CHLORIDE 0.9% FLUSH: 3.0000 mL | Freq: Once | INTRAVENOUS | Status: DC

## 2018-11-03 NOTE — ED Triage Notes (Signed)
Pt reports chest pain and SOB over the past few days, Sob worse tonight before going into work. Resp e.u at this time.

## 2018-11-04 ENCOUNTER — Telehealth: Payer: Self-pay | Admitting: *Deleted

## 2018-11-04 ENCOUNTER — Emergency Department (HOSPITAL_COMMUNITY): Payer: BC Managed Care – PPO

## 2018-11-04 DIAGNOSIS — R7989 Other specified abnormal findings of blood chemistry: Secondary | ICD-10-CM | POA: Diagnosis not present

## 2018-11-04 DIAGNOSIS — R079 Chest pain, unspecified: Secondary | ICD-10-CM | POA: Diagnosis not present

## 2018-11-04 LAB — CBC
HCT: 38.1 % — ABNORMAL LOW (ref 39.0–52.0)
Hemoglobin: 12.8 g/dL — ABNORMAL LOW (ref 13.0–17.0)
MCH: 31.1 pg (ref 26.0–34.0)
MCHC: 33.6 g/dL (ref 30.0–36.0)
MCV: 92.5 fL (ref 80.0–100.0)
Platelets: 388 10*3/uL (ref 150–400)
RBC: 4.12 MIL/uL — ABNORMAL LOW (ref 4.22–5.81)
RDW: 14.8 % (ref 11.5–15.5)
WBC: 10.4 10*3/uL (ref 4.0–10.5)
nRBC: 0 % (ref 0.0–0.2)

## 2018-11-04 LAB — BRAIN NATRIURETIC PEPTIDE: B Natriuretic Peptide: 16 pg/mL (ref 0.0–100.0)

## 2018-11-04 LAB — HEPATIC FUNCTION PANEL
ALT: 11 U/L (ref 0–44)
AST: 24 U/L (ref 15–41)
Albumin: 3.1 g/dL — ABNORMAL LOW (ref 3.5–5.0)
Alkaline Phosphatase: 55 U/L (ref 38–126)
Bilirubin, Direct: 0.3 mg/dL — ABNORMAL HIGH (ref 0.0–0.2)
Indirect Bilirubin: 1.2 mg/dL — ABNORMAL HIGH (ref 0.3–0.9)
Total Bilirubin: 1.5 mg/dL — ABNORMAL HIGH (ref 0.3–1.2)
Total Protein: 7.2 g/dL (ref 6.5–8.1)

## 2018-11-04 LAB — D-DIMER, QUANTITATIVE: D-Dimer, Quant: 1.8 ug/mL-FEU — ABNORMAL HIGH (ref 0.00–0.50)

## 2018-11-04 LAB — BASIC METABOLIC PANEL
Anion gap: 12 (ref 5–15)
BUN: 15 mg/dL (ref 6–20)
CO2: 24 mmol/L (ref 22–32)
Calcium: 9.3 mg/dL (ref 8.9–10.3)
Chloride: 102 mmol/L (ref 98–111)
Creatinine, Ser: 1.57 mg/dL — ABNORMAL HIGH (ref 0.61–1.24)
GFR calc Af Amer: >60 mL/min (ref >60)
GFR calc non Af Amer: 54 mL/min — ABNORMAL LOW (ref >60)
Glucose, Bld: 79 mg/dL (ref 70–99)
Potassium: 4.1 mmol/L (ref 3.5–5.1)
Sodium: 138 mmol/L (ref 135–145)

## 2018-11-04 LAB — TROPONIN I (HIGH SENSITIVITY)
Troponin I (High Sensitivity): 8 ng/L (ref <18)
Troponin I (High Sensitivity): 8 ng/L (ref <18)

## 2018-11-04 LAB — LIPASE, BLOOD: Lipase: 27 U/L (ref 11–51)

## 2018-11-04 IMAGING — CT CT ANGIO CHEST
2 of 6 series · 18 of 46 positions shown · IV contrast (omnipaque)
Comparison: [DATE] and previous

CLINICAL DATA: Pleuritic chest pain, elevated D-dimer, shortness of
breath, hypoxia

EXAM:
CT ANGIOGRAPHY CHEST WITH CONTRAST
TECHNIQUE: Multidetector CT imaging of the chest was performed using the
standard protocol during bolus administration of intravenous
contrast. Multiplanar CT image reconstructions and MIPs were
obtained to evaluate the vascular anatomy.
CONTRAST:  100mL OMNIPAQUE IOHEXOL 350 MG/ML SOLN

[Series 6: thins · axial · 0.80mm/px · z∈[+1166,+1393]mm · 15 of 249 slices shown]
[im 11/249  lung]
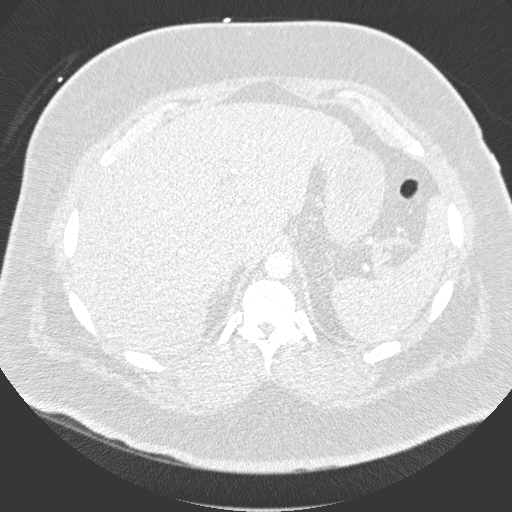
[im 33/249  soft-tissue]
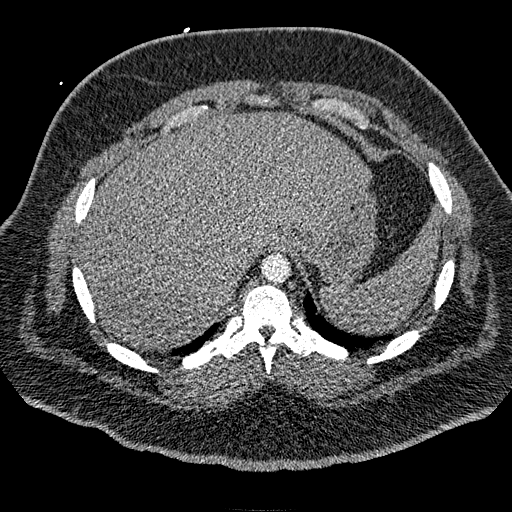
[im 44/249  lung]
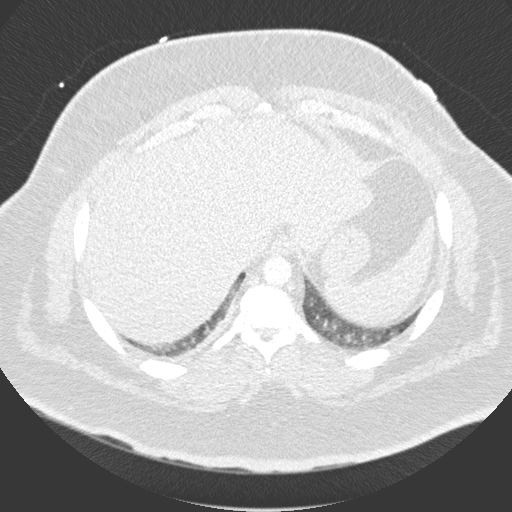
[im 65/249  soft-tissue]
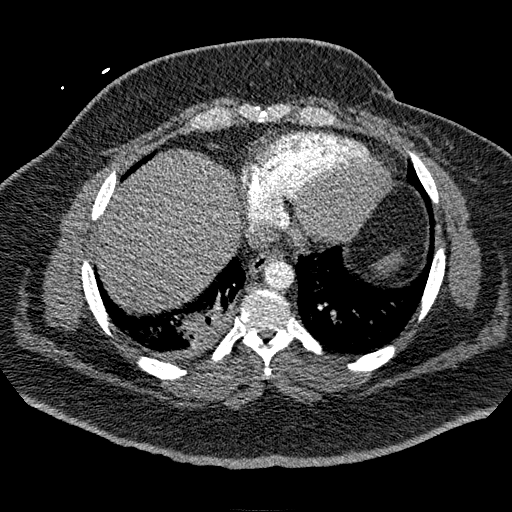
[im 76/249  lung]
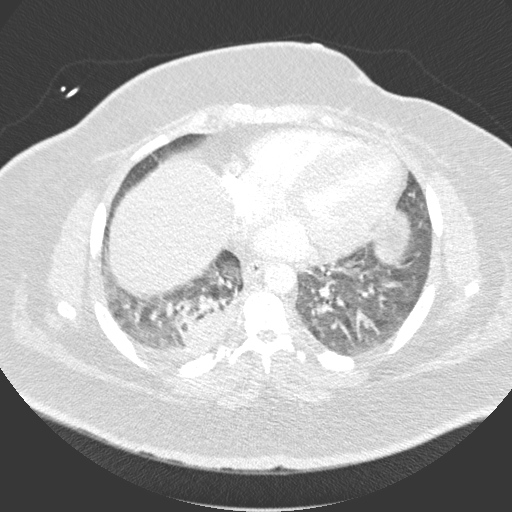
[im 98/249  soft-tissue]
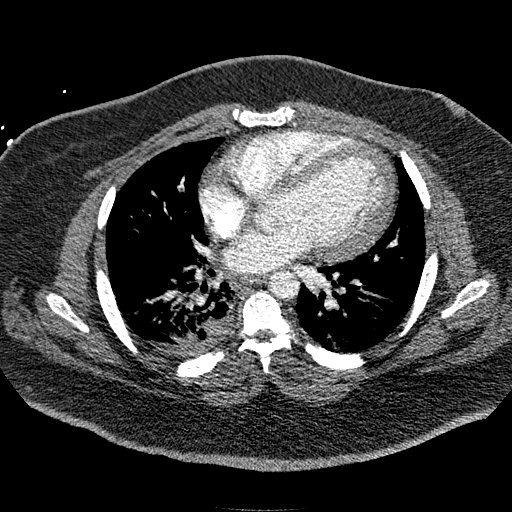
[im 108/249  lung]
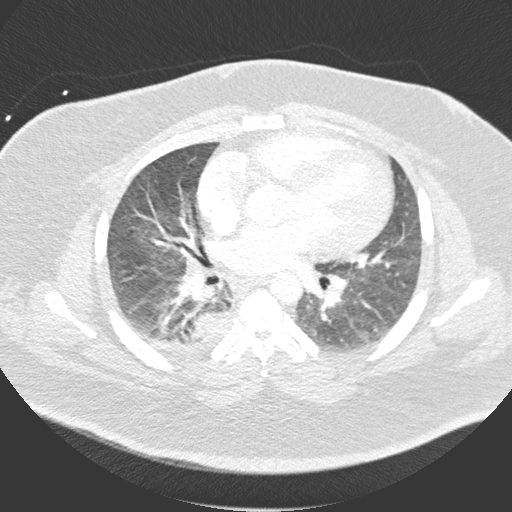
[im 130/249  soft-tissue]
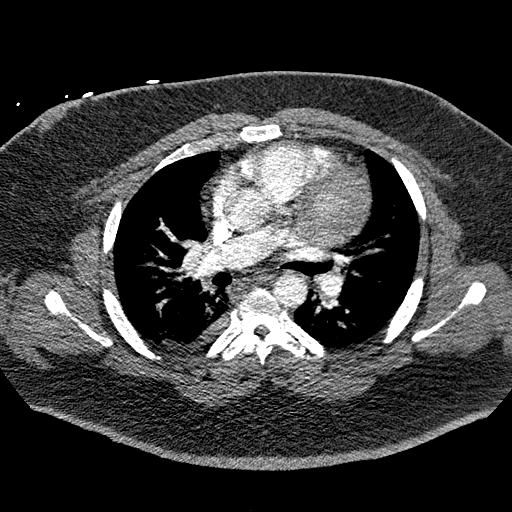
[im 141/249  lung]
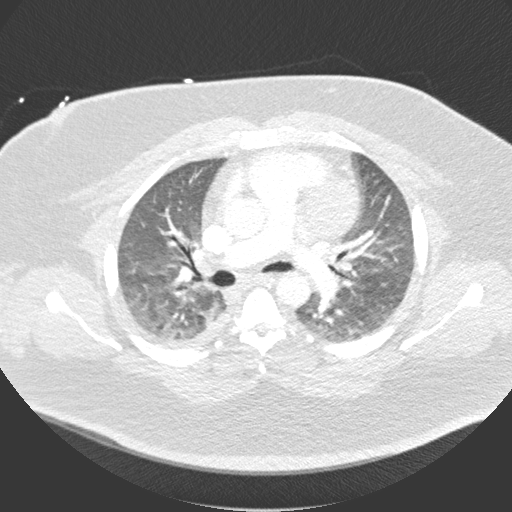
[im 151/249  soft-tissue]
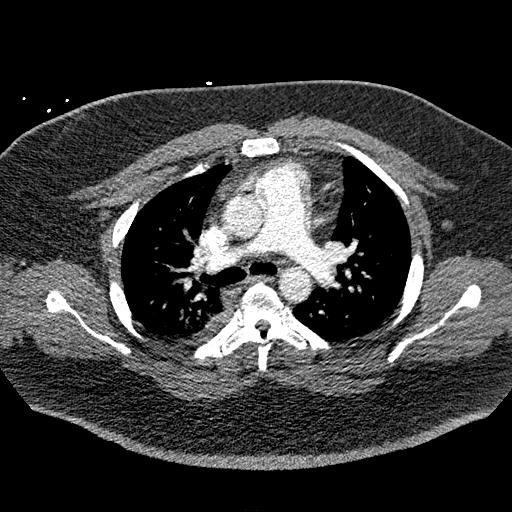
[im 173/249  lung]
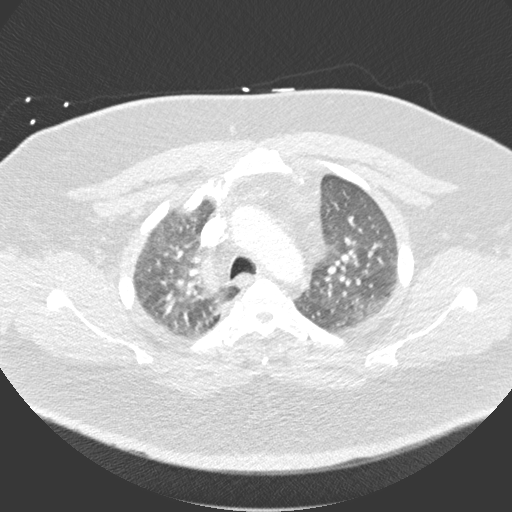
[im 184/249  soft-tissue]
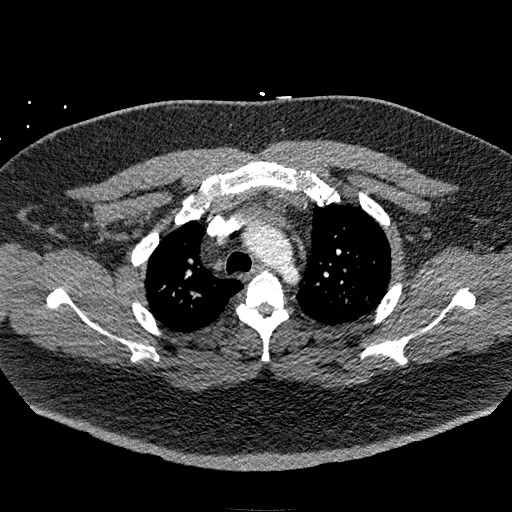
[im 205/249  lung]
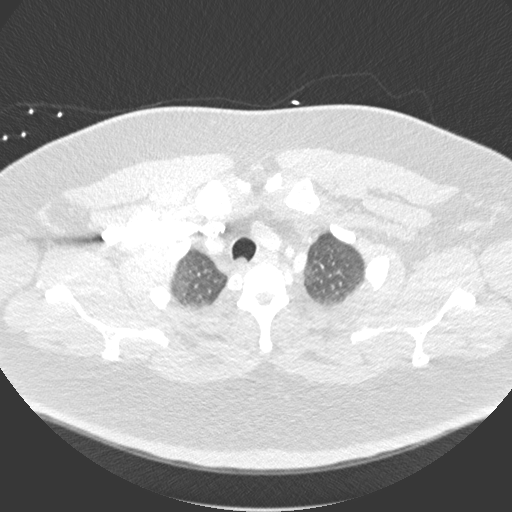
[im 216/249  soft-tissue]
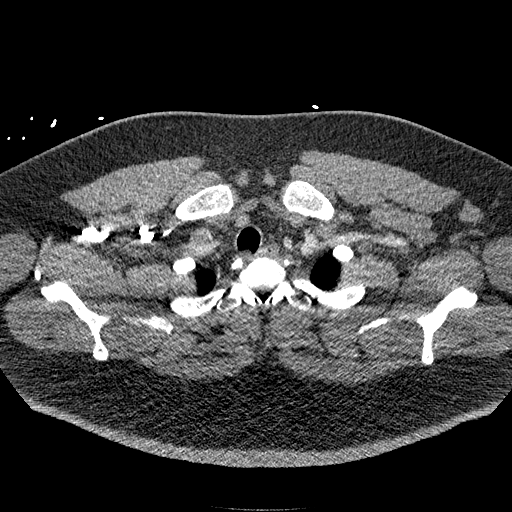
[im 238/249  lung]
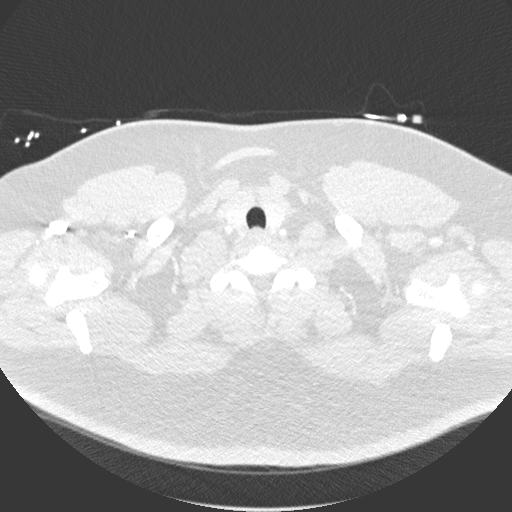

[Series 8: coronal mpr · coronal · 0.50mm/px · 3 of 151 slices shown]
[im 38/151  soft-tissue]
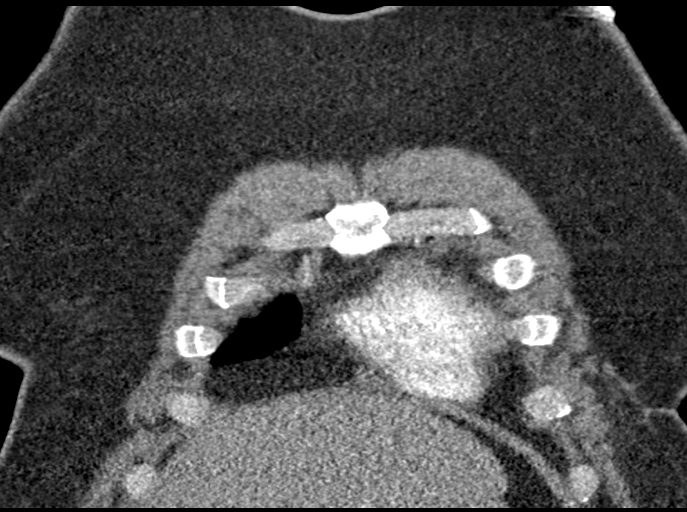
[im 76/151  soft-tissue]
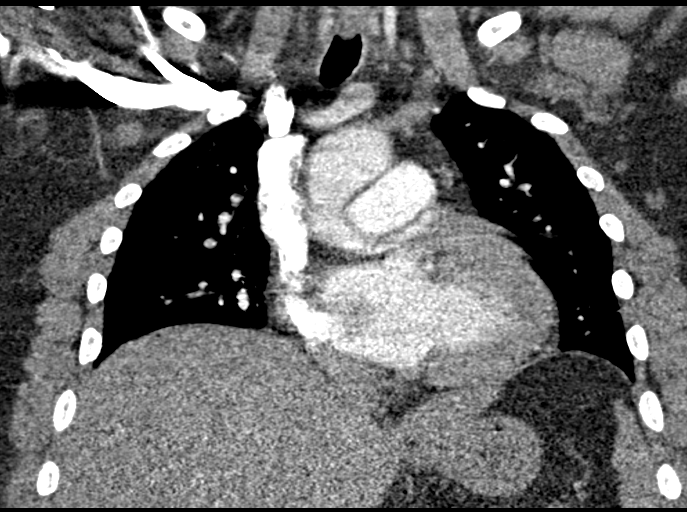
[im 113/151  soft-tissue]
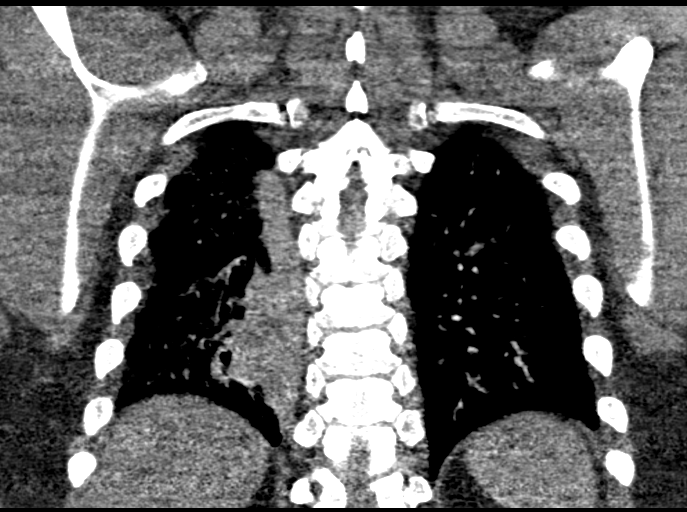

[18 of 46 positions shown; findings below may reference images not displayed]

FINDINGS: Cardiovascular: Heart size normal. No pericardial effusion. The RV
is nondilated. Good contrast opacification of pulmonary artery
branches. No convincing filling defects to suggest acute PE.
Adequate contrast opacification of the thoracic aorta with no
evidence of dissection, aneurysm, or stenosis. There is classic 3
vessel brachiocephalic arch anatomy without proximal stenosis. No
significant atheromatous change.

Mediastinum/Nodes: No hilar or mediastinal adenopathy. Esophagus
nondistended.

Lungs/Pleura: Small right pleural effusion. No pneumothorax.
Pleural-based nodular consolidation in the posteromedial right lung
base, similar to prior study, and seen on prior studies dating back
to [DATE], with regional parenchymal distortion including mild
swirling of adjacent vessels favoring round atelectasis over
infectious/inflammatory etiologies or neoplasm.

Upper Abdomen: No acute findings.

Musculoskeletal: No chest wall abnormality. No acute or significant
osseous findings.

Review of the MIP images confirms the above findings.
IMPRESSION: 1. Negative for acute PE or thoracic aortic dissection.
2. Probable round atelectasis in the right lower lobe with chronic
small right pleural effusion.

## 2018-11-04 MED ORDER — AZITHROMYCIN 250 MG PO TABS: 250.0000 mg | ORAL_TABLET | Freq: Every day | ORAL | 0 refills | Status: DC

## 2018-11-04 MED ORDER — IOHEXOL 350 MG/ML SOLN
100.0000 mL | Freq: Once | INTRAVENOUS | Status: AC | PRN
  Administered 2018-11-04: 09:00:00 100 mL via INTRAVENOUS

## 2018-11-04 MED ORDER — OXYCODONE-ACETAMINOPHEN 5-325 MG PO TABS
1.0000 | ORAL_TABLET | Freq: Once | ORAL | Status: AC
  Administered 2018-11-04: 07:00:00 1 via ORAL
  Filled 2018-11-04: qty 1

## 2018-11-04 NOTE — ED Provider Notes (Signed)
[place] EMERGENCY DEPARTMENT Provider Note   CSN: [id number] Arrival date & time: 11/03/18  2323     History   Chief Complaint Chief Complaint  Patient presents with  . Chest Pain  . Shortness of Breath    HPI Barry [name] is a 43 y.o. male.     The history is provided by the patient and medical records. No language interpreter was used.  Chest Pain Associated symptoms: nausea and shortness of breath   Associated symptoms: no cough, no palpitations and no vomiting   Shortness of Breath Associated symptoms: chest pain   Associated symptoms: no cough and no vomiting    Barry [name] is a 43 y.o. male  with a PMH as listed below including DM, HLD who presents to the Emergency Department complaining of constant chest pain which began 3 days ago.  Pain is worse with inspiration as well as lying on his left side.  Associated with shortness of breath.  Denies history of similar.  Pain initially started on the left, but now feels like it is in the center of his chest.  Intermittently has had some upper abdominal discomfort and nausea, but none currently.  No vomiting or change in bowel habitus.  No fever.  No cough.  Does feel like he has been a little congested.  He had coronavirus back in July and feels as if he has completely recovered from this.  Denies any recent surgery/travel/immobilization. Does report history of neuropathy, but denies any acute changes in his lower extremity pain.  Does report worsening lower extremity swelling bilaterally the last couple of days.   Past Medical History:  Diagnosis Date  . Carpal tunnel syndrome   . Diabetes mellitus without complication ([place])   . Hypercholesteremia   . Neuropathy   . Obesity   . Thyroid disease     Patient Active Problem List   Diagnosis Date Noted  . HYPOTHYROIDISM 05/31/2008  . DYSLIPIDEMIA 05/31/2008  . OBESITY, MORBID 05/31/2008  . MYOCARDIAL PERFUSION SCAN, WITH STRESS TEST, ABNORMAL  05/31/2008    Past Surgical History:  Procedure Laterality Date  . FRACTURE SURGERY    . INCISE AND DRAIN ABCESS    . TONSILLECTOMY          Home Medications    Prior to Admission medications   Medication Sig Start Date End Date Taking? Authorizing Provider  atorvastatin (LIPITOR) 80 MG tablet Take 80 mg by mouth daily.  11/21/17   [provider]  cetirizine (ZYRTEC) 10 MG tablet Take 10 mg by mouth daily.    [provider]  dapagliflozin propanediol (FARXIGA) 10 MG TABS tablet Take 10 mg by mouth at bedtime.  11/20/17   [provider]  HUMALOG MIX 75/25 (75-25) 100 UNIT/ML SUSP injection Inject 100 Units into the skin 2 (two) times a day. 06/07/18   [provider]  HYDROcodone-acetaminophen (NORCO/VICODIN) 5-325 MG tablet Take 1-2 tablets by mouth every 6 (six) hours as needed for moderate pain or severe pain. 07/01/18   Charlann Lange, PA-C  levofloxacin (LEVAQUIN) 500 MG tablet Take 1 tablet (500 mg total) by mouth daily. 07/01/18   Charlann Lange, PA-C  levothyroxine (SYNTHROID, LEVOTHROID) 75 MCG tablet Take 75 mcg by mouth at bedtime.     [provider]  lisinopril-hydrochlorothiazide (ZESTORETIC) 10-12.5 MG tablet Take 1 tablet by mouth 2 (two) times daily. 05/14/18   [provider]  metFORMIN (GLUCOPHAGE) 500 MG tablet Take 1,000 mg by mouth 2 (two)  times daily with a meal. 05/14/18   [provider]  OZEMPIC, 0.25 OR 0.5 MG/DOSE, 2 MG/1.5ML SOPN Inject 0.25 mg into the skin every Friday. 06/24/18   [provider]    Family History No family history on file.  Social History Social History   Tobacco Use  . Smoking status: Former Games developermoker  . Smokeless tobacco: Never Used  Substance Use Topics  . Alcohol use: Yes  . Drug use: No     Allergies   Patient has no known allergies.   Review of Systems Review of Systems  Respiratory: Positive for shortness of breath. Negative for cough.    Cardiovascular: Positive for chest pain and leg swelling. Negative for palpitations.  Gastrointestinal: Positive for nausea. Negative for constipation, diarrhea and vomiting.  All other systems reviewed and are negative.    Physical Exam Updated Vital Signs BP 127/77   Pulse (!) 106   Temp 98.9 F (37.2 C) (Oral)   Resp 18   SpO2 99%   Physical Exam Vitals signs and nursing note reviewed.  Constitutional:      General: He is not in acute distress.    Appearance: He is well-developed.  HENT:     Head: Normocephalic and atraumatic.  Neck:     Musculoskeletal: Neck supple.  Cardiovascular:     Rate and Rhythm: Regular rhythm. Tachycardia present.     Heart sounds: Normal heart sounds. No murmur.  Pulmonary:     Effort: Pulmonary effort is normal. No respiratory distress.     Breath sounds: Normal breath sounds. No wheezing or rales.  Chest:     Chest wall: Tenderness present.  Abdominal:     General: There is no distension.     Palpations: Abdomen is soft.     Comments: No abdominal tenderness.  Skin:    General: Skin is warm and dry.  Neurological:     Mental Status: He is alert and oriented to person, place, and time.      ED Treatments / Results  Labs (all labs ordered are listed, but only abnormal results are displayed) Labs Reviewed  BASIC METABOLIC PANEL - Abnormal; Notable for the following components:      Result Value   Creatinine, Ser 1.57 (*)    GFR calc non Af Amer 54 (*)    All other components within normal limits  CBC - Abnormal; Notable for the following components:   RBC 4.12 (*)    Hemoglobin 12.8 (*)    HCT 38.1 (*)    All other components within normal limits  TROPONIN I (HIGH SENSITIVITY)  TROPONIN I (HIGH SENSITIVITY)    EKG None  Radiology Dg Chest 2 View  Result Date: 11/03/2018 CLINICAL DATA:  43 year old male with chest pain. EXAM: CHEST - 2 VIEW COMPARISON:  Chest radiograph dated 08/14/2018. FINDINGS: Right perihilar  density may represent vascular congestion. Developing infiltrate is not excluded. Clinical correlation is recommended. No focal consolidation, pleural effusion pneumothorax. Top-normal cardiac size. No acute osseous pathology. IMPRESSION: Right perihilar density may represent vascular confluence or congestion. Developing infiltrate is not excluded. Clinical correlation is recommended. Electronically Signed   By: Elgie CollardArash  Radparvar M.D.   On: 11/03/2018 23:59    Procedures Procedures (including critical care time)  Medications Ordered in ED Medications  sodium chloride flush (NS) 0.9 % injection 3 mL (has no administration in time range)     Initial Impression / Assessment and Plan / ED Course  I have reviewed the triage  vital signs and the nursing notes.  Pertinent labs & imaging results that were available during my care of the patient were reviewed by me and considered in my medical decision making (see chart for details).       Barry [name] is a 43 y.o. male who presents to ED for chest pain and shortness of breath which began a few days ago.  On exam, patient is afebrile, hemodynamically stable although mildly tachycardic. Chest wall is tender. Clear lung exam.  EKG without acute ischemic changes.  First and second troponin of 8.  Low risk heart score of 3.  Doubt cardiac etiology.  Given tachycardia and pleuritic chest pain, d-dimer was obtained and elevated.  Proceeded to CT angio which was negative for PE.  Evaluation does not show pathology that would require ongoing emergent intervention or inpatient treatment.  Patient does have a primary care doctor and agrees to call today to schedule follow-up for recheck.  Discussed reasons to return to the emergency department as well.  All questions  Final Clinical Impressions(s) / ED Diagnoses   Final diagnoses:  None    ED Discharge Orders    None       Mychaela Lennartz, Chase Picket, PA-C 11/04/18 1009    Virgina Norfolk, DO 11/04/18 1638

## 2018-11-04 NOTE — Discharge Instructions (Signed)
It was my pleasure taking care of you today!   Please take all of your antibiotics until finished!  Call your primary care doctor today to schedule a follow-up appointment.  Return to the emergency department for new or worsening symptoms, any additional concerns.

## 2018-11-04 NOTE — Telephone Encounter (Signed)
Pharmacy called related to Rx: zithromax needing medical diagnosis...EDCM reviewed the chart to find chest pain and shortness of breath were listed as diagnoses. No further EDCM needs identified.

## 2018-12-17 ENCOUNTER — Other Ambulatory Visit: Payer: Self-pay

## 2018-12-17 ENCOUNTER — Emergency Department (HOSPITAL_COMMUNITY): Payer: BC Managed Care – PPO

## 2018-12-17 ENCOUNTER — Emergency Department (HOSPITAL_COMMUNITY)
Admission: EM | Admit: 2018-12-17 | Discharge: 2018-12-17 | Disposition: A | Discharge status: Home / Self Care | Payer: BC Managed Care – PPO | Attending: Emergency Medicine | Admitting: Emergency Medicine

## 2018-12-17 ENCOUNTER — Encounter (HOSPITAL_COMMUNITY): Payer: Self-pay

## 2018-12-17 DIAGNOSIS — Z794 Long term (current) use of insulin: Secondary | ICD-10-CM | POA: Insufficient documentation

## 2018-12-17 DIAGNOSIS — E1140 Type 2 diabetes mellitus with diabetic neuropathy, unspecified: Secondary | ICD-10-CM | POA: Diagnosis not present

## 2018-12-17 DIAGNOSIS — R071 Chest pain on breathing: Secondary | ICD-10-CM | POA: Insufficient documentation

## 2018-12-17 DIAGNOSIS — M549 Dorsalgia, unspecified: Secondary | ICD-10-CM | POA: Diagnosis not present

## 2018-12-17 DIAGNOSIS — R0789 Other chest pain: Secondary | ICD-10-CM | POA: Insufficient documentation

## 2018-12-17 DIAGNOSIS — E039 Hypothyroidism, unspecified: Secondary | ICD-10-CM | POA: Insufficient documentation

## 2018-12-17 DIAGNOSIS — R079 Chest pain, unspecified: Secondary | ICD-10-CM | POA: Diagnosis not present

## 2018-12-17 DIAGNOSIS — Z79899 Other long term (current) drug therapy: Secondary | ICD-10-CM | POA: Diagnosis not present

## 2018-12-17 DIAGNOSIS — Z87891 Personal history of nicotine dependence: Secondary | ICD-10-CM | POA: Insufficient documentation

## 2018-12-17 DIAGNOSIS — M546 Pain in thoracic spine: Secondary | ICD-10-CM | POA: Insufficient documentation

## 2018-12-17 LAB — CBC
HCT: 38.6 % — ABNORMAL LOW (ref 39.0–52.0)
Hemoglobin: 12.3 g/dL — ABNORMAL LOW (ref 13.0–17.0)
MCH: 29.9 pg (ref 26.0–34.0)
MCHC: 31.9 g/dL (ref 30.0–36.0)
MCV: 93.9 fL (ref 80.0–100.0)
Platelets: 301 10*3/uL (ref 150–400)
RBC: 4.11 MIL/uL — ABNORMAL LOW (ref 4.22–5.81)
RDW: 14.8 % (ref 11.5–15.5)
WBC: 9.9 10*3/uL (ref 4.0–10.5)
nRBC: 0 % (ref 0.0–0.2)

## 2018-12-17 LAB — BASIC METABOLIC PANEL
Anion gap: 9 (ref 5–15)
BUN: 29 mg/dL — ABNORMAL HIGH (ref 6–20)
CO2: 23 mmol/L (ref 22–32)
Calcium: 9 mg/dL (ref 8.9–10.3)
Chloride: 105 mmol/L (ref 98–111)
Creatinine, Ser: 1.74 mg/dL — ABNORMAL HIGH (ref 0.61–1.24)
GFR calc Af Amer: 55 mL/min — ABNORMAL LOW (ref >60)
GFR calc non Af Amer: 48 mL/min — ABNORMAL LOW (ref >60)
Glucose, Bld: 193 mg/dL — ABNORMAL HIGH (ref 70–99)
Potassium: 4.6 mmol/L (ref 3.5–5.1)
Sodium: 137 mmol/L (ref 135–145)

## 2018-12-17 LAB — TROPONIN I (HIGH SENSITIVITY)
Troponin I (High Sensitivity): 5 ng/L (ref <18)
Troponin I (High Sensitivity): 5 ng/L (ref <18)

## 2018-12-17 IMAGING — CR DG CHEST 2V
2 series · 2 of 2 positions shown · non-contrast
Comparison: [DATE]

CLINICAL DATA: Chest pain

EXAM:
CHEST - 2 VIEW

[w chest pa]
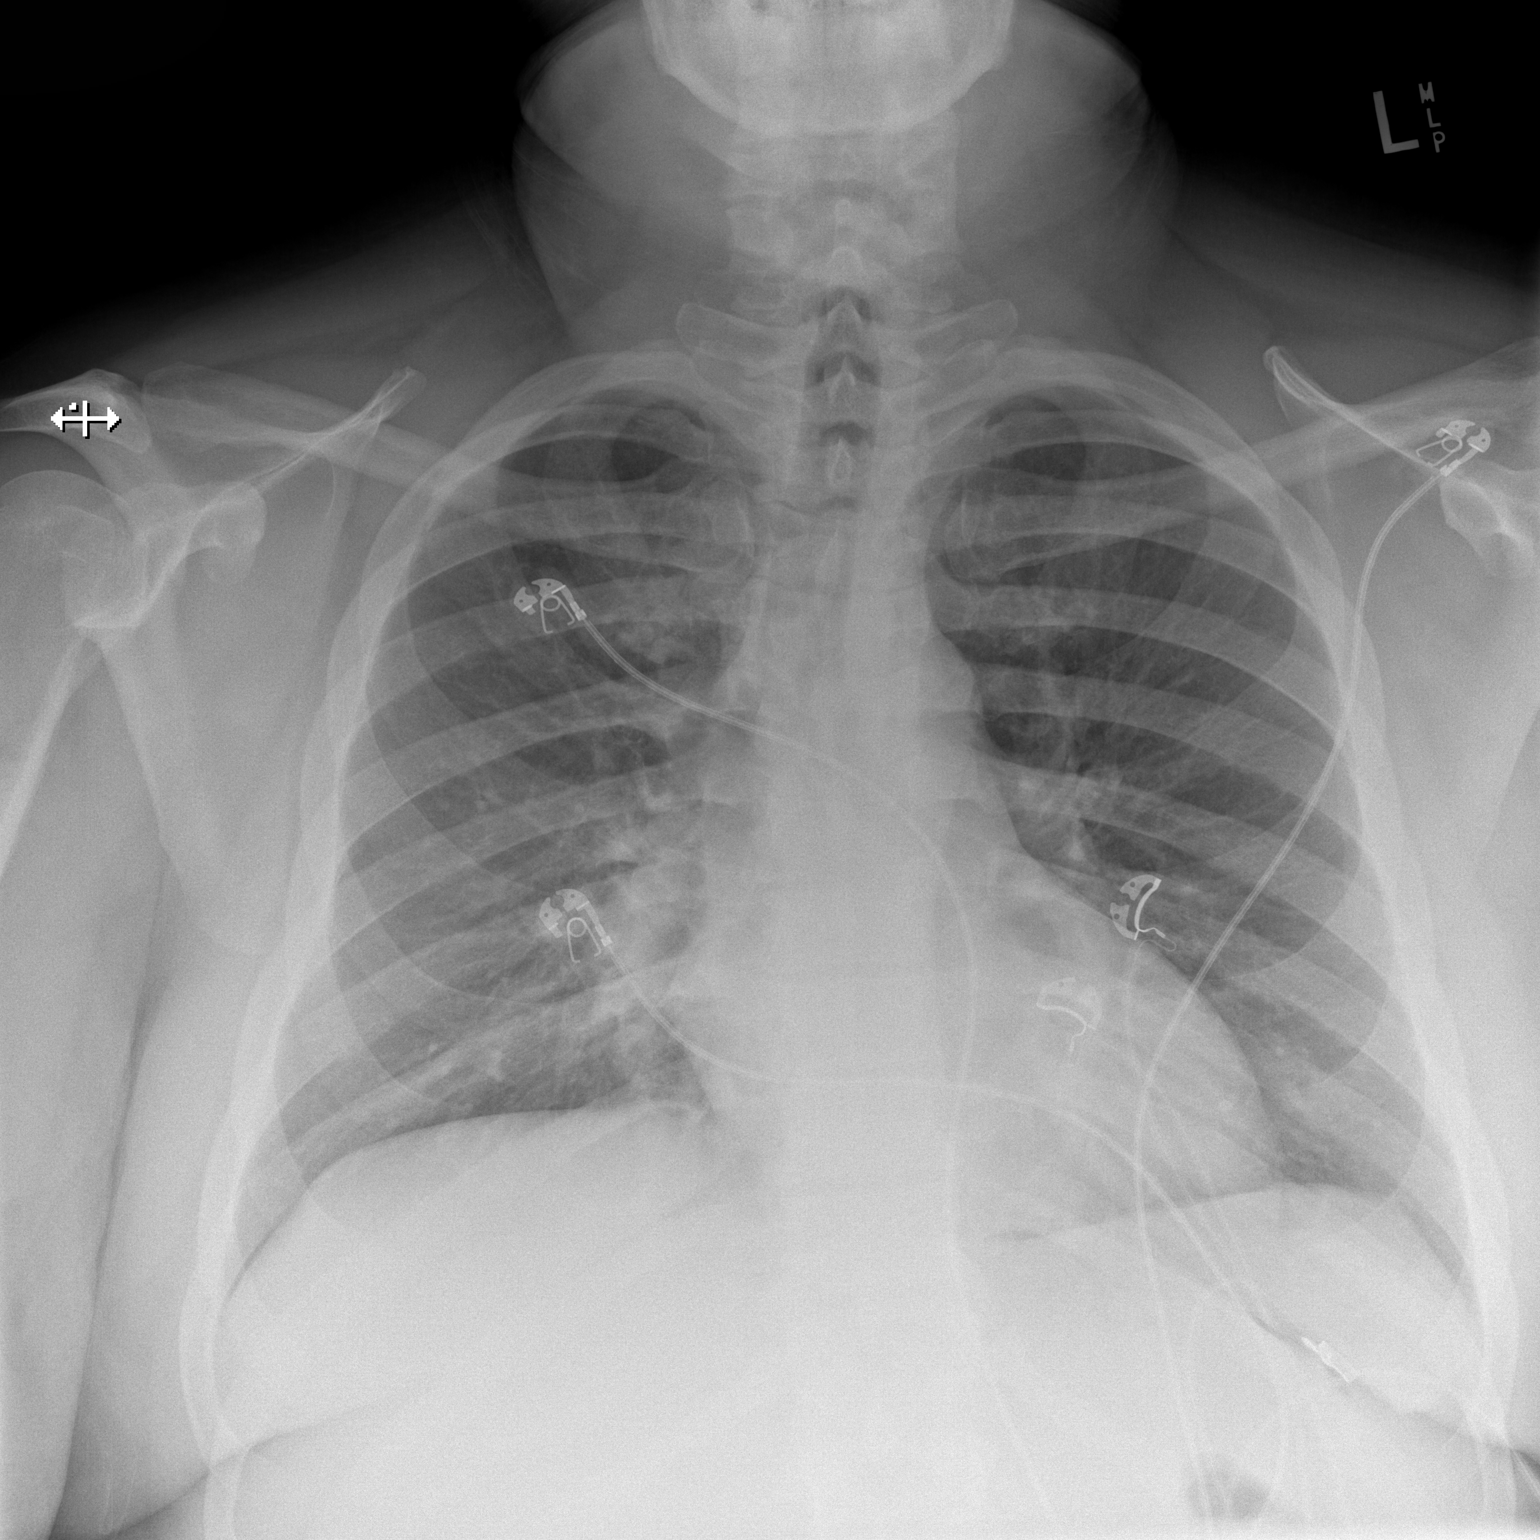

[w chest lat]
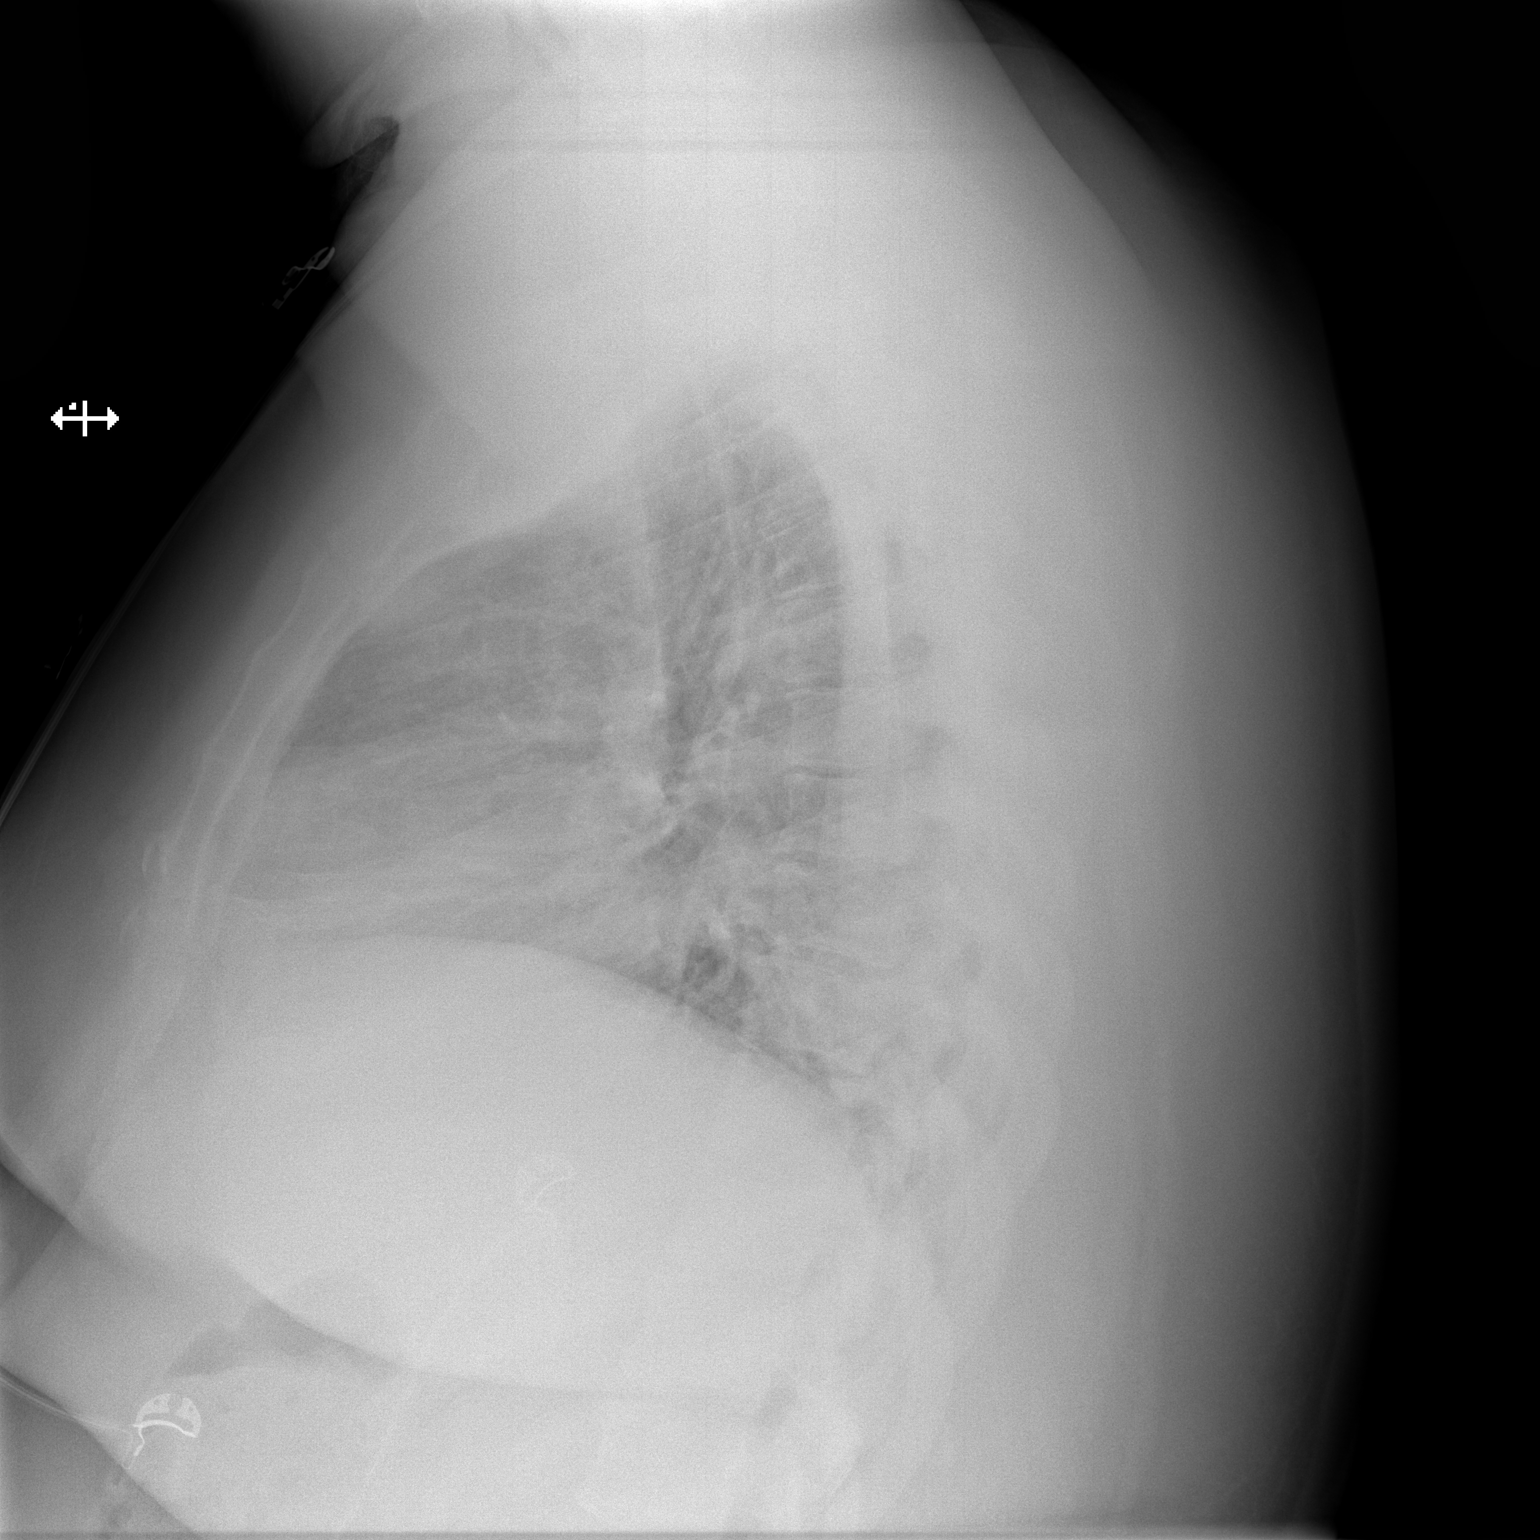

[2 of 2 positions shown; findings below may reference images not displayed]

FINDINGS: The heart size and mediastinal contours are within normal limits.
Persistent opacity within the medial aspect of the right lower lobe
suggestive of round atelectasis. No new focal airspace
consolidation. No pleural effusion or pneumothorax. The visualized
skeletal structures are unremarkable.
IMPRESSION: Persistent opacity within the medial aspect of the right lower lobe
suggestive of round atelectasis. No new focal airspace consolidation
or pleural effusion.

## 2018-12-17 MED ORDER — OXYCODONE-ACETAMINOPHEN 5-325 MG PO TABS
1.0000 | ORAL_TABLET | Freq: Once | ORAL | Status: AC
  Administered 2018-12-17: 20:00:00 1 via ORAL
  Filled 2018-12-17: qty 1

## 2018-12-17 MED ORDER — HYDROCODONE-ACETAMINOPHEN 5-325 MG PO TABS: 1.0000 | ORAL_TABLET | Freq: Four times a day (QID) | ORAL | 0 refills | Status: DC | PRN

## 2018-12-17 MED ORDER — SODIUM CHLORIDE 0.9% FLUSH: 3.0000 mL | Freq: Once | INTRAVENOUS | Status: DC

## 2018-12-17 NOTE — ED Triage Notes (Signed)
Pt presents with c/o back pain that shoots through to his chest. Pt denies any N/V/D.

## 2018-12-17 NOTE — Discharge Instructions (Addendum)
Please read instructions below. Follow up with your primary care provider next week. Treat your pain with prescribed medication as needed, as well as over-the-counter medications. You can continue taking the flexeril. Apply ice or heat to your areas of pain.   Return to the ER for new or worsening symptoms; including worsening chest pain, shortness of breath, pain that radiates to the arm or neck, pain or shortness of breath worsened with exertion.

## 2018-12-17 NOTE — ED Provider Notes (Signed)
[facility] DEPT Provider Note   CSN: [id number] Arrival date & time: 12/17/18  1459     History   Chief Complaint Chief Complaint  Patient presents with  . Chest Pain  . Back Pain    HPI Barry [name] is a 43 y.o. male with past medical history of diabetes, HLD, presenting to the emergency department with complaint of 3 days of left-sided upper back pain radiating around to his left lower chest.  He states his symptoms began while he was sleeping, thinks he may have slept wrong however have been worsening.  He has sharp pain that is worse with movement and palpation.  His pain is also worse with deep breathing.  He has had similar pain in the past, including his last ED evaluation in October 2020 where he had negative CTA of the chest and negative cardiac work-up.  He was ultimately discharged with outpatient follow-up and determined to have a low heart score of 3.  He states his symptoms are similar in location and quality.  He denies difficulty breathing, fever, nausea, diaphoresis.  His pain has been constant, not relieved by Flexeril.  No known injuries.  No cough or URI symptoms.  Denies new leg swelling.     The history is provided by the patient and medical records.    Past Medical History:  Diagnosis Date  . Carpal tunnel syndrome   . Diabetes mellitus without complication ([place])   . Hypercholesteremia   . Neuropathy   . Obesity   . Thyroid disease     Patient Active Problem List   Diagnosis Date Noted  . HYPOTHYROIDISM 05/31/2008  . DYSLIPIDEMIA 05/31/2008  . OBESITY, MORBID 05/31/2008  . MYOCARDIAL PERFUSION SCAN, WITH STRESS TEST, ABNORMAL 05/31/2008    Past Surgical History:  Procedure Laterality Date  . FRACTURE SURGERY    . INCISE AND DRAIN ABCESS    . TONSILLECTOMY          Home Medications    Prior to Admission medications   Medication Sig Start Date End Date Taking? Authorizing Provider  atorvastatin (LIPITOR) 80  MG tablet Take 80 mg by mouth daily.  11/21/17  Yes [provider]  HUMALOG MIX 75/25 (75-25) 100 UNIT/ML SUSP injection Inject 100 Units into the skin 2 (two) times a day. 06/07/18  Yes [provider]  JARDIANCE 25 MG TABS tablet Take 25 mg by mouth daily. 10/18/18  Yes [provider]  levothyroxine (SYNTHROID, LEVOTHROID) 75 MCG tablet Take 75 mcg by mouth at bedtime.    Yes [provider]  lisinopril (ZESTRIL) 10 MG tablet Take 10 mg by mouth daily. 09/22/18  Yes [provider]  meloxicam (MOBIC) 15 MG tablet Take 15 mg by mouth daily. 10/18/18  Yes [provider]  metFORMIN (GLUCOPHAGE) 500 MG tablet Take 1,000 mg by mouth 2 (two) times daily with a meal. 05/14/18  Yes [provider]  azithromycin (ZITHROMAX) 250 MG tablet Take 1 tablet (250 mg total) by mouth daily. Take first 2 tablets together, then 1 every day until finished. Patient not taking: Reported on 12/17/2018 11/04/18   Ward, Ozella Almond, PA-C  HYDROcodone-acetaminophen (NORCO/VICODIN) 5-325 MG tablet Take 1 tablet by mouth every 6 (six) hours as needed for severe pain. 12/17/18   , [place] N, PA-C  levofloxacin (LEVAQUIN) 500 MG tablet Take 1 tablet (500 mg total) by mouth daily. Patient not taking: Reported on 11/04/2018 07/01/18   Charlann Lange, PA-C    Family  History History reviewed. No pertinent family history.  Social History Social History   Tobacco Use  . Smoking status: Former Games developer  . Smokeless tobacco: Never Used  Substance Use Topics  . Alcohol use: Yes  . Drug use: No     Allergies   Patient has no known allergies.   Review of Systems Review of Systems  Constitutional: Negative for chills, diaphoresis and fever.  Respiratory: Negative for cough and shortness of breath.   Cardiovascular: Positive for chest pain. Negative for leg swelling.  Gastrointestinal: Negative for abdominal pain and nausea.  Musculoskeletal: Positive for  back pain.  All other systems reviewed and are negative.    Physical Exam Updated Vital Signs BP (!) 121/93 (BP Location: Right Arm)   Pulse 90   Temp 98.3 F (36.8 C) (Oral)   Resp 16   Ht 6\' 3"  (1.905 m)   SpO2 94%   BMI 43.12 kg/m   Physical Exam Vitals signs and nursing note reviewed.  Constitutional:      Appearance: He is well-developed. He is not ill-appearing.     Comments: Morbidly obese  HENT:     Head: Normocephalic and atraumatic.  Eyes:     Conjunctiva/sclera: Conjunctivae normal.  Neck:     Musculoskeletal: Normal range of motion and neck supple.  Cardiovascular:     Rate and Rhythm: Normal rate and regular rhythm.  Pulmonary:     Effort: Pulmonary effort is normal. No respiratory distress.     Breath sounds: Normal breath sounds.     Comments: No rash to the chest wall.  There is tenderness to the left lower chest wall below the breast wrapping around to the left thoracic back.  Patient reports this reproduces his pain. Abdominal:     General: Bowel sounds are normal.     Palpations: Abdomen is soft.     Tenderness: There is no abdominal tenderness. There is no guarding or rebound.  Skin:    General: Skin is warm.  Neurological:     Mental Status: He is alert.  Psychiatric:        Behavior: Behavior normal.      ED Treatments / Results  Labs (all labs ordered are listed, but only abnormal results are displayed) Labs Reviewed  BASIC METABOLIC PANEL - Abnormal; Notable for the following components:      Result Value   Glucose, Bld 193 (*)    BUN 29 (*)    Creatinine, Ser 1.74 (*)    GFR calc non Af Amer 48 (*)    GFR calc Af Amer 55 (*)    All other components within normal limits  CBC - Abnormal; Notable for the following components:   RBC 4.11 (*)    Hemoglobin 12.3 (*)    HCT 38.6 (*)    All other components within normal limits  TROPONIN I (HIGH SENSITIVITY)  TROPONIN I (HIGH SENSITIVITY)    EKG EKG Interpretation  Date/Time:   Friday December 17 2018 15:09:08 EST Ventricular Rate:  108 PR Interval:    QRS Duration: 98 QT Interval:  327 QTC Calculation: 439 R Axis:   1 Text Interpretation: Sinus tachycardia Consider left atrial enlargement similar to Oct 2020 Confirmed by Nov 2020 [phone]) on 12/17/2018 6:44:49 PM   Radiology Dg Chest 2 View  Result Date: 12/17/2018 CLINICAL DATA:  Chest pain EXAM: CHEST - 2 VIEW COMPARISON:  11/03/2018 FINDINGS: The heart size and mediastinal contours are within normal limits. Persistent opacity within the  medial aspect of the right lower lobe suggestive of round atelectasis. No new focal airspace consolidation. No pleural effusion or pneumothorax. The visualized skeletal structures are unremarkable. IMPRESSION: Persistent opacity within the medial aspect of the right lower lobe suggestive of round atelectasis. No new focal airspace consolidation or pleural effusion. Electronically Signed   By: Duanne GuessNicholas  Plundo M.D.   On: 12/17/2018 16:05    Procedures Procedures (including critical care time)  Medications Ordered in ED Medications  sodium chloride flush (NS) 0.9 % injection 3 mL (has no administration in time range)  oxyCODONE-acetaminophen (PERCOCET/ROXICET) 5-325 MG per tablet 1 tablet (has no administration in time range)     Initial Impression / Assessment and Plan / ED Course  I have reviewed the triage vital signs and the nursing notes.  Pertinent labs & imaging results that were available during my care of the patient were reviewed by me and considered in my medical decision making (see chart for details).        Patient presenting with recurrence of symptoms since last ED visit in October 2020 with left-sided lower chest wall pain and left thoracic back pain.  Pain is reproducible on exam and is worse with movement.  He denies difficulty breathing, nausea, diaphoresis.  He states he thinks he slept wrong on his side.  His work-up today is reassuring, no  leukocytosis or new electrolyte derangements.  Creatinine appears to be around patient's baseline.  Heart enzyme is within normal limits.  Chest x-ray is negative for acute findings.  EKG without ischemic changes.  Vital signs are stable.  Symptoms are atypical for ACS given their constant over the last 3 days and reproducible on exam.  Heart score of 3.  Unlikely dissection as patient had normal aorta on CT scan last month.  Had shared decision making with patient regarding advanced imaging of the chest given recent CTA of the chest for similar presentation.  Patient feels comfortable with conservative management including pain medication and close outpatient follow-up.  He is instructed to return the urgency department should he develop shortness of breath or worsening symptoms.  Discussed patient with Dr. Criss AlvineGoldston, who agrees with workup and care plan.  Discussed results, findings, treatment and follow up. Patient advised of return precautions. Patient verbalized understanding and agreed with plan.  [place] Controlled Substance reporting System queried   Final Clinical Impressions(s) / ED Diagnoses   Final diagnoses:  Chest wall pain  Acute left-sided thoracic back pain    ED Discharge Orders         Ordered    HYDROcodone-acetaminophen (NORCO/VICODIN) 5-325 MG tablet  Every 6 hours PRN     12/17/18 1948           , [place] N, PA-C 12/17/18 1949    Pricilla LovelessGoldston, Scott, MD 12/17/18 2342

## 2019-03-08 DIAGNOSIS — R21 Rash and other nonspecific skin eruption: Secondary | ICD-10-CM | POA: Diagnosis not present

## 2019-03-08 DIAGNOSIS — L0390 Cellulitis, unspecified: Secondary | ICD-10-CM | POA: Diagnosis not present

## 2019-03-15 DIAGNOSIS — E1140 Type 2 diabetes mellitus with diabetic neuropathy, unspecified: Secondary | ICD-10-CM | POA: Diagnosis not present

## 2019-03-15 DIAGNOSIS — Z79899 Other long term (current) drug therapy: Secondary | ICD-10-CM | POA: Diagnosis not present

## 2019-03-15 DIAGNOSIS — Z6841 Body Mass Index (BMI) 40.0 and over, adult: Secondary | ICD-10-CM | POA: Diagnosis not present

## 2019-03-15 DIAGNOSIS — M7989 Other specified soft tissue disorders: Secondary | ICD-10-CM | POA: Diagnosis not present

## 2019-03-15 DIAGNOSIS — R21 Rash and other nonspecific skin eruption: Secondary | ICD-10-CM | POA: Diagnosis not present

## 2019-03-15 DIAGNOSIS — L299 Pruritus, unspecified: Secondary | ICD-10-CM | POA: Diagnosis not present

## 2019-03-15 DIAGNOSIS — Z7984 Long term (current) use of oral hypoglycemic drugs: Secondary | ICD-10-CM | POA: Diagnosis not present

## 2019-03-15 DIAGNOSIS — R7982 Elevated C-reactive protein (CRP): Secondary | ICD-10-CM | POA: Diagnosis not present

## 2019-04-04 DIAGNOSIS — Z79899 Other long term (current) drug therapy: Secondary | ICD-10-CM | POA: Diagnosis not present

## 2019-04-04 DIAGNOSIS — M67871 Other specified disorders of synovium, right ankle and foot: Secondary | ICD-10-CM | POA: Diagnosis not present

## 2019-04-04 DIAGNOSIS — R600 Localized edema: Secondary | ICD-10-CM | POA: Diagnosis not present

## 2019-04-04 DIAGNOSIS — M109 Gout, unspecified: Secondary | ICD-10-CM | POA: Diagnosis not present

## 2019-04-04 DIAGNOSIS — E785 Hyperlipidemia, unspecified: Secondary | ICD-10-CM | POA: Diagnosis not present

## 2019-04-04 DIAGNOSIS — Z6841 Body Mass Index (BMI) 40.0 and over, adult: Secondary | ICD-10-CM | POA: Diagnosis not present

## 2019-04-04 DIAGNOSIS — S92411A Displaced fracture of proximal phalanx of right great toe, initial encounter for closed fracture: Secondary | ICD-10-CM | POA: Diagnosis not present

## 2019-04-04 DIAGNOSIS — L02611 Cutaneous abscess of right foot: Secondary | ICD-10-CM | POA: Diagnosis not present

## 2019-04-04 DIAGNOSIS — E1165 Type 2 diabetes mellitus with hyperglycemia: Secondary | ICD-10-CM | POA: Diagnosis not present

## 2019-04-04 DIAGNOSIS — M1990 Unspecified osteoarthritis, unspecified site: Secondary | ICD-10-CM | POA: Diagnosis not present

## 2019-04-04 DIAGNOSIS — E119 Type 2 diabetes mellitus without complications: Secondary | ICD-10-CM | POA: Diagnosis not present

## 2019-04-04 DIAGNOSIS — S92401A Displaced unspecified fracture of right great toe, initial encounter for closed fracture: Secondary | ICD-10-CM | POA: Diagnosis not present

## 2019-04-04 DIAGNOSIS — M25462 Effusion, left knee: Secondary | ICD-10-CM | POA: Diagnosis not present

## 2019-04-04 DIAGNOSIS — L03115 Cellulitis of right lower limb: Secondary | ICD-10-CM | POA: Diagnosis not present

## 2019-04-04 DIAGNOSIS — M79674 Pain in right toe(s): Secondary | ICD-10-CM | POA: Diagnosis not present

## 2019-04-04 DIAGNOSIS — L089 Local infection of the skin and subcutaneous tissue, unspecified: Secondary | ICD-10-CM | POA: Diagnosis not present

## 2019-04-04 DIAGNOSIS — B9689 Other specified bacterial agents as the cause of diseases classified elsewhere: Secondary | ICD-10-CM | POA: Diagnosis not present

## 2019-04-04 DIAGNOSIS — I10 Essential (primary) hypertension: Secondary | ICD-10-CM | POA: Diagnosis not present

## 2019-04-04 DIAGNOSIS — M1712 Unilateral primary osteoarthritis, left knee: Secondary | ICD-10-CM | POA: Diagnosis not present

## 2019-04-04 DIAGNOSIS — E039 Hypothyroidism, unspecified: Secondary | ICD-10-CM | POA: Diagnosis not present

## 2019-04-04 DIAGNOSIS — Z7982 Long term (current) use of aspirin: Secondary | ICD-10-CM | POA: Diagnosis not present

## 2019-04-04 DIAGNOSIS — Z794 Long term (current) use of insulin: Secondary | ICD-10-CM | POA: Diagnosis not present

## 2019-04-05 DIAGNOSIS — L089 Local infection of the skin and subcutaneous tissue, unspecified: Secondary | ICD-10-CM | POA: Diagnosis not present

## 2019-04-05 DIAGNOSIS — S92401A Displaced unspecified fracture of right great toe, initial encounter for closed fracture: Secondary | ICD-10-CM | POA: Diagnosis not present

## 2019-04-05 DIAGNOSIS — M79674 Pain in right toe(s): Secondary | ICD-10-CM | POA: Diagnosis not present

## 2019-04-05 DIAGNOSIS — E119 Type 2 diabetes mellitus without complications: Secondary | ICD-10-CM | POA: Diagnosis not present

## 2019-04-05 DIAGNOSIS — I10 Essential (primary) hypertension: Secondary | ICD-10-CM | POA: Diagnosis not present

## 2019-04-05 DIAGNOSIS — L02611 Cutaneous abscess of right foot: Secondary | ICD-10-CM | POA: Diagnosis not present

## 2019-04-05 DIAGNOSIS — M67871 Other specified disorders of synovium, right ankle and foot: Secondary | ICD-10-CM | POA: Diagnosis not present

## 2019-04-06 DIAGNOSIS — I10 Essential (primary) hypertension: Secondary | ICD-10-CM | POA: Diagnosis not present

## 2019-04-06 DIAGNOSIS — M25462 Effusion, left knee: Secondary | ICD-10-CM | POA: Diagnosis not present

## 2019-04-06 DIAGNOSIS — L03115 Cellulitis of right lower limb: Secondary | ICD-10-CM | POA: Diagnosis not present

## 2019-04-06 DIAGNOSIS — M79674 Pain in right toe(s): Secondary | ICD-10-CM | POA: Diagnosis not present

## 2019-04-06 DIAGNOSIS — M1712 Unilateral primary osteoarthritis, left knee: Secondary | ICD-10-CM | POA: Diagnosis not present

## 2019-04-06 DIAGNOSIS — E119 Type 2 diabetes mellitus without complications: Secondary | ICD-10-CM | POA: Diagnosis not present

## 2019-04-07 DIAGNOSIS — M79674 Pain in right toe(s): Secondary | ICD-10-CM | POA: Diagnosis not present

## 2019-04-07 DIAGNOSIS — I10 Essential (primary) hypertension: Secondary | ICD-10-CM | POA: Diagnosis not present

## 2019-04-07 DIAGNOSIS — E119 Type 2 diabetes mellitus without complications: Secondary | ICD-10-CM | POA: Diagnosis not present

## 2019-04-08 DIAGNOSIS — M79674 Pain in right toe(s): Secondary | ICD-10-CM | POA: Diagnosis not present

## 2019-04-08 DIAGNOSIS — E119 Type 2 diabetes mellitus without complications: Secondary | ICD-10-CM | POA: Diagnosis not present

## 2019-04-08 DIAGNOSIS — I10 Essential (primary) hypertension: Secondary | ICD-10-CM | POA: Diagnosis not present

## 2019-04-11 ENCOUNTER — Other Ambulatory Visit: Payer: Self-pay

## 2019-04-11 ENCOUNTER — Ambulatory Visit: Payer: Self-pay

## 2019-04-11 ENCOUNTER — Ambulatory Visit (INDEPENDENT_AMBULATORY_CARE_PROVIDER_SITE_OTHER): Payer: BC Managed Care – PPO

## 2019-04-11 ENCOUNTER — Other Ambulatory Visit: Payer: Self-pay | Admitting: Podiatry

## 2019-04-11 ENCOUNTER — Ambulatory Visit: Payer: BC Managed Care – PPO | Admitting: Podiatry

## 2019-04-11 DIAGNOSIS — M79671 Pain in right foot: Secondary | ICD-10-CM

## 2019-04-11 DIAGNOSIS — M79672 Pain in left foot: Secondary | ICD-10-CM

## 2019-04-11 DIAGNOSIS — S92405D Nondisplaced unspecified fracture of left great toe, subsequent encounter for fracture with routine healing: Secondary | ICD-10-CM | POA: Diagnosis not present

## 2019-04-11 DIAGNOSIS — M7752 Other enthesopathy of left foot: Secondary | ICD-10-CM

## 2019-04-11 NOTE — Patient Instructions (Signed)
Pre-Operative Instructions  Congratulations, you have decided to take an important step towards improving your quality of life.  You can be assured that the doctors and staff at [facility] will be with you every step of the way.  Here are some important things you should know:  1. Plan to be at the surgery center/hospital at least 1 (one) hour prior to your scheduled time, unless otherwise directed by the surgical center/hospital staff.  You must have a responsible adult accompany you, remain during the surgery and drive you home.  Make sure you have directions to the surgical center/hospital to ensure you arrive on time. 2. If you are having surgery at [facility] or [facility] hospitals, you will need a copy of your medical history and physical form from your family physician within one month prior to the date of surgery. We will give you a form for your primary physician to complete.  3. We make every effort to accommodate the date you request for surgery.  However, there are times where surgery dates or times have to be moved.  We will contact you as soon as possible if a change in schedule is required.   4. No aspirin/ibuprofen for one week before surgery.  If you are on aspirin, any non-steroidal anti-inflammatory medications (Mobic, Aleve, Ibuprofen) should not be taken seven (7) days prior to your surgery.  You make take Tylenol for pain prior to surgery.  5. Medications - If you are taking daily heart and blood pressure medications, seizure, reflux, allergy, asthma, anxiety, pain or diabetes medications, make sure you notify the surgery center/hospital before the day of surgery so they can tell you which medications you should take or avoid the day of surgery. 6. No food or drink after midnight the night before surgery unless directed otherwise by surgical center/hospital staff. 7. No alcoholic beverages 24-hours prior to surgery.  No smoking 24-hours prior or 24-hours after  surgery. 8. Wear loose pants or shorts. They should be loose enough to fit over bandages, boots, and casts. 9. Don't wear slip-on shoes. Sneakers are preferred. 10. Bring your boot with you to the surgery center/hospital.  Also bring crutches or a walker if your physician has prescribed it for you.  If you do not have this equipment, it will be provided for you after surgery. 11. If you have not been contacted by the surgery center/hospital by the day before your surgery, call to confirm the date and time of your surgery. 12. Leave-time from work may vary depending on the type of surgery you have.  Appropriate arrangements should be made prior to surgery with your employer. 13. Prescriptions will be provided immediately following surgery by your doctor.  Fill these as soon as possible after surgery and take the medication as directed. Pain medications will not be refilled on weekends and must be approved by the doctor. 14. Remove nail polish on the operative foot and avoid getting pedicures prior to surgery. 15. Wash the night before surgery.  The night before surgery wash the foot and leg well with water and the antibacterial soap provided. Be sure to pay special attention to beneath the toenails and in between the toes.  Wash for at least three (3) minutes. Rinse thoroughly with water and dry well with a towel.  Perform this wash unless told not to do so by your physician.  Enclosed: 1 Ice pack (please put in freezer the night before surgery)   1 Hibiclens skin cleaner     Pre-op instructions  If you have any questions regarding the instructions, please do not hesitate to call our office.  [place]: [address], [place], [place] [address] -- [phone]  [place]: [address] [place], [place] [address] -- [phone]  [place]: [address], [place], [place] [address] -- [phone]   Website: [email or web address] 

## 2019-04-12 ENCOUNTER — Telehealth: Payer: Self-pay

## 2019-04-12 ENCOUNTER — Ambulatory Visit: Payer: Self-pay | Admitting: Podiatry

## 2019-04-12 NOTE — Telephone Encounter (Signed)
DOS 04/14/19  ORIF PHALANX GREAT TOE RT - 28505 PARTIAL EXCISION PHALANX - 11951 I & D BELOW FASCIA - 20813  BCBS EFFECTIVE DATE - 01/21/19    In-Network   Max Per Benefit Period Year-to-Date Remaining     CoInsurance         Deductible $1,000.00      Out-Of-Pocket 3 $3,500.00 $2,424.46  Copay Not Applicable Coinsurance 15% Authorization Required No

## 2019-04-13 ENCOUNTER — Other Ambulatory Visit (HOSPITAL_COMMUNITY)
Admission: RE | Admit: 2019-04-13 | Discharge: 2019-04-13 | Disposition: A | Discharge status: Home / Self Care | Payer: BC Managed Care – PPO | Source: Ambulatory Visit | Attending: Podiatry | Admitting: Podiatry

## 2019-04-13 ENCOUNTER — Encounter (HOSPITAL_COMMUNITY): Payer: Self-pay | Admitting: Anesthesiology

## 2019-04-13 ENCOUNTER — Telehealth: Payer: Self-pay | Admitting: *Deleted

## 2019-04-13 ENCOUNTER — Encounter (HOSPITAL_COMMUNITY): Payer: Self-pay

## 2019-04-13 ENCOUNTER — Encounter (HOSPITAL_COMMUNITY): Payer: Self-pay | Admitting: Physician Assistant

## 2019-04-13 ENCOUNTER — Telehealth: Payer: Self-pay | Admitting: Podiatry

## 2019-04-13 ENCOUNTER — Encounter (HOSPITAL_COMMUNITY)
Admission: RE | Admit: 2019-04-13 | Discharge: 2019-04-13 | Disposition: A | Discharge status: Home / Self Care | Payer: BC Managed Care – PPO | Source: Ambulatory Visit | Attending: Podiatry | Admitting: Podiatry

## 2019-04-13 ENCOUNTER — Other Ambulatory Visit: Payer: Self-pay

## 2019-04-13 DIAGNOSIS — Z01812 Encounter for preprocedural laboratory examination: Secondary | ICD-10-CM | POA: Diagnosis not present

## 2019-04-13 DIAGNOSIS — Z20822 Contact with and (suspected) exposure to covid-19: Secondary | ICD-10-CM | POA: Diagnosis not present

## 2019-04-13 DIAGNOSIS — Z794 Long term (current) use of insulin: Secondary | ICD-10-CM | POA: Diagnosis not present

## 2019-04-13 DIAGNOSIS — E1142 Type 2 diabetes mellitus with diabetic polyneuropathy: Secondary | ICD-10-CM | POA: Diagnosis not present

## 2019-04-13 DIAGNOSIS — E113292 Type 2 diabetes mellitus with mild nonproliferative diabetic retinopathy without macular edema, left eye: Secondary | ICD-10-CM | POA: Diagnosis not present

## 2019-04-13 DIAGNOSIS — E1129 Type 2 diabetes mellitus with other diabetic kidney complication: Secondary | ICD-10-CM | POA: Diagnosis not present

## 2019-04-13 HISTORY — DX: Hypothyroidism, unspecified: E03.9

## 2019-04-13 LAB — BASIC METABOLIC PANEL
Anion gap: 7 (ref 5–15)
BUN: 61 mg/dL — ABNORMAL HIGH (ref 6–20)
CO2: 22 mmol/L (ref 22–32)
Calcium: 9.6 mg/dL (ref 8.9–10.3)
Chloride: 107 mmol/L (ref 98–111)
Creatinine, Ser: 1.63 mg/dL — ABNORMAL HIGH (ref 0.61–1.24)
GFR calc Af Amer: 60 mL/min — ABNORMAL LOW (ref >60)
GFR calc non Af Amer: 52 mL/min — ABNORMAL LOW (ref >60)
Glucose, Bld: 137 mg/dL — ABNORMAL HIGH (ref 70–99)
Potassium: 6.4 mmol/L (ref 3.5–5.1)
Sodium: 136 mmol/L (ref 135–145)

## 2019-04-13 LAB — CBC
HCT: 39.6 % (ref 39.0–52.0)
Hemoglobin: 12.5 g/dL — ABNORMAL LOW (ref 13.0–17.0)
MCH: 30.1 pg (ref 26.0–34.0)
MCHC: 31.6 g/dL (ref 30.0–36.0)
MCV: 95.4 fL (ref 80.0–100.0)
Platelets: 576 10*3/uL — ABNORMAL HIGH (ref 150–400)
RBC: 4.15 MIL/uL — ABNORMAL LOW (ref 4.22–5.81)
RDW: 14.8 % (ref 11.5–15.5)
WBC: 16 10*3/uL — ABNORMAL HIGH (ref 4.0–10.5)
nRBC: 0 % (ref 0.0–0.2)

## 2019-04-13 LAB — HEMOGLOBIN A1C
Hgb A1c MFr Bld: 8.5 % — ABNORMAL HIGH (ref 4.8–5.6)
Mean Plasma Glucose: 197.25 mg/dL

## 2019-04-13 LAB — GLUCOSE, CAPILLARY: Glucose-Capillary: 138 mg/dL — ABNORMAL HIGH (ref 70–99)

## 2019-04-13 LAB — SARS CORONAVIRUS 2 (TAT 6-24 HRS): SARS Coronavirus 2: NEGATIVE

## 2019-04-13 NOTE — Progress Notes (Signed)
Spoke to patient to advise that Dr. Samuella [name] has been trying to reach him to reschedule his 04-14-19 surgery due to  elevated potassium results.Elevated potassium needs to be addressed prior to surgery procedure.   Per Barry Cipro, PA, (Anesthesia) pt does not currently have a PCP, as a result, patient needs to seek treatment at an Urgent Care that provides primary care services.  Writer attempted to give pt's the name of two urgent care facility in [place] that provides primary care services, however, pt stated that since he has an appointment to establish primary care with Dr Barry [name] in a couple of weeks, he will contact his office to see if he can be scheduled to be seen in the morning. Pt asymptomatic.     Barry Cipro, PA advised of the above, and will contact Dr. Kandice [name] office to let them know the above.

## 2019-04-13 NOTE — Progress Notes (Signed)
Anesthesia Chart Review   Case: [id number] Date/Time: 04/14/19 0700   Procedures:      Open Reduction vs Excision of Fracture Fragment Right great toe, with pinning. (Right Toe)     Debridement and Irrigation with possible placement of antibiotic beads (Right )   Anesthesia type: Monitor Anesthesia Care   Pre-op diagnosis: Fracture Right Great Toe, Soft Tissue Infection Right Great Toe   Location: [facility] ROOM 09 / [facility]   Surgeons: Park Liter, DPM      DISCUSSION:43 y.o. never smoker with h/o DM II, hypothryoidism, right great toe fracture, soft tissue infection of right toe scheduled for above procedure 04/14/2019 with Ventura Sellers, DPM.    Critical lab, Potassium 6.4.  I have informed Dr. Samuella Cota.  Dr. Kandice Hams office has attempted to call the pt several times today without success. I have also been unable to reach the patient.  I spoke with PCP on file who states he is no longer a patient there, has not been seen since 2016.  Dr. Samuella Cota states his office will continue to try to reach the patient and will advise him to be seen in UC/Family medicine vs the ED.  Case will be postponed at this time until he can be evaluated.  VS: There were no vitals taken for this visit.  PROVIDERS: Charlott Rakes, MD is PCP    LABS: Critical lab value, Potassium 6.4 (all labs ordered are listed, but only abnormal results are displayed)  Labs Reviewed - No data to display   IMAGES:   EKG:   CV:  Past Medical History:  Diagnosis Date  . Carpal tunnel syndrome   . Diabetes mellitus without complication ([facility])   . Hypercholesteremia   . Hypothyroidism   . Neuropathy   . Obesity   . Thyroid disease     Past Surgical History:  Procedure Laterality Date  . FRACTURE SURGERY    . INCISE AND DRAIN ABCESS    . TONSILLECTOMY      MEDICATIONS: No current facility-administered medications for this encounter.   Marland Kitchen amoxicillin (AMOXIL) 500 MG capsule  . atorvastatin (LIPITOR) 80 MG tablet  .  B Complex Vitamins (B COMPLEX PO)  . cholecalciferol (VITAMIN D3) 25 MCG (1000 UNIT) tablet  . fluticasone (FLONASE) 50 MCG/ACT nasal spray  . HUMALOG MIX 75/25 (75-25) 100 UNIT/ML SUSP injection  . JARDIANCE 25 MG TABS tablet  . levothyroxine (SYNTHROID, LEVOTHROID) 75 MCG tablet  . lisinopril-hydrochlorothiazide (ZESTORETIC) 10-12.5 MG tablet  . metFORMIN (GLUCOPHAGE) 500 MG tablet  . miconazole (MICOTIN) 2 % cream  . Multiple Vitamin (MULTIVITAMIN WITH MINERALS) TABS tablet  . Multiple Vitamins-Minerals (HAIR SKIN NAILS PO)  . Multiple Vitamins-Minerals (PRESERVISION AREDS PO)  . oxyCODONE (OXY IR/ROXICODONE) 5 MG immediate release tablet  . azithromycin (ZITHROMAX) 250 MG tablet  . HYDROcodone-acetaminophen (NORCO/VICODIN) 5-325 MG tablet  . levofloxacin (LEVAQUIN) 500 MG tablet    Janey Genta [facility] Pre-Surgical Testing [phone] 04/13/19  3:45 PM

## 2019-04-13 NOTE — Progress Notes (Signed)
Spoke with t. mullins in lab and made aware potassium is 6.4, verna washinton rn aware of critical lab.

## 2019-04-13 NOTE — Patient Instructions (Addendum)
DUE TO COVID-19 ONLY ONE VISITOR IS ALLOWED TO COME WITH YOU AND STAY IN THE WAITING ROOM ONLY DURING PRE OP AND PROCEDURE DAY OF SURGERY. THE 1 VISITOR MAY VISIT WITH YOU AFTER SURGERY IN YOUR PRIVATE ROOM DURING VISITING HOURS ONLY!  YOU NEED TO HAVE A COVID 19 TEST ON 04-13-19 @ 11:30 AM, THIS TEST MUST BE DONE BEFORE SURGERY, COME  [address], [place] , [address].  [facility]) ONCE YOUR COVID TEST IS COMPLETED, PLEASE BEGIN THE QUARANTINE INSTRUCTIONS AS OUTLINED IN YOUR HANDOUT.                Barry [name]  04/13/2019   Your procedure is scheduled on: 04-14-19   Report to [facility] Main  Entrance    Report to Short Stay at 5:30 AM     Call this number if you have problems the morning of surgery [phone]    Remember: NO SOLID FOOD AFTER MIDNIGHT THE NIGHT PRIOR TO SURGERY. NOTHING BY MOUTH EXCEPT CLEAR LIQUIDS UNTIL 4:15 AM . PLEASE FINISH ENSURE G2 PER SURGEON ORDER  WHICH NEEDS TO BE COMPLETED AT 4:15 AM.   CLEAR LIQUID DIET   Foods Allowed                                                                     Foods Excluded  Coffee and tea, regular and decaf                             liquids that you cannot  Plain Jell-O any favor except red or purple                                           see through such as: Fruit ices (not with fruit pulp)                                     milk, soups, orange juice  Iced Popsicles                                    All solid food Carbonated beverages, regular and diet                                    Cranberry, grape and apple juices Sports drinks like Gatorade Lightly seasoned clear broth or consume(fat free) Sugar, honey syrup   _____________________________________________________________________      Take these medicines the morning of surgery with A SIP OF WATER: Atorvastatin (Lipitor), Levothyroxine (Synthroid), Oxycodone (Oxy IR),prn  BRUSH YOUR TEETH MORNING OF SURGERY AND  RINSE YOUR MOUTH OUT, NO CHEWING GUM CANDY OR MINTS.    DO NOT TAKE ANY DIABETIC MEDICATIONS DAY OF YOUR SURGERY  You may not have any metal on your body including hair pins and              piercings     Do not wear jewelry, cologne, lotions, powders or deodorant              Men may shave face and neck.   Do not bring valuables to the hospital. [facility] IS NOT             RESPONSIBLE   FOR VALUABLES.  Contacts, dentures or bridgework may not be worn into surgery.     Patients discharged the day of surgery will not be allowed to drive home. IF YOU ARE HAVING SURGERY AND GOING HOME THE SAME DAY, YOU MUST HAVE AN ADULT TO DRIVE YOU HOME AND BE WITH YOU FOR 24 HOURS. YOU MAY GO HOME BY TAXI OR UBER OR ORTHERWISE, BUT AN ADULT MUST ACCOMPANY YOU HOME AND STAY WITH YOU FOR 24 HOURS.  Name and phone number of your driver: Barry [name] [phone]  Special Instructions: N/A              Please read over the following fact sheets you were given: _____________________________________________________________________             How to Manage Your Diabetes Before and After Surgery  Why is it important to control my blood sugar before and after surgery? . Improving blood sugar levels before and after surgery helps healing and can limit problems. . A way of improving blood sugar control is eating a healthy diet by: o  Eating less sugar and carbohydrates o  Increasing activity/exercise o  Talking with your doctor about reaching your blood sugar goals . High blood sugars (greater than 180 mg/dL) can raise your risk of infections and slow your recovery, so you will need to focus on controlling your diabetes during the weeks before surgery. . Make sure that the doctor who takes care of your diabetes knows about your planned surgery including the date and location.  How do I manage my blood sugar before surgery? . Check your blood sugar at least 4 times a  day, starting 2 days before surgery, to make sure that the level is not too high or low. o Check your blood sugar the morning of your surgery when you wake up and every 2 hours until you get to the Short Stay unit. . If your blood sugar is less than 70 mg/dL, you will need to treat for low blood sugar: o Do not take insulin. o Treat a low blood sugar (less than 70 mg/dL) with  cup of clear juice (cranberry or apple), 4 glucose tablets, OR glucose gel. o Recheck blood sugar in 15 minutes after treatment (to make sure it is greater than 70 mg/dL). If your blood sugar is not greater than 70 mg/dL on recheck, call [phone] for further instructions. . Report your blood sugar to the short stay nurse when you get to Short Stay.  . If you are admitted to the hospital after surgery: o Your blood sugar will be checked by the staff and you will probably be given insulin after surgery (instead of oral diabetes medicines) to make sure you have good blood sugar levels. o The goal for blood sugar control after surgery is 80-180 mg/dL.   WHAT DO I DO ABOUT MY DIABETES MEDICATION?  Marland Kitchen Do not take oral diabetes medicines (pills) the morning of surgery.  . THE DAY BEFORE SURGERY, take  your usual prescribe Metformin, and only 50% of your 75/25 Humalog units of insulin. DONOT TAKE YOUR JARDIANCE         Reviewed and Endorsed by [facility] Patient Education Committee, August 2015  [facility] - Preparing for Surgery Before surgery, you can play an important role.  Because skin is not sterile, your skin needs to be as free of germs as possible.  You can reduce the number of germs on your skin by washing with CHG (chlorahexidine gluconate) soap before surgery.  CHG is an antiseptic cleaner which kills germs and bonds with the skin to continue killing germs even after washing. Please DO NOT use if you have an allergy to CHG or antibacterial soaps.  If your skin becomes reddened/irritated stop using the CHG and  inform your nurse when you arrive at Short Stay. Do not shave (including legs and underarms) for at least 48 hours prior to the first CHG shower.  You may shave your face/neck. Please follow these instructions carefully:  1.  Shower with CHG Soap the night before surgery and the  morning of Surgery.  2.  If you choose to wash your hair, wash your hair first as usual with your  normal  shampoo.  3.  After you shampoo, rinse your hair and body thoroughly to remove the  shampoo.                           4.  Use CHG as you would any other liquid soap.  You can apply chg directly  to the skin and wash                       Gently with a scrungie or clean washcloth.  5.  Apply the CHG Soap to your body ONLY FROM THE NECK DOWN.   Do not use on face/ open                           Wound or open sores. Avoid contact with eyes, ears mouth and genitals (private parts).                       Wash face,  Genitals (private parts) with your normal soap.             6.  Wash thoroughly, paying special attention to the area where your surgery  will be performed.  7.  Thoroughly rinse your body with warm water from the neck down.  8.  DO NOT shower/wash with your normal soap after using and rinsing off  the CHG Soap.                9.  Pat yourself dry with a clean towel.            10.  Wear clean pajamas.            11.  Place clean sheets on your bed the night of your first shower and do not  sleep with pets. Day of Surgery : Do not apply any lotions/deodorants the morning of surgery.  Please wear clean clothes to the hospital/surgery center.  FAILURE TO FOLLOW THESE INSTRUCTIONS MAY RESULT IN THE CANCELLATION OF YOUR SURGERY PATIENT SIGNATURE_________________________________  NURSE SIGNATURE__________________________________  ________________________________________________________________________

## 2019-04-13 NOTE — Telephone Encounter (Signed)
Barry [name] Pre-Admit - [facility] states they have received a critical lab on pt - Potassium is 6.4, they have been unable to contact pt's PCP at this time and Dr. Samuella Cota should know pt's surgery will be cancelled if K+is not brought in normal range.

## 2019-04-13 NOTE — Progress Notes (Signed)
PCP - None on file Cardiologist -   Chest x-ray - 12-17-18 EKG - 12-20-18 Stress Test -  ECHO -  Cardiac Cath -   Sleep Study -  CPAP -   Fasting Blood Sugar -  Checks Blood Sugar _____ times a day  Blood Thinner Instructions: Aspirin Instructions: Last Dose:  Anesthesia review:   Patient denies shortness of breath, fever, cough and chest pain at PAT appointment   Patient verbalized understanding of instructions that were given to them at the PAT appointment. Patient was also instructed that they will need to review over the PAT instructions again at home before surgery.

## 2019-04-13 NOTE — Telephone Encounter (Signed)
I have tried calling the patient three times to discuss his lab results. His potassium levels are unsafe for surgery. We will have to cancel his procedure.

## 2019-04-14 ENCOUNTER — Encounter (HOSPITAL_COMMUNITY): Admission: RE | Payer: Self-pay | Source: Home / Self Care

## 2019-04-14 ENCOUNTER — Ambulatory Visit (HOSPITAL_COMMUNITY): Admission: RE | Admit: 2019-04-14 | Payer: BC Managed Care – PPO | Source: Home / Self Care | Admitting: Podiatry

## 2019-04-14 SURGERY — OPEN REDUCTION INTERNAL FIXATION (ORIF) METATARSAL (TOE) FRACTURE
Anesthesia: Monitor Anesthesia Care | Site: Toe | Laterality: Right

## 2019-04-18 ENCOUNTER — Other Ambulatory Visit (HOSPITAL_COMMUNITY)
Admission: RE | Admit: 2019-04-18 | Discharge: 2019-04-18 | Disposition: A | Discharge status: Home / Self Care | Payer: BC Managed Care – PPO | Source: Ambulatory Visit | Attending: Podiatry | Admitting: Podiatry

## 2019-04-18 ENCOUNTER — Other Ambulatory Visit: Payer: Self-pay | Admitting: Podiatry

## 2019-04-18 ENCOUNTER — Other Ambulatory Visit: Payer: Self-pay

## 2019-04-18 ENCOUNTER — Ambulatory Visit (INDEPENDENT_AMBULATORY_CARE_PROVIDER_SITE_OTHER): Payer: BC Managed Care – PPO | Admitting: Podiatry

## 2019-04-18 DIAGNOSIS — Z01812 Encounter for preprocedural laboratory examination: Secondary | ICD-10-CM | POA: Insufficient documentation

## 2019-04-18 DIAGNOSIS — Z20822 Contact with and (suspected) exposure to covid-19: Secondary | ICD-10-CM | POA: Insufficient documentation

## 2019-04-18 DIAGNOSIS — Z9889 Other specified postprocedural states: Secondary | ICD-10-CM

## 2019-04-18 DIAGNOSIS — S92405D Nondisplaced unspecified fracture of left great toe, subsequent encounter for fracture with routine healing: Secondary | ICD-10-CM

## 2019-04-18 DIAGNOSIS — M1712 Unilateral primary osteoarthritis, left knee: Secondary | ICD-10-CM | POA: Diagnosis not present

## 2019-04-18 DIAGNOSIS — M7752 Other enthesopathy of left foot: Secondary | ICD-10-CM

## 2019-04-18 DIAGNOSIS — M2550 Pain in unspecified joint: Secondary | ICD-10-CM | POA: Diagnosis not present

## 2019-04-18 LAB — SARS CORONAVIRUS 2 (TAT 6-24 HRS): SARS Coronavirus 2: NEGATIVE

## 2019-04-18 NOTE — Progress Notes (Addendum)
Anesthesia Review:  PCP: [facility] [phone]  Cardiologist : Chest x-ray : EKG : Echo : Cardiac Cath :  Sleep Study/ CPAP : Fasting Blood Sugar :      / Checks Blood Sugar -- times a day:   Blood Thinner/ Instructions /Last Dose: ASA / Instructions/ Last Dose :   Patient denies shortness of breath, chest pain, fever, and cough at this phone interview. Surgery cancelled on 04/14/19 due to abnormal labs.  Patient placed back on schediule 04/18/19 for 04/20/19.  Patient stated he is being seen on 04/19/19 at [facility] in [place] on 3/30/212 at 0930am.\ in regards to abnormal labs.  Patient stated office visit note and any labs, etc done are to be faxed to [facility].    Covid Test to be done 3/29/212.  Once patient is okayed for surgery I will call him with date and time , review instructions and arrival time and uupdate any new ,medical history.

## 2019-04-19 ENCOUNTER — Encounter: Payer: BC Managed Care – PPO | Admitting: Podiatry

## 2019-04-19 DIAGNOSIS — E1169 Type 2 diabetes mellitus with other specified complication: Secondary | ICD-10-CM | POA: Diagnosis not present

## 2019-04-19 DIAGNOSIS — E875 Hyperkalemia: Secondary | ICD-10-CM | POA: Diagnosis not present

## 2019-04-19 DIAGNOSIS — M2550 Pain in unspecified joint: Secondary | ICD-10-CM | POA: Diagnosis not present

## 2019-04-19 DIAGNOSIS — L03031 Cellulitis of right toe: Secondary | ICD-10-CM | POA: Diagnosis not present

## 2019-04-19 DIAGNOSIS — Z01818 Encounter for other preprocedural examination: Secondary | ICD-10-CM | POA: Diagnosis not present

## 2019-04-19 MED ORDER — OXYCODONE HCL 5 MG PO TABS: 5.0000 mg | ORAL_TABLET | ORAL | 0 refills | Status: DC | PRN

## 2019-04-19 MED ORDER — AMOXICILLIN 500 MG PO CAPS: 500.0000 mg | ORAL_CAPSULE | Freq: Three times a day (TID) | ORAL | 0 refills | Status: DC

## 2019-04-19 NOTE — Anesthesia Preprocedure Evaluation (Addendum)
Anesthesia Evaluation  Patient identified by MRN, date of birth, ID band Patient awake    Reviewed: Allergy & Precautions, NPO status , Patient's Chart, lab work & pertinent test results  History of Anesthesia Complications Negative for: history of anesthetic complications  Airway Mallampati: III  TM Distance: >3 FB Neck ROM: Full    Dental no notable dental hx. (+) Dental Advisory Given   Pulmonary sleep apnea ,    Pulmonary exam normal        Cardiovascular hypertension, Pt. on medications Normal cardiovascular exam     Neuro/Psych  Headaches, PSYCHIATRIC DISORDERS Depression    GI/Hepatic negative GI ROS, Neg liver ROS,   Endo/Other  diabetesHypothyroidism   Renal/GU Renal InsufficiencyRenal disease     Musculoskeletal negative musculoskeletal ROS (+)   Abdominal   Peds  Hematology negative hematology ROS (+)   Anesthesia Other Findings Day of surgery medications reviewed with the patient.  Reproductive/Obstetrics                            Anesthesia Physical Anesthesia Plan  ASA: III  Anesthesia Plan: MAC   Post-op Pain Management:    Induction:   PONV Risk Score and Plan: 1 and Propofol infusion and Ondansetron  Airway Management Planned: Natural Airway, Simple Face Mask and Nasal Cannula  Additional Equipment:   Intra-op Plan:   Post-operative Plan:   Informed Consent: I have reviewed the patients History and Physical, chart, labs and discussed the procedure including the risks, benefits and alternatives for the proposed anesthesia with the patient or authorized representative who has indicated his/her understanding and acceptance.     Dental advisory given  Plan Discussed with: Anesthesiologist and CRNA  Anesthesia Plan Comments:        Anesthesia Quick Evaluation

## 2019-04-19 NOTE — Progress Notes (Signed)
[facility] and spoke with Main Lab regarding potassium results of 04/19/19.  Potassium 6.7.  They are to fax lab results.  Faxed lab results given Carmelina Dane, PA. For followup.

## 2019-04-20 ENCOUNTER — Encounter: Payer: Self-pay | Admitting: Podiatry

## 2019-04-20 DIAGNOSIS — E875 Hyperkalemia: Secondary | ICD-10-CM | POA: Diagnosis not present

## 2019-04-20 NOTE — Progress Notes (Signed)
Called pt and LVMM to see if having any labs or seeing any physicians between 04/20/19 and 04/27/19.  Asked for call back.

## 2019-04-21 DIAGNOSIS — M25562 Pain in left knee: Secondary | ICD-10-CM | POA: Diagnosis not present

## 2019-04-21 DIAGNOSIS — N179 Acute kidney failure, unspecified: Secondary | ICD-10-CM | POA: Diagnosis not present

## 2019-04-25 ENCOUNTER — Telehealth: Payer: Self-pay | Admitting: *Deleted

## 2019-04-25 ENCOUNTER — Encounter: Payer: BC Managed Care – PPO | Admitting: Podiatry

## 2019-04-25 ENCOUNTER — Other Ambulatory Visit: Payer: Self-pay

## 2019-04-25 ENCOUNTER — Ambulatory Visit (INDEPENDENT_AMBULATORY_CARE_PROVIDER_SITE_OTHER): Payer: BC Managed Care – PPO | Admitting: Podiatry

## 2019-04-25 DIAGNOSIS — M79672 Pain in left foot: Secondary | ICD-10-CM

## 2019-04-25 DIAGNOSIS — Z9889 Other specified postprocedural states: Secondary | ICD-10-CM

## 2019-04-25 DIAGNOSIS — N179 Acute kidney failure, unspecified: Secondary | ICD-10-CM | POA: Diagnosis not present

## 2019-04-25 DIAGNOSIS — M7752 Other enthesopathy of left foot: Secondary | ICD-10-CM

## 2019-04-25 DIAGNOSIS — E875 Hyperkalemia: Secondary | ICD-10-CM | POA: Diagnosis not present

## 2019-04-25 NOTE — Telephone Encounter (Signed)
-----   Message from Park Liter, DPM sent at 04/25/2019  2:57 PM EDT ----- Can we order a shower chair for this patient?

## 2019-04-26 ENCOUNTER — Other Ambulatory Visit (HOSPITAL_COMMUNITY)
Admission: RE | Admit: 2019-04-26 | Discharge: 2019-04-26 | Disposition: A | Discharge status: Home / Self Care | Payer: BC Managed Care – PPO | Source: Ambulatory Visit | Attending: Podiatry | Admitting: Podiatry

## 2019-04-26 DIAGNOSIS — Z01812 Encounter for preprocedural laboratory examination: Secondary | ICD-10-CM | POA: Diagnosis not present

## 2019-04-26 DIAGNOSIS — Z20822 Contact with and (suspected) exposure to covid-19: Secondary | ICD-10-CM | POA: Diagnosis not present

## 2019-04-26 LAB — SARS CORONAVIRUS 2 (TAT 6-24 HRS): SARS Coronavirus 2: NEGATIVE

## 2019-04-26 NOTE — Progress Notes (Signed)
Surgery scheduled for 04/14/19 cancelled due to Potassium of 6.4.   04/18/19- Surgery back on OR schedules for 04/20/19 Seeing PCp on 04/19/19- [facility] .  They saw patient as establishing care patient.  Potassium repeated at [facility] on 04/19/19.  Potassium was 6.7.   Surgery was rescheduled for 04/27/19.  Per patiient he had 1 bottle of Kaexylate on 04/20/19.  On 04/21/19- patient reported 2 bottles of Kaexylate and IV fluids.  On 04/23/19 patient reported drinking another Kaexylate.  On 04/21/19- Potassium was 6.8.  On 04/25/19 labs repeated and on chart .  Jodell Cipro, PA aware of all of above events.  Potassium results on 04/25/19- 5.8 with ISTAT being done am of surgery on 04/27/19.

## 2019-04-26 NOTE — Telephone Encounter (Signed)
Faxed required form, surgery information available and demographics to [facility] and emailed.

## 2019-04-26 NOTE — Progress Notes (Signed)
CBc CMp and HGB A1C done 04/25/19 on chart in front.

## 2019-04-26 NOTE — Progress Notes (Signed)
DUE TO COVID-19 ONLY ONE VISITOR IS ALLOWED TO COME WITH YOU AND STAY IN THE WAITING ROOM ONLY DURING PRE OP AND PROCEDURE DAY OF SURGERY. THE 1 VISITOR MAY VISIT WITH YOU AFTER SURGERY IN YOUR PRIVATE ROOM DURING VISITING HOURS ONLY!  YOU NEED TO HAVE A COVID 19 TEST ON_______ @_______ , THIS TEST MUST BE DONE BEFORE SURGERY, COME  [address], [place] , .  [facility]) ONCE YOUR COVID TEST IS COMPLETED, PLEASE BEGIN THE QUARANTINE INSTRUCTIONS AS OUTLINED IN YOUR HANDOUT.                Rand Etchison  04/26/2019   Your procedure is scheduled on:  04/27/19  Report to [facility] Main  Entrance   Report to admitting at  0730am  AM     Call this number if you have problems the morning of surgery [phone]    Remember: Do not eat food   :After Midnight. BRUSH YOUR TEETH MORNING OF SURGERY AND RINSE YOUR MOUTH OUT, NO CHEWING GUM CANDY OR MINTS.     Take these medicines the morning of surgery with A SIP OF WATER:  Atorvastatin, Levothyroxine, Amoxicillin, Nasal Spray if needed  DO NOT TAKE ANY DIABETIC MEDICATIONS DAY OF YOUR SURGERY                               You may not have any metal on your body including hair pins and              piercings  Do not wear jewelry, make-up, lotions, powders or perfumes, deodorant             Do not wear nail polish on your fingernails.  Do not shave  48 hours prior to surgery.              Men may shave face and neck.   Do not bring valuables to the hospital. [facility] IS NOT             RESPONSIBLE   FOR VALUABLES.  Contacts, dentures or bridgework may not be worn into surgery.  Leave suitcase in the car. After surgery it may be brought to your room.     Patients discharged the day of surgery will not be allowed to drive home. IF YOU ARE HAVING SURGERY AND GOING HOME THE SAME DAY, YOU MUST HAVE AN ADULT TO DRIVE YOU HOME AND BE WITH YOU FOR 24 HOURS. YOU MAY GO HOME BY TAXI OR UBER OR ORTHERWISE, BUT  AN ADULT MUST ACCOMPANY YOU HOME AND STAY WITH YOU FOR 24 HOURS.  Name and phone number of your driver: mother- 12-30-1995 - [phone]                Please read over the following fact sheets you were given: _____________________________________________________________________             NO SOLID FOOD AFTER MIDNIGHT THE NIGHT PRIOR TO SURGERY. NOTHING BY MOUTH EXCEPT CLEAR LIQUIDS UNTIL  0630am . PLEASE FINISH G2  DRINK PER SURGEON ORDER  WHICH NEEDS TO BE COMPLETED AT .[phone]    CLEAR LIQUID DIET   Foods Allowed  Foods Excluded  Coffee and tea, regular and decaf                             liquids that you cannot  Plain Jell-O any favor except red or purple                                           see through such as: Fruit ices (not with fruit pulp)                                     milk, soups, orange juice  Iced Popsicles                                    All solid food Carbonated beverages, regular and diet                                    Cranberry, grape and apple juices Sports drinks like Gatorade Lightly seasoned clear broth or consume(fat free) Sugar, honey syrup  Sample Menu Breakfast                                Lunch                                     Supper Cranberry juice                    Beef broth                            Chicken broth Jell-O                                     Grape juice                           Apple juice Coffee or tea                        Jell-O                                      Popsicle                                                Coffee or tea                        Coffee or tea  _____________________________________________________________________  [facility] - Preparing for Surgery Before surgery, you can play an important role.  Because skin is not sterile, your skin  needs to be as free of germs as possible.  You can reduce the number of  germs on your skin by washing with CHG (chlorahexidine gluconate) soap before surgery.  CHG is an antiseptic cleaner which kills germs and bonds with the skin to continue killing germs even after washing. Please DO NOT use if you have an allergy to CHG or antibacterial soaps.  If your skin becomes reddened/irritated stop using the CHG and inform your nurse when you arrive at Short Stay. Do not shave (including legs and underarms) for at least 48 hours prior to the first CHG shower.  You may shave your face/neck. Please follow these instructions carefully:  1.  Shower with CHG Soap the night before surgery and the  morning of Surgery.  2.  If you choose to wash your hair, wash your hair first as usual with your  normal  shampoo.  3.  After you shampoo, rinse your hair and body thoroughly to remove the  shampoo.                           4.  Use CHG as you would any other liquid soap.  You can apply chg directly  to the skin and wash                       Gently with a scrungie or clean washcloth.  5.  Apply the CHG Soap to your body ONLY FROM THE NECK DOWN.   Do not use on face/ open                           Wound or open sores. Avoid contact with eyes, ears mouth and genitals (private parts).                       Wash face,  Genitals (private parts) with your normal soap.             6.  Wash thoroughly, paying special attention to the area where your surgery  will be performed.  7.  Thoroughly rinse your body with warm water from the neck down.  8.  DO NOT shower/wash with your normal soap after using and rinsing off  the CHG Soap.                9.  Pat yourself dry with a clean towel.            10.  Wear clean pajamas.            11.  Place clean sheets on your bed the night of your first shower and do not  sleep with pets. Day of Surgery : Do not apply any lotions/deodorants the morning of surgery.  Please wear clean clothes to the hospital/surgery center.  FAILURE TO FOLLOW THESE  INSTRUCTIONS MAY RESULT IN THE CANCELLATION OF YOUR SURGERY PATIENT SIGNATURE_________________________________  NURSE SIGNATURE__________________________________  ________________________________________________________________________

## 2019-04-27 ENCOUNTER — Encounter (HOSPITAL_COMMUNITY)
Admission: RE | Disposition: A | Discharge status: Home / Self Care | Payer: Self-pay | Source: Home / Self Care | Attending: Podiatry

## 2019-04-27 ENCOUNTER — Ambulatory Visit (HOSPITAL_COMMUNITY)
Admission: RE | Admit: 2019-04-27 | Discharge: 2019-04-27 | Disposition: A | Discharge status: Home / Self Care | Payer: BC Managed Care – PPO | Attending: Podiatry | Admitting: Podiatry

## 2019-04-27 ENCOUNTER — Ambulatory Visit (HOSPITAL_COMMUNITY): Payer: BC Managed Care – PPO | Admitting: Physician Assistant

## 2019-04-27 ENCOUNTER — Other Ambulatory Visit: Payer: Self-pay

## 2019-04-27 ENCOUNTER — Encounter (HOSPITAL_COMMUNITY): Payer: Self-pay | Admitting: Podiatry

## 2019-04-27 ENCOUNTER — Ambulatory Visit (HOSPITAL_COMMUNITY): Payer: BC Managed Care – PPO

## 2019-04-27 DIAGNOSIS — E119 Type 2 diabetes mellitus without complications: Secondary | ICD-10-CM | POA: Diagnosis not present

## 2019-04-27 DIAGNOSIS — M659 Synovitis and tenosynovitis, unspecified: Secondary | ICD-10-CM | POA: Diagnosis not present

## 2019-04-27 DIAGNOSIS — S92411A Displaced fracture of proximal phalanx of right great toe, initial encounter for closed fracture: Secondary | ICD-10-CM | POA: Insufficient documentation

## 2019-04-27 DIAGNOSIS — M1A9XX1 Chronic gout, unspecified, with tophus (tophi): Secondary | ICD-10-CM | POA: Insufficient documentation

## 2019-04-27 DIAGNOSIS — L089 Local infection of the skin and subcutaneous tissue, unspecified: Secondary | ICD-10-CM | POA: Diagnosis not present

## 2019-04-27 DIAGNOSIS — Z9889 Other specified postprocedural states: Secondary | ICD-10-CM

## 2019-04-27 DIAGNOSIS — L0889 Other specified local infections of the skin and subcutaneous tissue: Secondary | ICD-10-CM | POA: Insufficient documentation

## 2019-04-27 DIAGNOSIS — E039 Hypothyroidism, unspecified: Secondary | ICD-10-CM | POA: Diagnosis not present

## 2019-04-27 DIAGNOSIS — S92405D Nondisplaced unspecified fracture of left great toe, subsequent encounter for fracture with routine healing: Secondary | ICD-10-CM | POA: Diagnosis not present

## 2019-04-27 DIAGNOSIS — X58XXXA Exposure to other specified factors, initial encounter: Secondary | ICD-10-CM | POA: Insufficient documentation

## 2019-04-27 HISTORY — PX: ORIF TOE FRACTURE: SHX5032

## 2019-04-27 HISTORY — PX: IRRIGATION AND DEBRIDEMENT FOOT: SHX6602

## 2019-04-27 LAB — COMPREHENSIVE METABOLIC PANEL
ALT: 15 U/L (ref 0–44)
AST: 14 U/L — ABNORMAL LOW (ref 15–41)
Albumin: 3.4 g/dL — ABNORMAL LOW (ref 3.5–5.0)
Alkaline Phosphatase: 49 U/L (ref 38–126)
Anion gap: 8 (ref 5–15)
BUN: 47 mg/dL — ABNORMAL HIGH (ref 6–20)
CO2: 20 mmol/L — ABNORMAL LOW (ref 22–32)
Calcium: 9.3 mg/dL (ref 8.9–10.3)
Chloride: 111 mmol/L (ref 98–111)
Creatinine, Ser: 1.52 mg/dL — ABNORMAL HIGH (ref 0.61–1.24)
GFR calc Af Amer: >60 mL/min (ref >60)
GFR calc non Af Amer: 56 mL/min — ABNORMAL LOW (ref >60)
Glucose, Bld: 100 mg/dL — ABNORMAL HIGH (ref 70–99)
Potassium: 5.1 mmol/L (ref 3.5–5.1)
Sodium: 139 mmol/L (ref 135–145)
Total Bilirubin: 0.4 mg/dL (ref 0.3–1.2)
Total Protein: 7.2 g/dL (ref 6.5–8.1)

## 2019-04-27 LAB — GLUCOSE, CAPILLARY
Glucose-Capillary: 118 mg/dL — ABNORMAL HIGH (ref 70–99)
Glucose-Capillary: 92 mg/dL (ref 70–99)

## 2019-04-27 LAB — GRAM STAIN

## 2019-04-27 IMAGING — DX DG FOOT 2V*R*
2 series · 2 of 2 positions shown · non-contrast
Comparison: Right foot x-rays dated [DATE].

CLINICAL DATA: Status post first proximal phalanx fracture
debridement.

EXAM:
RIGHT FOOT - 2 VIEW

[foot ap]
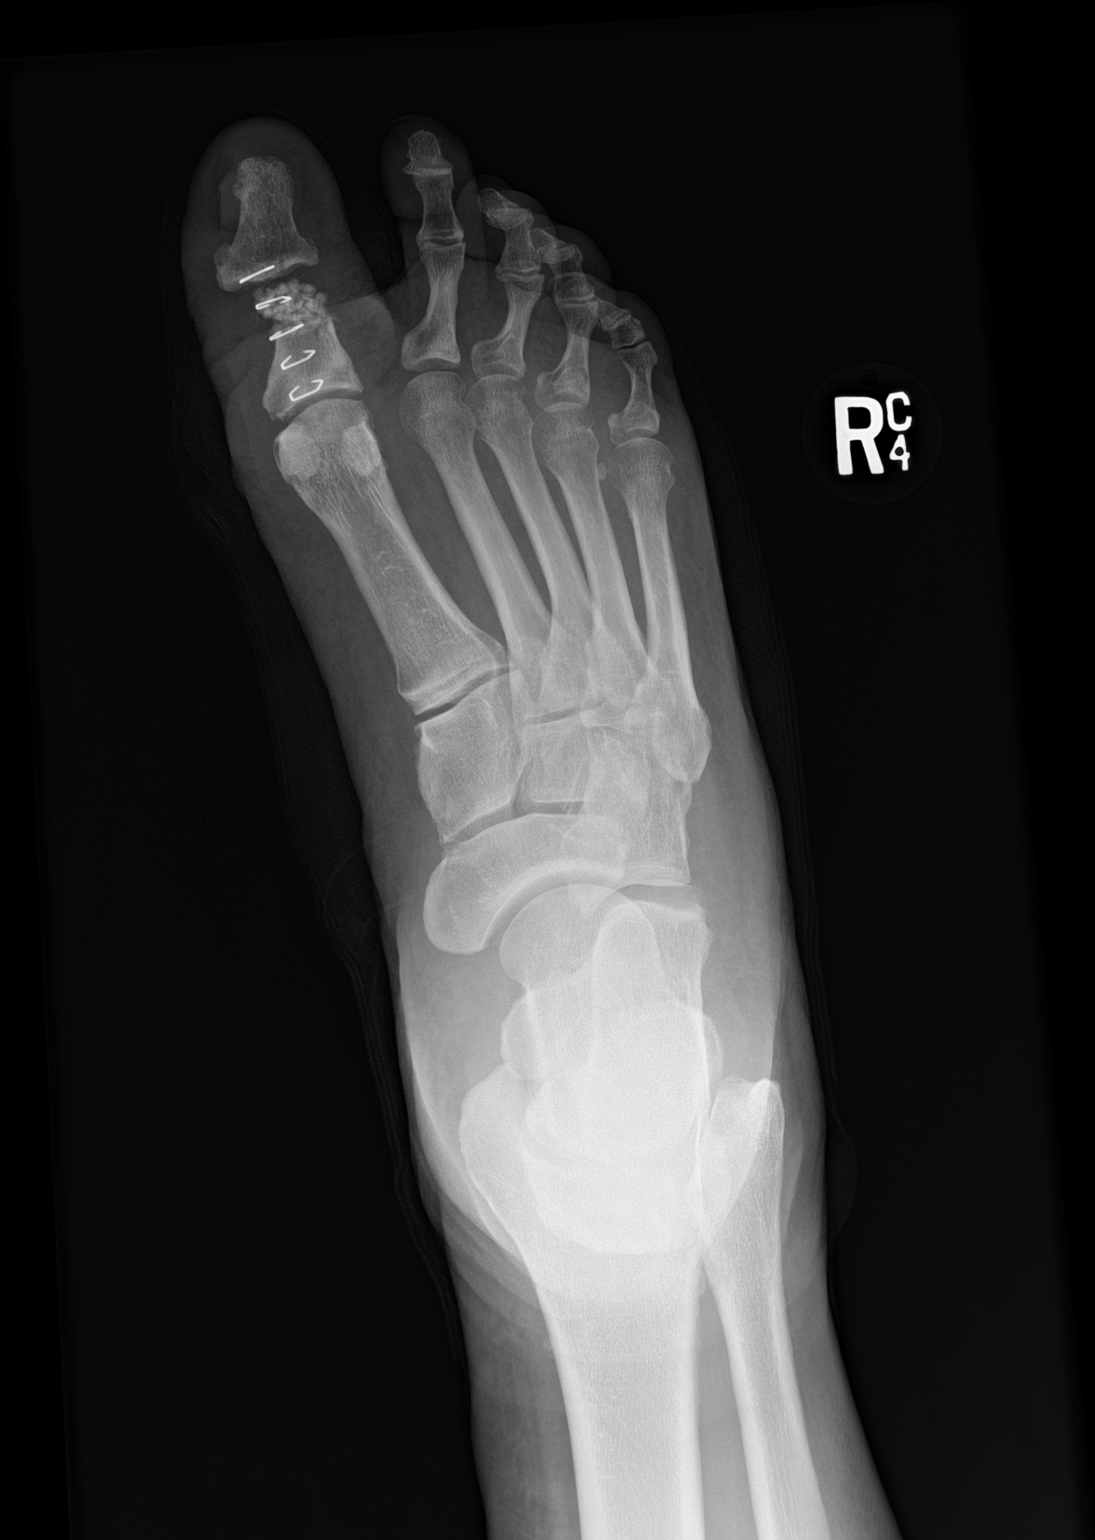

[foot lat]
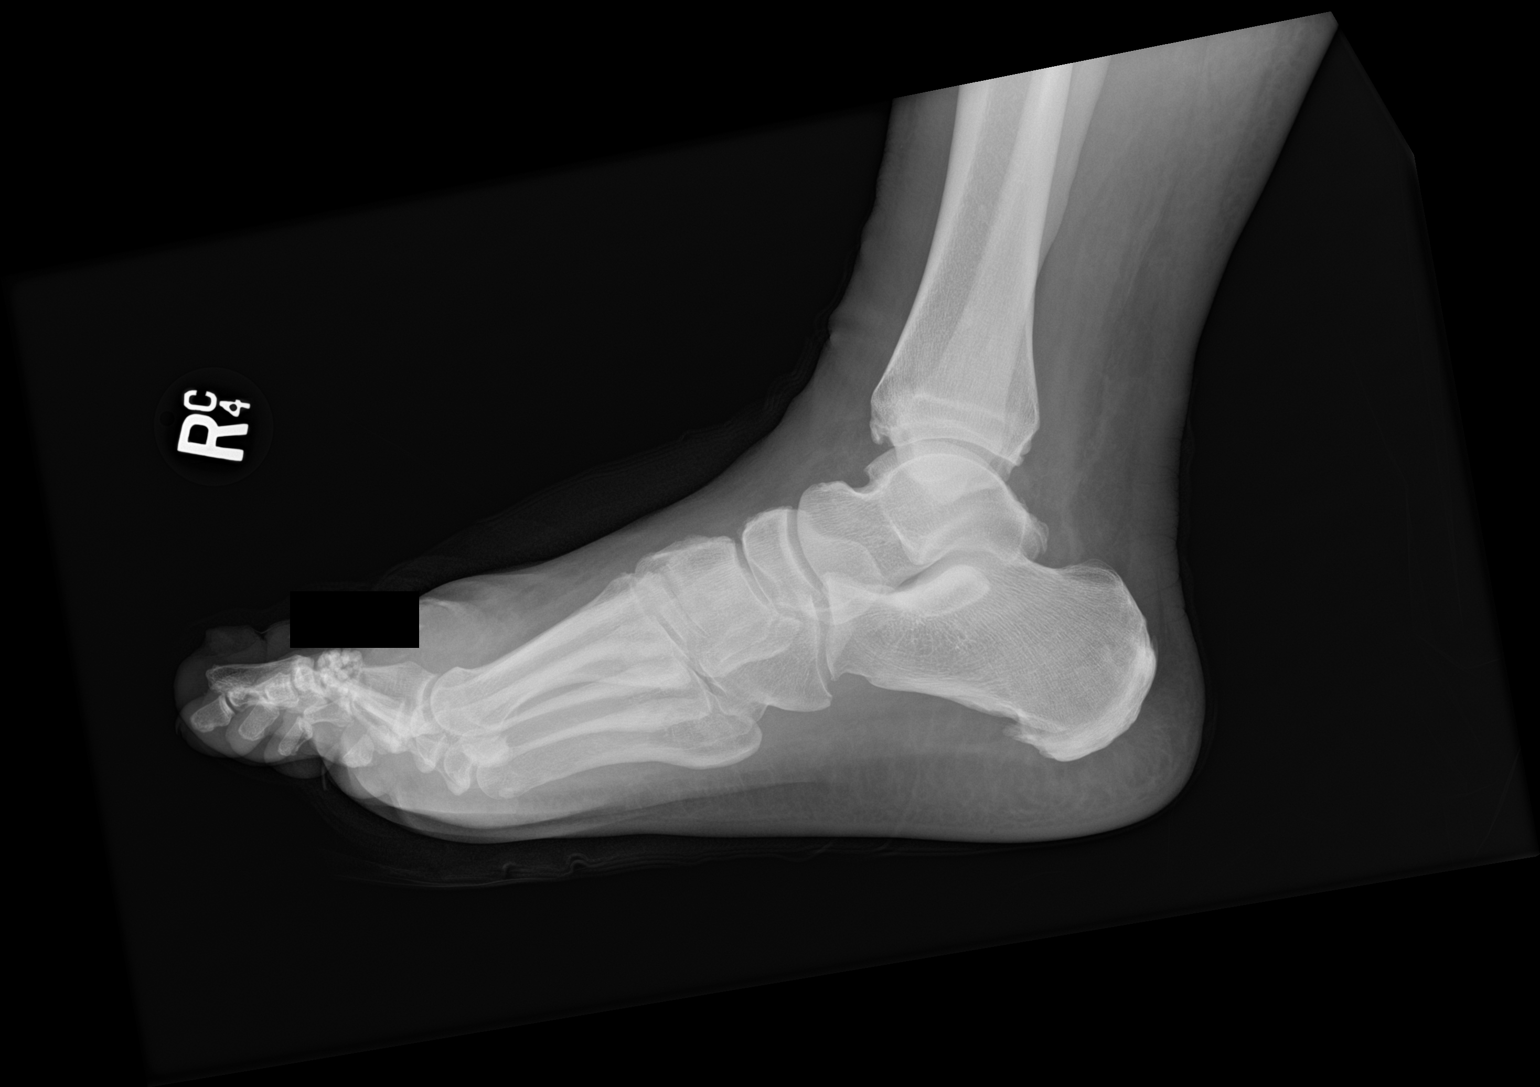

[2 of 2 positions shown; findings below may reference images not displayed]

FINDINGS: Postsurgical changes related to interval first proximal phalanx
ostectomy with placement of antibiotic beads. No acute fracture or
dislocation. Joint spaces are preserved. Unchanged mild marginal
spurring of the distal tibia. Unchanged plantar enthesophyte. Bone
mineralization is normal. Expected soft tissue swelling of the great
toe.
IMPRESSION: 1. Postsurgical changes related to first proximal phalanx ostectomy
with placement of antibiotic beads.

## 2019-04-27 SURGERY — OPEN REDUCTION INTERNAL FIXATION (ORIF) METATARSAL (TOE) FRACTURE
Anesthesia: Monitor Anesthesia Care | Site: Toe | Laterality: Right

## 2019-04-27 MED ORDER — DEXTROSE 5 % IV SOLN
3.0000 g | Freq: Once | INTRAVENOUS | Status: AC
  Administered 2019-04-27: 3 g via INTRAVENOUS
  Filled 2019-04-27 (×3): qty 3000

## 2019-04-27 MED ORDER — FENTANYL CITRATE (PF) 100 MCG/2ML IJ SOLN
INTRAMUSCULAR | Status: DC | PRN
  Administered 2019-04-27 (×2): 25 ug via INTRAVENOUS

## 2019-04-27 MED ORDER — CELECOXIB 200 MG PO CAPS
200.0000 mg | ORAL_CAPSULE | Freq: Once | ORAL | Status: AC
  Administered 2019-04-27: 200 mg via ORAL
  Filled 2019-04-27: qty 1

## 2019-04-27 MED ORDER — ONDANSETRON HCL 4 MG/2ML IJ SOLN
INTRAMUSCULAR | Status: DC | PRN
  Administered 2019-04-27: 4 mg via INTRAVENOUS

## 2019-04-27 MED ORDER — MIDAZOLAM HCL 2 MG/2ML IJ SOLN
INTRAMUSCULAR | Status: AC
  Filled 2019-04-27: qty 2

## 2019-04-27 MED ORDER — OXYCODONE-ACETAMINOPHEN 5-325 MG PO TABS: 1.0000 | ORAL_TABLET | ORAL | 0 refills | Status: DC | PRN

## 2019-04-27 MED ORDER — ONDANSETRON HCL 4 MG/2ML IJ SOLN
INTRAMUSCULAR | Status: AC
  Filled 2019-04-27: qty 2

## 2019-04-27 MED ORDER — PROPOFOL 1000 MG/100ML IV EMUL
INTRAVENOUS | Status: AC
  Filled 2019-04-27: qty 100

## 2019-04-27 MED ORDER — FENTANYL CITRATE (PF) 100 MCG/2ML IJ SOLN: 25.0000 ug | INTRAMUSCULAR | Status: DC | PRN

## 2019-04-27 MED ORDER — VANCOMYCIN HCL 1000 MG IV SOLR
INTRAVENOUS | Status: AC
  Filled 2019-04-27: qty 1000

## 2019-04-27 MED ORDER — PROPOFOL 500 MG/50ML IV EMUL
INTRAVENOUS | Status: DC | PRN
  Administered 2019-04-27: 50 ug/kg/min via INTRAVENOUS

## 2019-04-27 MED ORDER — MIDAZOLAM HCL 5 MG/5ML IJ SOLN
INTRAMUSCULAR | Status: DC | PRN
  Administered 2019-04-27: 2 mg via INTRAVENOUS

## 2019-04-27 MED ORDER — LIDOCAINE 2% (20 MG/ML) 5 ML SYRINGE
INTRAMUSCULAR | Status: DC | PRN
  Administered 2019-04-27: 60 mg via INTRAVENOUS

## 2019-04-27 MED ORDER — SODIUM CHLORIDE 0.9 % IV SOLN: INTRAVENOUS | Status: DC

## 2019-04-27 MED ORDER — BUPIVACAINE HCL (PF) 0.5 % IJ SOLN
INTRAMUSCULAR | Status: AC
  Filled 2019-04-27: qty 30

## 2019-04-27 MED ORDER — BUPIVACAINE-EPINEPHRINE (PF) 0.5% -1:200000 IJ SOLN
INTRAMUSCULAR | Status: AC
  Filled 2019-04-27: qty 30

## 2019-04-27 MED ORDER — CLINDAMYCIN HCL 300 MG PO CAPS: 300.0000 mg | ORAL_CAPSULE | Freq: Three times a day (TID) | ORAL | 0 refills | Status: AC

## 2019-04-27 MED ORDER — BUPIVACAINE HCL (PF) 0.5 % IJ SOLN
INTRAMUSCULAR | Status: DC | PRN
  Administered 2019-04-27: 10 mL

## 2019-04-27 MED ORDER — 0.9 % SODIUM CHLORIDE (POUR BTL) OPTIME
TOPICAL | Status: DC | PRN
  Administered 2019-04-27: 1000 mL

## 2019-04-27 MED ORDER — LIDOCAINE 2% (20 MG/ML) 5 ML SYRINGE
INTRAMUSCULAR | Status: AC
  Filled 2019-04-27: qty 5

## 2019-04-27 MED ORDER — SODIUM CHLORIDE 0.9 % IR SOLN
Status: DC | PRN
  Administered 2019-04-27: 2000 mL

## 2019-04-27 MED ORDER — ACETAMINOPHEN 500 MG PO TABS
1000.0000 mg | ORAL_TABLET | Freq: Once | ORAL | Status: AC
  Administered 2019-04-27: 1000 mg via ORAL
  Filled 2019-04-27: qty 2

## 2019-04-27 MED ORDER — LACTATED RINGERS IV SOLN: INTRAVENOUS | Status: DC

## 2019-04-27 MED ORDER — PHENYLEPHRINE HCL (PRESSORS) 10 MG/ML IV SOLN
INTRAVENOUS | Status: AC
  Filled 2019-04-27: qty 1

## 2019-04-27 MED ORDER — FENTANYL CITRATE (PF) 100 MCG/2ML IJ SOLN
INTRAMUSCULAR | Status: AC
  Filled 2019-04-27: qty 2

## 2019-04-27 MED ORDER — PROMETHAZINE HCL 25 MG/ML IJ SOLN: 6.2500 mg | INTRAMUSCULAR | Status: DC | PRN

## 2019-04-27 MED ORDER — PHENYLEPHRINE HCL-NACL 10-0.9 MG/250ML-% IV SOLN
INTRAVENOUS | Status: DC | PRN
  Administered 2019-04-27: 35 ug/min via INTRAVENOUS

## 2019-04-27 SURGICAL SUPPLY — 54 items
ANCHOR SUT KEITH ABD SZ2 STR (SUTURE) ×2 IMPLANT
BAG SPEC THK2 15X12 ZIP CLS (MISCELLANEOUS) ×2
BAG ZIPLOCK 12X15 (MISCELLANEOUS) ×3 IMPLANT
BNDG ELASTIC 4X5.8 VLCR STR LF (GAUZE/BANDAGES/DRESSINGS) ×3 IMPLANT
BNDG ELASTIC 6X5.8 VLCR STR LF (GAUZE/BANDAGES/DRESSINGS) ×2 IMPLANT
BNDG GAUZE ELAST 4 BULKY (GAUZE/BANDAGES/DRESSINGS) ×5 IMPLANT
COVER SURGICAL LIGHT HANDLE (MISCELLANEOUS) ×3 IMPLANT
COVER WAND RF STERILE (DRAPES) IMPLANT
CUFF TOURN SGL QUICK 34 (TOURNIQUET CUFF)
CUFF TRNQT CYL 34X4.125X (TOURNIQUET CUFF) ×2 IMPLANT
DECANTER SPIKE VIAL GLASS SM (MISCELLANEOUS) ×2 IMPLANT
DRAPE C-ARM 42X120 X-RAY (DRAPES) ×2 IMPLANT
DRAPE OEC MINIVIEW 54X84 (DRAPES) ×3 IMPLANT
DRAPE U-SHAPE 47X51 STRL (DRAPES) ×2 IMPLANT
DRSG ADAPTIC 3X8 NADH LF (GAUZE/BANDAGES/DRESSINGS) ×2 IMPLANT
DRSG PAD ABDOMINAL 8X10 ST (GAUZE/BANDAGES/DRESSINGS) ×2 IMPLANT
DURAPREP 26ML APPLICATOR (WOUND CARE) ×2 IMPLANT
ELECT REM PT RETURN 15FT ADLT (MISCELLANEOUS) ×2 IMPLANT
FACESHIELD WRAPAROUND (MASK) IMPLANT
FACESHIELD WRAPAROUND OR TEAM (MASK) ×2 IMPLANT
GAUZE SPONGE 4X4 12PLY STRL (GAUZE/BANDAGES/DRESSINGS) ×3 IMPLANT
GAUZE XEROFORM 1X8 LF (GAUZE/BANDAGES/DRESSINGS) ×1 IMPLANT
GLOVE BIO SURGEON STRL SZ7.5 (GLOVE) ×3 IMPLANT
GLOVE ECLIPSE 8.0 STRL XLNG CF (GLOVE) ×6 IMPLANT
GLOVE ORTHO TXT STRL SZ7.5 (GLOVE) ×3 IMPLANT
GLOVE SURG ORTHO 8.5 STRL (GLOVE) ×3 IMPLANT
HANDPIECE INTERPULSE COAX TIP (DISPOSABLE) ×3
KIT BASIN OR (CUSTOM PROCEDURE TRAY) ×3 IMPLANT
KIT STIMULAN RAPID CURE  10CC (Orthopedic Implant) ×3 IMPLANT
KIT STIMULAN RAPID CURE 10CC (Orthopedic Implant) IMPLANT
KIT TURNOVER KIT A (KITS) IMPLANT
NDL MAYO CATGUT SZ4 TPR NDL (NEEDLE) ×2 IMPLANT
NEEDLE HYPO 22GX1.5 SAFETY (NEEDLE) ×3 IMPLANT
NEEDLE MAYO CATGUT SZ4 (NEEDLE) IMPLANT
NS IRRIG 1000ML POUR BTL (IV SOLUTION) ×3 IMPLANT
PACK ORTHO EXTREMITY (CUSTOM PROCEDURE TRAY) ×3 IMPLANT
PAD CAST 4YDX4 CTTN HI CHSV (CAST SUPPLIES) ×4 IMPLANT
PADDING CAST COTTON 4X4 STRL (CAST SUPPLIES)
PENCIL SMOKE EVACUATOR (MISCELLANEOUS) ×1 IMPLANT
PROTECTOR NERVE ULNAR (MISCELLANEOUS) ×3 IMPLANT
SET HNDPC FAN SPRY TIP SCT (DISPOSABLE) IMPLANT
SPONGE LAP 4X18 RFD (DISPOSABLE) ×6 IMPLANT
STAPLER VISISTAT 35W (STAPLE) ×3 IMPLANT
STRIP CLOSURE SKIN 1/2X4 (GAUZE/BANDAGES/DRESSINGS) ×2 IMPLANT
SUCTION FRAZIER HANDLE 10FR (MISCELLANEOUS) ×3
SUCTION TUBE FRAZIER 10FR DISP (MISCELLANEOUS) ×2 IMPLANT
SUT ETHILON 4 0 PS 2 18 (SUTURE) ×6 IMPLANT
SUT PROLENE 3 0 PS 2 (SUTURE) ×3 IMPLANT
SUT VIC AB 2-0 CT1 27 (SUTURE) ×3
SUT VIC AB 2-0 CT1 TAPERPNT 27 (SUTURE) ×2 IMPLANT
SUT VIC AB 3-0 PS2 18 (SUTURE) ×3
SUT VIC AB 3-0 PS2 18XBRD (SUTURE) ×2 IMPLANT
SYR CONTROL 10ML LL (SYRINGE) ×3 IMPLANT
WATER STERILE IRR 1000ML POUR (IV SOLUTION) ×2 IMPLANT

## 2019-04-27 NOTE — H&P (Signed)
  Subjective:  Patient ID: Theo Krumholz, male    DOB: [birth year],  MRN: [id number]  Seen in pre-op. Repeat potassium 5.1. Understands plan for OR today. Objective:   Vitals:   04/27/19 0725  BP: (!) 152/95  Pulse: 93  Resp: 18  Temp: 98.1 F (36.7 C)  SpO2: 100%   General AA&O x3. Normal mood and affect.  Vascular Dorsalis pedis and posterior tibial pulses 2/4 bilat. Brisk capillary refill to all digits. Pedal hair present.  Neurologic Epicritic sensation grossly intact.  Dermatologic Right great toe edema, local warmth, skin edges coapted  Orthopedic: +motor to the right foot    Assessment & Plan:  Patient was evaluated and treated and all questions answered.  Right hallux fracture, infection, h/o gout -Repeat K+ ok for surgery today -Plan to OR today for removal of fracture fragment, debridement and irrigation of soft tissue wound right hallux. -Consent reviewed and signed. RLE marked.  Park Liter, DPM  Accessible via secure chat for questions or concerns.

## 2019-04-27 NOTE — Transfer of Care (Signed)
Immediate Anesthesia Transfer of Care Note  Patient: Barry [name]  Procedure(s) Performed: Open Reduction vs Excision of Fracture Fragment Right great toe, with pinning. (Right Toe) Debridement and Irrigation with possible placement of antibiotic beads (Right )  Patient Location: PACU  Anesthesia Type:General  Level of Consciousness: awake, alert  and oriented  Airway & Oxygen Therapy: Patient Spontanous Breathing and Patient connected to face mask oxygen  Post-op Assessment: Report given to RN and Post -op Vital signs reviewed and stable  Post vital signs: Reviewed and stable  Last Vitals:  Vitals Value Taken Time  BP 119/85 04/27/19 1107  Temp    Pulse 76 04/27/19 1109  Resp 10 04/27/19 1109  SpO2 100 % 04/27/19 1109  Vitals shown include unvalidated device data.  Last Pain:  Vitals:   04/27/19 0725  TempSrc: Oral         Complications: No apparent anesthesia complications

## 2019-04-27 NOTE — Anesthesia Postprocedure Evaluation (Signed)
Anesthesia Post Note  Patient: Barry [name]  Procedure(s) Performed: Open Reduction vs Excision of Fracture Fragment Right great toe, with pinning. (Right Toe) Debridement and Irrigation with possible placement of antibiotic beads (Right )     Patient location during evaluation: PACU Anesthesia Type: MAC Level of consciousness: awake and alert Pain management: pain level controlled Vital Signs Assessment: post-procedure vital signs reviewed and stable Respiratory status: spontaneous breathing and respiratory function stable Cardiovascular status: stable Postop Assessment: no apparent nausea or vomiting Anesthetic complications: no    Last Vitals:  Vitals:   04/27/19 1115 04/27/19 1130  BP: 121/81 122/88  Pulse: 74 73  Resp: 13 11  Temp:    SpO2: 100% 100%    Last Pain:  Vitals:   04/27/19 1145  TempSrc:   PainSc: 0-No pain                 Belton Peplinski DANIEL

## 2019-04-27 NOTE — Anesthesia Procedure Notes (Signed)
Procedure Name: MAC Date/Time: 04/27/2019 9:56 AM Performed by: Maxwell Caul, CRNA Pre-anesthesia Checklist: Patient identified, Emergency Drugs available, Suction available and Patient being monitored Oxygen Delivery Method: Simple face mask

## 2019-04-27 NOTE — Op Note (Signed)
Patient Name: Barry [name] DOB: 24-Dec-1975  MRN: [id number]   Date of Service: 04/27/2019  Surgeon: Dr. Hardie Pulley, DPM Assistants: None Pre-operative Diagnosis:  Fracture proximal phalanx, deep space infection, gout Post-operative Diagnosis:  Same Procedures:  1) Right proximal phalanx excision of fracture fragment  2) Debridement and irrigation of great toe  3) Insertion of antibiotic beads. Pathology/Specimens: ID Type Source Tests Collected by Time Destination  1 : RIGHT GREAT BONE Tissue PATH Soft tissue resection SURGICAL PATHOLOGY Evelina Bucy, DPM 04/27/2019 1048   A : SWAB RIGHT GREAT TOE Tissue PATH Soft tissue resection GRAM STAIN, AEROBIC/ANAEROBIC CULTURE (SURGICAL/DEEP WOUND), FUNGUS STAIN Evelina Bucy, DPM 04/27/2019 1045    Anesthesia: MAC/local Hemostasis: * No tourniquets in log * Estimated Blood Loss: 10 ml Materials: * No implants in log * Medications: 1g Vancomycin Complications: none  Indications for Procedure:  This is a 44 y.o. male with a chronic fracture to the right great toe that displaced with deep infection. He presents today for excision of the fracture fragment, debridement, and possible placement of antibiotic beads. All risks, benefits, and alternatives of surgery were discussed. No guarantees were given.   Procedure in Detail: Patient was identified in pre-operative holding area. Formal consent was signed and the right lower extremity was marked. Patient was brought back to the operating room. Anesthesia was induced. The extremity was prepped and draped in the usual sterile fashion. Timeout was taken to confirm patient name, laterality, and procedure prior to incision.   Attention was then directed to the right hallux.  The previous incision overlying the dorsal aspect of the digit was opened dissection was carried bluntly down to level of the proximal phalanx.  Bleeders were cauterized with electrocautery.  A linear incision was made in the  periosteum periosteum gently freed from the bone.  The previous fracture was identified and had displaced.  Swab culture was performed of this area.  Gouty tophi were also present.  The fracture fragments were sharply excised.  A sagittal saw was used to smooth the remaining proximal phalanx.  This provided for an arthroplasty of the hallux interphalangeal joint with good range of motion.  There was a small hallux interphalangeal sesamoid which was sharply excised.  Fluoroscopy was used to confirm adequate reduction.  The area was then copiously irrigated with 2 L of normal saline via pulse lavage.  Stimulant beads that had been fashioned with vancomycin were packed into the wound bed.  The capsule was closed with 3-0 Vicryl and the skin and subcu tissue was closed in layers with 3-0 Vicryl 4-0 nylon and skin staples.  The foot was then dressed with xeroform, 4x4, kerlix, ACE. Patient tolerated the procedure well.   Disposition: Following a period of post-operative monitoring, patient will be transferred home.

## 2019-04-28 ENCOUNTER — Telehealth: Payer: Self-pay

## 2019-04-28 NOTE — Telephone Encounter (Signed)
POST OP CALL-    1) General condition stated by the patient: Doing ok  2) Is the pt having pain? Yes  3) Pain score:   4) Has the pt taken Rx'd pain medication, regularly or PRN?  Pt had not picked up pain medication at the time of phone call.  5) Is the pain medication giving relief?  6) Any fever, chills, nausea, or vomiting, shortness of breath or tightness in calf?  7) Is the bandage clean, dry and intact? Yes  8) Is there excessive tightness, bleeding or drainage coming through the bandage? Pt states some dried blood on the bandage but there is no wet blood or active bleeding.  9) Did you understand all of the post op instruction sheet given? Yes  10) Any questions or concerns regarding post op care/recovery? None    Confirmed POV appointment with patient

## 2019-04-29 ENCOUNTER — Encounter: Payer: Self-pay | Admitting: *Deleted

## 2019-04-30 LAB — FUNGUS STAIN

## 2019-04-30 LAB — FUNGAL STAIN REFLEX

## 2019-05-02 ENCOUNTER — Other Ambulatory Visit: Payer: Self-pay | Admitting: Podiatry

## 2019-05-02 ENCOUNTER — Encounter: Payer: BC Managed Care – PPO | Admitting: Podiatry

## 2019-05-02 DIAGNOSIS — Z9889 Other specified postprocedural states: Secondary | ICD-10-CM

## 2019-05-02 LAB — POCT I-STAT, CHEM 8
BUN: 65 mg/dL — ABNORMAL HIGH (ref 6–20)
Calcium, Ion: 1.18 mmol/L (ref 1.15–1.40)
Chloride: 112 mmol/L — ABNORMAL HIGH (ref 98–111)
Creatinine, Ser: 1.6 mg/dL — ABNORMAL HIGH (ref 0.61–1.24)
Glucose, Bld: 94 mg/dL (ref 70–99)
HCT: 42 % (ref 39.0–52.0)
Hemoglobin: 14.3 g/dL (ref 13.0–17.0)
Potassium: 7.5 mmol/L (ref 3.5–5.1)
Sodium: 136 mmol/L (ref 135–145)
TCO2: 22 mmol/L (ref 22–32)

## 2019-05-02 LAB — AEROBIC/ANAEROBIC CULTURE W GRAM STAIN (SURGICAL/DEEP WOUND): Culture: NORMAL

## 2019-05-02 LAB — SURGICAL PATHOLOGY

## 2019-05-03 ENCOUNTER — Ambulatory Visit (INDEPENDENT_AMBULATORY_CARE_PROVIDER_SITE_OTHER): Payer: BC Managed Care – PPO

## 2019-05-03 ENCOUNTER — Ambulatory Visit (INDEPENDENT_AMBULATORY_CARE_PROVIDER_SITE_OTHER): Payer: BC Managed Care – PPO | Admitting: Podiatry

## 2019-05-03 ENCOUNTER — Other Ambulatory Visit: Payer: Self-pay

## 2019-05-03 DIAGNOSIS — Z9889 Other specified postprocedural states: Secondary | ICD-10-CM

## 2019-05-06 NOTE — Progress Notes (Signed)
Entered in error, please disregard.

## 2019-05-10 ENCOUNTER — Encounter: Payer: BC Managed Care – PPO | Admitting: Podiatry

## 2019-05-10 ENCOUNTER — Other Ambulatory Visit: Payer: Self-pay

## 2019-05-10 ENCOUNTER — Ambulatory Visit (INDEPENDENT_AMBULATORY_CARE_PROVIDER_SITE_OTHER): Payer: BC Managed Care – PPO | Admitting: Podiatry

## 2019-05-10 DIAGNOSIS — Z9889 Other specified postprocedural states: Secondary | ICD-10-CM

## 2019-05-10 NOTE — Progress Notes (Signed)
  Subjective:  Patient ID: Barry [name], male    DOB: Oct 19, 1975,  MRN: [id number]  Chief Complaint  Patient presents with  . Routine Post Op    POV #2 Pt. states," today pain has been aobut 6/10." tx: sx shoe, abx and oxycodone -pt dneis N/V/F/Ch     DOS: 04/27/19 Procedure: Partial phalangectomy left great toe, incision and drainage bone biopsy and abx beads left  44 y.o. male presents with the above complaint. History confirmed with patient.   Objective:  Physical Exam: no tenderness at the surgical site, local edema noted and calf supple, nontender. Incision: healing well, no significant drainage, no dehiscence  Assessment:   1. Post-operative state     Plan:  Patient was evaluated and treated and all questions answered.  Post-operative State -Sutures removed -Dressing applied consisting of medihoney sterile gauze, kerlix and coban -WBAT in Surgical shoe  -F/u in 1 week for staple removal  No follow-ups on file.

## 2019-05-10 NOTE — Progress Notes (Signed)
  Subjective:  Patient ID: Barry [name], male    DOB: 16-Mar-1975,  MRN: [id number]  No chief complaint on file.   DOS: 04/27/19 Procedure: Partial phalangectomy left great toe, incision and drainage bone biopsy and abx beads left  44 y.o. male presents with the above complaint. History confirmed with patient.   Objective:  Physical Exam: no tenderness at the surgical site, local edema noted and calf supple, nontender. Incision: healing well, no significant drainage, no dehiscence  No images are attached to the encounter.  Radiographs: X-ray of the left foot: consistent with post-op state   Assessment:   1. Post-operative state     Plan:  Patient was evaluated and treated and all questions answered.  Post-operative State -XR reviewed with patient -Dressing applied consisting of sterile gauze, kerlix and coban -WBAT in Surgical shoe  -F/u in 1 week for recheck.  No follow-ups on file.

## 2019-05-12 DIAGNOSIS — N189 Chronic kidney disease, unspecified: Secondary | ICD-10-CM | POA: Diagnosis not present

## 2019-05-12 DIAGNOSIS — I129 Hypertensive chronic kidney disease with stage 1 through stage 4 chronic kidney disease, or unspecified chronic kidney disease: Secondary | ICD-10-CM | POA: Diagnosis not present

## 2019-05-12 DIAGNOSIS — N1832 Chronic kidney disease, stage 3b: Secondary | ICD-10-CM | POA: Diagnosis not present

## 2019-05-12 DIAGNOSIS — N2581 Secondary hyperparathyroidism of renal origin: Secondary | ICD-10-CM | POA: Diagnosis not present

## 2019-05-12 DIAGNOSIS — E1122 Type 2 diabetes mellitus with diabetic chronic kidney disease: Secondary | ICD-10-CM | POA: Diagnosis not present

## 2019-05-16 ENCOUNTER — Other Ambulatory Visit: Payer: Self-pay | Admitting: Nephrology

## 2019-05-16 ENCOUNTER — Other Ambulatory Visit: Payer: Self-pay

## 2019-05-16 ENCOUNTER — Encounter: Payer: BC Managed Care – PPO | Admitting: Podiatry

## 2019-05-16 ENCOUNTER — Ambulatory Visit (INDEPENDENT_AMBULATORY_CARE_PROVIDER_SITE_OTHER): Payer: BC Managed Care – PPO | Admitting: Podiatry

## 2019-05-16 DIAGNOSIS — N189 Chronic kidney disease, unspecified: Secondary | ICD-10-CM

## 2019-05-16 DIAGNOSIS — N2581 Secondary hyperparathyroidism of renal origin: Secondary | ICD-10-CM

## 2019-05-16 DIAGNOSIS — N1832 Chronic kidney disease, stage 3b: Secondary | ICD-10-CM

## 2019-05-16 DIAGNOSIS — I129 Hypertensive chronic kidney disease with stage 1 through stage 4 chronic kidney disease, or unspecified chronic kidney disease: Secondary | ICD-10-CM

## 2019-05-16 DIAGNOSIS — E1122 Type 2 diabetes mellitus with diabetic chronic kidney disease: Secondary | ICD-10-CM

## 2019-05-16 DIAGNOSIS — Z9889 Other specified postprocedural states: Secondary | ICD-10-CM

## 2019-05-16 DIAGNOSIS — Z91199 Patient's noncompliance with other medical treatment and regimen due to unspecified reason: Secondary | ICD-10-CM

## 2019-05-16 DIAGNOSIS — Z9119 Patient's noncompliance with other medical treatment and regimen: Secondary | ICD-10-CM

## 2019-05-16 DIAGNOSIS — D631 Anemia in chronic kidney disease: Secondary | ICD-10-CM

## 2019-05-16 MED ORDER — OXYCODONE-ACETAMINOPHEN 5-325 MG PO TABS: 1.0000 | ORAL_TABLET | ORAL | 0 refills | Status: DC | PRN

## 2019-05-16 NOTE — Progress Notes (Signed)
  Subjective:  Patient ID: Barry [name], male    DOB: April 20, 1975,  MRN: [id number]  Chief Complaint  Patient presents with  . Routine Post Op    POV Pt. states,"; w/ mor epain since I ran out of pain meds.' tx; none -completed abx   . Diabetes    FBS: 168    DOS: 04/27/19 Procedure: Partial phalangectomy left great toe, incision and drainage bone biopsy and abx beads left  44 y.o. male presents with the above complaint. History confirmed with patient. Not wearing his surgical shoe today has normal sneaker on.   Objective:  Physical Exam: no tenderness at the surgical site, local edema noted and calf supple, nontender. Incision: healing well, edema today  Assessment:   1. Post-operative state     Plan:  Patient was evaluated and treated and all questions answered.  Post-operative State -All but central staples removed. -Discussed importance of wearing surgical shoe. Toe more edematous today.  Return in about 1 week (around 05/23/2019) for Post-Op (No XRs).

## 2019-05-18 ENCOUNTER — Ambulatory Visit
Admission: RE | Admit: 2019-05-18 | Discharge: 2019-05-18 | Disposition: A | Discharge status: Home / Self Care | Payer: BC Managed Care – PPO | Source: Ambulatory Visit | Attending: Nephrology | Admitting: Nephrology

## 2019-05-18 DIAGNOSIS — N1832 Chronic kidney disease, stage 3b: Secondary | ICD-10-CM

## 2019-05-18 DIAGNOSIS — I129 Hypertensive chronic kidney disease with stage 1 through stage 4 chronic kidney disease, or unspecified chronic kidney disease: Secondary | ICD-10-CM

## 2019-05-18 DIAGNOSIS — N2581 Secondary hyperparathyroidism of renal origin: Secondary | ICD-10-CM

## 2019-05-18 DIAGNOSIS — N189 Chronic kidney disease, unspecified: Secondary | ICD-10-CM

## 2019-05-18 DIAGNOSIS — N183 Chronic kidney disease, stage 3 unspecified: Secondary | ICD-10-CM | POA: Diagnosis not present

## 2019-05-18 DIAGNOSIS — E1122 Type 2 diabetes mellitus with diabetic chronic kidney disease: Secondary | ICD-10-CM

## 2019-05-18 IMAGING — US US RENAL
1 series · 14 of 25 positions shown · non-contrast
Comparison: None.

CLINICAL DATA: Chronic kidney disease, stage III B.

EXAM:
RENAL / URINARY TRACT ULTRASOUND COMPLETE

[Series 1: us renal · 0.25mm/px · 14 of 30 slices shown]
[im 1/30]
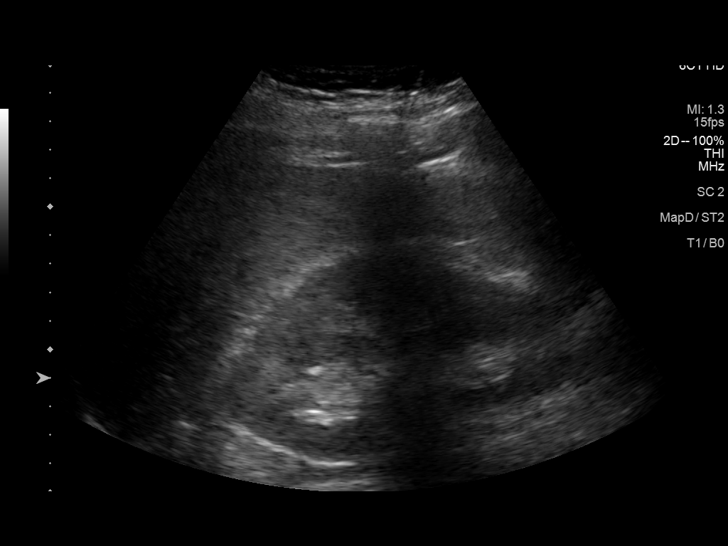
[im 3/30]
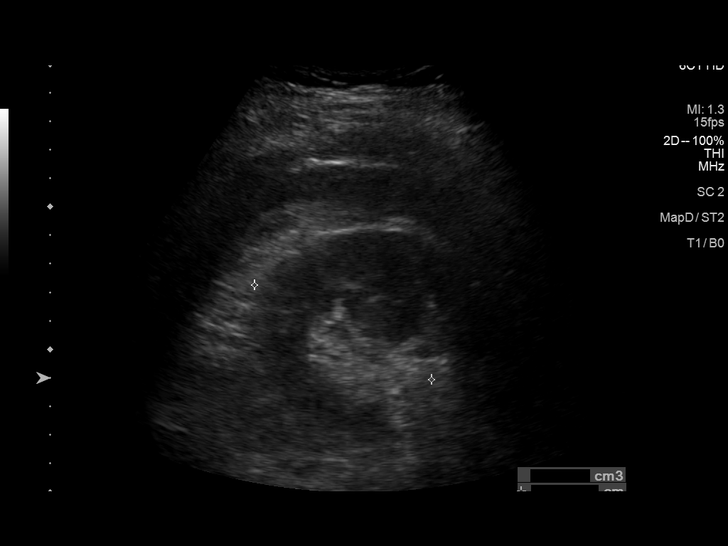
[im 5/30]
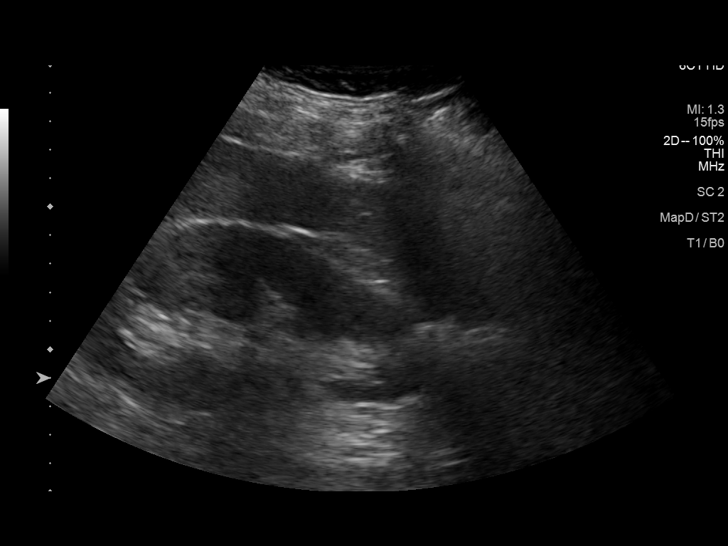
[im 8/30]
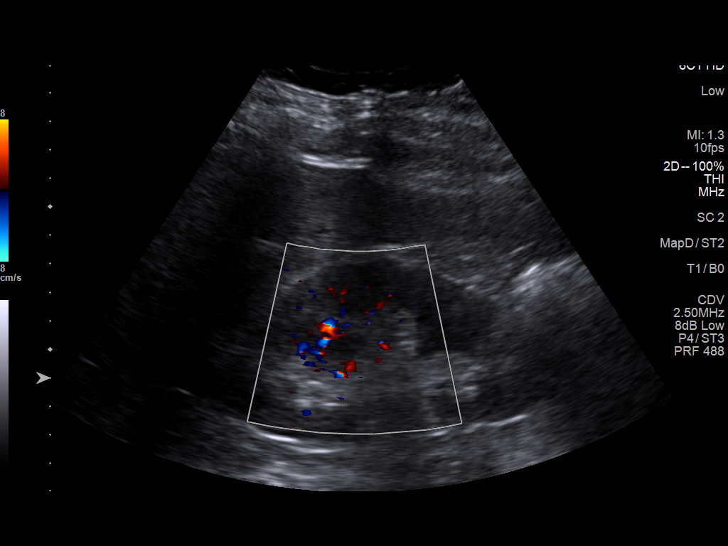
[im 10/30]
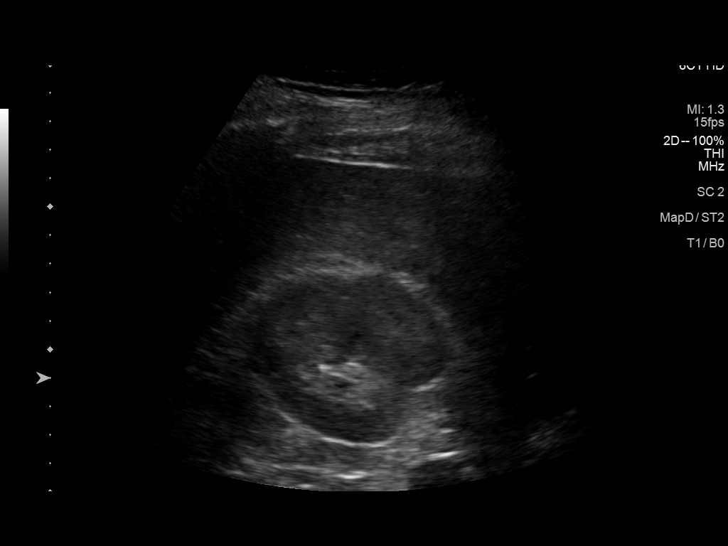
[im 11/30]
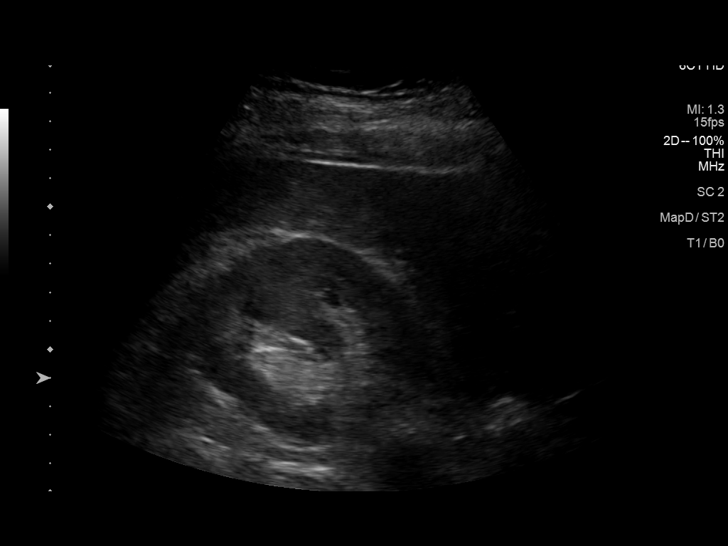
[im 14/30]
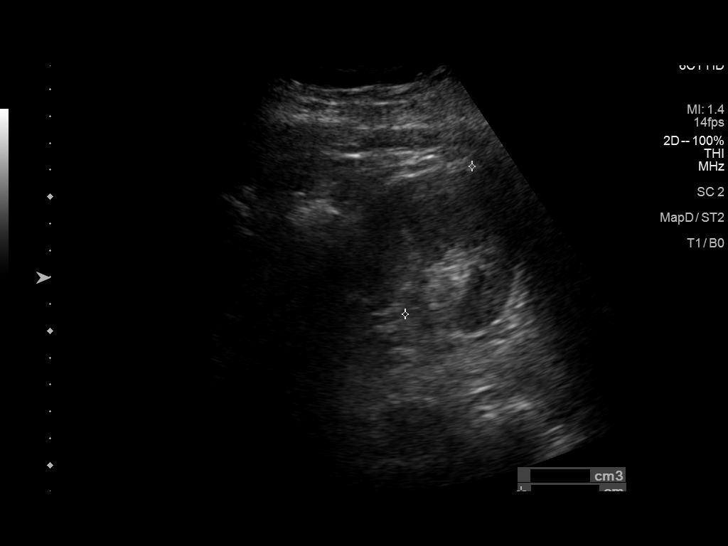
[im 16/30]
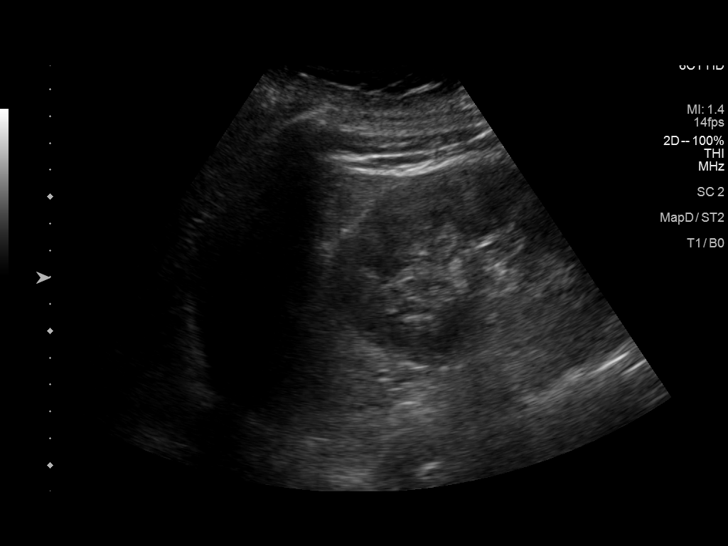
[im 19/30]
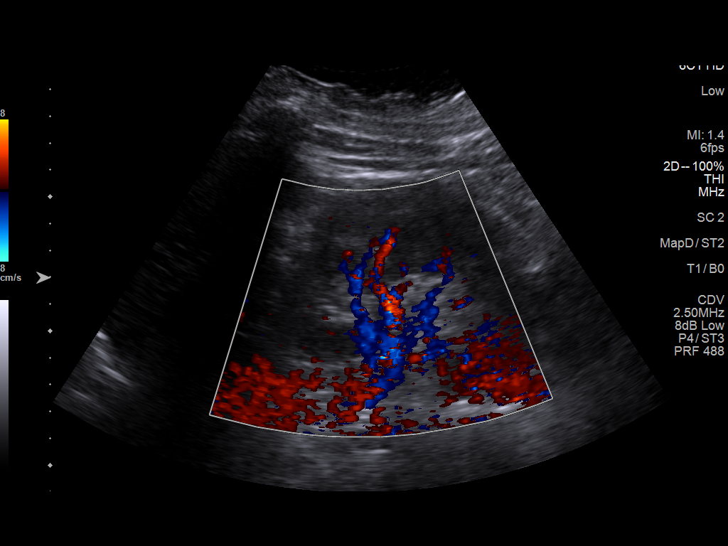
[im 20/30]
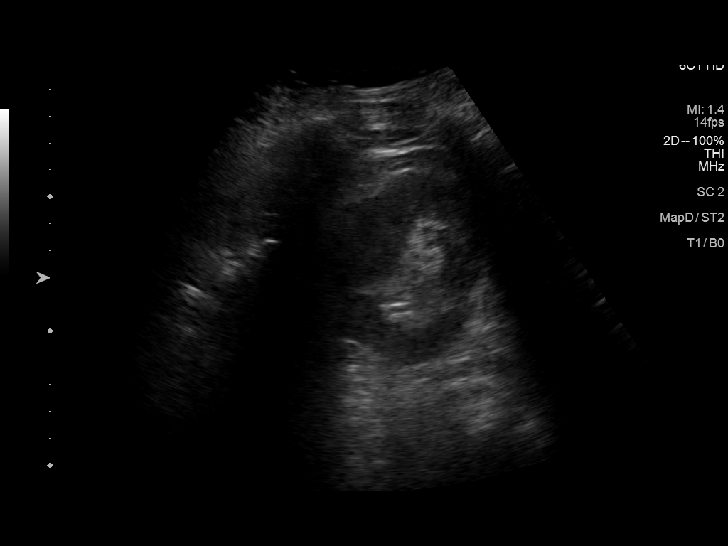
[im 22/30]
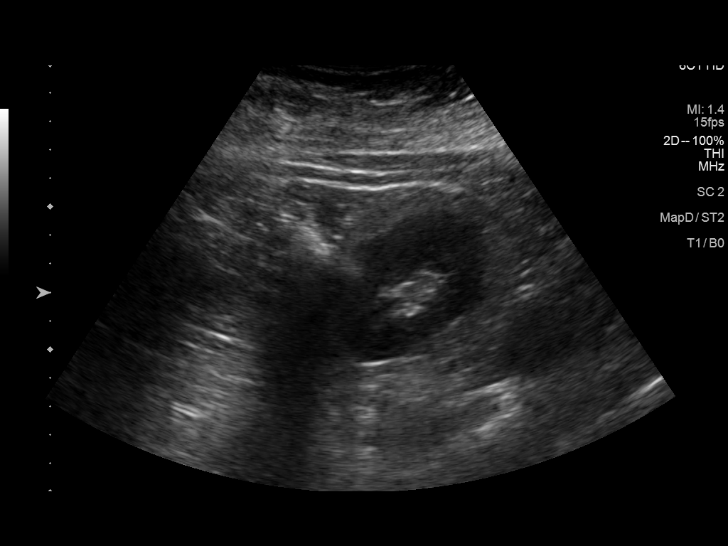
[im 25/30]
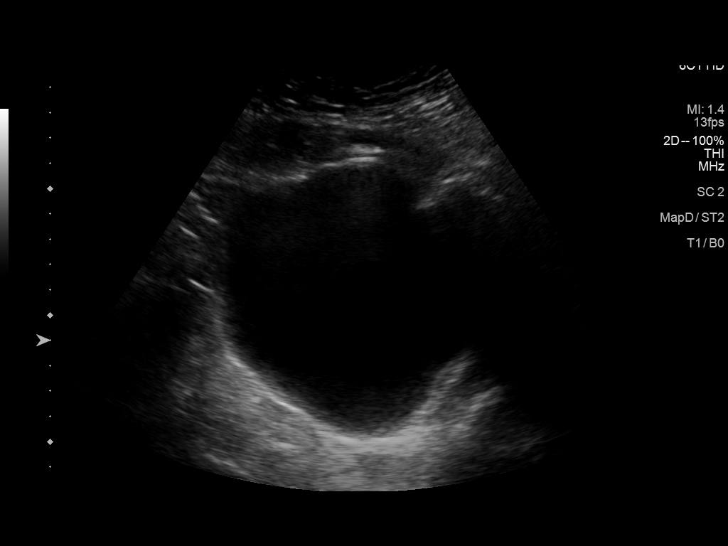
[im 27/30]
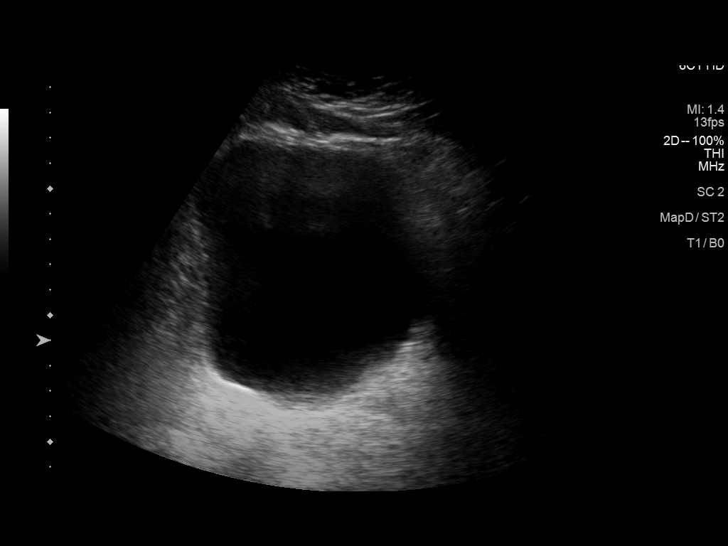
[im 30/30]
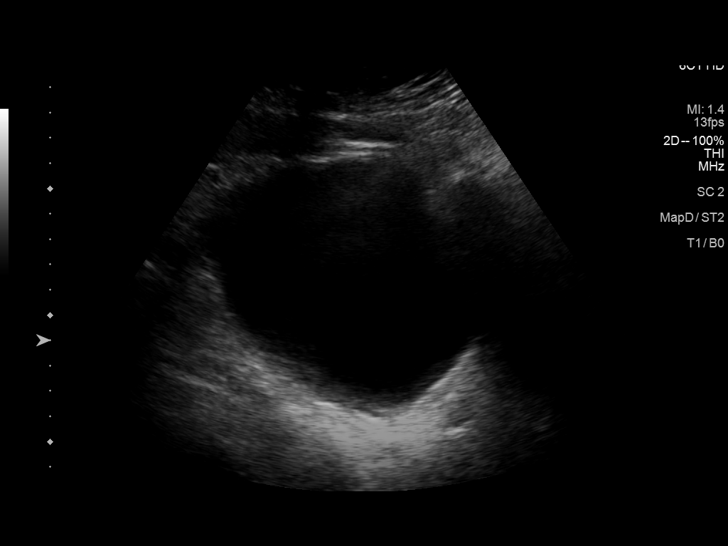

[14 of 25 positions shown; findings below may reference images not displayed]

FINDINGS: Right Kidney:

Renal measurements: 12.7 x 6.3 x 7.0 cm = volume: 294 mL .
Echogenicity within normal limits. No mass or hydronephrosis
visualized.

Left Kidney:

Renal measurements: 11.6 x 6.6 x 6.1 cm = volume: 243 mL.
Echogenicity within normal limits. No mass or hydronephrosis
visualized.

Bladder:

Appears normal for degree of bladder distention.

Other:

None.
IMPRESSION: Normal size and appearance of both kidneys. No evidence of
hydronephrosis.

## 2019-05-23 ENCOUNTER — Ambulatory Visit (INDEPENDENT_AMBULATORY_CARE_PROVIDER_SITE_OTHER): Payer: BC Managed Care – PPO | Admitting: Podiatry

## 2019-05-23 ENCOUNTER — Other Ambulatory Visit: Payer: Self-pay

## 2019-05-23 DIAGNOSIS — Z9889 Other specified postprocedural states: Secondary | ICD-10-CM

## 2019-05-27 DIAGNOSIS — E039 Hypothyroidism, unspecified: Secondary | ICD-10-CM | POA: Diagnosis not present

## 2019-05-27 DIAGNOSIS — E785 Hyperlipidemia, unspecified: Secondary | ICD-10-CM | POA: Diagnosis not present

## 2019-05-27 DIAGNOSIS — M109 Gout, unspecified: Secondary | ICD-10-CM | POA: Diagnosis not present

## 2019-05-27 DIAGNOSIS — E1169 Type 2 diabetes mellitus with other specified complication: Secondary | ICD-10-CM | POA: Diagnosis not present

## 2019-05-27 DIAGNOSIS — Z0000 Encounter for general adult medical examination without abnormal findings: Secondary | ICD-10-CM | POA: Diagnosis not present

## 2019-05-27 DIAGNOSIS — Z6841 Body Mass Index (BMI) 40.0 and over, adult: Secondary | ICD-10-CM | POA: Diagnosis not present

## 2019-05-27 DIAGNOSIS — Z125 Encounter for screening for malignant neoplasm of prostate: Secondary | ICD-10-CM | POA: Diagnosis not present

## 2019-05-27 DIAGNOSIS — Z23 Encounter for immunization: Secondary | ICD-10-CM | POA: Diagnosis not present

## 2019-06-06 DIAGNOSIS — E785 Hyperlipidemia, unspecified: Secondary | ICD-10-CM | POA: Diagnosis not present

## 2019-06-06 DIAGNOSIS — E1169 Type 2 diabetes mellitus with other specified complication: Secondary | ICD-10-CM | POA: Diagnosis not present

## 2019-06-06 DIAGNOSIS — I10 Essential (primary) hypertension: Secondary | ICD-10-CM | POA: Diagnosis not present

## 2019-06-06 DIAGNOSIS — M109 Gout, unspecified: Secondary | ICD-10-CM | POA: Diagnosis not present

## 2019-06-07 ENCOUNTER — Other Ambulatory Visit: Payer: Self-pay

## 2019-06-07 ENCOUNTER — Ambulatory Visit (INDEPENDENT_AMBULATORY_CARE_PROVIDER_SITE_OTHER): Payer: BC Managed Care – PPO | Admitting: Podiatry

## 2019-06-07 ENCOUNTER — Encounter: Payer: Self-pay | Admitting: Podiatry

## 2019-06-07 DIAGNOSIS — Z9889 Other specified postprocedural states: Secondary | ICD-10-CM

## 2019-06-17 ENCOUNTER — Telehealth: Payer: Self-pay

## 2019-06-17 NOTE — Telephone Encounter (Signed)
Pt called stating he is having severe foot pain and would like a RF on pain medication

## 2019-06-21 MED ORDER — HYDROCODONE-ACETAMINOPHEN 5-325 MG PO TABS: 1.0000 | ORAL_TABLET | Freq: Four times a day (QID) | ORAL | 0 refills | Status: DC | PRN

## 2019-06-21 NOTE — Addendum Note (Signed)
Addended by: Ventura Sellers on: 06/21/2019 07:19 AM   Modules accepted: Orders

## 2019-07-06 DIAGNOSIS — Z23 Encounter for immunization: Secondary | ICD-10-CM | POA: Diagnosis not present

## 2019-07-11 ENCOUNTER — Encounter: Payer: BC Managed Care – PPO | Admitting: Podiatry

## 2019-07-12 ENCOUNTER — Ambulatory Visit (INDEPENDENT_AMBULATORY_CARE_PROVIDER_SITE_OTHER): Payer: BC Managed Care – PPO | Admitting: Podiatry

## 2019-07-12 DIAGNOSIS — Z5329 Procedure and treatment not carried out because of patient's decision for other reasons: Secondary | ICD-10-CM

## 2019-07-12 NOTE — Progress Notes (Signed)
No show for appt. 

## 2019-08-03 NOTE — Progress Notes (Signed)
  Subjective:  Patient ID: Barry [name], male    DOB: 1975-06-13,  MRN: [id number]  No chief complaint on file.   44 y.o. male presents for hospital follow-up.  Underwent open reduction percutaneous pinning for left hallux fracture in addition to incision and drainage for deep space infection.  Objective:  Physical Exam: tenderness at the surgical site, local edema noted and calf supple, nontender. Incision: healing well, no significant drainage, no dehiscence, no significant erythema   Radiographs: X-ray of the left foot: Interval displacement of the fracture fragment  Assessment:   1. Capsulitis of toe, left     Plan:  Patient was evaluated and treated and all questions answered.  Post-operative State -X-rays reviewed with patient.  Discussed that the fracture appears to have displaced discussed the importance of compliance with weightbearing status -Dressed with Betadine wet-to-dry -Discussed he would benefit from relocation versus excision of the fracture fragment with repeat debridement.  Consent form reviewed and signed by patient  No follow-ups on file.

## 2019-08-03 NOTE — Progress Notes (Signed)
  Subjective:  Patient ID: Barry [name], male    DOB: 07-23-1975,  MRN: [id number]  No chief complaint on file.   44 y.o. male presents for hospital follow-up.  States the pin is loosening.  Having a little pain here and there with swelling and drainage.  Objective:  Physical Exam: tenderness at the surgical site, local edema noted and calf supple, nontender. Incision: healing well, scant drainage, no dehiscence, no significant erythema  Assessment:   1. Capsulitis of toe, left   2. Post-operative state     Plan:  Patient was evaluated and treated and all questions answered.  Post-operative State -Pending planned surgery for reduction of the fracture fragment versus excision of the fractured fragment with debridement of the bone -Dressed with Betadine wet-to-dry -Refill amoxicillin and oxycodone   No follow-ups on file.

## 2019-08-03 NOTE — Progress Notes (Signed)
  Subjective:  Patient ID: Barry [name], male    DOB: 1975-08-05,  MRN: [id number]  No chief complaint on file.   DOS: 04/27/19 Procedure: Partial phalangectomy left great toe, incision and drainage bone biopsy and abx beads left  44 y.o. male presents for f/u. Denies new complaints.  Objective:  Physical Exam: no tenderness at the surgical site, local edema noted and calf supple, nontender. Incision: healing well, edema today  Assessment:   1. Post-operative state     Plan:  Patient was evaluated and treated and all questions answered.  Post-operative State -Remaining staples removed. -WBAT in surgical shoe -OK to shower.  No follow-ups on file.

## 2019-08-03 NOTE — Progress Notes (Signed)
  Subjective:  Patient ID: Barry [name], male    DOB: 1975/12/08,  MRN: [id number]  Chief Complaint  Patient presents with  . Routine Post Op    POV Pt states," doing alrihgt, but the skin issue has come back and it's spreading again." -w/ swellign Tx: sx shoe    DOS: 04/27/19 Procedure: Partial phalangectomy left great toe, incision and drainage bone biopsy and abx beads left  44 y.o. male presents with the above complaint. History confirmed with patient.  Objective:  Physical Exam: no tenderness at the surgical site, local edema noted and calf supple, nontender. Incision: healing well, edema today  Assessment:   1. Post-operative state     Plan:  Patient was evaluated and treated and all questions answered.  Post-operative State -Remaining staples removed. -Transition to normal shoe gear as tolerated -Continue steroid cream for skin irritation  Return in about 6 weeks (around 07/19/2019).

## 2019-08-03 NOTE — Progress Notes (Signed)
  Subjective:  Patient ID: Zayn Selley, male    DOB: 1975/06/15,  MRN: [id number]  Chief Complaint  Patient presents with  . Post-op Problem    pt c.o screw at Lt hallux spinning around adn that he lost a staple -w/ burning and pian (5-9/10) -w/ swellgin Tx: boot and PRN meds    44 y.o. male presents for hospital follow-up. Hx above confirmed with patient.  Objective:  Physical Exam: tenderness at the surgical site, local edema noted and calf supple, nontender. Incision: healing well, no significant drainage, no dehiscence, no significant erythema  Assessment:   1. Capsulitis of toe, left   2. Post-operative state     Plan:  Patient was evaluated and treated and all questions answered.  Post-operative State -Pending surgery.  Awaiting potassium to be acceptable for surgery -Dressed with Betadine wet-to-dry  No follow-ups on file.

## 2019-08-22 DIAGNOSIS — N2581 Secondary hyperparathyroidism of renal origin: Secondary | ICD-10-CM | POA: Diagnosis not present

## 2019-08-22 DIAGNOSIS — I129 Hypertensive chronic kidney disease with stage 1 through stage 4 chronic kidney disease, or unspecified chronic kidney disease: Secondary | ICD-10-CM | POA: Diagnosis not present

## 2019-08-22 DIAGNOSIS — N1832 Chronic kidney disease, stage 3b: Secondary | ICD-10-CM | POA: Diagnosis not present

## 2019-08-22 DIAGNOSIS — D631 Anemia in chronic kidney disease: Secondary | ICD-10-CM | POA: Diagnosis not present

## 2019-08-23 ENCOUNTER — Other Ambulatory Visit: Payer: Self-pay

## 2019-08-23 ENCOUNTER — Ambulatory Visit (INDEPENDENT_AMBULATORY_CARE_PROVIDER_SITE_OTHER): Payer: BC Managed Care – PPO | Admitting: Sports Medicine

## 2019-08-23 ENCOUNTER — Encounter: Payer: Self-pay | Admitting: Sports Medicine

## 2019-08-23 DIAGNOSIS — L309 Dermatitis, unspecified: Secondary | ICD-10-CM

## 2019-08-23 DIAGNOSIS — M792 Neuralgia and neuritis, unspecified: Secondary | ICD-10-CM

## 2019-08-23 DIAGNOSIS — Z9889 Other specified postprocedural states: Secondary | ICD-10-CM

## 2019-08-23 DIAGNOSIS — M79672 Pain in left foot: Secondary | ICD-10-CM | POA: Diagnosis not present

## 2019-08-23 MED ORDER — TRIAMCINOLONE ACETONIDE 0.5 % EX OINT: 1.0000 "application " | TOPICAL_OINTMENT | Freq: Two times a day (BID) | CUTANEOUS | 0 refills | Status: DC

## 2019-08-23 MED ORDER — PREGABALIN 75 MG PO CAPS: 75.0000 mg | ORAL_CAPSULE | Freq: Two times a day (BID) | ORAL | 2 refills | Status: DC

## 2019-08-23 NOTE — Progress Notes (Signed)
Subjective: Barry [name] is a 44 y.o. male patient seen today in office for postoperative check.  Patient is status post left hallux phalangectomy and placement of antibiotic beads performed by Dr. Samuella Cota on 04/27/2019.  Patient reports that he has been having shooting pains that starts before the end of his shift is over at the left big toe.  Patient reports that he never got the steroid cream that was sent for his skin changes on the top of the left foot and never saw the dermatologist reports that he has been wearing a normal shoe to work and has been experiencing some increased tingling as well as shooting pain as above.  Patient denies nausea vomiting fever chills or any other constitutional symptoms at this time.  No other issues noted.   Patient Active Problem List   Diagnosis Date Noted  . AKI (acute kidney injury) (HCC) 08/05/2018  . Stage 3 chronic kidney disease 08/05/2018  . 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 Pneumonia due to COVID-19 virus 08/04/2018  . Pain in joint of left elbow 07/02/2018  . Pain in left knee 07/02/2018  . Pain of left hand 03/20/2018  . Thoracic radiculopathy 05/01/2015  . Chronic allergic conjunctivitis 04/20/2015  . Congenital cataract 04/20/2015  . Diabetic cataract (HCC) 04/20/2015  . Dry eyes, bilateral 04/20/2015  . Myopia of both eyes 04/20/2015  . Paronychia 04/20/2015  . Carpal tunnel syndrome, left 03/29/2015  . Dysuria 03/21/2015  . Arm numbness left 03/20/2015  . Diabetic neuropathy (HCC) 03/20/2015  . Neck pain 03/20/2015  . Hypogonadism male 03/19/2015  . Morbid obesity with BMI of 40.0-44.9, adult (HCC) 03/19/2015  . Burning sensation of feet 03/16/2015  . Common migraine 03/16/2015  . Degenerative disc disease, lumbar 03/16/2015  . Depression 03/16/2015  . Hypertension 03/16/2015  . Low back pain 03/16/2015  . OSA (obstructive sleep apnea) 03/16/2015  . 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 HYPOTHYROIDISM 05/31/2008  . DYSLIPIDEMIA 05/31/2008  . OBESITY, MORBID 05/31/2008  . MYOCARDIAL PERFUSION SCAN, WITH STRESS TEST, ABNORMAL 05/31/2008  . Dyslipidemia 05/31/2008    Current Outpatient Medications on File Prior to Visit  Medication Sig Dispense Refill  . amoxicillin (AMOXIL) 500 MG capsule Take 1 capsule (500 mg total) by mouth 3 (three) times daily. 21 capsule 0  . atorvastatin (LIPITOR) 80 MG tablet Take 80 mg by mouth daily.     Marland Kitchen azithromycin (ZITHROMAX) 250 MG tablet Take 1 tablet (250 mg total) by mouth daily. Take first 2 tablets together, then 1 every day until finished. (Patient not taking: Reported on 12/17/2018) 6 tablet 0  . B Complex Vitamins (B COMPLEX PO) Take 1 tablet by mouth daily.    . cholecalciferol (VITAMIN D3) 25 MCG (1000 UNIT) tablet Take 2,000 Units by mouth daily.    . fluticasone (FLONASE) 50 MCG/ACT nasal spray Place 2 sprays into both nostrils 2 (two) times daily as needed for allergies or rhinitis.    Marland Kitchen HUMALOG MIX 75/25 (75-25) 100 UNIT/ML SUSP injection Inject 100 Units into the skin 2 (two) times a day.    Marland Kitchen HYDROcodone-acetaminophen (NORCO/VICODIN) 5-325 MG tablet Take 1 tablet by mouth every 6 (six) hours as needed for severe pain. 12 tablet 0  . JARDIANCE 25 MG TABS tablet Take 25 mg by mouth daily.    Marland Kitchen levofloxacin (LEVAQUIN) 500 MG  tablet Take 1 tablet (500 mg total) by mouth daily. 10 tablet 0  . levothyroxine (SYNTHROID, LEVOTHROID) 75 MCG tablet Take 75 mcg by mouth at bedtime.     Marland Kitchen lisinopril-hydrochlorothiazide (ZESTORETIC) 10-12.5 MG tablet Take 1 tablet by mouth 2 (two) times daily.    . metFORMIN (GLUCOPHAGE) 500 MG tablet Take 1,000 mg by mouth 2 (two) times daily with a meal.    . miconazole (MICOTIN) 2 % cream Apply 1 application topically 2 (two) times daily as needed (yeast infection due  to antibiotic).    . Multiple Vitamin (MULTIVITAMIN WITH MINERALS) TABS tablet Take 1 tablet by mouth daily.    . Multiple Vitamins-Minerals (HAIR SKIN NAILS PO) Take 1 tablet by mouth daily.    . Multiple Vitamins-Minerals (PRESERVISION AREDS PO) Take 1 tablet by mouth daily.    Marland Kitchen oxyCODONE (OXY IR/ROXICODONE) 5 MG immediate release tablet Take 1-2 tablets (5-10 mg total) by mouth every 4 (four) hours as needed for moderate pain. 20 tablet 0  . oxyCODONE-acetaminophen (PERCOCET) 5-325 MG tablet Take 1 tablet by mouth every 4 (four) hours as needed for severe pain. 20 tablet 0   No current facility-administered medications on file prior to visit.    No Known Allergies  Objective: There were no vitals filed for this visit.  General: No acute distress, AAOx3  Left foot: Surgical site well-healed at left great toe mild swelling to left great toe, no erythema, no warmth, no drainage, diffuse dry skin and scaly patches noted to the right foot, no signs of infection noted, Capillary fill time <5 seconds in all digits, gross sensation present via light touch to left foot with subjective sharp shooting pains over the left great toe, no pain or crepitation with range of motion left foot but there is mild guarding.  No pain with calf compression.    Assessment and Plan:  Problem List Items Addressed This Visit    None    Visit Diagnoses    Post-operative state    -  Primary   Left foot pain       Neuritis       Dermatitis           -Patient seen and evaluated -Prescribed Lyrica for neuritis patient had this before when he was seeing the neurologist to see if this will help with any sharp shooting pains that he is having to the left surgical foot -Prescribed triamcinolone cream for patient to use for dermatitis at the top of the right foot and advised patient to notify office if fails to continue to improve for [place] to refer him to dermatology however at this time recommend him to try the cream  first -Advised good supportive shoes daily for foot type -Advised patient that swelling sometimes can linger around upwards to 1 year however we did dispensed to him today a Surgigrip compression sleeve to use while at work to assist with edema control -Patient to return to office in 2 months for final postoperative check with Dr. Samuella Cota.  Asencion Islam, DPM

## 2019-09-10 DIAGNOSIS — M25562 Pain in left knee: Secondary | ICD-10-CM | POA: Diagnosis not present

## 2019-09-10 DIAGNOSIS — E119 Type 2 diabetes mellitus without complications: Secondary | ICD-10-CM | POA: Diagnosis not present

## 2019-09-10 DIAGNOSIS — M25561 Pain in right knee: Secondary | ICD-10-CM | POA: Diagnosis not present

## 2019-09-10 DIAGNOSIS — Z8739 Personal history of other diseases of the musculoskeletal system and connective tissue: Secondary | ICD-10-CM | POA: Diagnosis not present

## 2019-09-10 DIAGNOSIS — M109 Gout, unspecified: Secondary | ICD-10-CM | POA: Diagnosis not present

## 2019-09-10 DIAGNOSIS — M25462 Effusion, left knee: Secondary | ICD-10-CM | POA: Diagnosis not present

## 2019-10-26 DIAGNOSIS — S8000XA Contusion of unspecified knee, initial encounter: Secondary | ICD-10-CM | POA: Diagnosis not present

## 2019-10-26 DIAGNOSIS — W228XXA Striking against or struck by other objects, initial encounter: Secondary | ICD-10-CM | POA: Diagnosis not present

## 2019-10-26 DIAGNOSIS — M1710 Unilateral primary osteoarthritis, unspecified knee: Secondary | ICD-10-CM | POA: Diagnosis not present

## 2019-10-26 DIAGNOSIS — M1711 Unilateral primary osteoarthritis, right knee: Secondary | ICD-10-CM | POA: Diagnosis not present

## 2019-10-26 DIAGNOSIS — S8001XA Contusion of right knee, initial encounter: Secondary | ICD-10-CM | POA: Diagnosis not present

## 2019-10-26 DIAGNOSIS — M25561 Pain in right knee: Secondary | ICD-10-CM | POA: Diagnosis not present

## 2019-10-26 DIAGNOSIS — S8991XA Unspecified injury of right lower leg, initial encounter: Secondary | ICD-10-CM | POA: Diagnosis not present

## 2019-10-27 ENCOUNTER — Encounter: Payer: BC Managed Care – PPO | Admitting: Podiatry

## 2019-12-12 DIAGNOSIS — E113292 Type 2 diabetes mellitus with mild nonproliferative diabetic retinopathy without macular edema, left eye: Secondary | ICD-10-CM | POA: Diagnosis not present

## 2019-12-12 DIAGNOSIS — E039 Hypothyroidism, unspecified: Secondary | ICD-10-CM | POA: Diagnosis not present

## 2019-12-12 DIAGNOSIS — E1142 Type 2 diabetes mellitus with diabetic polyneuropathy: Secondary | ICD-10-CM | POA: Diagnosis not present

## 2019-12-12 DIAGNOSIS — N5314 Retrograde ejaculation: Secondary | ICD-10-CM | POA: Insufficient documentation

## 2019-12-12 DIAGNOSIS — Z794 Long term (current) use of insulin: Secondary | ICD-10-CM | POA: Diagnosis not present

## 2020-01-19 DIAGNOSIS — E1142 Type 2 diabetes mellitus with diabetic polyneuropathy: Secondary | ICD-10-CM | POA: Diagnosis not present

## 2020-01-19 DIAGNOSIS — Z6841 Body Mass Index (BMI) 40.0 and over, adult: Secondary | ICD-10-CM | POA: Diagnosis not present

## 2020-01-19 DIAGNOSIS — G5603 Carpal tunnel syndrome, bilateral upper limbs: Secondary | ICD-10-CM | POA: Diagnosis not present

## 2020-01-19 DIAGNOSIS — Z113 Encounter for screening for infections with a predominantly sexual mode of transmission: Secondary | ICD-10-CM | POA: Diagnosis not present

## 2020-01-19 DIAGNOSIS — G4733 Obstructive sleep apnea (adult) (pediatric): Secondary | ICD-10-CM | POA: Diagnosis not present

## 2020-01-19 DIAGNOSIS — Z794 Long term (current) use of insulin: Secondary | ICD-10-CM | POA: Diagnosis not present

## 2020-05-12 DIAGNOSIS — K8020 Calculus of gallbladder without cholecystitis without obstruction: Secondary | ICD-10-CM | POA: Diagnosis not present

## 2020-05-12 DIAGNOSIS — E1165 Type 2 diabetes mellitus with hyperglycemia: Secondary | ICD-10-CM | POA: Diagnosis not present

## 2020-05-12 DIAGNOSIS — M25462 Effusion, left knee: Secondary | ICD-10-CM | POA: Diagnosis not present

## 2020-05-12 DIAGNOSIS — R1011 Right upper quadrant pain: Secondary | ICD-10-CM | POA: Diagnosis not present

## 2020-05-12 DIAGNOSIS — K760 Fatty (change of) liver, not elsewhere classified: Secondary | ICD-10-CM | POA: Diagnosis not present

## 2020-09-18 DIAGNOSIS — M79671 Pain in right foot: Secondary | ICD-10-CM

## 2020-09-18 DIAGNOSIS — E11621 Type 2 diabetes mellitus with foot ulcer: Secondary | ICD-10-CM

## 2020-09-20 ENCOUNTER — Telehealth: Payer: Self-pay | Admitting: Urology

## 2020-09-20 NOTE — Telephone Encounter (Signed)
Pt called he is being dc from hospital today and I made him a fu appt with you on 9/7, he stated that MRI was not able to be done in the hospital and wants to know if it needs to be done out patient. Please Advise.

## 2020-09-26 ENCOUNTER — Encounter: Payer: Self-pay | Admitting: Sports Medicine

## 2020-09-26 ENCOUNTER — Ambulatory Visit (INDEPENDENT_AMBULATORY_CARE_PROVIDER_SITE_OTHER): Payer: Self-pay | Admitting: Sports Medicine

## 2020-09-26 ENCOUNTER — Other Ambulatory Visit: Payer: Self-pay

## 2020-09-26 DIAGNOSIS — M79676 Pain in unspecified toe(s): Secondary | ICD-10-CM

## 2020-09-26 DIAGNOSIS — L97512 Non-pressure chronic ulcer of other part of right foot with fat layer exposed: Secondary | ICD-10-CM

## 2020-09-26 DIAGNOSIS — M86171 Other acute osteomyelitis, right ankle and foot: Secondary | ICD-10-CM

## 2020-09-26 DIAGNOSIS — M79671 Pain in right foot: Secondary | ICD-10-CM

## 2020-09-26 DIAGNOSIS — E1142 Type 2 diabetes mellitus with diabetic polyneuropathy: Secondary | ICD-10-CM

## 2020-09-26 NOTE — Progress Notes (Addendum)
Subjective: Barry [name] is a 45 y.o. male patient seen in office for evaluation of ulceration of the right foot. Patient has a history of diabetes and a blood glucose level  today not recorded.  Patient is changing the dressing using antibotic cream. Denies nausea/fever/vomiting/chills/night sweats/shortness of breath but does admit pain.  Awaiting MRI. Patient has no other pedal complaints at this time.  Patient Active Problem List   Diagnosis Date Noted   AKI (acute kidney injury) ([place]) 08/05/2018   Stage 3 chronic kidney disease ([place]) 08/05/2018   Hyperosmolar non-ketotic state in patient with type 2 diabetes mellitus ([place]) 08/04/2018   Pneumonia due to COVID-19 virus 08/04/2018   Pain in joint of left elbow 07/02/2018   Pain in left knee 07/02/2018   Pain of left hand 03/20/2018   Thoracic radiculopathy 05/01/2015   Chronic allergic conjunctivitis 04/20/2015   Congenital cataract 04/20/2015   Diabetic cataract ([place]) 04/20/2015   Dry eyes, bilateral 04/20/2015   Myopia of both eyes 04/20/2015   Paronychia 04/20/2015   Carpal tunnel syndrome, left 03/29/2015   Dysuria 03/21/2015   Arm numbness left 03/20/2015   Diabetic neuropathy ([place]) 03/20/2015   Neck pain 03/20/2015   Hypogonadism male 03/19/2015   Morbid obesity with BMI of 40.0-44.9, adult ([place]) 03/19/2015   Burning sensation of feet 03/16/2015   Common migraine 03/16/2015   Degenerative disc disease, lumbar 03/16/2015   Depression 03/16/2015   Hypertension 03/16/2015   Low back pain 03/16/2015   OSA (obstructive sleep apnea) 03/16/2015   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 21 capsule 0   atorvastatin (LIPITOR) 80 MG tablet Take 80 mg by mouth daily.      azithromycin (ZITHROMAX) 250 MG tablet Take 1 tablet (250 mg total) by mouth daily. Take first 2 tablets together, then 1 every day until finished. (Patient not taking: Reported on 12/17/2018) 6 tablet 0   B Complex Vitamins (B COMPLEX PO) Take 1 tablet by mouth daily.     cholecalciferol (VITAMIN D3) 25 MCG (1000 UNIT) tablet Take 2,000 Units by mouth daily.     fluticasone (FLONASE) 50 MCG/ACT nasal spray Place 2 sprays into both nostrils 2 (two) times daily as needed for allergies or rhinitis.     HUMALOG MIX 75/25 (75-25) 100 UNIT/ML SUSP injection Inject 100 Units into the skin 2 (two) times a day.     HYDROcodone-acetaminophen (NORCO/VICODIN) 5-325 MG tablet Take 1 tablet by mouth every 6 (six) hours as needed for severe pain. 12 tablet 0   JARDIANCE 25 MG TABS tablet Take 25 mg by mouth daily.     levofloxacin (LEVAQUIN) 500 MG tablet Take 1 tablet (500 mg total) by mouth daily. 10 tablet 0   levothyroxine (SYNTHROID, LEVOTHROID) 75 MCG tablet Take 75 mcg by mouth at bedtime.      lisinopril-hydrochlorothiazide (ZESTORETIC) 10-12.5 MG tablet Take 1 tablet by mouth 2 (two) times daily.     metFORMIN (GLUCOPHAGE) 500 MG tablet Take 1,000 mg by mouth 2 (two) times daily with a meal.  miconazole (MICOTIN) 2 % cream Apply 1 application topically 2 (two) times daily as needed (yeast infection due to antibiotic).     Multiple Vitamin (MULTIVITAMIN WITH MINERALS) TABS tablet Take 1 tablet by mouth daily.     Multiple Vitamins-Minerals (HAIR SKIN NAILS PO) Take 1 tablet by mouth daily.     Multiple Vitamins-Minerals (PRESERVISION AREDS PO) Take 1 tablet by mouth daily.     oxyCODONE (OXY IR/ROXICODONE) 5 MG immediate release tablet Take 1-2 tablets (5-10 mg total) by  mouth every 4 (four) hours as needed for moderate pain. 20 tablet 0   oxyCODONE-acetaminophen (PERCOCET) 5-325 MG tablet Take 1 tablet by mouth every 4 (four) hours as needed for severe pain. 20 tablet 0   pregabalin (LYRICA) 75 MG capsule Take 1 capsule (75 mg total) by mouth 2 (two) times daily. 60 capsule 2   triamcinolone ointment (KENALOG) 0.5 % Apply 1 application topically 2 (two) times daily. 30 g 0   No current facility-administered medications on file prior to visit.   No Known Allergies  No results found for this or any previous visit (from the past 2160 hour(s)).  Objective: There were no vitals filed for this visit.  General: Patient is awake, alert, oriented x 3 and in no acute distress.  Dermatology: Skin is warm and dry bilateral with a full thickness ulceration present  Right sub met 1. Ulceration measures 1cm x 1 cm x 0.2cm. There is a  Keratotic border with a granular base. The ulceration does not  probe to bone. There is no malodor, no active drainage, resolved erythema, decreasing edema. No acute signs of infection.   Vascular: Dorsalis Pedis pulse = 1/4 Bilateral,  Posterior Tibial pulse = 1/4 Bilateral,  Capillary Fill Time < 5 seconds  Neurologic: Protective sensation severely diminished bilateral  Musculosketal: There is no pain with palpation to ulcerated area however patient has diffuse pain to the plantar forefoot. No pain with compression to calves bilateral.  Hallux deviation with elevation of the first toe right foot.  No results for input(s): GRAMSTAIN, LABORGA in the last 8760 hours.  Assessment and Plan:  Problem List Items Addressed This Visit       Endocrine   Diabetic neuropathy ([place])   Other Visit Diagnoses     Right foot ulcer, with fat layer exposed ([place])    -  Primary   Right foot pain       Osteomyelitis of ankle or foot, acute, right ([place])       Relevant Orders   MR FOOT RIGHT WO CONTRAST          -Examined patient and  discussed the progression of the wound and treatment alternatives. -Xrays reviewed from [facility] which were negative - Excisionally dedbrided ulceration at right plantar forefoot  sub met 1 to healthy bleeding borders removing nonviable tissue using a sterile chisel blade. Wound measures post debridement as above. Wound was debrided to the level of the dermis with viable wound base exposed to promote healing. Hemostasis was achieved with manuel pressure. Patient tolerated procedure well without any discomfort or anesthesia necessary for this wound debridement.  -Applied Betadine to the periwound area and Maxorb to the wound bed and dry sterile dressing and instructed patient to continue with daily dressings at home consisting of same once daily -Advised patient to return to using cam boot that he has at home and to minimize pressure to the forefoot -Rx MRI at [facility] outpatient imaging to evaluate for possible  osteomyelitis however there is very low clinical concern for this but since patient has a history of chronic wound and previous surgery to this area warrants MRI for further evaluation - Advised patient to go to the ER or return to office if the wound worsens or if constitutional symptoms are present. -Leave accommodation paperwork completed for patient -Patient to return to office after MRI for follow up care and evaluation or sooner if problems arise.  Landis Martins, DPM

## 2020-10-04 ENCOUNTER — Telehealth: Payer: Self-pay

## 2020-10-04 NOTE — Telephone Encounter (Signed)
Contacted Pt and LVM stating that his insurance [employer] does not cover MRIs and would be consider a self pay. Approximately self pay would be 1,500 or more.  Pt was advised to call our office if he would like to proceed with the MRI

## 2020-10-09 ENCOUNTER — Encounter: Payer: Self-pay | Admitting: Sports Medicine

## 2020-11-03 DIAGNOSIS — L03115 Cellulitis of right lower limb: Secondary | ICD-10-CM

## 2020-11-03 DIAGNOSIS — E11621 Type 2 diabetes mellitus with foot ulcer: Secondary | ICD-10-CM

## 2020-11-03 DIAGNOSIS — M79671 Pain in right foot: Secondary | ICD-10-CM

## 2020-11-05 DIAGNOSIS — L97511 Non-pressure chronic ulcer of other part of right foot limited to breakdown of skin: Secondary | ICD-10-CM

## 2020-11-12 ENCOUNTER — Encounter: Payer: Self-pay | Admitting: Podiatry

## 2020-11-15 ENCOUNTER — Ambulatory Visit (INDEPENDENT_AMBULATORY_CARE_PROVIDER_SITE_OTHER): Payer: Self-pay | Admitting: Podiatry

## 2020-11-15 ENCOUNTER — Encounter: Payer: Self-pay | Admitting: Podiatry

## 2020-11-15 DIAGNOSIS — Z91199 Patient's noncompliance with other medical treatment and regimen due to unspecified reason: Secondary | ICD-10-CM

## 2021-01-03 DIAGNOSIS — E1165 Type 2 diabetes mellitus with hyperglycemia: Secondary | ICD-10-CM | POA: Diagnosis not present

## 2021-01-03 DIAGNOSIS — L732 Hidradenitis suppurativa: Secondary | ICD-10-CM | POA: Diagnosis not present

## 2021-01-25 DIAGNOSIS — R252 Cramp and spasm: Secondary | ICD-10-CM | POA: Diagnosis not present

## 2021-02-01 DIAGNOSIS — Z794 Long term (current) use of insulin: Secondary | ICD-10-CM | POA: Diagnosis not present

## 2021-02-01 DIAGNOSIS — E1142 Type 2 diabetes mellitus with diabetic polyneuropathy: Secondary | ICD-10-CM | POA: Diagnosis not present

## 2021-02-01 DIAGNOSIS — E11621 Type 2 diabetes mellitus with foot ulcer: Secondary | ICD-10-CM | POA: Diagnosis not present

## 2021-02-01 DIAGNOSIS — N1832 Chronic kidney disease, stage 3b: Secondary | ICD-10-CM | POA: Diagnosis not present

## 2021-02-01 DIAGNOSIS — E1122 Type 2 diabetes mellitus with diabetic chronic kidney disease: Secondary | ICD-10-CM | POA: Diagnosis not present

## 2021-02-13 DIAGNOSIS — M109 Gout, unspecified: Secondary | ICD-10-CM | POA: Diagnosis not present

## 2021-02-13 DIAGNOSIS — N4611 Organic oligospermia: Secondary | ICD-10-CM | POA: Diagnosis not present

## 2021-02-13 DIAGNOSIS — Z1331 Encounter for screening for depression: Secondary | ICD-10-CM | POA: Diagnosis not present

## 2021-02-14 ENCOUNTER — Encounter: Payer: Self-pay | Admitting: Podiatry

## 2021-02-14 ENCOUNTER — Ambulatory Visit: Payer: BC Managed Care – PPO | Admitting: Podiatry

## 2021-02-14 VITALS — Temp 97.6°F

## 2021-02-14 DIAGNOSIS — K8020 Calculus of gallbladder without cholecystitis without obstruction: Secondary | ICD-10-CM | POA: Insufficient documentation

## 2021-02-14 DIAGNOSIS — L02619 Cutaneous abscess of unspecified foot: Secondary | ICD-10-CM | POA: Insufficient documentation

## 2021-02-14 DIAGNOSIS — E871 Hypo-osmolality and hyponatremia: Secondary | ICD-10-CM | POA: Insufficient documentation

## 2021-02-14 DIAGNOSIS — S92401A Displaced unspecified fracture of right great toe, initial encounter for closed fracture: Secondary | ICD-10-CM | POA: Insufficient documentation

## 2021-02-14 DIAGNOSIS — E1110 Type 2 diabetes mellitus with ketoacidosis without coma: Secondary | ICD-10-CM | POA: Insufficient documentation

## 2021-02-14 DIAGNOSIS — J189 Pneumonia, unspecified organism: Secondary | ICD-10-CM | POA: Insufficient documentation

## 2021-02-14 DIAGNOSIS — R0789 Other chest pain: Secondary | ICD-10-CM | POA: Insufficient documentation

## 2021-02-14 DIAGNOSIS — N189 Chronic kidney disease, unspecified: Secondary | ICD-10-CM | POA: Insufficient documentation

## 2021-02-14 DIAGNOSIS — L97512 Non-pressure chronic ulcer of other part of right foot with fat layer exposed: Secondary | ICD-10-CM | POA: Diagnosis not present

## 2021-02-14 DIAGNOSIS — N179 Acute kidney failure, unspecified: Secondary | ICD-10-CM | POA: Insufficient documentation

## 2021-02-14 DIAGNOSIS — K8010 Calculus of gallbladder with chronic cholecystitis without obstruction: Secondary | ICD-10-CM | POA: Insufficient documentation

## 2021-02-14 DIAGNOSIS — L97521 Non-pressure chronic ulcer of other part of left foot limited to breakdown of skin: Secondary | ICD-10-CM | POA: Diagnosis not present

## 2021-02-14 DIAGNOSIS — L03119 Cellulitis of unspecified part of limb: Secondary | ICD-10-CM | POA: Insufficient documentation

## 2021-02-14 DIAGNOSIS — R609 Edema, unspecified: Secondary | ICD-10-CM | POA: Insufficient documentation

## 2021-02-14 DIAGNOSIS — Z789 Other specified health status: Secondary | ICD-10-CM | POA: Insufficient documentation

## 2021-02-14 DIAGNOSIS — Z9114 Patient's other noncompliance with medication regimen: Secondary | ICD-10-CM | POA: Insufficient documentation

## 2021-02-14 DIAGNOSIS — M25462 Effusion, left knee: Secondary | ICD-10-CM | POA: Insufficient documentation

## 2021-02-14 MED ORDER — SILVER SULFADIAZINE 1 % EX CREA: TOPICAL_CREAM | CUTANEOUS | 0 refills | Status: DC

## 2021-02-21 NOTE — Progress Notes (Signed)
Subjective:  Patient ID: Barry [name], male    DOB: [birth year],  MRN: [id number]  Chief Complaint  Patient presents with   Foot Pain    The left foot has a spot on it and has been there for about a month and does not drain and is peeling and the right foot is good    Nail Problem    Trim nails    46 y.o. male presents for wound care. Hx confirmed with patient.  Objective:  Physical Exam:  General: Patient is awake, alert, oriented x 3 and in no acute distress.  Dermatology: Skin is warm and dry bilateral with a full thickness ulceration present  Right sub met 1. Ulceration 0.5x0.5 with surrounding HPK but no warmth erythema, SOI.    Vascular: Dorsalis Pedis pulse = 1/4 Bilateral,  Posterior Tibial pulse = 1/4 Bilateral,  Capillary Fill Time < 5 seconds  Neurologic: Protective sensation severely diminished bilateral  Musculosketal: There is no pain with palpation to ulcerated area however patient has diffuse pain to the plantar forefoot. No pain with compression to calves bilateral.  Hallux deviation with elevation of the first toe right foot. Assessment:   1. Right foot ulcer, with fat layer exposed ([place])   2. Ulcer of left foot, limited to breakdown of skin [facility])     Plan:  Patient was evaluated and treated and all questions answered.  Ulcer bilateral feet -Minimally debrided both lesions -Both without SOI, no abx indicated at this time. -Dress with SSD and foam border dressing. Change daily. Rx sent to pharmacy.  Return in about 4 weeks (around 03/14/2021) for Wound Care.

## 2021-03-01 DIAGNOSIS — G8929 Other chronic pain: Secondary | ICD-10-CM | POA: Diagnosis not present

## 2021-03-01 DIAGNOSIS — Z20822 Contact with and (suspected) exposure to covid-19: Secondary | ICD-10-CM | POA: Diagnosis not present

## 2021-03-01 DIAGNOSIS — M545 Low back pain, unspecified: Secondary | ICD-10-CM | POA: Diagnosis not present

## 2021-03-01 DIAGNOSIS — J020 Streptococcal pharyngitis: Secondary | ICD-10-CM | POA: Diagnosis not present

## 2021-03-01 DIAGNOSIS — J029 Acute pharyngitis, unspecified: Secondary | ICD-10-CM | POA: Diagnosis not present

## 2021-03-18 ENCOUNTER — Ambulatory Visit: Payer: BC Managed Care – PPO | Admitting: Podiatry

## 2021-03-27 DIAGNOSIS — Z23 Encounter for immunization: Secondary | ICD-10-CM | POA: Diagnosis not present

## 2021-03-27 DIAGNOSIS — Z0000 Encounter for general adult medical examination without abnormal findings: Secondary | ICD-10-CM | POA: Diagnosis not present

## 2021-04-04 DIAGNOSIS — L98491 Non-pressure chronic ulcer of skin of other sites limited to breakdown of skin: Secondary | ICD-10-CM | POA: Diagnosis not present

## 2021-04-04 DIAGNOSIS — S91105A Unspecified open wound of left lesser toe(s) without damage to nail, initial encounter: Secondary | ICD-10-CM | POA: Diagnosis not present

## 2021-04-04 DIAGNOSIS — L03116 Cellulitis of left lower limb: Secondary | ICD-10-CM | POA: Diagnosis not present

## 2021-04-04 DIAGNOSIS — L97521 Non-pressure chronic ulcer of other part of left foot limited to breakdown of skin: Secondary | ICD-10-CM | POA: Diagnosis not present

## 2021-04-04 DIAGNOSIS — M79672 Pain in left foot: Secondary | ICD-10-CM | POA: Diagnosis not present

## 2021-04-17 ENCOUNTER — Other Ambulatory Visit: Payer: Self-pay | Admitting: *Deleted

## 2021-04-17 ENCOUNTER — Ambulatory Visit (INDEPENDENT_AMBULATORY_CARE_PROVIDER_SITE_OTHER): Payer: BC Managed Care – PPO | Admitting: Sports Medicine

## 2021-04-17 ENCOUNTER — Encounter: Payer: Self-pay | Admitting: Sports Medicine

## 2021-04-17 VITALS — Temp 98.0°F

## 2021-04-17 DIAGNOSIS — L97521 Non-pressure chronic ulcer of other part of left foot limited to breakdown of skin: Secondary | ICD-10-CM | POA: Diagnosis not present

## 2021-04-17 DIAGNOSIS — S90424A Blister (nonthermal), right lesser toe(s), initial encounter: Secondary | ICD-10-CM

## 2021-04-17 DIAGNOSIS — E1142 Type 2 diabetes mellitus with diabetic polyneuropathy: Secondary | ICD-10-CM

## 2021-04-17 DIAGNOSIS — M79671 Pain in right foot: Secondary | ICD-10-CM

## 2021-04-17 DIAGNOSIS — M79672 Pain in left foot: Secondary | ICD-10-CM

## 2021-04-17 NOTE — Progress Notes (Signed)
Subjective: ?Barry [name] is a 46 y.o. male patient seen in office for evaluation of ulceration of the left foot and blister at right 2nd toe. States that 2 weeks it got bad he went to [facility] and was put on antibiotics and have a few more tabs left.  ? ?Patient is diabetic. FBS not recorded. ? ?Temp 98 ? ?Patient Active Problem List  ? Diagnosis Date Noted  ? Acute kidney injury superimposed on chronic kidney disease ([place]) 02/14/2021  ? Atypical chest pain 02/14/2021  ? Cellulitis and abscess of foot 02/14/2021  ? Cholelithiasis 02/14/2021  ? Chronic cholecystitis with calculus 02/14/2021  ? Dependent edema 02/14/2021  ? DKA (diabetic ketoacidosis) ([place]) 02/14/2021  ? Failure of outpatient treatment 02/14/2021  ? Fracture of right great toe 02/14/2021  ? Hyponatremia 02/14/2021  ? Noncompliance with medication regimen 02/14/2021  ? Knee effusion, left 02/14/2021  ? Pneumonia 02/14/2021  ? Retrograde ejaculation 12/12/2019  ? AKI (acute kidney injury) ([place]) 08/05/2018  ? Stage 3 chronic kidney disease ([place]) 08/05/2018  ? Hyperosmolar non-ketotic state in patient with type 2 diabetes mellitus ([place]) 08/04/2018  ? Pneumonia due to COVID-19 virus 08/04/2018  ? Pain in joint of left elbow 07/02/2018  ? Pain in left knee 07/02/2018  ? Pain of left hand 03/20/2018  ? Thoracic radiculopathy 05/01/2015  ? Chronic allergic conjunctivitis 04/20/2015  ? Congenital cataract 04/20/2015  ? Diabetic cataract ([place]) 04/20/2015  ? Dry eyes, bilateral 04/20/2015  ? Myopia of both eyes 04/20/2015  ? Paronychia 04/20/2015  ? Carpal tunnel syndrome, left 03/29/2015  ? Dysuria 03/21/2015  ? Arm numbness left 03/20/2015  ? Diabetic neuropathy ([place]) 03/20/2015  ? Neck pain 03/20/2015  ? Hypogonadism male 03/19/2015  ? Morbid obesity with BMI of 40.0-44.9, adult ([place]) 03/19/2015  ? Burning sensation of feet 03/16/2015  ? Common migraine 03/16/2015  ? Degenerative disc disease, lumbar 03/16/2015  ? Depression 03/16/2015  ?  Hypertension 03/16/2015  ? Low back pain 03/16/2015  ? OSA (obstructive sleep apnea) 03/16/2015  ? 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 HYPOTHYROIDISM 05/31/2008  ? DYSLIPIDEMIA 05/31/2008  ? OBESITY, MORBID 05/31/2008  ? MYOCARDIAL PERFUSION SCAN, WITH STRESS TEST, ABNORMAL 05/31/2008  ? Dyslipidemia 05/31/2008  ? ?Current Outpatient Medications on File Prior to Visit  ?Medication Sig Dispense Refill  ? Accu-Chek Softclix Lancets lancets 3 (three) times daily.    ? allopurinol (ZYLOPRIM) 300 MG tablet 300 mg.    ? aspirin 81 MG chewable tablet 81 mg.    ? atorvastatin (LIPITOR) 80 MG tablet Take 80 mg by mouth daily.     ? B Complex Vitamins (B COMPLEX PO) Take 1 tablet by mouth daily.    ? baclofen (LIORESAL) 10 MG tablet 10 mg.    ? Blood Glucose Monitoring Suppl (GLUCOCOM BLOOD GLUCOSE MONITOR) DEVI 3 (three) times daily.    ? cholecalciferol (VITAMIN D3) 25 MCG (1000 UNIT) tablet Take 2,000 Units by mouth daily.    ? cyanocobalamin 1000 MCG tablet Take by mouth.    ? cyclobenzaprine (FLEXERIL) 10 MG tablet Take by mouth.    ? DULoxetine (CYMBALTA) 30 MG capsule Take 1 capsule by mouth daily.    ? fluticasone (FLONASE) 50 MCG/ACT nasal spray Place into the nose.    ? furosemide (LASIX) 20 MG tablet 20 mg.    ? HUMALOG MIX 75/25 (75-25) 100 UNIT/ML SUSP injection Inject  100 Units into the skin 2 (two) times a day.    ? HYDROcodone-acetaminophen (NORCO/VICODIN) 5-325 MG tablet Take 1 tablet by mouth every 6 (six) hours as needed for severe pain. 12 tablet 0  ? JARDIANCE 25 MG TABS tablet Take 25 mg by mouth daily.    ? labetalol (NORMODYNE) 200 MG tablet Take 200 mg by mouth 2 (two) times daily.    ? levothyroxine (SYNTHROID) 75 MCG tablet Take 1 tablet by mouth daily.    ? lisinopril-hydrochlorothiazide (ZESTORETIC) 10-12.5 MG tablet Take 1 tablet by mouth 2 (two) times daily.    ?  metFORMIN (GLUCOPHAGE) 1000 MG tablet Take 1,000 mg by mouth 2 (two) times daily.    ? metFORMIN (GLUCOPHAGE) 500 MG tablet Take 1,000 mg by mouth 2 (two) times daily with a meal.    ? methocarbamol (ROBAXIN) 500 MG tablet Take 500 mg by mouth 3 (three) times daily.    ? miconazole (MICOTIN) 2 % cream Apply 1 application topically 2 (two) times daily as needed (yeast infection due to antibiotic).    ? Multiple Vitamin (MULTIVITAMIN WITH MINERALS) TABS tablet Take 1 tablet by mouth daily.    ? Multiple Vitamins-Minerals (PRESERVISION AREDS PO) Take 1 tablet by mouth daily.    ? pregabalin (LYRICA) 75 MG capsule Take 1 capsule (75 mg total) by mouth 2 (two) times daily. 60 capsule 2  ? RELION INSULIN SYRINGE 1ML/31G 31G X 5/16" 1 ML MISC Inject 1 Syringe into the skin 2 (two) times daily.    ? silver sulfADIAZINE (SILVADENE) 1 % cream Apply pea-sized amount to wound daily. 50 g 0  ? ?No current facility-administered medications on file prior to visit.  ? ?No Known Allergies ? ?No results found for this or any previous visit (from the past 2160 hour(s)). ? ?Objective: ?There were no vitals filed for this visit. ? ?General: Patient is awake, alert, oriented x 3 and in no acute distress. ? ?Dermatology: Skin is warm and dry bilateral with a partal thickness ulceration present left lateral 5th MTPJ, Ulceration measures 2 cm x 1cm x 0.2 cm. There is a keratotic border with a granular base. The ulceration does not  ?probe to bone. There is no malodor, bloody active drainage, no erythema, no edema.  ? ?There is dry blood blister at the plantar aspect of the distal tuft of the right 2nd toe. No underlying opening. No other acute signs of infection. ?  ?Vascular: Dorsalis Pedis pulse = 1/4 Bilateral,  Posterior Tibial pulse = 1/4 Bilateral,  Capillary Fill Time < 5 seconds ? ?Neurologic: Protective sensation absent bilateral.  ? ?Musculosketal: + Bunion and hammertoe deformity, Mild pain with palpation to ulcerated area. No  pain with compression to calves bilateral. No gross bony deformities noted bilateral. ? ? ?No results for input(s): GRAMSTAIN, LABORGA in the last 8760 hours. ? ?Assessment and Plan:  ?Problem List Items Addressed This Visit   ? ?  ? Endocrine  ? Diabetic neuropathy ([place])  ? ?Other Visit Diagnoses   ? ? Ulcer of left foot, limited to breakdown of skin ([place])    -  Primary  ? Relevant Orders  ? WOUND CULTURE  ? Toe blister without infection, right, initial encounter      ? Right foot pain      ? Left foot pain      ? ?  ? ? ? ?-Examined patient and discussed the progression of the wound and treatment alternatives. ?- Excisionally dedbrided ulceration at  left 5th MTPJ to healthy bleeding borders removing nonviable tissue using a sterile chisel blade. Wound measures post debridement as above. Wound was debrided to the level of the dermis with viable wound base exposed to promote healing. Hemostasis was achieved with manuel pressure. Patient tolerated procedure well without any discomfort or anesthesia necessary for this wound debridement.  ?-Wound culture was obtained from left 5th MTPJ; we will call patient if he needs more antibiotics ?-Applied medihoney and dry sterile dressing and instructed patient to continue with daily dressings at home consisting of same once daily- Advised patient to go to the ER or return to office if the wound worsens or if constitutional symptoms are present. ?-Advised patient to continue with work shoe that does not rub his toe(s) ?-Patient to return to office in 3 weeks for follow up care and evaluation or sooner if problems arise. ? ?Landis Martins, DPM ?

## 2021-04-18 DIAGNOSIS — N1832 Chronic kidney disease, stage 3b: Secondary | ICD-10-CM | POA: Diagnosis not present

## 2021-04-18 DIAGNOSIS — E1142 Type 2 diabetes mellitus with diabetic polyneuropathy: Secondary | ICD-10-CM | POA: Diagnosis not present

## 2021-04-18 DIAGNOSIS — E113292 Type 2 diabetes mellitus with mild nonproliferative diabetic retinopathy without macular edema, left eye: Secondary | ICD-10-CM | POA: Diagnosis not present

## 2021-04-18 DIAGNOSIS — Z794 Long term (current) use of insulin: Secondary | ICD-10-CM | POA: Diagnosis not present

## 2021-04-18 DIAGNOSIS — Z7984 Long term (current) use of oral hypoglycemic drugs: Secondary | ICD-10-CM | POA: Diagnosis not present

## 2021-04-18 DIAGNOSIS — E039 Hypothyroidism, unspecified: Secondary | ICD-10-CM | POA: Diagnosis not present

## 2021-04-18 DIAGNOSIS — E1122 Type 2 diabetes mellitus with diabetic chronic kidney disease: Secondary | ICD-10-CM | POA: Diagnosis not present

## 2021-04-22 ENCOUNTER — Telehealth: Payer: Self-pay | Admitting: *Deleted

## 2021-04-22 ENCOUNTER — Other Ambulatory Visit: Payer: Self-pay | Admitting: Sports Medicine

## 2021-04-22 LAB — WOUND CULTURE

## 2021-04-22 MED ORDER — GENTAMICIN SULFATE 0.1 % EX OINT: 1.0000 "application " | TOPICAL_OINTMENT | Freq: Every day | CUTANEOUS | 0 refills | Status: DC

## 2021-04-22 NOTE — Telephone Encounter (Signed)
Called and left a message for the patient and relayed the message per Dr Stover. Barry [name] ?

## 2021-04-22 NOTE — Progress Notes (Signed)
Sent Gentamicin ointment to use for wound care instead of medihoney based on + culture of staph ?

## 2021-04-22 NOTE — Telephone Encounter (Signed)
-----   Message from Landis Martins, [place] sent at 04/22/2021  4:09 PM EDT ----- ?Please let patient know that I sent Gentamicin ointment to use for wound care instead of medihoney based on + culture of staph. He does not need any additional antibiotics by mouth at this time however if foot gets worse, warm, red, swollen, signs of cellulitis then he needs to go to hospital for IV antibiotics. ?Thanks ?Dr. Cannon Kettle ?

## 2021-05-14 ENCOUNTER — Ambulatory Visit: Payer: BC Managed Care – PPO | Admitting: Sports Medicine

## 2021-05-22 ENCOUNTER — Other Ambulatory Visit: Payer: Self-pay

## 2021-05-22 ENCOUNTER — Emergency Department (HOSPITAL_COMMUNITY): Payer: BC Managed Care – PPO

## 2021-05-22 ENCOUNTER — Encounter (HOSPITAL_COMMUNITY): Payer: Self-pay

## 2021-05-22 ENCOUNTER — Emergency Department (HOSPITAL_COMMUNITY)
Admission: EM | Admit: 2021-05-22 | Discharge: 2021-05-22 | Disposition: A | Discharge status: Home / Self Care | Payer: BC Managed Care – PPO | Attending: Emergency Medicine | Admitting: Emergency Medicine

## 2021-05-22 DIAGNOSIS — Z794 Long term (current) use of insulin: Secondary | ICD-10-CM | POA: Diagnosis not present

## 2021-05-22 DIAGNOSIS — E11621 Type 2 diabetes mellitus with foot ulcer: Secondary | ICD-10-CM | POA: Diagnosis not present

## 2021-05-22 DIAGNOSIS — L089 Local infection of the skin and subcutaneous tissue, unspecified: Secondary | ICD-10-CM

## 2021-05-22 DIAGNOSIS — Z7984 Long term (current) use of oral hypoglycemic drugs: Secondary | ICD-10-CM | POA: Insufficient documentation

## 2021-05-22 DIAGNOSIS — L97529 Non-pressure chronic ulcer of other part of left foot with unspecified severity: Secondary | ICD-10-CM | POA: Diagnosis not present

## 2021-05-22 DIAGNOSIS — S91302A Unspecified open wound, left foot, initial encounter: Secondary | ICD-10-CM | POA: Diagnosis not present

## 2021-05-22 DIAGNOSIS — Z7982 Long term (current) use of aspirin: Secondary | ICD-10-CM | POA: Diagnosis not present

## 2021-05-22 LAB — CBC WITH DIFFERENTIAL/PLATELET
Abs Immature Granulocytes: 0.07 10*3/uL (ref 0.00–0.07)
Basophils Absolute: 0.1 10*3/uL (ref 0.0–0.1)
Basophils Relative: 1 %
Eosinophils Absolute: 0.3 10*3/uL (ref 0.0–0.5)
Eosinophils Relative: 4 %
HCT: 39.6 % (ref 39.0–52.0)
Hemoglobin: 13.6 g/dL (ref 13.0–17.0)
Immature Granulocytes: 1 %
Lymphocytes Relative: 30 %
Lymphs Abs: 2.4 10*3/uL (ref 0.7–4.0)
MCH: 30.8 pg (ref 26.0–34.0)
MCHC: 34.3 g/dL (ref 30.0–36.0)
MCV: 89.8 fL (ref 80.0–100.0)
Monocytes Absolute: 0.9 10*3/uL (ref 0.1–1.0)
Monocytes Relative: 11 %
Neutro Abs: 4.4 10*3/uL (ref 1.7–7.7)
Neutrophils Relative %: 53 %
Platelets: 319 10*3/uL (ref 150–400)
RBC: 4.41 MIL/uL (ref 4.22–5.81)
RDW: 13.1 % (ref 11.5–15.5)
WBC: 8.1 10*3/uL (ref 4.0–10.5)
nRBC: 0 % (ref 0.0–0.2)

## 2021-05-22 LAB — COMPREHENSIVE METABOLIC PANEL
ALT: 16 U/L (ref 0–44)
AST: 19 U/L (ref 15–41)
Albumin: 3.6 g/dL (ref 3.5–5.0)
Alkaline Phosphatase: 66 U/L (ref 38–126)
Anion gap: 8 (ref 5–15)
BUN: 29 mg/dL — ABNORMAL HIGH (ref 6–20)
CO2: 24 mmol/L (ref 22–32)
Calcium: 8.7 mg/dL — ABNORMAL LOW (ref 8.9–10.3)
Chloride: 103 mmol/L (ref 98–111)
Creatinine, Ser: 2.01 mg/dL — ABNORMAL HIGH (ref 0.61–1.24)
GFR, Estimated: 41 mL/min — ABNORMAL LOW (ref >60)
Glucose, Bld: 263 mg/dL — ABNORMAL HIGH (ref 70–99)
Potassium: 3.6 mmol/L (ref 3.5–5.1)
Sodium: 135 mmol/L (ref 135–145)
Total Bilirubin: 0.7 mg/dL (ref 0.3–1.2)
Total Protein: 7.7 g/dL (ref 6.5–8.1)

## 2021-05-22 IMAGING — DX DG FOOT COMPLETE 3+V*L*
3 series · 3 of 3 positions shown · non-contrast
Comparison: None Available.

CLINICAL DATA: Nonhealing wound lateral side of the left foot.

EXAM:
LEFT FOOT - COMPLETE 3+ VIEW

[foot ap]
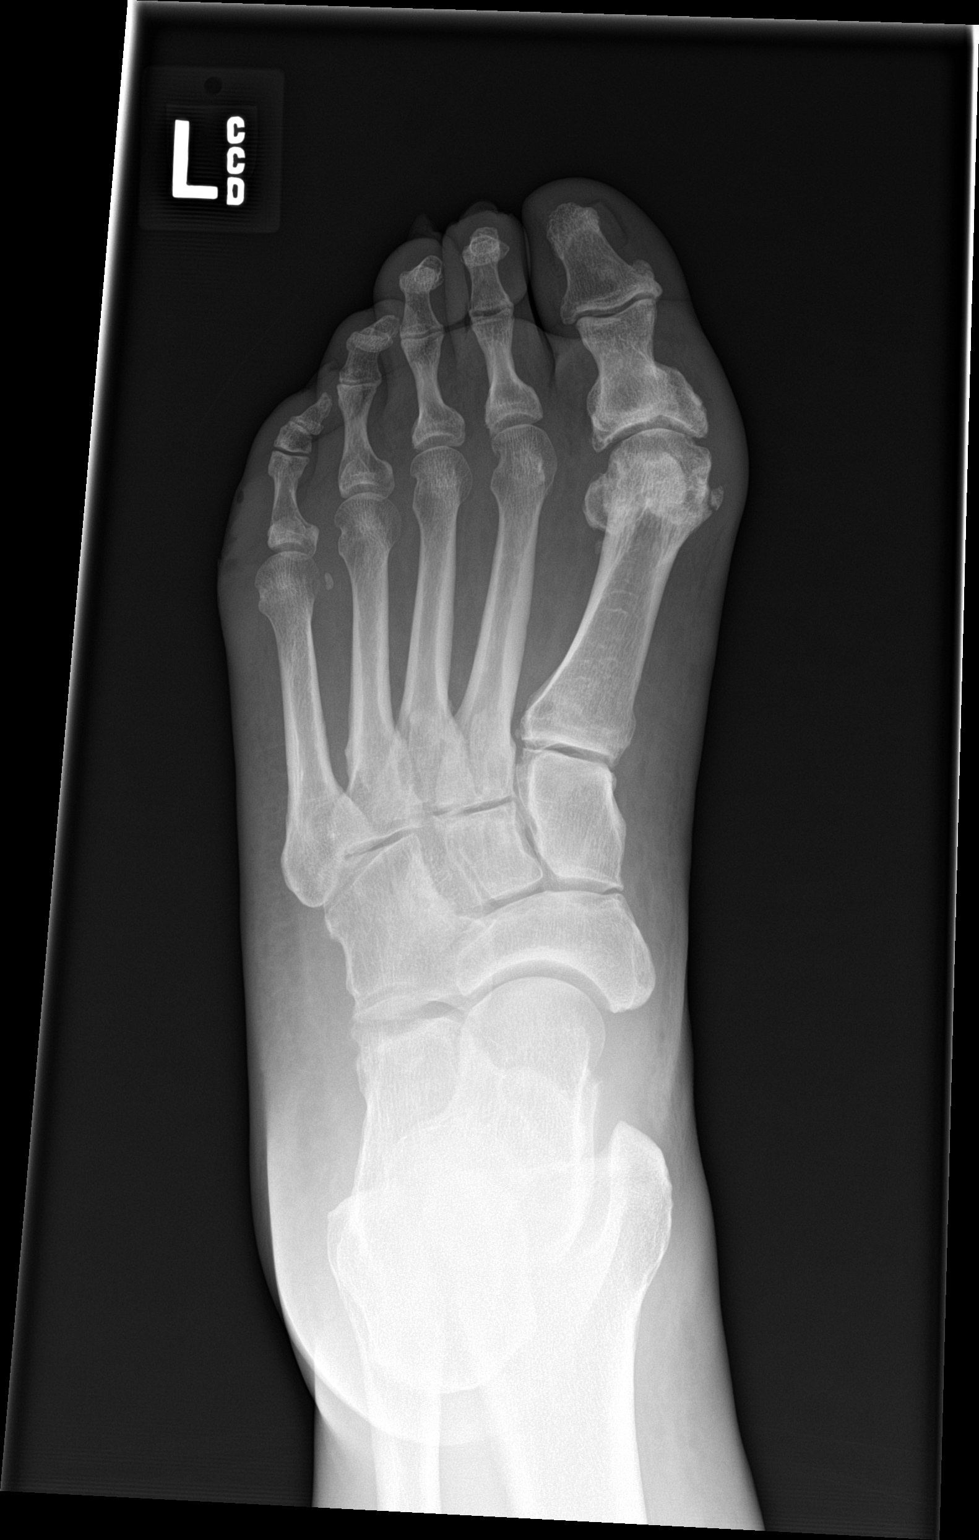

[foot obl]
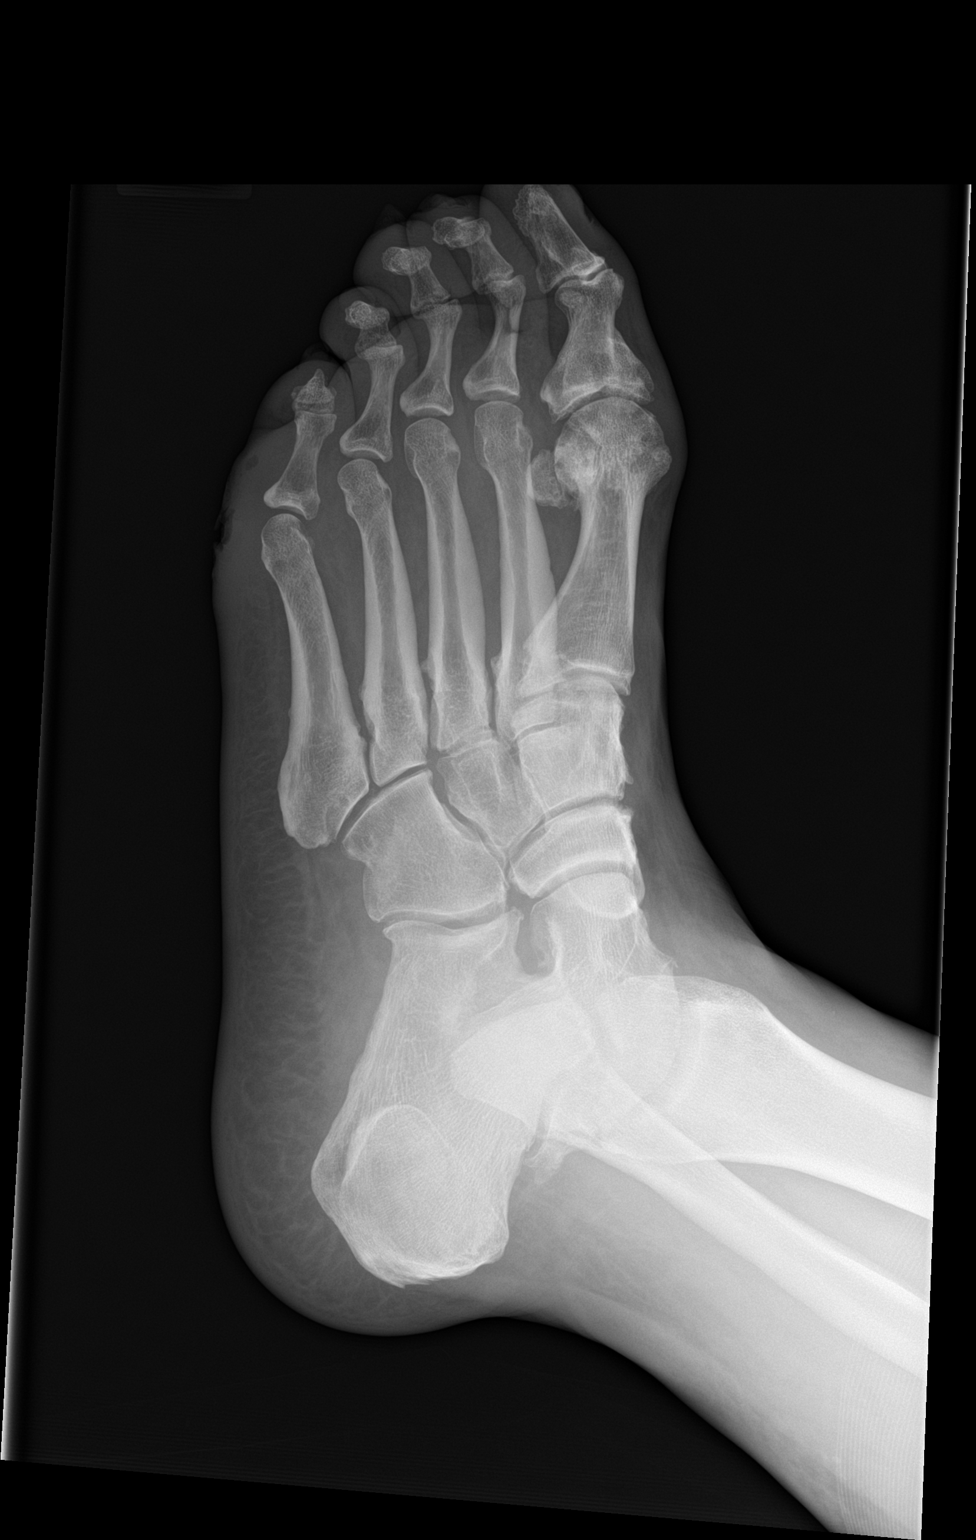

[foot lat]
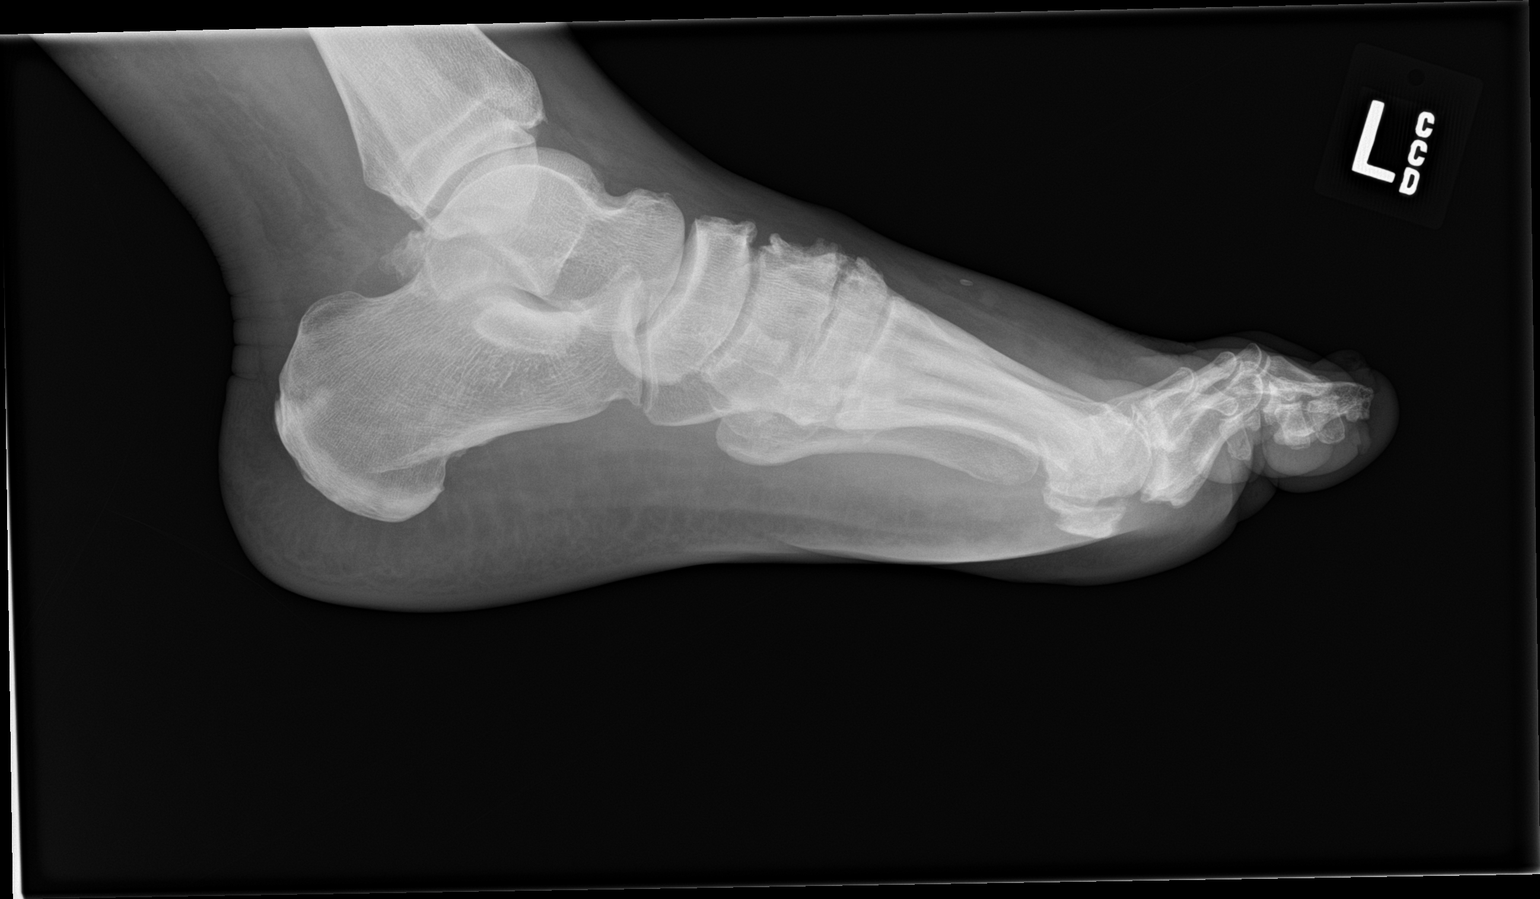

[3 of 3 positions shown; findings below may reference images not displayed]

FINDINGS: Three view study. No fracture. No subluxation or dislocation.
Degenerative changes are seen in the MTP joint of the great toe in
deformity of the great toe proximal phalanx suggests remote trauma.
Soft tissue gas lateral to the little toe MTP joint is compatible
with the reported history of a soft tissue wound. No underlying bony
destruction to suggest osteomyelitis.
IMPRESSION: 1. Soft tissue gas lateral to the little toe MTP joint compatible
with the reported history of soft tissue wound. No underlying bony
destruction to suggest osteomyelitis.
2. No fracture or dislocation.

## 2021-05-22 MED ORDER — OXYCODONE-ACETAMINOPHEN 5-325 MG PO TABS
2.0000 | ORAL_TABLET | Freq: Once | ORAL | Status: AC
  Administered 2021-05-22: 2 via ORAL
  Filled 2021-05-22: qty 2

## 2021-05-22 MED ORDER — OXYCODONE-ACETAMINOPHEN 5-325 MG PO TABS: 2.0000 | ORAL_TABLET | ORAL | 0 refills | Status: DC | PRN

## 2021-05-22 MED ORDER — DOXYCYCLINE HYCLATE 100 MG PO CAPS: 100.0000 mg | ORAL_CAPSULE | Freq: Two times a day (BID) | ORAL | 0 refills | Status: DC

## 2021-05-22 MED ORDER — DOXYCYCLINE HYCLATE 100 MG PO TABS
100.0000 mg | ORAL_TABLET | Freq: Once | ORAL | Status: AC
Start: 2021-05-22 — End: 2021-05-22
  Administered 2021-05-22: 100 mg via ORAL
  Filled 2021-05-22: qty 1

## 2021-05-22 NOTE — Discharge Instructions (Addendum)
Your labs were reassuring aside from a mild dehydration. ?It appears that you have a superficial skin infection without evidence of bone infection on your x-ray and labs.  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  Please follow-up with wound care and/or your podiatrist for further management of the wound.  ?

## 2021-05-22 NOTE — ED Notes (Signed)
Save blue tube in main lab °

## 2021-05-22 NOTE — ED Triage Notes (Signed)
Pt reports with a left foot wound that is causing foot and leg swelling.  ?

## 2021-05-23 NOTE — ED Provider Notes (Signed)
?[facility] DEPT ?Provider Note ? ? ?CSN: [id number] ?Arrival date & time: 05/22/21  [id number] ? ?  ? ?History ? ?Chief Complaint  ?Patient presents with  ? foot wound  ? ? ?Barry [name] is a 46 y.o. male. ? ?Patient with a history of diabetes and has had ulcers in the past that do not heal.  He has a ulcer to his left lateral foot that was improving but is now having malodorous discharge again with surrounding erythema and pain.  Also has mild erythema and swelling proximal to the wound. ? ? ?Home Medications ?Prior to Admission medications   ?Medication Sig Start Date End Date Taking? Authorizing Provider  ?doxycycline (VIBRAMYCIN) 100 MG capsule Take 1 capsule (100 mg total) by mouth 2 (two) times daily. One po bid x 7 days 05/22/21  Yes Londyn Wotton, Corene Cornea, MD  ?oxyCODONE-acetaminophen (PERCOCET) 5-325 MG tablet Take 2 tablets by mouth every 4 (four) hours as needed. 05/22/21  Yes Kenise Barraco, Corene Cornea, MD  ?Accu-Chek Softclix Lancets lancets 3 (three) times daily. 02/01/21   [provider]  ?allopurinol (ZYLOPRIM) 300 MG tablet 300 mg. 10/22/19   [provider]  ?aspirin 81 MG chewable tablet 81 mg. 05/03/17   [provider]  ?atorvastatin (LIPITOR) 80 MG tablet Take 80 mg by mouth daily.  11/21/17   [provider]  ?B Complex Vitamins (B COMPLEX PO) Take 1 tablet by mouth daily.    [provider]  ?baclofen (LIORESAL) 10 MG tablet 10 mg. 09/20/20   [provider]  ?Blood Glucose Monitoring Suppl (GLUCOCOM BLOOD GLUCOSE MONITOR) DEVI 3 (three) times daily. 01/30/21   [provider]  ?cholecalciferol (VITAMIN D3) 25 MCG (1000 UNIT) tablet Take 2,000 Units by mouth daily.    [provider]  ?cyanocobalamin 1000 MCG tablet Take by mouth.    [provider]  ?cyclobenzaprine (FLEXERIL) 10 MG tablet Take by mouth. 08/18/16   [provider]  ?DULoxetine (CYMBALTA) 30 MG capsule Take 1 capsule by mouth daily. 02/01/21    [provider]  ?fluticasone (FLONASE) 50 MCG/ACT nasal spray Place into the nose.    [provider]  ?furosemide (LASIX) 20 MG tablet 20 mg. 01/17/20   [provider]  ?gentamicin ointment (GARAMYCIN) 0.1 % Apply 1 application. topically daily. For wound care 04/22/21   Landis Martins, DPM  ?HUMALOG MIX 75/25 (75-25) 100 UNIT/ML SUSP injection Inject 100 Units into the skin 2 (two) times a day. 06/07/18   [provider]  ?HYDROcodone-acetaminophen (NORCO/VICODIN) 5-325 MG tablet Take 1 tablet by mouth every 6 (six) hours as needed for severe pain. 06/21/19   Evelina Bucy, DPM  ?JARDIANCE 25 MG TABS tablet Take 25 mg by mouth daily. 10/18/18   [provider]  ?labetalol (NORMODYNE) 200 MG tablet Take 200 mg by mouth 2 (two) times daily. 01/21/21   [provider]  ?levothyroxine (SYNTHROID) 75 MCG tablet Take 1 tablet by mouth daily. 11/20/17   [provider]  ?lisinopril-hydrochlorothiazide (ZESTORETIC) 10-12.5 MG tablet Take 1 tablet by mouth 2 (two) times daily. 04/13/19   [provider]  ?metFORMIN (GLUCOPHAGE) 1000 MG tablet Take 1,000 mg by mouth 2 (two) times daily. 01/30/21   [provider]  ?metFORMIN (GLUCOPHAGE) 500 MG tablet Take 1,000 mg by mouth 2 (two) times daily with a meal. 05/14/18   [provider]  ?methocarbamol (ROBAXIN) 500 MG tablet Take 500 mg by mouth 3 (three) times daily. 01/26/21   [provider]  ?miconazole (MICOTIN) 2 % cream Apply 1 application topically 2 (two) times daily as needed (yeast infection due to antibiotic).    [provider]  ?Multiple Vitamin (MULTIVITAMIN WITH MINERALS) TABS tablet Take 1 tablet by mouth daily.    [provider]  ?Multiple Vitamins-Minerals (PRESERVISION AREDS PO) Take 1 tablet by mouth daily.    [provider]  ?pregabalin (LYRICA) 75 MG capsule Take 1 capsule (75 mg total) by mouth 2 (two) times daily. 08/23/19   Landis Martins, DPM  ?RELION INSULIN SYRINGE 1ML/31G 31G X 5/16" 1 ML MISC Inject 1 Syringe into the skin 2 (two) times daily. 01/26/21   [provider]  ?silver sulfADIAZINE (SILVADENE) 1 % cream Apply pea-sized amount to wound daily. 02/14/21   Evelina Bucy, DPM  ?   ? ?Allergies    ?Patient has no known allergies.   ? ?Review of Systems   ?Review of Systems ? ?Physical Exam ?Updated Vital Signs ?BP (!) 144/82   Pulse 87   Temp 98.6 ?F (37 ?C) (Oral)   Resp 18   Ht 6\' 3"  (1.905 m)   Wt (!) 152.9 kg   SpO2 92%   BMI 42.12 kg/m?  ?Physical Exam ?Vitals and nursing note reviewed.  ?Constitutional:   ?   Appearance: He is well-developed.  ?HENT:  ?   Head: Normocephalic and atraumatic.  ?   Mouth/Throat:  ?   Mouth: Mucous membranes are moist.  ?   Pharynx: Oropharynx is clear.  ?Eyes:  ?   Pupils: Pupils are equal, round, and reactive to light.  ?Cardiovascular:  ?   Rate and Rhythm: Normal rate.  ?Pulmonary:  ?   Effort: Pulmonary effort is normal. No respiratory distress.  ?Abdominal:  ?   General: Abdomen is flat. There is no distension.  ?Musculoskeletal:     ?   General: Normal range of motion.  ?   Cervical back: Normal range of motion.  ?Skin: ?   General: Skin is warm and dry.  ?   Findings: Erythema and lesion present.  ?   Comments:   He has a ulcer to his left lateral foot that was improving but is now having malodorous discharge again with surrounding erythema and pain.  Also has mild erythema and swelling proximal to the wound.  ?Neurological:  ?   General: No focal deficit present.  ?   Mental Status: He is alert.  ? ? ?ED Results / Procedures / Treatments   ?Labs ?(all labs ordered are listed, but only abnormal results are displayed) ?Labs Reviewed  ?COMPREHENSIVE METABOLIC PANEL - Abnormal; Notable for the following components:  ?    Result Value  ? Glucose, Bld 263 (*)   ? BUN 29 (*)   ? Creatinine, Ser 2.01 (*)   ? Calcium 8.7 (*)   ? GFR, Estimated 41 (*)   ? All other components within  normal limits  ?CBC WITH DIFFERENTIAL/PLATELET  ? ? ?EKG ?None ? ?Radiology ?DG Foot Complete Left ? ?Result Date: 05/22/2021 ?CLINICAL DATA:  Nonhealing wound lateral side of the left foot. EXAM: LEFT FOOT - COMPLETE 3+ VIEW COMPARISON:  None Available. FINDINGS: Three view study. No fracture. No subluxation or dislocation. Degenerative changes are seen in the MTP joint of the great toe in deformity of the great toe proximal phalanx suggests remote trauma. Soft tissue gas lateral to the little toe MTP joint is compatible with the reported history of a soft  tissue wound. No underlying bony destruction to suggest osteomyelitis. IMPRESSION: 1. Soft tissue gas lateral to the little toe MTP joint compatible with the reported history of soft tissue wound. No underlying bony destruction to suggest osteomyelitis. 2. No fracture or dislocation. Electronically Signed   By: Misty Stanley M.D.   On: 05/22/2021 07:05   ? ?Procedures ?Procedures  ? ? ?Medications Ordered in ED ?Medications  ?oxyCODONE-acetaminophen (PERCOCET/ROXICET) 5-325 MG per tablet 2 tablet (2 tablets Oral Given 05/22/21 [id number])  ?doxycycline (VIBRA-TABS) tablet 100 mg (100 mg Oral Given 05/22/21 0634)  ? ? ?ED Course/ Medical Decision Making/ A&P ?  ?                        ?Medical Decision Making ?Amount and/or Complexity of Data Reviewed ?Labs: ordered. ?Radiology: ordered. ? ?Risk ?Prescription drug management. ? ? ?Superficial wound with malodorous discharge and erythema likely infection.  No evidence of osteo on exam or on x-ray.  No evidence of sepsis or other significant abnormality require hospital admission, IV antibiotics.  Reviewed his previous wound cultures done on the same wound a few weeks ago and will start doxycycline as it was susceptible to that.  We will also refer to wound care and back to podiatry as needed for further management. ? ?Final Clinical Impression(s) / ED Diagnoses ?Final diagnoses:  ?Foot infection  ? ? ?Rx / DC Orders ?ED  Discharge Orders   ? ?      Ordered  ?  AMB referral to wound care center       ? 05/22/21 0713  ?  doxycycline (VIBRAMYCIN) 100 MG capsule  2 times daily       ? 05/22/21 0716  ?  oxyCODONE-acetaminophen (PERCOCET) 5

## 2021-05-24 DIAGNOSIS — L97529 Non-pressure chronic ulcer of other part of left foot with unspecified severity: Secondary | ICD-10-CM | POA: Diagnosis not present

## 2021-05-24 DIAGNOSIS — R079 Chest pain, unspecified: Secondary | ICD-10-CM | POA: Diagnosis not present

## 2021-05-24 DIAGNOSIS — M79672 Pain in left foot: Secondary | ICD-10-CM | POA: Diagnosis not present

## 2021-05-24 DIAGNOSIS — M7989 Other specified soft tissue disorders: Secondary | ICD-10-CM | POA: Diagnosis not present

## 2021-05-24 DIAGNOSIS — M25531 Pain in right wrist: Secondary | ICD-10-CM | POA: Diagnosis not present

## 2021-05-24 DIAGNOSIS — E11621 Type 2 diabetes mellitus with foot ulcer: Secondary | ICD-10-CM | POA: Diagnosis not present

## 2021-05-24 DIAGNOSIS — R0789 Other chest pain: Secondary | ICD-10-CM | POA: Diagnosis not present

## 2021-05-24 DIAGNOSIS — L97428 Non-pressure chronic ulcer of left heel and midfoot with other specified severity: Secondary | ICD-10-CM | POA: Diagnosis not present

## 2021-05-25 DIAGNOSIS — M7989 Other specified soft tissue disorders: Secondary | ICD-10-CM | POA: Diagnosis not present

## 2021-05-27 ENCOUNTER — Emergency Department (HOSPITAL_COMMUNITY)
Admission: EM | Admit: 2021-05-27 | Discharge: 2021-05-27 | Disposition: A | Discharge status: Home / Self Care | Payer: BC Managed Care – PPO | Attending: Emergency Medicine | Admitting: Emergency Medicine

## 2021-05-27 ENCOUNTER — Encounter (HOSPITAL_COMMUNITY): Payer: Self-pay

## 2021-05-27 DIAGNOSIS — M109 Gout, unspecified: Secondary | ICD-10-CM | POA: Diagnosis not present

## 2021-05-27 DIAGNOSIS — Z7982 Long term (current) use of aspirin: Secondary | ICD-10-CM | POA: Diagnosis not present

## 2021-05-27 DIAGNOSIS — E119 Type 2 diabetes mellitus without complications: Secondary | ICD-10-CM | POA: Diagnosis not present

## 2021-05-27 DIAGNOSIS — M7989 Other specified soft tissue disorders: Secondary | ICD-10-CM | POA: Diagnosis not present

## 2021-05-27 DIAGNOSIS — E039 Hypothyroidism, unspecified: Secondary | ICD-10-CM | POA: Diagnosis not present

## 2021-05-27 DIAGNOSIS — Z794 Long term (current) use of insulin: Secondary | ICD-10-CM | POA: Diagnosis not present

## 2021-05-27 DIAGNOSIS — Z7984 Long term (current) use of oral hypoglycemic drugs: Secondary | ICD-10-CM | POA: Diagnosis not present

## 2021-05-27 DIAGNOSIS — Z79899 Other long term (current) drug therapy: Secondary | ICD-10-CM | POA: Insufficient documentation

## 2021-05-27 HISTORY — DX: Gout, unspecified: M10.9

## 2021-05-27 MED ORDER — MELOXICAM 7.5 MG PO TABS: ORAL_TABLET | ORAL | 0 refills | Status: DC

## 2021-05-27 MED ORDER — MELOXICAM 7.5 MG PO TABS
7.5000 mg | ORAL_TABLET | Freq: Once | ORAL | Status: AC
  Administered 2021-05-27: 7.5 mg via ORAL
  Filled 2021-05-27: qty 1

## 2021-05-27 MED ORDER — OXYCODONE-ACETAMINOPHEN 10-325 MG PO TABS: 1.0000 | ORAL_TABLET | Freq: Four times a day (QID) | ORAL | 0 refills | Status: DC | PRN

## 2021-05-27 NOTE — ED Provider Notes (Signed)
? ?[facility] DEPT ?Provider Note: Georgena Spurling, MD, FACEP ? ?CSN: [id number] ?MRN: [id number] ?ARRIVAL: 05/27/21 at 0335 ?ROOM: [facility] ? ? ?CHIEF COMPLAINT  ?Foot Swelling ? ? ?HISTORY OF PRESENT ILLNESS  ?05/27/21 4:45 AM ?Barry [name] is a 46 y.o. male with a history of gout on allopurinol.  He was seen on 05/22/2021 for an infected ulcer of his lateral left foot and was placed on doxycycline.  The associated redness and warmth have improved since starting the doxycycline.   ? ?He is here with pain and swelling in his right hand and left lower leg that began 3 days ago.  The right hand pain is most prominent at the base of the thumb and the left lower leg pain is most prominent in the ankle.  Thinking this was a gout flare he took 3 tablets of colchicine 3 days ago without adequate relief.  He did try a dose of prednisone that he had at home but this caused his blood sugar to be elevated.  He rates associated pain as an 8 out of 10, aching in nature.  Pain is worse with palpation or movement.  ? ? ?Past Medical History:  ?Diagnosis Date  ? Carpal tunnel syndrome   ? Diabetes mellitus without complication ([place])   ? Gout   ? Hypercholesteremia   ? Hypothyroidism   ? Neuropathy   ? Obesity   ? Thyroid disease   ? ? ?Past Surgical History:  ?Procedure Laterality Date  ? FRACTURE SURGERY    ? INCISE AND DRAIN ABCESS    ? IRRIGATION AND DEBRIDEMENT FOOT Right 04/27/2019  ? Procedure: Debridement and Irrigation with possible placement of antibiotic beads;  Surgeon: Evelina Bucy, DPM;  Location: [facility];  Service: Podiatry;  Laterality: Right;  ? ORIF TOE FRACTURE Right 04/27/2019  ? Procedure: Open Reduction vs Excision of Fracture Fragment Right great toe, with pinning.;  Surgeon: Evelina Bucy, DPM;  Location: [facility];  Service: Podiatry;  Laterality: Right;  ? TONSILLECTOMY    ? ? ?History reviewed. No pertinent family history. ? ?Social History  ? ?Tobacco Use  ? Smoking status: Never  ? Smokeless tobacco:  Never  ?Vaping Use  ? Vaping Use: Never used  ?Substance Use Topics  ? Alcohol use: Yes  ? Drug use: No  ? ? ?Prior to Admission medications   ?Medication Sig Start Date End Date Taking? Authorizing Provider  ?meloxicam (MOBIC) 7.5 MG tablet Take 1 tablet daily as needed for gout flare. 05/27/21  Yes Wallis Spizzirri, MD  ?oxyCODONE-acetaminophen (PERCOCET) 10-325 MG tablet Take 1 tablet by mouth every 6 (six) hours as needed for pain. 05/27/21  Yes Zakiyyah Savannah, MD  ?Accu-Chek Softclix Lancets lancets 3 (three) times daily. 02/01/21   [provider]  ?allopurinol (ZYLOPRIM) 300 MG tablet 300 mg. 10/22/19   [provider]  ?aspirin 81 MG chewable tablet 81 mg. 05/03/17   [provider]  ?atorvastatin (LIPITOR) 80 MG tablet Take 80 mg by mouth daily.  11/21/17   [provider]  ?B Complex Vitamins (B COMPLEX PO) Take 1 tablet by mouth daily.    [provider]  ?baclofen (LIORESAL) 10 MG tablet 10 mg. 09/20/20   [provider]  ?Blood Glucose Monitoring Suppl (GLUCOCOM BLOOD GLUCOSE MONITOR) DEVI 3 (three) times daily. 01/30/21   [provider]  ?cholecalciferol (VITAMIN D3) 25 MCG (1000 UNIT) tablet Take 2,000 Units by mouth daily.    [provider]  ?cyanocobalamin 1000  MCG tablet Take by mouth.    [provider]  ?cyclobenzaprine (FLEXERIL) 10 MG tablet Take by mouth. 08/18/16   [provider]  ?doxycycline (VIBRAMYCIN) 100 MG capsule Take 1 capsule (100 mg total) by mouth 2 (two) times daily. One po bid x 7 days 05/22/21   Mesner, Corene Cornea, MD  ?DULoxetine (CYMBALTA) 30 MG capsule Take 1 capsule by mouth daily. 02/01/21   [provider]  ?fluticasone (FLONASE) 50 MCG/ACT nasal spray Place into the nose.    [provider]  ?furosemide (LASIX) 20 MG tablet 20 mg. 01/17/20   [provider]  ?gentamicin ointment (GARAMYCIN) 0.1 % Apply 1 application. topically daily. For wound care 04/22/21   Landis Martins, DPM   ?HUMALOG MIX 75/25 (75-25) 100 UNIT/ML SUSP injection Inject 100 Units into the skin 2 (two) times a day. 06/07/18   [provider]  ?HYDROcodone-acetaminophen (NORCO/VICODIN) 5-325 MG tablet Take 1 tablet by mouth every 6 (six) hours as needed for severe pain. 06/21/19   Evelina Bucy, DPM  ?JARDIANCE 25 MG TABS tablet Take 25 mg by mouth daily. 10/18/18   [provider]  ?labetalol (NORMODYNE) 200 MG tablet Take 200 mg by mouth 2 (two) times daily. 01/21/21   [provider]  ?levothyroxine (SYNTHROID) 75 MCG tablet Take 1 tablet by mouth daily. 11/20/17   [provider]  ?lisinopril-hydrochlorothiazide (ZESTORETIC) 10-12.5 MG tablet Take 1 tablet by mouth 2 (two) times daily. 04/13/19   [provider]  ?metFORMIN (GLUCOPHAGE) 1000 MG tablet Take 1,000 mg by mouth 2 (two) times daily. 01/30/21   [provider]  ?metFORMIN (GLUCOPHAGE) 500 MG tablet Take 1,000 mg by mouth 2 (two) times daily with a meal. 05/14/18   [provider]  ?methocarbamol (ROBAXIN) 500 MG tablet Take 500 mg by mouth 3 (three) times daily. 01/26/21   [provider]  ?miconazole (MICOTIN) 2 % cream Apply 1 application topically 2 (two) times daily as needed (yeast infection due to antibiotic).    [provider]  ?Multiple Vitamin (MULTIVITAMIN WITH MINERALS) TABS tablet Take 1 tablet by mouth daily.    [provider]  ?Multiple Vitamins-Minerals (PRESERVISION AREDS PO) Take 1 tablet by mouth daily.    [provider]  ?pregabalin (LYRICA) 75 MG capsule Take 1 capsule (75 mg total) by mouth 2 (two) times daily. 08/23/19   Landis Martins, DPM  ?RELION INSULIN SYRINGE 1ML/31G 31G X 5/16" 1 ML MISC Inject 1 Syringe into the skin 2 (two) times daily. 01/26/21   [provider]  ?silver sulfADIAZINE (SILVADENE) 1 % cream Apply pea-sized amount to wound daily. 02/14/21   Evelina Bucy, DPM  ? ? ?Allergies ?Patient has no known  allergies. ? ? ?REVIEW OF SYSTEMS  ?Negative except as noted here or in the History of Present Illness. ? ? ?PHYSICAL EXAMINATION  ?Initial Vital Signs ?Blood pressure (!) 181/107, pulse 99, temperature 98 ?F (36.7 ?C), temperature source Oral, resp. rate 20, SpO2 97 %. ? ?Examination ?General: Well-developed, high BMI male in no acute distress; appearance consistent with age of record ?HENT: normocephalic; atraumatic ?Eyes: Normal appearance ?Neck: supple ?Heart: regular rate and rhythm ?Lungs: clear to auscultation bilaterally ?Abdomen: soft; nondistended; nontender; bowel sounds present ?Extremities: Tenderness and swelling centered around base of right thumb without erythema or warmth; tenderness and swelling centered around left ankle without erythema or warmth ?Neurologic: Awake, alert and oriented; motor function intact in all extremities and symmetric; no facial droop ?Skin: Warm  and dry; healing wound of lateral left foot: ? ? ? ?Psychiatric: Normal mood and affect ? ? ?RESULTS  ?Summary of this visit's results, reviewed and interpreted by myself: ? ? EKG Interpretation ? ?Date/Time:    ?Ventricular Rate:    ?PR Interval:    ?QRS Duration:   ?QT Interval:    ?QTC Calculation:   ?R Axis:     ?Text Interpretation:   ?  ? ?  ? ?Laboratory Studies: ?No results found for this or any previous visit (from the past 24 hour(s)). ?Imaging Studies: ?No results found. ? ?ED COURSE and MDM  ?Nursing notes, initial and subsequent vitals signs, including pulse oximetry, reviewed and interpreted by myself. ? ?Vitals:  ? 05/27/21 0343  ?BP: (!) 181/107  ?Pulse: 99  ?Resp: 20  ?Temp: 98 ?F (36.7 ?C)  ?TempSrc: Oral  ?SpO2: 97%  ? ?Medications  ?meloxicam (MOBIC) tablet 7.5 mg (has no administration in time range)  ? ?The patient was advised to continue colchicine as directed.  We will avoid steroids due to his diabetes and we will go gingerly with NSAIDs due to his renal insufficiency.  We will put him on a short course of  low-dose meloxicam and refill his oxycodone.  I suspect his symptoms are due to an acute gout flare.  As noted above he is on allopurinol but there is no indication for allopurinol dose adjustment during acute flare. ? ?P

## 2021-05-27 NOTE — ED Triage Notes (Signed)
Patient arrived stating he was seen on 5/3 for an infected wound on his left foot. Taking antibiotics as prescribed. Friday he began swelling up to his left knee and then in the right hand. No new injuries.  ?

## 2021-06-09 ENCOUNTER — Inpatient Hospital Stay (HOSPITAL_COMMUNITY): Payer: BC Managed Care – PPO

## 2021-06-09 ENCOUNTER — Inpatient Hospital Stay (HOSPITAL_COMMUNITY)
Admission: EM | Admit: 2021-06-09 | Discharge: 2021-06-12 | DRG: 617 | Disposition: A | Discharge status: Home / Self Care | Payer: BC Managed Care – PPO | Attending: Internal Medicine | Admitting: Internal Medicine

## 2021-06-09 ENCOUNTER — Emergency Department (HOSPITAL_COMMUNITY): Payer: BC Managed Care – PPO

## 2021-06-09 DIAGNOSIS — E1169 Type 2 diabetes mellitus with other specified complication: Principal | ICD-10-CM | POA: Diagnosis present

## 2021-06-09 DIAGNOSIS — M86672 Other chronic osteomyelitis, left ankle and foot: Secondary | ICD-10-CM | POA: Diagnosis not present

## 2021-06-09 DIAGNOSIS — E1122 Type 2 diabetes mellitus with diabetic chronic kidney disease: Secondary | ICD-10-CM | POA: Diagnosis present

## 2021-06-09 DIAGNOSIS — I129 Hypertensive chronic kidney disease with stage 1 through stage 4 chronic kidney disease, or unspecified chronic kidney disease: Secondary | ICD-10-CM | POA: Diagnosis not present

## 2021-06-09 DIAGNOSIS — E11621 Type 2 diabetes mellitus with foot ulcer: Secondary | ICD-10-CM | POA: Diagnosis not present

## 2021-06-09 DIAGNOSIS — I10 Essential (primary) hypertension: Secondary | ICD-10-CM | POA: Diagnosis present

## 2021-06-09 DIAGNOSIS — E785 Hyperlipidemia, unspecified: Secondary | ICD-10-CM | POA: Diagnosis present

## 2021-06-09 DIAGNOSIS — G4733 Obstructive sleep apnea (adult) (pediatric): Secondary | ICD-10-CM | POA: Diagnosis present

## 2021-06-09 DIAGNOSIS — M86172 Other acute osteomyelitis, left ankle and foot: Secondary | ICD-10-CM | POA: Diagnosis not present

## 2021-06-09 DIAGNOSIS — L0390 Cellulitis, unspecified: Secondary | ICD-10-CM | POA: Diagnosis not present

## 2021-06-09 DIAGNOSIS — Z7984 Long term (current) use of oral hypoglycemic drugs: Secondary | ICD-10-CM

## 2021-06-09 DIAGNOSIS — E039 Hypothyroidism, unspecified: Secondary | ICD-10-CM | POA: Diagnosis not present

## 2021-06-09 DIAGNOSIS — Z89422 Acquired absence of other left toe(s): Secondary | ICD-10-CM | POA: Diagnosis not present

## 2021-06-09 DIAGNOSIS — D72829 Elevated white blood cell count, unspecified: Secondary | ICD-10-CM

## 2021-06-09 DIAGNOSIS — Z7982 Long term (current) use of aspirin: Secondary | ICD-10-CM

## 2021-06-09 DIAGNOSIS — M109 Gout, unspecified: Secondary | ICD-10-CM | POA: Diagnosis not present

## 2021-06-09 DIAGNOSIS — N189 Chronic kidney disease, unspecified: Secondary | ICD-10-CM

## 2021-06-09 DIAGNOSIS — Z9889 Other specified postprocedural states: Secondary | ICD-10-CM | POA: Diagnosis not present

## 2021-06-09 DIAGNOSIS — L98499 Non-pressure chronic ulcer of skin of other sites with unspecified severity: Secondary | ICD-10-CM | POA: Diagnosis not present

## 2021-06-09 DIAGNOSIS — Z79899 Other long term (current) drug therapy: Secondary | ICD-10-CM | POA: Diagnosis not present

## 2021-06-09 DIAGNOSIS — M009 Pyogenic arthritis, unspecified: Secondary | ICD-10-CM | POA: Diagnosis not present

## 2021-06-09 DIAGNOSIS — M19072 Primary osteoarthritis, left ankle and foot: Secondary | ICD-10-CM | POA: Diagnosis not present

## 2021-06-09 DIAGNOSIS — E11628 Type 2 diabetes mellitus with other skin complications: Principal | ICD-10-CM

## 2021-06-09 DIAGNOSIS — M869 Osteomyelitis, unspecified: Secondary | ICD-10-CM | POA: Diagnosis not present

## 2021-06-09 DIAGNOSIS — M79672 Pain in left foot: Secondary | ICD-10-CM | POA: Diagnosis not present

## 2021-06-09 DIAGNOSIS — Z6841 Body Mass Index (BMI) 40.0 and over, adult: Secondary | ICD-10-CM | POA: Diagnosis not present

## 2021-06-09 DIAGNOSIS — Z79890 Hormone replacement therapy (postmenopausal): Secondary | ICD-10-CM | POA: Diagnosis not present

## 2021-06-09 DIAGNOSIS — E871 Hypo-osmolality and hyponatremia: Secondary | ICD-10-CM | POA: Diagnosis present

## 2021-06-09 DIAGNOSIS — Z791 Long term (current) use of non-steroidal anti-inflammatories (NSAID): Secondary | ICD-10-CM

## 2021-06-09 DIAGNOSIS — E1142 Type 2 diabetes mellitus with diabetic polyneuropathy: Secondary | ICD-10-CM | POA: Diagnosis present

## 2021-06-09 DIAGNOSIS — L97509 Non-pressure chronic ulcer of other part of unspecified foot with unspecified severity: Secondary | ICD-10-CM | POA: Diagnosis not present

## 2021-06-09 DIAGNOSIS — Z794 Long term (current) use of insulin: Secondary | ICD-10-CM

## 2021-06-09 DIAGNOSIS — R609 Edema, unspecified: Secondary | ICD-10-CM

## 2021-06-09 DIAGNOSIS — E1149 Type 2 diabetes mellitus with other diabetic neurological complication: Secondary | ICD-10-CM | POA: Diagnosis not present

## 2021-06-09 DIAGNOSIS — E7800 Pure hypercholesterolemia, unspecified: Secondary | ICD-10-CM | POA: Diagnosis present

## 2021-06-09 DIAGNOSIS — E1140 Type 2 diabetes mellitus with diabetic neuropathy, unspecified: Secondary | ICD-10-CM | POA: Diagnosis present

## 2021-06-09 DIAGNOSIS — L97529 Non-pressure chronic ulcer of other part of left foot with unspecified severity: Secondary | ICD-10-CM | POA: Diagnosis present

## 2021-06-09 DIAGNOSIS — N179 Acute kidney failure, unspecified: Secondary | ICD-10-CM | POA: Diagnosis not present

## 2021-06-09 DIAGNOSIS — L089 Local infection of the skin and subcutaneous tissue, unspecified: Principal | ICD-10-CM

## 2021-06-09 DIAGNOSIS — N1831 Chronic kidney disease, stage 3a: Secondary | ICD-10-CM | POA: Diagnosis present

## 2021-06-09 DIAGNOSIS — E1165 Type 2 diabetes mellitus with hyperglycemia: Secondary | ICD-10-CM | POA: Diagnosis present

## 2021-06-09 DIAGNOSIS — M7989 Other specified soft tissue disorders: Secondary | ICD-10-CM | POA: Diagnosis not present

## 2021-06-09 LAB — CBC WITH DIFFERENTIAL/PLATELET
Abs Immature Granulocytes: 0.04 10*3/uL (ref 0.00–0.07)
Basophils Absolute: 0.1 10*3/uL (ref 0.0–0.1)
Basophils Relative: 1 %
Eosinophils Absolute: 0.3 10*3/uL (ref 0.0–0.5)
Eosinophils Relative: 3 %
HCT: 38.7 % — ABNORMAL LOW (ref 39.0–52.0)
Hemoglobin: 13.4 g/dL (ref 13.0–17.0)
Immature Granulocytes: 0 %
Lymphocytes Relative: 25 %
Lymphs Abs: 2.7 10*3/uL (ref 0.7–4.0)
MCH: 31.7 pg (ref 26.0–34.0)
MCHC: 34.6 g/dL (ref 30.0–36.0)
MCV: 91.5 fL (ref 80.0–100.0)
Monocytes Absolute: 1 10*3/uL (ref 0.1–1.0)
Monocytes Relative: 9 %
Neutro Abs: 6.9 10*3/uL (ref 1.7–7.7)
Neutrophils Relative %: 62 %
Platelets: 302 10*3/uL (ref 150–400)
RBC: 4.23 MIL/uL (ref 4.22–5.81)
RDW: 13.5 % (ref 11.5–15.5)
WBC: 11.1 10*3/uL — ABNORMAL HIGH (ref 4.0–10.5)
nRBC: 0 % (ref 0.0–0.2)

## 2021-06-09 LAB — BASIC METABOLIC PANEL
Anion gap: 8 (ref 5–15)
BUN: 35 mg/dL — ABNORMAL HIGH (ref 6–20)
CO2: 23 mmol/L (ref 22–32)
Calcium: 8.3 mg/dL — ABNORMAL LOW (ref 8.9–10.3)
Chloride: 103 mmol/L (ref 98–111)
Creatinine, Ser: 2.46 mg/dL — ABNORMAL HIGH (ref 0.61–1.24)
GFR, Estimated: 33 mL/min — ABNORMAL LOW (ref >60)
Glucose, Bld: 171 mg/dL — ABNORMAL HIGH (ref 70–99)
Potassium: 3.9 mmol/L (ref 3.5–5.1)
Sodium: 134 mmol/L — ABNORMAL LOW (ref 135–145)

## 2021-06-09 LAB — C-REACTIVE PROTEIN: CRP: 4.4 mg/dL — ABNORMAL HIGH (ref <1.0)

## 2021-06-09 LAB — HEMOGLOBIN A1C
Hgb A1c MFr Bld: 10.7 % — ABNORMAL HIGH (ref 4.8–5.6)
Mean Plasma Glucose: 260.39 mg/dL

## 2021-06-09 LAB — PREALBUMIN: Prealbumin: 29.8 mg/dL (ref 18–38)

## 2021-06-09 LAB — HEPATIC FUNCTION PANEL
ALT: 16 U/L (ref 0–44)
AST: 16 U/L (ref 15–41)
Albumin: 3.2 g/dL — ABNORMAL LOW (ref 3.5–5.0)
Alkaline Phosphatase: 59 U/L (ref 38–126)
Bilirubin, Direct: <0.1 mg/dL (ref 0.0–0.2)
Total Bilirubin: 0.6 mg/dL (ref 0.3–1.2)
Total Protein: 7.7 g/dL (ref 6.5–8.1)

## 2021-06-09 LAB — GLUCOSE, CAPILLARY
Glucose-Capillary: 123 mg/dL — ABNORMAL HIGH (ref 70–99)
Glucose-Capillary: 154 mg/dL — ABNORMAL HIGH (ref 70–99)
Glucose-Capillary: 177 mg/dL — ABNORMAL HIGH (ref 70–99)

## 2021-06-09 LAB — SEDIMENTATION RATE: Sed Rate: 85 mm/hr — ABNORMAL HIGH (ref 0–16)

## 2021-06-09 LAB — LACTIC ACID, PLASMA
Lactic Acid, Venous: 1.4 mmol/L (ref 0.5–1.9)
Lactic Acid, Venous: 1.3 mmol/L (ref 0.5–1.9)

## 2021-06-09 LAB — HIV ANTIBODY (ROUTINE TESTING W REFLEX): HIV Screen 4th Generation wRfx: NONREACTIVE

## 2021-06-09 IMAGING — MR MR FOOT*L* W/O CM
5 series · 38 of 40 positions shown · non-contrast
Comparison: X-ray [DATE]

CLINICAL DATA: Foot swelling, diabetic, osteomyelitis suspected,
xray done

EXAM:
MRI OF THE LEFT FOOT WITHOUT CONTRAST
TECHNIQUE: Multiplanar, multisequence MR imaging of the left forefoot was
performed. No intravenous contrast was administered.

[Series 3: T1 · coronal · left · 3.0mm · 0.47mm/px · 8 of 36 slices shown (1 of 2)]
[im 1/36]
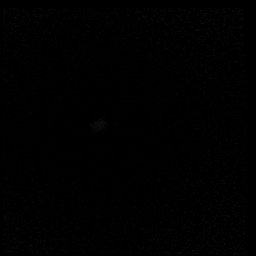
[im 6/36]
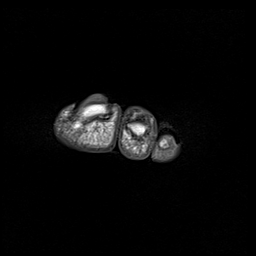
[im 11/36]
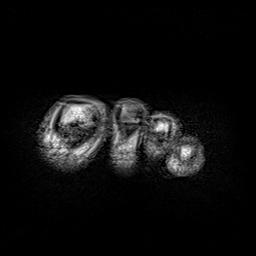
[im 16/36]
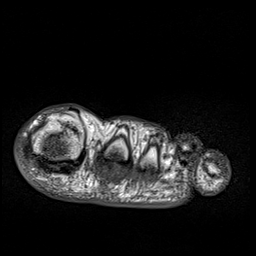
[im 21/36]
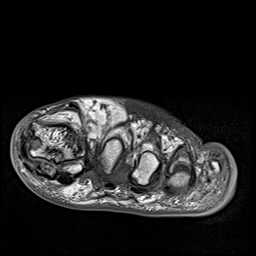
[im 26/36]
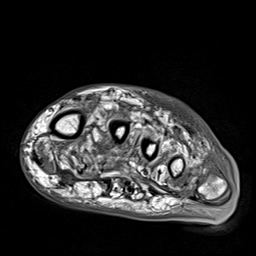
[im 31/36]
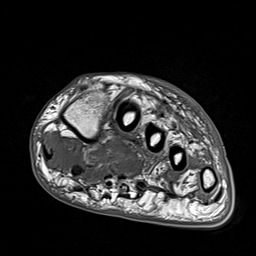
[im 36/36]
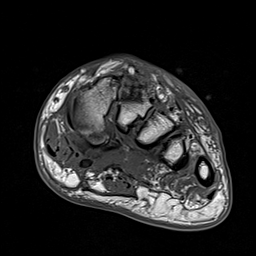

[Series 4: T2 fat-sat · coronal · left · 3.0mm · 0.38mm/px · 8 of 38 slices shown (1 of 2)]
[im 1/38]
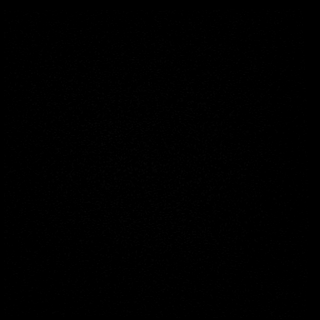
[im 5/38]
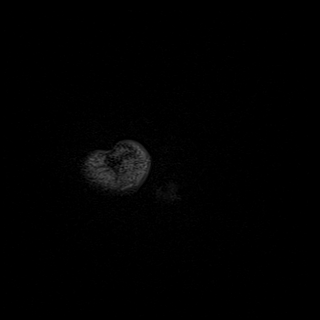
[im 13/38]
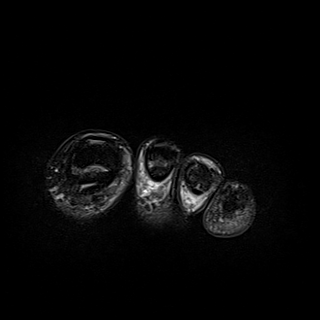
[im 17/38]
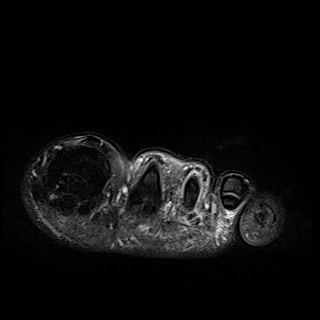
[im 21/38]
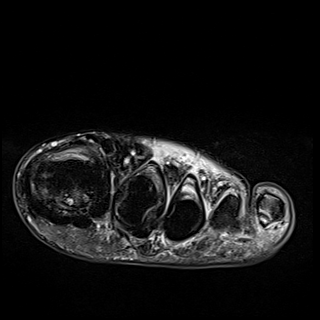
[im 25/38]
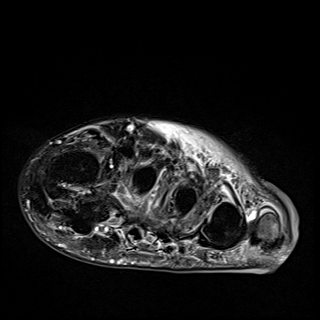
[im 33/38]
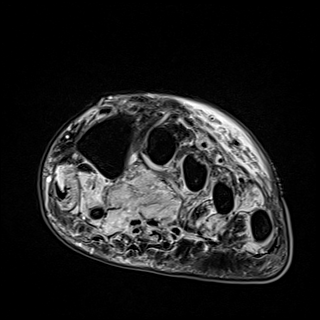
[im 38/38]
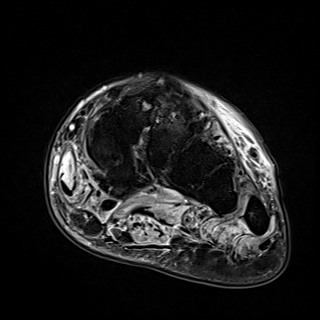

[Series 5: T2 fat-sat · axial · left · 3.0mm · 0.70mm/px · z∈[-30,+59]mm · 7 of 27 slices shown (2 of 2)]
[im 1/27]
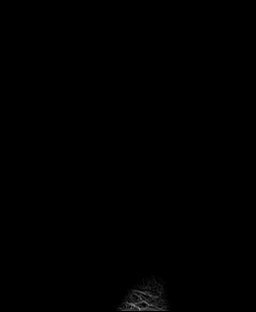
[im 5/27]
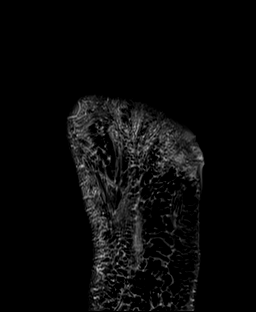
[im 9/27]
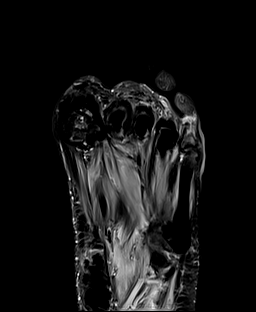
[im 14/27]
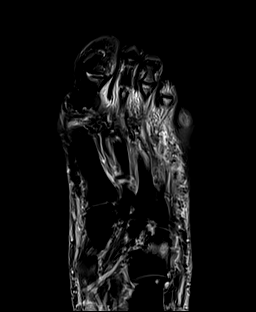
[im 18/27]
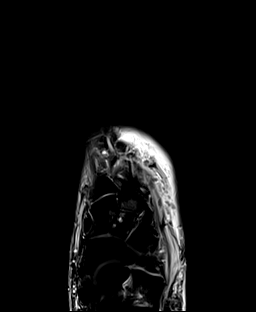
[im 22/27]
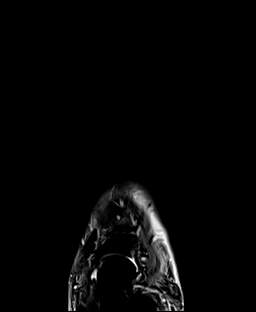
[im 27/27]
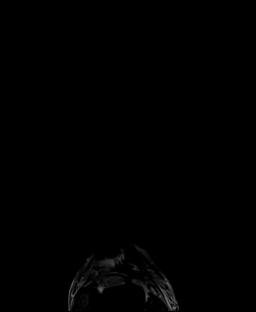

[Series 6: T1 · axial · left · 3.0mm · 0.70mm/px · z∈[-30,+59]mm · 7 of 27 slices shown (2 of 2)]
[im 1/27]
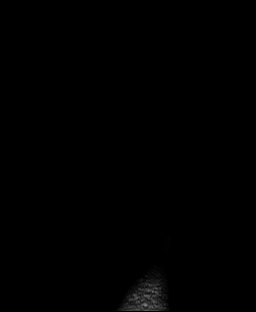
[im 5/27]
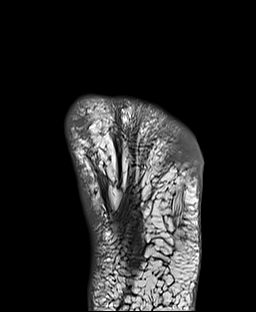
[im 9/27]
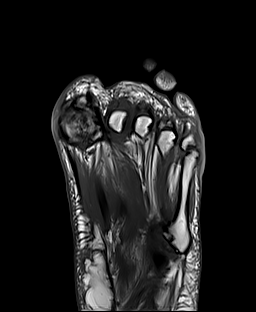
[im 14/27]
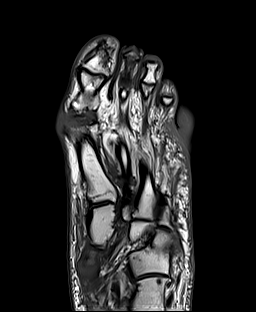
[im 18/27]
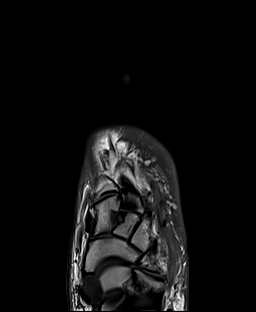
[im 22/27]
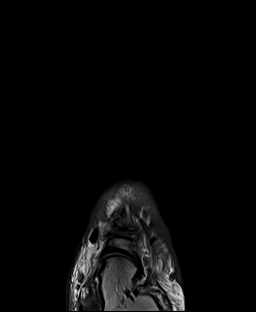
[im 27/27]
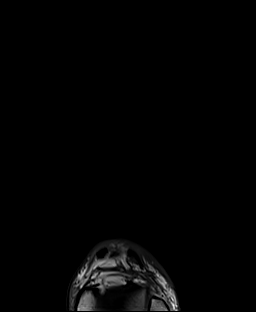

[Series 7: STIR · sagittal · left · 3.0mm · 0.35mm/px · 8 of 32 slices shown]
[im 1/32]
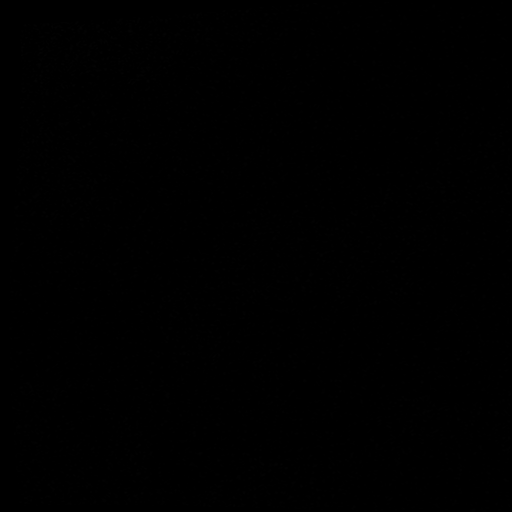
[im 5/32]
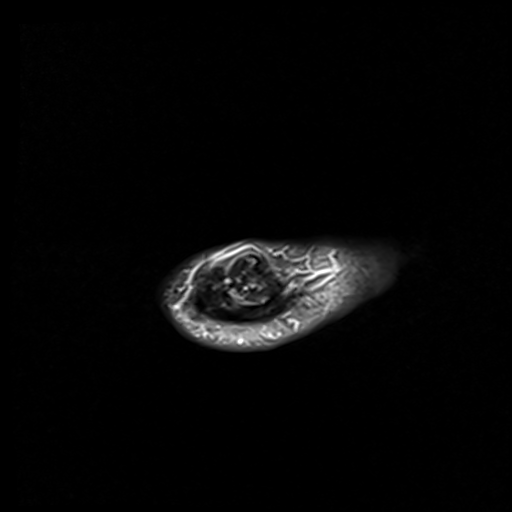
[im 9/32]
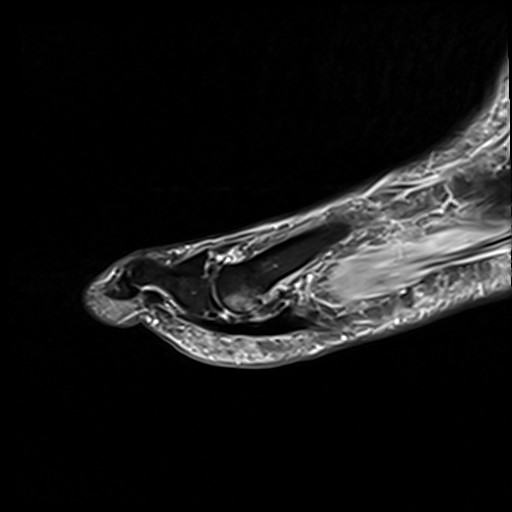
[im 14/32]
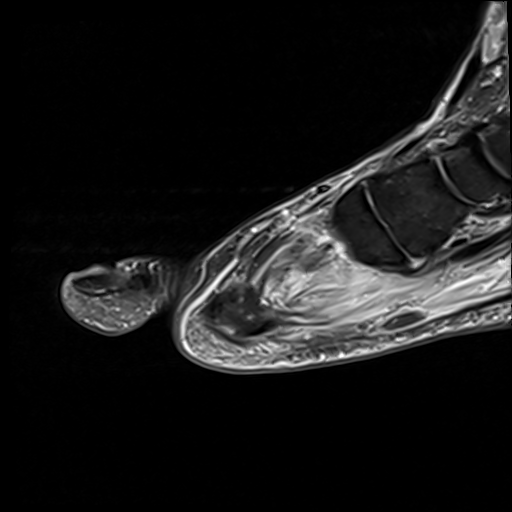
[im 18/32]
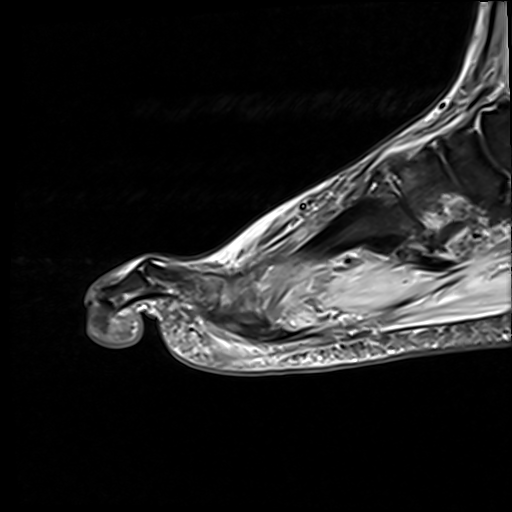
[im 23/32]
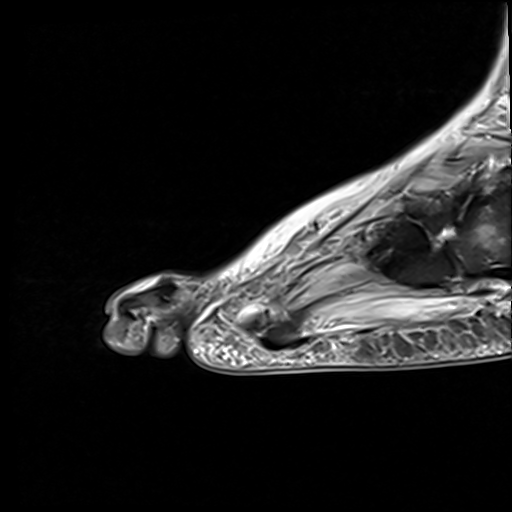
[im 27/32]
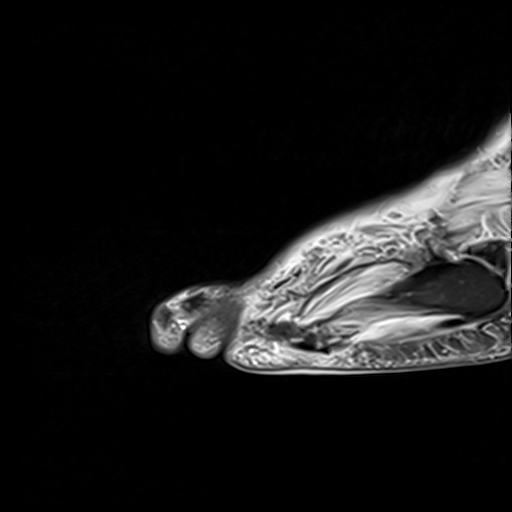
[im 32/32]
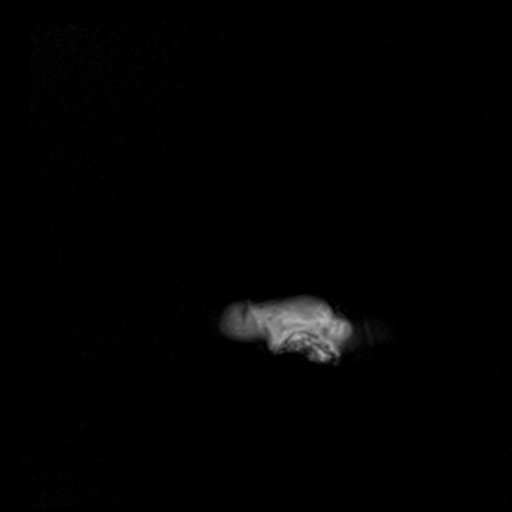

[38 of 40 positions shown; findings below may reference images not displayed]

FINDINGS: Bones/Joint/Cartilage

Shallow soft tissue ulceration at the plantar-lateral aspect of the
distal forefoot adjacent to the fifth metatarsal head with bone
marrow edema present in the fifth metatarsal head and fifth toe
proximal phalanx. No erosion. Fatty T1 marrow signal at the sites is
preserved. Small fifth MTP joint effusion.

Moderate-severe arthropathy of the first MTP joint with well-defined
marginal erosions along the first metatarsal head suggesting sequela
of prior crystalline arthropathy. Hallux valgus deformity. Mild
degenerative changes within the midfoot. Mild edema within the
cuboid is likely reactive or stress related.

Ligaments

Intact Lisfranc ligament. Collateral ligaments of the forefoot
appear intact.

Muscles and Tendons

Denervation changes of the foot musculature. Intact flexor and
extensor tendons. No tenosynovitis.

Soft tissues

Soft tissue ulceration, as above. Diffuse soft tissue swelling.
Ill-defined fluid and edema over the dorsal aspect of the forefoot.
No organized fluid collection evident on noncontrast images.
IMPRESSION: 1. Shallow soft tissue ulceration at the plantar-lateral aspect of
the distal forefoot adjacent to the fifth metatarsal head. Marrow
edema in the fifth metatarsal head and fifth toe proximal phalanx
suspicious for early acute osteomyelitis.
2. Small fifth MTP joint effusion concerning for septic arthritis.
3. Hallux valgus deformity with moderate-severe arthropathy of the
first MTP joint with well-defined marginal erosions along the first
metatarsal head suggesting sequela of prior crystalline arthropathy
such as gout.

## 2021-06-09 IMAGING — DX DG FOOT COMPLETE 3+V*L*
3 series · 3 of 3 positions shown · non-contrast
Comparison: None Available.

CLINICAL DATA: Left foot pain. Concern for osteomyelitis. Chronic
wound to the distal aspect of the plantar surface of the left foot
next to the fifth toe. Discharge and pain.

EXAM:
LEFT FOOT - COMPLETE 3+ VIEW

[foot ap]
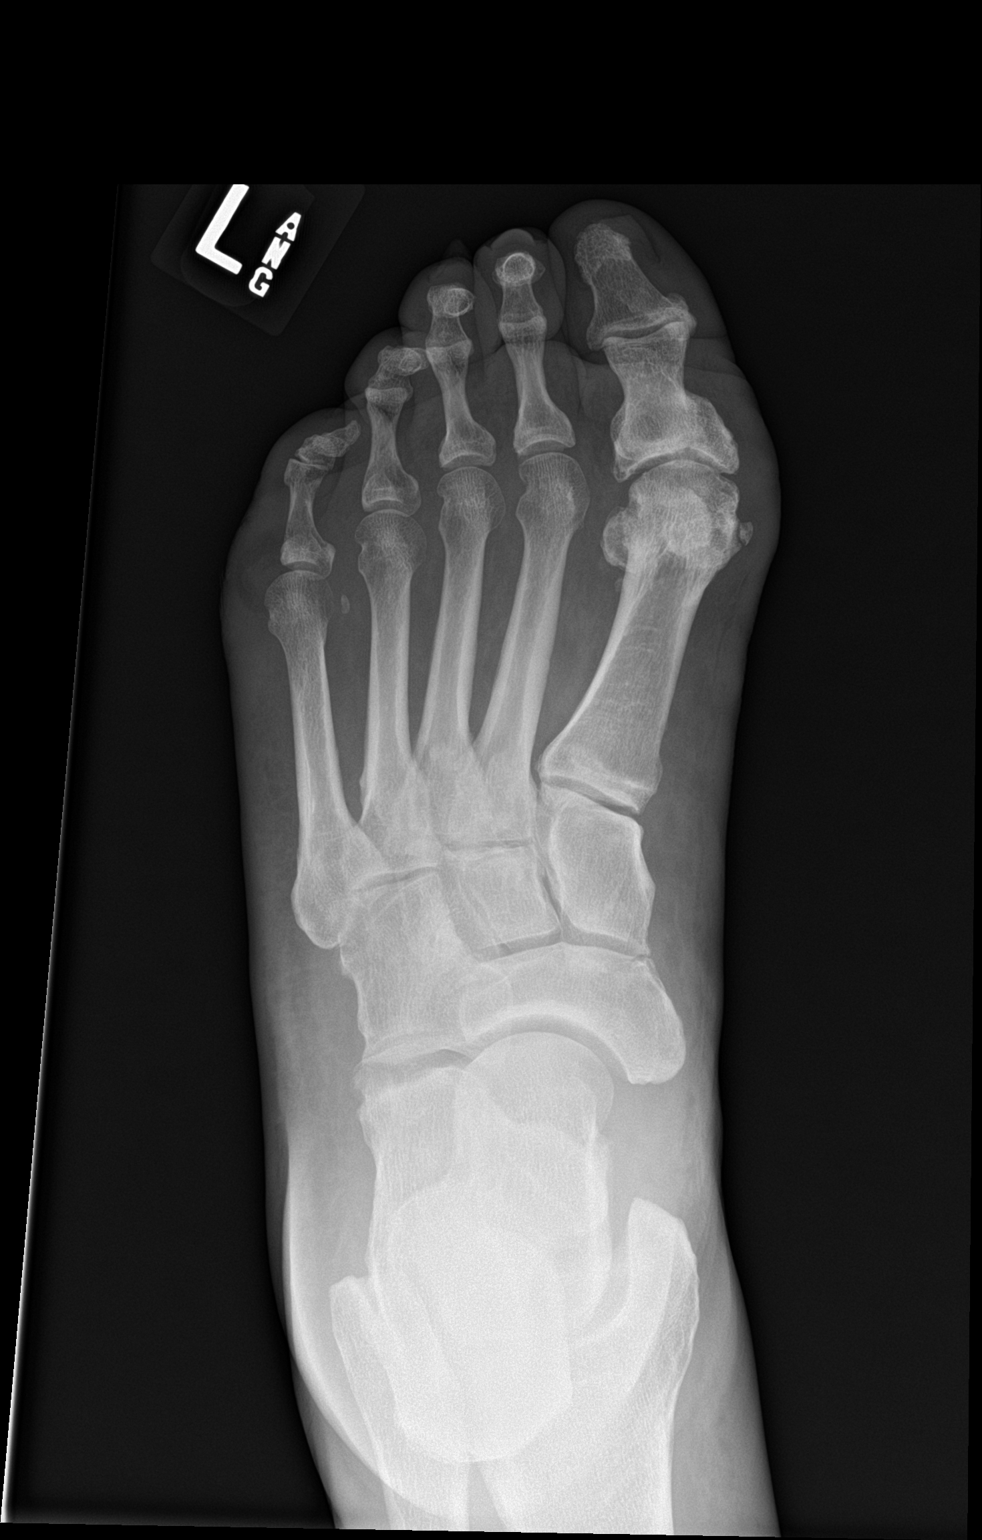

[foot obl]
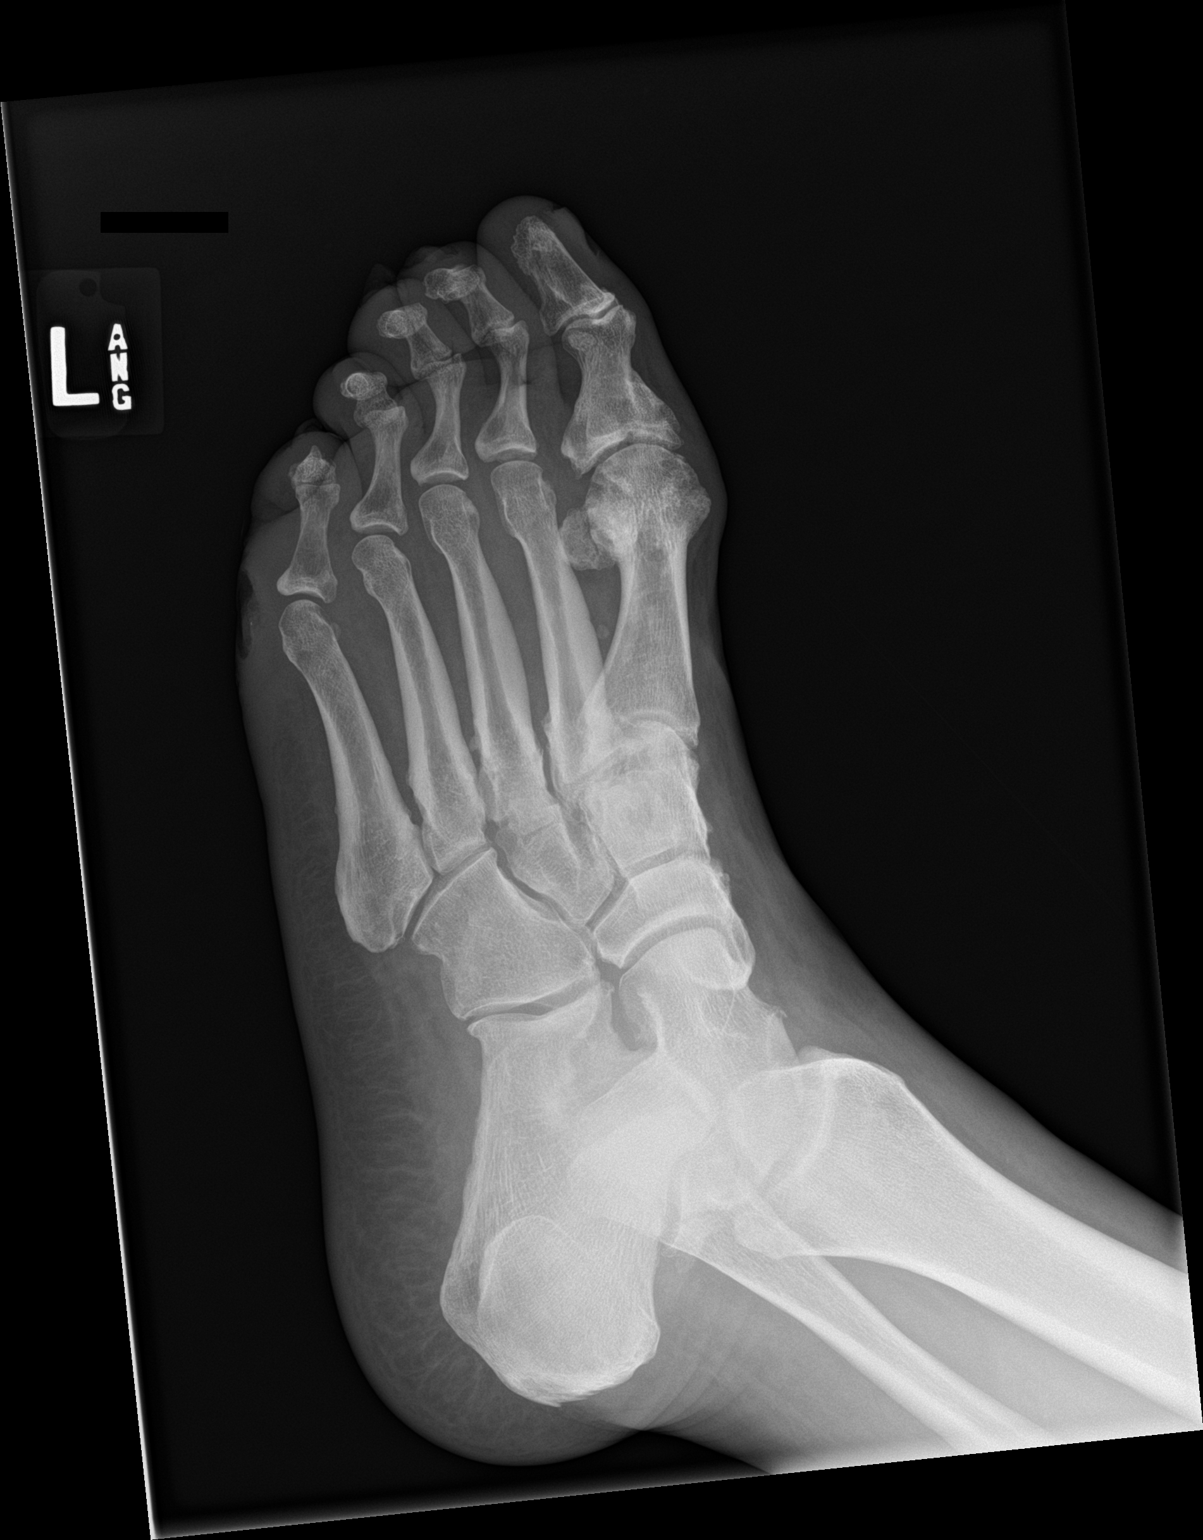

[foot lat]
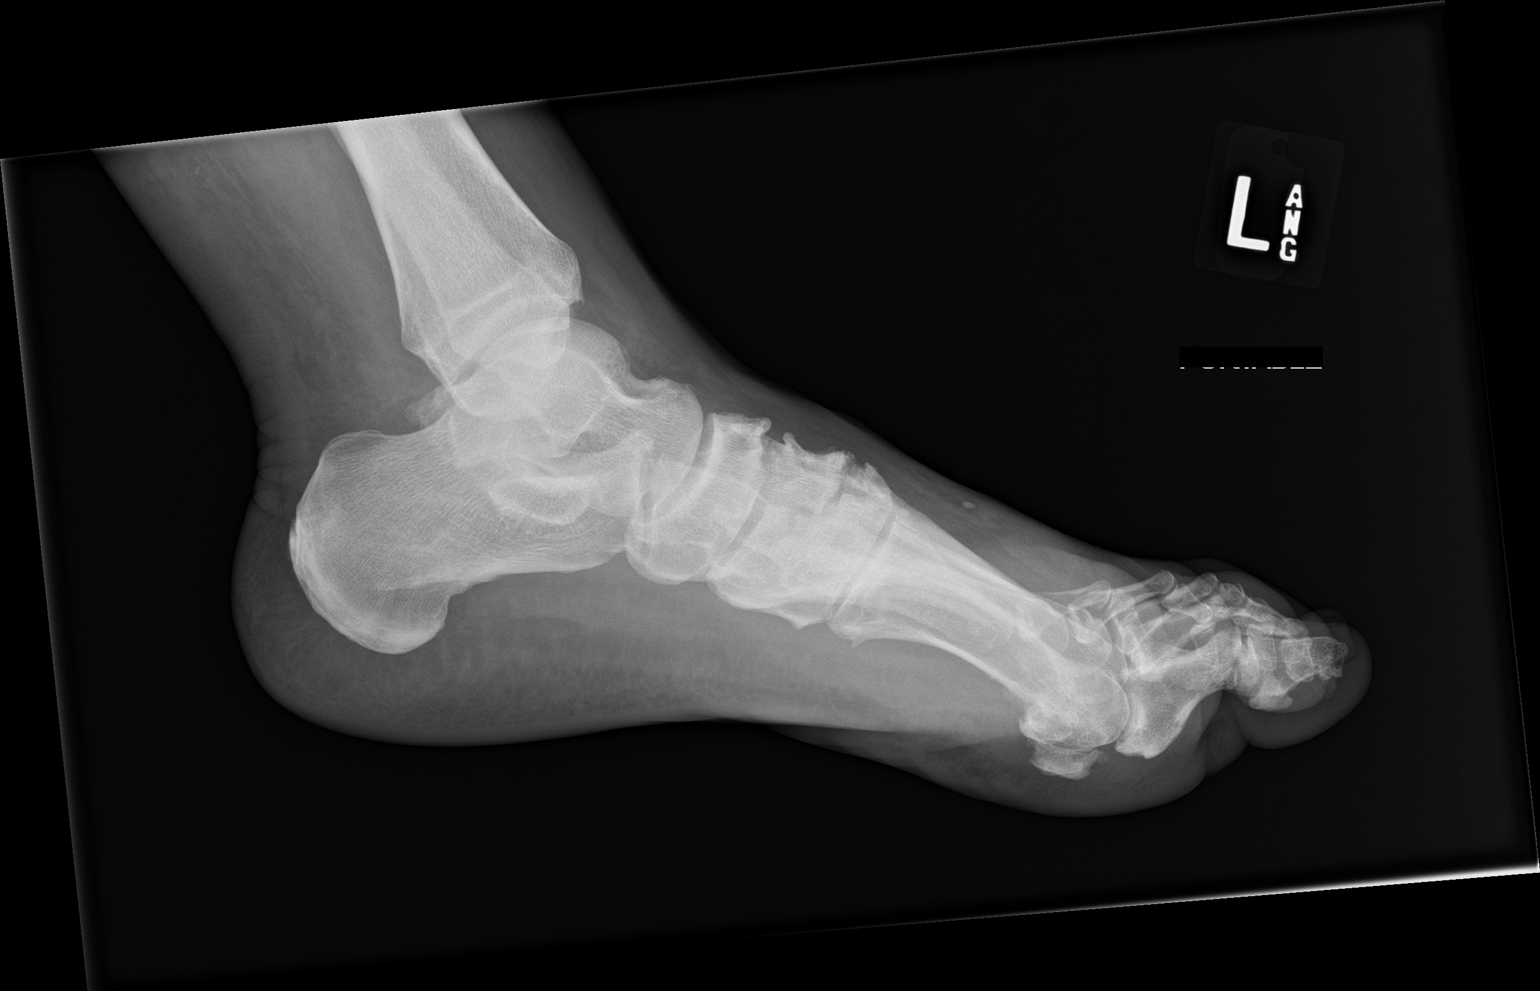

[3 of 3 positions shown; findings below may reference images not displayed]

FINDINGS: Degenerative changes at the first MTP joint. No fractures. The
reported chronic wound is identified near the fifth MTP joint. A
punctate focus of high attenuation is identified within the wound on
the oblique view. There is no bony erosion underlying the wound.
Degenerative changes in the midfoot.
IMPRESSION: 1. The patient's reported chronic wound is identified adjacent to
the fifth MTP joint. There is a punctate focus of high attenuation
projected over the wound on the oblique view which could represent a
tiny foreign body.
2. No bony erosion to suggest osteomyelitis. MRI would be more
sensitive.

## 2021-06-09 MED ORDER — HYDROMORPHONE HCL 1 MG/ML IJ SOLN
1.0000 mg | INTRAMUSCULAR | Status: AC | PRN
  Administered 2021-06-09 (×2): 1 mg via INTRAVENOUS
  Filled 2021-06-09 (×2): qty 1

## 2021-06-09 MED ORDER — VANCOMYCIN HCL IN DEXTROSE 1-5 GM/200ML-% IV SOLN
1000.0000 mg | Freq: Four times a day (QID) | INTRAVENOUS | Status: AC
  Administered 2021-06-09: 1000 mg via INTRAVENOUS
  Filled 2021-06-09: qty 200

## 2021-06-09 MED ORDER — VANCOMYCIN HCL 1500 MG/300ML IV SOLN
1500.0000 mg | INTRAVENOUS | Status: DC
  Administered 2021-06-10 – 2021-06-11 (×2): 1500 mg via INTRAVENOUS
  Filled 2021-06-09 (×3): qty 300

## 2021-06-09 MED ORDER — LEVOTHYROXINE SODIUM 50 MCG PO TABS
75.0000 ug | ORAL_TABLET | Freq: Every day | ORAL | Status: DC
  Administered 2021-06-10 – 2021-06-12 (×3): 75 ug via ORAL
  Filled 2021-06-09 (×3): qty 1

## 2021-06-09 MED ORDER — ALLOPURINOL 300 MG PO TABS
300.0000 mg | ORAL_TABLET | Freq: Every day | ORAL | Status: DC
Start: 2021-06-09 — End: 2021-06-12
  Administered 2021-06-09 – 2021-06-12 (×4): 300 mg via ORAL
  Filled 2021-06-09 (×4): qty 1

## 2021-06-09 MED ORDER — SODIUM CHLORIDE 0.9 % IV SOLN
2.0000 g | Freq: Three times a day (TID) | INTRAVENOUS | Status: DC
  Administered 2021-06-09 – 2021-06-12 (×9): 2 g via INTRAVENOUS
  Filled 2021-06-09 (×7): qty 12.5

## 2021-06-09 MED ORDER — OXYCODONE HCL 5 MG PO TABS: 5.0000 mg | ORAL_TABLET | ORAL | Status: DC | PRN

## 2021-06-09 MED ORDER — LACTATED RINGERS IV BOLUS
1000.0000 mL | Freq: Once | INTRAVENOUS | Status: AC
  Administered 2021-06-09: 1000 mL via INTRAVENOUS

## 2021-06-09 MED ORDER — LACTATED RINGERS IV SOLN: INTRAVENOUS | Status: AC

## 2021-06-09 MED ORDER — ONDANSETRON HCL 4 MG/2ML IJ SOLN: 4.0000 mg | Freq: Four times a day (QID) | INTRAMUSCULAR | Status: DC | PRN

## 2021-06-09 MED ORDER — VANCOMYCIN HCL IN DEXTROSE 1-5 GM/200ML-% IV SOLN
1000.0000 mg | Freq: Four times a day (QID) | INTRAVENOUS | Status: DC
  Administered 2021-06-09: 1000 mg via INTRAVENOUS
  Filled 2021-06-09: qty 200

## 2021-06-09 MED ORDER — SODIUM CHLORIDE 0.9 % IV SOLN: Freq: Once | INTRAVENOUS | Status: AC

## 2021-06-09 MED ORDER — ENOXAPARIN SODIUM 40 MG/0.4ML IJ SOSY: 40.0000 mg | PREFILLED_SYRINGE | INTRAMUSCULAR | Status: DC

## 2021-06-09 MED ORDER — OXYCODONE HCL 5 MG PO TABS
10.0000 mg | ORAL_TABLET | Freq: Four times a day (QID) | ORAL | Status: DC | PRN
  Administered 2021-06-10 – 2021-06-11 (×4): 10 mg via ORAL
  Filled 2021-06-09 (×4): qty 2

## 2021-06-09 MED ORDER — METRONIDAZOLE 500 MG/100ML IV SOLN
500.0000 mg | Freq: Two times a day (BID) | INTRAVENOUS | Status: DC
Start: 2021-06-09 — End: 2021-06-12
  Administered 2021-06-09 – 2021-06-12 (×7): 500 mg via INTRAVENOUS
  Filled 2021-06-09 (×7): qty 100

## 2021-06-09 MED ORDER — INSULIN ASPART 100 UNIT/ML IJ SOLN
0.0000 [IU] | Freq: Three times a day (TID) | INTRAMUSCULAR | Status: DC
  Administered 2021-06-09: 3 [IU] via SUBCUTANEOUS
  Administered 2021-06-09 – 2021-06-10 (×2): 4 [IU] via SUBCUTANEOUS
  Administered 2021-06-10: 7 [IU] via SUBCUTANEOUS
  Administered 2021-06-11 (×2): 4 [IU] via SUBCUTANEOUS
  Administered 2021-06-11: 7 [IU] via SUBCUTANEOUS
  Administered 2021-06-12: 4 [IU] via SUBCUTANEOUS
  Filled 2021-06-09: qty 0.2

## 2021-06-09 MED ORDER — ACETAMINOPHEN 650 MG RE SUPP: 650.0000 mg | Freq: Four times a day (QID) | RECTAL | Status: DC | PRN

## 2021-06-09 MED ORDER — FENTANYL CITRATE PF 50 MCG/ML IJ SOSY
50.0000 ug | PREFILLED_SYRINGE | Freq: Once | INTRAMUSCULAR | Status: AC
  Administered 2021-06-09: 50 ug via INTRAVENOUS
  Filled 2021-06-09: qty 1

## 2021-06-09 MED ORDER — ACETAMINOPHEN 500 MG PO TABS: 1000.0000 mg | ORAL_TABLET | Freq: Four times a day (QID) | ORAL | Status: DC | PRN

## 2021-06-09 MED ORDER — ASPIRIN 81 MG PO CHEW
81.0000 mg | CHEWABLE_TABLET | Freq: Every day | ORAL | Status: DC
  Administered 2021-06-09 – 2021-06-12 (×4): 81 mg via ORAL
  Filled 2021-06-09 (×4): qty 1

## 2021-06-09 MED ORDER — EMPAGLIFLOZIN 25 MG PO TABS
25.0000 mg | ORAL_TABLET | Freq: Every day | ORAL | Status: DC
  Administered 2021-06-10 – 2021-06-12 (×3): 25 mg via ORAL
  Filled 2021-06-09 (×3): qty 1

## 2021-06-09 MED ORDER — ATORVASTATIN CALCIUM 40 MG PO TABS
80.0000 mg | ORAL_TABLET | Freq: Every day | ORAL | Status: DC
  Administered 2021-06-09 – 2021-06-12 (×4): 80 mg via ORAL
  Filled 2021-06-09 (×4): qty 2

## 2021-06-09 MED ORDER — ONDANSETRON HCL 4 MG PO TABS: 4.0000 mg | ORAL_TABLET | Freq: Four times a day (QID) | ORAL | Status: DC | PRN

## 2021-06-09 NOTE — Progress Notes (Signed)
Pharmacy Antibiotic Note  Barry [name] is a 46 y.o. male admitted on 06/09/2021 with diabetic foot wound. Pharmacy has been consulted for vancomycin dosing. Noted to have AKI on CKD on admission.  Plan: Vancomycin 1000 + 1000 mg IV now, then 1500 mg IV q24 hr (est AUC 457 based on SCr 2.46; Vd 0.5) Measure vancomycin AUC at steady state as indicated SCr daily while on vanc in setting of AKI Cefepime/Flagyl per MD; dosing appropriate   Height: 6\' 3"  (190.5 cm) Weight: (!) 154.2 kg (340 lb) IBW/kg (Calculated) : 84.5  Temp (24hrs), Avg:97.6 F (36.4 C), Min:97.6 F (36.4 C), Max:97.6 F (36.4 C)  Recent Labs  Lab 06/09/21 0747  WBC 11.1*  CREATININE 2.46*  LATICACIDVEN 1.4    Estimated Creatinine Clearance: 61.6 mL/min (A) (by C-G formula based on SCr of 2.46 mg/dL (H)).    No Known Allergies  Antimicrobials this admission: 5/21 vancomycin >>  5/21 cefepime >>  5/21 metronidazole >>  Dose adjustments this admission: n/a  Microbiology results: 5/21 BCx: sent   Thank you for allowing pharmacy to be a part of this patient's care.  Jilliann Subramanian A 06/09/2021 10:26 AM

## 2021-06-09 NOTE — ED Triage Notes (Signed)
Pt reports a chronic wound on his L foot. He feels that it is infected again. Reports discharge, smell, and pain. Also reports R hand pain, especially in his knuckles for weeks. Also reports that he is feeling weaker than normal. A&Ox4.

## 2021-06-09 NOTE — Consult Note (Signed)
Subjective:  Patient ID: Barry [name], male    DOB: 1975/03/20,  MRN: [id number]  Patient with past medical history of DM type 2, neuropathy, obesity, thyroid disease seen at beside today for worsening left foot wound. He has been followed in the past with Dr. Marylene Land and Dr. Samuella Cota. He relates he has been on multiple courses of antibiotics to help with this wound but infection keeps returning. Presents to ED for worsening pain in foot and nausea.   Past Medical History:  Diagnosis Date   Carpal tunnel syndrome    Diabetes mellitus without complication (HCC)    Gout    Hypercholesteremia    Hypothyroidism    Neuropathy    Obesity    Thyroid disease      Past Surgical History:  Procedure Laterality Date   FRACTURE SURGERY     INCISE AND DRAIN ABCESS     IRRIGATION AND DEBRIDEMENT FOOT Right 04/27/2019   Procedure: Debridement and Irrigation with possible placement of antibiotic beads;  Surgeon: Park Liter, DPM;  Location: [facility];  Service: Podiatry;  Laterality: Right;   ORIF TOE FRACTURE Right 04/27/2019   Procedure: Open Reduction vs Excision of Fracture Fragment Right great toe, with pinning.;  Surgeon: Park Liter, DPM;  Location: [facility];  Service: Podiatry;  Laterality: Right;   TONSILLECTOMY         Latest Ref Rng & Units 06/09/2021    7:47 AM 05/22/2021    4:16 AM 04/27/2019    8:11 AM  CBC  WBC 4.0 - 10.5 K/uL 11.1   8.1     Hemoglobin 13.0 - 17.0 g/dL 78.2   95.6   21.3    Hematocrit 39.0 - 52.0 % 38.7   39.6   42.0    Platelets 150 - 400 K/uL 302   319          Latest Ref Rng & Units 06/09/2021    7:47 AM 05/22/2021    4:16 AM 04/27/2019    8:20 AM  BMP  Glucose 70 - 99 mg/dL 086   578   469    BUN 6 - 20 mg/dL 35   29   47    Creatinine 0.61 - 1.24 mg/dL 6.29   5.28   4.13    Sodium 135 - 145 mmol/L 134   135   139    Potassium 3.5 - 5.1 mmol/L 3.9   3.6   5.1    Chloride 98 - 111 mmol/L 103   103   111    CO2 22 - 32 mmol/L 23   24   20     Calcium 8.9 -  10.3 mg/dL 8.3   8.7   9.3       Objective:   Vitals:   06/09/21 0915 06/09/21 0930  BP: 103/63 104/63  Pulse: 91 91  Resp: 15 14  Temp:    SpO2: 97% 97%    General:AA&O x 3. Normal mood and affect   Vascular: DP and PT pulses 2/4 bilateral. Brisk capillary refill to all digits. Pedal hair present   Neruological. Epicritic sensation grossly intact.   Derm: Plantar left fifth metatarsal wound wound with necrotic fibrous base measuring 2.5 cm x 2 cm x 0.4 cm post debridement . Mild erythema and edema present periwound. No tracking proximally. No purulence. Negative probe to bone. Interspaces clears of maceration. Nails well groomed and normal in appearance  MSK: MMT 5/5 in dorsiflexion, plantar flexion, inversion and  eversion. Normal joint ROM without pain or crepitus.    Procedure: Excisional Debridement of Wound Rationale: Removal of non-viable soft tissue from the wound to promote healing.  Anesthesia: none Pre-Debridement Wound Measurements: Overylying necrosis   Post-Debridement Wound Measurements: 2.5 cm x 2 cm x 0.4 cm  Type of Debridement: Sharp Excisional Tissue Removed: Non-viable soft tissue Depth of Debridement: subcutaneous tissue. Technique: Sharp excisional debridement to bleeding, viable wound base.  Dressing: Dry, sterile, compression dressing. Disposition: Patient tolerated procedure well.   X-ray Left foot.  IMPRESSION: 1. The patient's reported chronic wound is identified adjacent to the fifth MTP joint. There is a punctate focus of high attenuation projected over the wound on the oblique view which could represent a tiny foreign body. 2. No bony erosion to suggest osteomyelitis. MRI would be more sensitive.  Assessment & Plan:  Patient was evaluated and treated and all questions answered.  DX: Left fifth metatarsal diabetic wound infection  Wound care: betadine, DSD  Antibiotics: Broad spectrum IV antibiotics DME: post-op shoe   Discussed with  patient diagnosis and treatment options.  Imaging reviewed. Radiographs negative for any osseous erosions. Pending MRI.  Discussed with patient treatment course. Will await results of MRI. Discussed if negative for any underlying bone infection will likely be ok for discharge on oral antibiotics and close follow-up for wound care. If any underlying bone infection in left foot discussed possibility of needing amputation of fifth ray. Patient expressed understanding.    Patient in agreement with plan and all questions answered.  Podiatry will continue to follow  Louann Sjogren, DPM  Accessible via secure chat for questions or concerns.

## 2021-06-09 NOTE — ED Notes (Signed)
ED TO INPATIENT HANDOFF REPORT  ED Nurse Name and Phone #: Delice Bison, RN  S Name/Age/Gender Barry [name] 46 y.o. male Room/Bed: WA12/WA12  Code Status   Code Status: Full Code  Home/SNF/Other Home Patient oriented to: self, place, time, and situation Is this baseline? Yes   Triage Complete: Triage complete  Chief Complaint Diabetic ulcer of left foot associated with type 2 diabetes mellitus (HCC) [Y18.563, L97.529]  Triage Note Pt reports a chronic wound on his L foot. He feels that it is infected again. Reports discharge, smell, and pain. Also reports R hand pain, especially in his knuckles for weeks. Also reports that he is feeling weaker than normal. A&Ox4.    Allergies No Known Allergies  Level of Care/Admitting Diagnosis ED Disposition     ED Disposition  Admit   Condition  --   Comment  Hospital Area: [facility] [[id number]]  Level of Care: Telemetry [5]  Admit to tele based on following criteria: Monitor for Ischemic changes  May admit patient to [facility] or [facility] if equivalent level of care is available:: No  Covid Evaluation: Asymptomatic - no recent exposure (last 10 days) testing not required  Diagnosis: Diabetic ulcer of left foot associated with type 2 diabetes mellitus [facility]) [[id number]]  Admitting Physician: Bobette Mo [[id number]]  Attending Physician: Bobette Mo [[id number]]  Estimated length of stay: past midnight tomorrow  Certification:: I certify this patient will need inpatient services for at least 2 midnights          B Medical/Surgery History Past Medical History:  Diagnosis Date   Carpal tunnel syndrome    Diabetes mellitus without complication (HCC)    Gout    Hypercholesteremia    Hypothyroidism    Neuropathy    Obesity    Thyroid disease    Past Surgical History:  Procedure Laterality Date   FRACTURE SURGERY     INCISE AND DRAIN ABCESS     IRRIGATION AND DEBRIDEMENT FOOT Right 04/27/2019    Procedure: Debridement and Irrigation with possible placement of antibiotic beads;  Surgeon: Park Liter, DPM;  Location: [facility];  Service: Podiatry;  Laterality: Right;   ORIF TOE FRACTURE Right 04/27/2019   Procedure: Open Reduction vs Excision of Fracture Fragment Right great toe, with pinning.;  Surgeon: Park Liter, DPM;  Location: [facility];  Service: Podiatry;  Laterality: Right;   TONSILLECTOMY       A IV Location/Drains/Wounds Patient Lines/Drains/Airways Status     Active Line/Drains/Airways     Name Placement date Placement time Site Days   Peripheral IV 06/09/21 20 G 1" Anterior;Left Forearm 06/09/21  0752  Forearm  less than 1   Incision (Closed) 04/27/19 Foot Right 04/27/19  1119  -- 774            Intake/Output Last 24 hours  Intake/Output Summary (Last 24 hours) at 06/09/2021 1028 Last data filed at 06/09/2021 0935 Gross per 24 hour  Intake 1000 ml  Output --  Net 1000 ml    Labs/Imaging Results for orders placed or performed during the hospital encounter of 06/09/21 (from the past 48 hour(s))  CBC with Differential     Status: Abnormal   Collection Time: 06/09/21  7:47 AM  Result Value Ref Range   WBC 11.1 (H) 4.0 - 10.5 K/uL   RBC 4.23 4.22 - 5.81 MIL/uL   Hemoglobin 13.4 13.0 - 17.0 g/dL   HCT 87.8 (L) 67.6 - 72.0 %  MCV 91.5 80.0 - 100.0 fL   MCH 31.7 26.0 - 34.0 pg   MCHC 34.6 30.0 - 36.0 g/dL   RDW 19.113.5 47.811.5 - 29.515.5 %   Platelets 302 150 - 400 K/uL   nRBC 0.0 0.0 - 0.2 %   Neutrophils Relative % 62 %   Neutro Abs 6.9 1.7 - 7.7 K/uL   Lymphocytes Relative 25 %   Lymphs Abs 2.7 0.7 - 4.0 K/uL   Monocytes Relative 9 %   Monocytes Absolute 1.0 0.1 - 1.0 K/uL   Eosinophils Relative 3 %   Eosinophils Absolute 0.3 0.0 - 0.5 K/uL   Basophils Relative 1 %   Basophils Absolute 0.1 0.0 - 0.1 K/uL   Immature Granulocytes 0 %   Abs Immature Granulocytes 0.04 0.00 - 0.07 K/uL    Comment: Performed at [facility], [address] [place], [place] [address]  Basic metabolic panel     Status: Abnormal   Collection Time: 06/09/21  7:47 AM  Result Value Ref Range   Sodium 134 (L) 135 - 145 mmol/L   Potassium 3.9 3.5 - 5.1 mmol/L   Chloride 103 98 - 111 mmol/L   CO2 23 22 - 32 mmol/L   Glucose, Bld 171 (H) 70 - 99 mg/dL    Comment: Glucose reference range applies only to samples taken after fasting for at least 8 hours.   BUN 35 (H) 6 - 20 mg/dL   Creatinine, Ser 8.652.46 (H) 0.61 - 1.24 mg/dL   Calcium 8.3 (L) 8.9 - 10.3 mg/dL   GFR, Estimated 33 (L) >60 mL/min    Comment: (NOTE) Calculated using the CKD-EPI Creatinine Equation (2021)    Anion gap 8 5 - 15    Comment: Performed at [facility], [address] [place], [place] [address]  Lactic acid, plasma     Status: None   Collection Time: 06/09/21  7:47 AM  Result Value Ref Range   Lactic Acid, Venous 1.4 0.5 - 1.9 mmol/L    Comment: Performed at [facility], [address] [place], [place] [address]   DG Foot Complete Left  Result Date: 06/09/2021 CLINICAL DATA:  Left foot pain. Concern for osteomyelitis. Chronic wound to the distal aspect of the plantar surface of the left foot next to the fifth toe. Discharge and pain. EXAM: LEFT FOOT - COMPLETE 3+ VIEW COMPARISON:  None Available. FINDINGS: Degenerative changes at the first MTP joint. No fractures. The reported chronic wound is identified near the fifth MTP joint. A punctate focus of high attenuation is identified within the wound on the oblique view. There is no bony erosion underlying the wound. Degenerative changes in the midfoot. IMPRESSION: 1. The patient's reported chronic wound is identified adjacent to the fifth MTP joint. There is a punctate focus of high attenuation projected over the wound on the oblique view which could represent a tiny foreign body. 2. No bony erosion to suggest osteomyelitis. MRI would be more sensitive. Electronically Signed   By:  Gerome Samavid  Williams III M.D.   On: 06/09/2021 08:23    Pending Labs Unresulted Labs (From admission, onward)     Start     Ordered   06/16/21 0500  Creatinine, serum  (enoxaparin (LOVENOX)    CrCl >/= 30 ml/min)  Weekly,   R     Comments: while on enoxaparin therapy    06/09/21 1004   06/09/21 1027  HIV Antibody (routine testing w rflx)  Once,   R        06/09/21 1027   06/09/21 1009  Hepatic function panel  Add-on,   AD        06/09/21 1008   06/09/21 1001  Hemoglobin A1c  Add-on,   AD        06/09/21 1004   06/09/21 1001  Sedimentation rate  Add-on,   AD        06/09/21 1004   06/09/21 1001  C-reactive protein  Add-on,   AD        06/09/21 1004   06/09/21 1001  Prealbumin  Add-on,   AD        06/09/21 1004   06/09/21 0733  Lactic acid, plasma  Now then every 2 hours,   R (with STAT occurrences)      06/09/21 0732   06/09/21 0733  Blood culture (routine x 2)  BLOOD CULTURE X 2,   R (with STAT occurrences)      06/09/21 0732            Vitals/Pain Today's Vitals   06/09/21 0900 06/09/21 0915 06/09/21 0930 06/09/21 0935  BP: 107/63 103/63 104/63   Pulse: 87 91 91   Resp: 17 15 14    Temp:      TempSrc:      SpO2: 95% 97% 97%   Weight:      Height:      PainSc:    6     Isolation Precautions No active isolations  Medications Medications  ceFEPIme (MAXIPIME) 2 g in sodium chloride 0.9 % 100 mL IVPB (has no administration in time range)  metroNIDAZOLE (FLAGYL) IVPB 500 mg (500 mg Intravenous New Bag/Given 06/09/21 1016)  enoxaparin (LOVENOX) injection 40 mg (has no administration in time range)  acetaminophen (TYLENOL) tablet 1,000 mg (has no administration in time range)    Or  acetaminophen (TYLENOL) suppository 650 mg (has no administration in time range)  ondansetron (ZOFRAN) tablet 4 mg (has no administration in time range)    Or  ondansetron (ZOFRAN) injection 4 mg (has no administration in time range)  lactated ringers bolus 1,000 mL (has no administration in  time range)  insulin aspart (novoLOG) injection 0-20 Units (has no administration in time range)  oxyCODONE (Oxy IR/ROXICODONE) immediate release tablet 10 mg (has no administration in time range)  vancomycin (VANCOCIN) IVPB 1000 mg/200 mL premix (has no administration in time range)  lactated ringers bolus 1,000 mL (0 mLs Intravenous Stopped 06/09/21 0935)  fentaNYL (SUBLIMAZE) injection 50 mcg (50 mcg Intravenous Given 06/09/21 0753)  0.9 %  sodium chloride infusion (0 mLs Intravenous Stopped 06/09/21 1017)    Mobility walks Moderate fall risk   Focused Assessments Cardiac Assessment Handoff:    Lab Results  Component Value Date   TROPONINI <0.03 06/27/2018   Lab Results  Component Value Date   DDIMER 1.80 (H) 11/04/2018   Does the Patient currently have chest pain? No    R Recommendations: See Admitting Provider Note  Report given to:   Additional Notes:

## 2021-06-09 NOTE — ED Provider Notes (Signed)
[facility] DEPT Provider Note   CSN: [id number] Arrival date & time: 06/09/21  [id number]     History  Chief Complaint  Patient presents with   Foot Pain    Barry [name] is a 46 y.o. male.  Presented to the emergency room due to concern for left foot wound.  Patient states he has a chronic wound on his left foot but it is gotten significantly more painful and foul-smelling over the past couple weeks.  What was seen in the ER on 5/3 and 5/8.  He was given a prescription for doxycycline.  Patient states that he has completed the course of this antibiotic but it did not offer any improvement in his symptoms.  He has not followed up with his podiatrist recently.  Patient has not noticed any fevers or chills.  Completed chart review, last podiatry office visit was in March.  HPI     Home Medications Prior to Admission medications   Medication Sig Start Date End Date Taking? Authorizing Provider  Accu-Chek Softclix Lancets lancets 3 (three) times daily. 02/01/21   [provider]  allopurinol (ZYLOPRIM) 300 MG tablet 300 mg. 10/22/19   [provider]  aspirin 81 MG chewable tablet 81 mg. 05/03/17   [provider]  atorvastatin (LIPITOR) 80 MG tablet Take 80 mg by mouth daily.  11/21/17   [provider]  B Complex Vitamins (B COMPLEX PO) Take 1 tablet by mouth daily.    [provider]  baclofen (LIORESAL) 10 MG tablet 10 mg. 09/20/20   [provider]  Blood Glucose Monitoring Suppl (GLUCOCOM BLOOD GLUCOSE MONITOR) DEVI 3 (three) times daily. 01/30/21   [provider]  cholecalciferol (VITAMIN D3) 25 MCG (1000 UNIT) tablet Take 2,000 Units by mouth daily.    [provider]  cyanocobalamin 1000 MCG tablet Take by mouth.    [provider]  cyclobenzaprine (FLEXERIL) 10 MG tablet Take by mouth. 08/18/16   [provider]  doxycycline (VIBRAMYCIN) 100 MG capsule Take 1 capsule  (100 mg total) by mouth 2 (two) times daily. One po bid x 7 days 05/22/21   Mesner, Corene Cornea, MD  DULoxetine (CYMBALTA) 30 MG capsule Take 1 capsule by mouth daily. 02/01/21   [provider]  fluticasone (FLONASE) 50 MCG/ACT nasal spray Place into the nose.    [provider]  furosemide (LASIX) 20 MG tablet 20 mg. 01/17/20   [provider]  gentamicin ointment (GARAMYCIN) 0.1 % Apply 1 application. topically daily. For wound care 04/22/21   Landis Martins, DPM  HUMALOG MIX 75/25 (75-25) 100 UNIT/ML SUSP injection Inject 100 Units into the skin 2 (two) times a day. 06/07/18   [provider]  HYDROcodone-acetaminophen (NORCO/VICODIN) 5-325 MG tablet Take 1 tablet by mouth every 6 (six) hours as needed for severe pain. 06/21/19   Evelina Bucy, DPM  JARDIANCE 25 MG TABS tablet Take 25 mg by mouth daily. 10/18/18   [provider]  labetalol (NORMODYNE) 200 MG tablet Take 200 mg by mouth 2 (two) times daily. 01/21/21   [provider]  levothyroxine (SYNTHROID) 75 MCG tablet Take 1 tablet by mouth daily. 11/20/17   [provider]  lisinopril-hydrochlorothiazide (ZESTORETIC) 10-12.5 MG tablet Take 1 tablet by mouth 2 (two) times daily. 04/13/19   [provider]  meloxicam (MOBIC) 7.5 MG tablet Take 1 tablet daily as needed for gout flare. 05/27/21   Molpus, John, MD  metFORMIN (GLUCOPHAGE) 1000 MG tablet  Take 1,000 mg by mouth 2 (two) times daily. 01/30/21   [provider]  metFORMIN (GLUCOPHAGE) 500 MG tablet Take 1,000 mg by mouth 2 (two) times daily with a meal. 05/14/18   [provider]  methocarbamol (ROBAXIN) 500 MG tablet Take 500 mg by mouth 3 (three) times daily. 01/26/21   [provider]  miconazole (MICOTIN) 2 % cream Apply 1 application topically 2 (two) times daily as needed (yeast infection due to antibiotic).    [provider]  Multiple Vitamin (MULTIVITAMIN WITH MINERALS) TABS tablet Take 1  tablet by mouth daily.    [provider]  Multiple Vitamins-Minerals (PRESERVISION AREDS PO) Take 1 tablet by mouth daily.    [provider]  oxyCODONE-acetaminophen (PERCOCET) 10-325 MG tablet Take 1 tablet by mouth every 6 (six) hours as needed for pain. 05/27/21   Molpus, John, MD  pregabalin (LYRICA) 75 MG capsule Take 1 capsule (75 mg total) by mouth 2 (two) times daily. 08/23/19   Stover, Titorya, DPM  RELION INSULIN SYRINGE 1ML/31G 31G X 5/16" 1 ML MISC Inject 1 Syringe into the skin 2 (two) times daily. 01/26/21   [provider]  silver sulfADIAZINE (SILVADENE) 1 % cream Apply pea-sized amount to wound daily. 02/14/21   Evelina Bucy, DPM      Allergies    Patient has no known allergies.    Review of Systems   Review of Systems  Constitutional:  Negative for chills and fever.  HENT:  Negative for ear pain and sore throat.   Eyes:  Negative for pain and visual disturbance.  Respiratory:  Negative for cough and shortness of breath.   Cardiovascular:  Negative for chest pain and palpitations.  Gastrointestinal:  Negative for abdominal pain and vomiting.  Genitourinary:  Negative for dysuria and hematuria.  Musculoskeletal:  Positive for arthralgias. Negative for back pain.  Skin:  Negative for color change and rash.  Neurological:  Negative for seizures and syncope.  All other systems reviewed and are negative.  Physical Exam Updated Vital Signs BP 103/63   Pulse 91   Temp 97.6 F (36.4 C) (Oral)   Resp 15   Ht 6\' 3"  (1.905 m)   Wt (!) 154.2 kg   SpO2 97%   BMI 42.50 kg/m  Physical Exam Vitals and nursing note reviewed.  Constitutional:      General: He is not in acute distress.    Appearance: He is well-developed.  HENT:     Head: Normocephalic and atraumatic.  Eyes:     Conjunctiva/sclera: Conjunctivae normal.  Cardiovascular:     Rate and Rhythm: Normal rate and regular rhythm.     Heart sounds: No murmur heard. Pulmonary:      Effort: Pulmonary effort is normal. No respiratory distress.     Breath sounds: Normal breath sounds.  Abdominal:     Palpations: Abdomen is soft.     Tenderness: There is no abdominal tenderness.  Musculoskeletal:     Cervical back: Neck supple.     Comments: There is approximately 2 cm diameter diabetic foot ulcer with dark center, just proximal to the base of the fifth toe, no significant overlying erythema, no active drainage noted  Skin:    General: Skin is warm and dry.     Capillary Refill: Capillary refill takes less than 2 seconds.     Findings: No lesion.     Comments: Wound as above  Neurological:     Mental Status: He is  alert.  Psychiatric:        Mood and Affect: Mood normal.    ED Results / Procedures / Treatments   Labs (all labs ordered are listed, but only abnormal results are displayed) Labs Reviewed  CBC WITH DIFFERENTIAL/PLATELET - Abnormal; Notable for the following components:      Result Value   WBC 11.1 (*)    HCT 38.7 (*)    All other components within normal limits  BASIC METABOLIC PANEL - Abnormal; Notable for the following components:   Sodium 134 (*)    Glucose, Bld 171 (*)    BUN 35 (*)    Creatinine, Ser 2.46 (*)    Calcium 8.3 (*)    GFR, Estimated 33 (*)    All other components within normal limits  CULTURE, BLOOD (ROUTINE X 2)  CULTURE, BLOOD (ROUTINE X 2)  LACTIC ACID, PLASMA  LACTIC ACID, PLASMA    EKG None  Radiology DG Foot Complete Left  Result Date: 06/09/2021 CLINICAL DATA:  Left foot pain. Concern for osteomyelitis. Chronic wound to the distal aspect of the plantar surface of the left foot next to the fifth toe. Discharge and pain. EXAM: LEFT FOOT - COMPLETE 3+ VIEW COMPARISON:  None Available. FINDINGS: Degenerative changes at the first MTP joint. No fractures. The reported chronic wound is identified near the fifth MTP joint. A punctate focus of high attenuation is identified within the wound on the oblique view. There is no  bony erosion underlying the wound. Degenerative changes in the midfoot. IMPRESSION: 1. The patient's reported chronic wound is identified adjacent to the fifth MTP joint. There is a punctate focus of high attenuation projected over the wound on the oblique view which could represent a tiny foreign body. 2. No bony erosion to suggest osteomyelitis. MRI would be more sensitive. Electronically Signed   By: Dorise Bullion III M.D.   On: 06/09/2021 08:23    Procedures Procedures    Medications Ordered in ED Medications  lactated ringers bolus 1,000 mL (0 mLs Intravenous Stopped 06/09/21 0935)  fentaNYL (SUBLIMAZE) injection 50 mcg (50 mcg Intravenous Given 06/09/21 0753)  0.9 %  sodium chloride infusion ( Intravenous New Bag/Given 06/09/21 0930)    ED Course/ Medical Decision Making/ A&P                           Medical Decision Making Amount and/or Complexity of Data Reviewed Labs: ordered. Radiology: ordered.  Risk Prescription drug management. Decision regarding hospitalization.   46 year old man presents to ER for concern of worsening of his left foot wound.  Patient noted to be tachycardic initially, afebrile.  Check labs, provided IV fluids, check x-ray.  I reviewed past podiatry notes and the photo from chart on 5/8.  The wound looks markedly worse today.  Lab work was reviewed, noted AKI.  I independently reviewed and interpreted x-ray, no cortical changes to suggest osteomyelitis, radiologist recommending MRI for better sensitivity.  With a raise concern about possible small foreign body.  I discussed the case with podiatry on-call [place].  She will evaluate patient, I discussed that patient likely would benefit from admission for AKI regardless of need for the foot admission.  She says that she would recommend starting IV antibiotics, MRI of left foot without contrast and their team will consult on patient.  She requests Vanco and Zosyn.  Will consult hospitalist for admit.  I have  ordered the bank and Zosyn and ordered  some continuous IV fluids for the AKI.  Patient agreeable to admission.        Final Clinical Impression(s) / ED Diagnoses Final diagnoses:  Diabetic foot infection ([place])  AKI (acute kidney injury) [facility])    Rx / [place] Orders ED Discharge Orders     None         Lucrezia Starch, MD 06/09/21 [phone]

## 2021-06-09 NOTE — Progress Notes (Signed)
Left lower extremity venous duplex has been completed. Preliminary results can be found in CV Proc through chart review.   06/09/21 2:11 PM Olen Cordial RVT

## 2021-06-09 NOTE — Progress Notes (Signed)
ABI's have been completed. Preliminary results can be found in CV Proc through chart review.   06/09/21 11:13 AM Olen Cordial RVT

## 2021-06-09 NOTE — H&P (Signed)
History and Physical    Patient: Barry [name] JGO:[id number] DOB: 1975/10/16 DOA: 06/09/2021 DOS: the patient was seen and examined on 06/09/2021 PCP: Maryella Shivers, MD  Patient coming from: Home  Chief Complaint:  Chief Complaint  Patient presents with   Foot Pain   HPI: Barry [name] is a 46 y.o. male with medical history significant of class III obesity with a current BMI of 42.5 kg/m, type II DM, gout, hyperlipidemia, hypothyroidism, diabetic peripheral neuropathy, carpal tunnel syndrome, chronic kidney disease who is coming to the emergency department due to infection of his left foot chronic wound with pain, purulent/foul-smelling discharge, generalized weakness and chills.  No fever or night sweats.  He was seen recently in the emergency department and was given doxycycline.  The patient stated that he completed that course.  He has not followed with his podiatrist Dr. Cannon Kettle yet.  He denied dyspnea, chest pain, palpitations, diaphoresis or pitting edema lower extremities.  No abdominal pain, nausea, emesis, diarrhea, constipation, melena hematochezia.  No flank pain, dysuria, frequency or hematuria.  Not sure what are his CBG readings recently.  No polyuria, polydipsia, polyphagia or blurred vision.  ED course: Initial vital signs were temperature 97.6 F, pulse 7019, respiration 20, BP 155/87 mmHg O2 sat 95% on room air.  The patient received vancomycin, 1000 mL of LR bolus and 50 mcg of fentanyl IVP.  Lab work: CBC is her white count 11.1, hemoglobin 13.4 g/dL and platelets 302.  Lactic acid was normal.  BMP with normal electrolytes after calcium and sodium corrected.  Glucose 171, BUN 35 creatinine 2.46 mg/dL.  The patient's baseline creatinine is usually under 2.0 mg/dL.  18 days ago was 2.01 mg/dL.  LFTs with an albumin level 2.2 g/dL, but otherwise unremarkable.  Imaging: Left foot x-ray with chronic wound adjacent to the fifth MTP with questionable tiny foreign body.  No  suggestion of osteomyelitis.  MRI was suggested.  MRI is suspicious for fifth metatarsal head and proximal phalanx acute osteomyelitis.  Please see images and full radiology report for further details.   Review of Systems: As mentioned in the history of present illness. All other systems reviewed and are negative. Past Medical History:  Diagnosis Date   Carpal tunnel syndrome    Diabetes mellitus without complication ([place])    Gout    Hypercholesteremia    Hypothyroidism    Neuropathy    Obesity    Thyroid disease    Past Surgical History:  Procedure Laterality Date   FRACTURE SURGERY     INCISE AND DRAIN ABCESS     IRRIGATION AND DEBRIDEMENT FOOT Right 04/27/2019   Procedure: Debridement and Irrigation with possible placement of antibiotic beads;  Surgeon: Evelina Bucy, DPM;  Location: [facility];  Service: Podiatry;  Laterality: Right;   ORIF TOE FRACTURE Right 04/27/2019   Procedure: Open Reduction vs Excision of Fracture Fragment Right great toe, with pinning.;  Surgeon: Evelina Bucy, DPM;  Location: [facility];  Service: Podiatry;  Laterality: Right;   TONSILLECTOMY     Social History:  reports that he has never smoked. He has never used smokeless tobacco. He reports current alcohol use. He reports that he does not use drugs.  No Known Allergies  No family history on file.  Prior to Admission medications   Medication Sig Start Date End Date Taking? Authorizing Provider  allopurinol (ZYLOPRIM) 300 MG tablet Take 300 mg by mouth daily. 10/22/19  Yes [provider]  HUMALOG MIX 75/25 (75-25)  100 UNIT/ML SUSP injection Inject 100 Units into the skin 2 (two) times a day. 06/07/18  Yes [provider]  JARDIANCE 25 MG TABS tablet Take 25 mg by mouth daily. 10/18/18  Yes [provider]  Accu-Chek Softclix Lancets lancets 1 each 3 (three) times daily. 02/01/21   [provider]  aspirin 81 MG chewable tablet Chew 81 mg by mouth daily. 05/03/17   [provider]  atorvastatin (LIPITOR) 80 MG tablet Take 80 mg by mouth daily.  11/21/17   [provider]  B Complex Vitamins (B COMPLEX PO) Take 1 tablet by mouth daily.    [provider]  baclofen (LIORESAL) 10 MG tablet Take 10 mg by mouth 2 (two) times daily. 09/20/20   [provider]  Blood Glucose Monitoring Suppl (GLUCOCOM BLOOD GLUCOSE MONITOR) DEVI 1 each by Other route 3 (three) times daily. 01/30/21   [provider]  cholecalciferol (VITAMIN D3) 25 MCG (1000 UNIT) tablet Take 2,000 Units by mouth daily.    [provider]  cyanocobalamin 1000 MCG tablet Take 1,000 mcg by mouth daily.    [provider]  cyclobenzaprine (FLEXERIL) 10 MG tablet Take 10 mg by mouth 3 (three) times daily as needed for muscle spasms. 08/18/16   [provider]  doxycycline (VIBRAMYCIN) 100 MG capsule Take 1 capsule (100 mg total) by mouth 2 (two) times daily. One po bid x 7 days 05/22/21   Mesner, Corene Cornea, MD  DULoxetine (CYMBALTA) 30 MG capsule Take 30 mg by mouth daily. 02/01/21   [provider]  fluticasone (FLONASE) 50 MCG/ACT nasal spray Place 1 spray into both nostrils daily.    [provider]  furosemide (LASIX) 20 MG tablet Take 20 mg by mouth daily. 01/17/20   [provider]  gentamicin ointment (GARAMYCIN) 0.1 % Apply 1 application. topically daily. For wound care 04/22/21   Landis Martins, DPM  HYDROcodone-acetaminophen (NORCO/VICODIN) 5-325 MG tablet Take 1 tablet by mouth every 6 (six) hours as needed for severe pain. 06/21/19   Evelina Bucy, DPM  labetalol (NORMODYNE) 200 MG tablet Take 200 mg by mouth 2 (two) times daily. 01/21/21   [provider]  levothyroxine (SYNTHROID) 75 MCG tablet Take 75 mcg by mouth daily. 11/20/17   [provider]  lisinopril-hydrochlorothiazide (ZESTORETIC) 10-12.5 MG tablet Take 1 tablet by mouth 2 (two) times daily. 04/13/19   [provider]  meloxicam  (MOBIC) 7.5 MG tablet Take 1 tablet daily as needed for gout flare. Patient taking differently: Take 7.5 mg by mouth See admin instructions. Take 1 tablet daily as needed for gout flare. 05/27/21   Molpus, John, MD  metFORMIN (GLUCOPHAGE) 1000 MG tablet Take 1,000 mg by mouth 2 (two) times daily. 01/30/21   [provider]  metFORMIN (GLUCOPHAGE) 500 MG tablet Take 1,000 mg by mouth 2 (two) times daily with a meal. 05/14/18   [provider]  methocarbamol (ROBAXIN) 500 MG tablet Take 500 mg by mouth 3 (three) times daily. 01/26/21   [provider]  miconazole (MICOTIN) 2 % cream Apply 1 application topically 2 (two) times daily as needed (yeast infection due to antibiotic).    [provider]  Multiple Vitamin (MULTIVITAMIN WITH MINERALS) TABS tablet Take 1 tablet by mouth daily.    [provider]  Multiple Vitamins-Minerals (PRESERVISION AREDS PO) Take 1 tablet by mouth daily.    [provider]  oxyCODONE-acetaminophen (PERCOCET) 10-325 MG tablet Take 1 tablet by mouth  every 6 (six) hours as needed for pain. 05/27/21   Molpus, John, MD  pregabalin (LYRICA) 75 MG capsule Take 1 capsule (75 mg total) by mouth 2 (two) times daily. 08/23/19   Stover, Titorya, DPM  RELION INSULIN SYRINGE 1ML/31G 31G X 5/16" 1 ML MISC Inject 1 Syringe into the skin 2 (two) times daily. 01/26/21   [provider]  silver sulfADIAZINE (SILVADENE) 1 % cream Apply pea-sized amount to wound daily. 02/14/21   Evelina Bucy, DPM    Physical Exam: Vitals:   06/09/21 0900 06/09/21 0915 06/09/21 0930 06/09/21 1000  BP: 107/63 103/63 104/63 101/68  Pulse: 87 91 91 88  Resp: 17 15 14 15   Temp:      TempSrc:      SpO2: 95% 97% 97% 98%  Weight:      Height:       Physical Exam Vitals and nursing note reviewed.  Constitutional:      General: He is awake.     Appearance: He is obese.  HENT:     Head: Normocephalic.     Mouth/Throat:     Mouth: Mucous membranes are  dry.  Eyes:     General: No scleral icterus.    Pupils: Pupils are equal, round, and reactive to light.  Neck:     Vascular: No JVD.  Cardiovascular:     Rate and Rhythm: Normal rate and regular rhythm.     Heart sounds: S1 normal and S2 normal.  Abdominal:     General: Bowel sounds are normal. There is no distension.     Palpations: Abdomen is soft.     Tenderness: There is no abdominal tenderness. There is no guarding.  Musculoskeletal:     Cervical back: Neck supple.     Right lower leg: No edema.     Left lower leg: Pitting Edema present.     Right foot: Bunion present.     Left foot: Swelling and tenderness present.  Feet:     Left foot:     Skin integrity: Ulcer and erythema present.     Comments: See pictures below. Neurological:     Mental Status: He is alert and oriented to person, place, and time.  Psychiatric:        Mood and Affect: Mood normal.        Behavior: Behavior normal. Behavior is cooperative.        Data Reviewed:  There are no new results to review at this time.  Assessment and Plan: Principal Problem:   Diabetic ulcer of left foot associated  with type 2 diabetes mellitus ([place]) Admit to telemetry/inpatient. Check CRP, ESR and prealbumin. Analgesics as needed. Continue vancomycin per pharmacy. Cefepime 2 g IVPB every 8 hours. Metronidazole 500 mg IVPB every 12 hours. MRI of foot suspicious for osteomyelitis. Podiatry consult appreciated.  Active Problems:   Acute kidney injury superimposed  on chronic kidney disease 3a (HCC) Continue IV fluids. Hold ARB/ACE. Hold diuretic. Avoid hypotension. Avoid nephrotoxins. Monitor intake and output. Monitor renal function electrolytes.    Type 2 diabetes mellitus with hyperglycemia (HCC) Carbohydrate modified diet. Check hemoglobin A1c. Continue Jardiance 25 mg p.o. daily. CBG monitoring before meals and bedtime.    Hypothyroidism Continue levothyroxine 75 mcg p.o. daily.    Diabetic  neuropathy ([place]) Not taking pregabalin. Analgesics as needed.    Dyslipidemia Continue atorvastatin 80 mg p.o. daily.    Hypertension Holding diuretic and lisinopril. Hydralazine as needed if uncontrolled.  OSA (obstructive sleep apnea) Not on CPAP.    Class 3 obesity (HCC) Lifestyle modifications. Follow closely with PCP/endocrinology.    Advance Care Planning:   Code Status: Full Code   Consults: Podiatry (Dr. Lorenda Peck).  Family Communication:   Severity of Illness: The appropriate patient status for this patient is INPATIENT. Inpatient status is judged to be reasonable and necessary in order to provide the required intensity of service to ensure the patient's safety. The patient's presenting symptoms, physical exam findings, and initial radiographic and laboratory data in the context of their chronic comorbidities is felt to place them at high risk for further clinical deterioration. Furthermore, it is not anticipated that the patient will be medically stable for discharge from the hospital within 2 midnights of admission.   * I certify that at the point of admission it is my clinical judgment that the patient will require inpatient hospital care spanning beyond 2 midnights from the point of admission due to high intensity of service, high risk for further deterioration and high frequency of surveillance required.*  Author: Reubin Milan, MD 06/09/2021 11:38 AM  For on call review [email or web address].   This document was prepared using Dragon voice recognition software and may contain some unintended transcription errors.

## 2021-06-09 NOTE — Consult Note (Signed)
WOC Nurse Consult Note: WOC Nursing is simultaneously consulted with Podiatric Medicine for left lateral chronic, nonhealing foot wound with infection and failure to respond to oral antibiotics.  Dr. Vinnie Level has already seen the patient today. Wound debridement was performed and that service will follow closely. A dry gauze dressing with compression was applied.  There is no role for WOC Nursing at this time as we defer to the Podiatry provider's expertise.  WOC nursing team will not follow, but will remain available to this patient, the nursing and medical teams.  Please re-consult if needed. Thanks, Ladona Mow, MSN, RN, GNP, [place]  Pager# [phone]

## 2021-06-10 ENCOUNTER — Inpatient Hospital Stay (HOSPITAL_COMMUNITY): Payer: BC Managed Care – PPO | Admitting: Anesthesiology

## 2021-06-10 ENCOUNTER — Encounter (HOSPITAL_COMMUNITY)
Admission: EM | Disposition: A | Discharge status: Home / Self Care | Payer: Self-pay | Source: Home / Self Care | Attending: Internal Medicine

## 2021-06-10 ENCOUNTER — Other Ambulatory Visit: Payer: Self-pay

## 2021-06-10 ENCOUNTER — Encounter (HOSPITAL_COMMUNITY): Payer: Self-pay | Admitting: *Deleted

## 2021-06-10 ENCOUNTER — Inpatient Hospital Stay (HOSPITAL_COMMUNITY): Payer: BC Managed Care – PPO

## 2021-06-10 DIAGNOSIS — Z794 Long term (current) use of insulin: Secondary | ICD-10-CM

## 2021-06-10 DIAGNOSIS — I10 Essential (primary) hypertension: Secondary | ICD-10-CM

## 2021-06-10 DIAGNOSIS — N189 Chronic kidney disease, unspecified: Secondary | ICD-10-CM

## 2021-06-10 DIAGNOSIS — E11621 Type 2 diabetes mellitus with foot ulcer: Secondary | ICD-10-CM | POA: Diagnosis not present

## 2021-06-10 DIAGNOSIS — M86172 Other acute osteomyelitis, left ankle and foot: Secondary | ICD-10-CM

## 2021-06-10 DIAGNOSIS — E1165 Type 2 diabetes mellitus with hyperglycemia: Secondary | ICD-10-CM

## 2021-06-10 DIAGNOSIS — M86672 Other chronic osteomyelitis, left ankle and foot: Secondary | ICD-10-CM

## 2021-06-10 DIAGNOSIS — D72829 Elevated white blood cell count, unspecified: Secondary | ICD-10-CM

## 2021-06-10 DIAGNOSIS — N179 Acute kidney failure, unspecified: Secondary | ICD-10-CM

## 2021-06-10 DIAGNOSIS — G4733 Obstructive sleep apnea (adult) (pediatric): Secondary | ICD-10-CM

## 2021-06-10 DIAGNOSIS — M869 Osteomyelitis, unspecified: Secondary | ICD-10-CM

## 2021-06-10 HISTORY — PX: AMPUTATION: SHX166

## 2021-06-10 LAB — GLUCOSE, CAPILLARY
Glucose-Capillary: 209 mg/dL — ABNORMAL HIGH (ref 70–99)
Glucose-Capillary: 192 mg/dL — ABNORMAL HIGH (ref 70–99)
Glucose-Capillary: 181 mg/dL — ABNORMAL HIGH (ref 70–99)
Glucose-Capillary: 157 mg/dL — ABNORMAL HIGH (ref 70–99)
Glucose-Capillary: 158 mg/dL — ABNORMAL HIGH (ref 70–99)
Glucose-Capillary: 223 mg/dL — ABNORMAL HIGH (ref 70–99)

## 2021-06-10 LAB — CREATININE, SERUM
Creatinine, Ser: 2.14 mg/dL — ABNORMAL HIGH (ref 0.61–1.24)
GFR, Estimated: 38 mL/min — ABNORMAL LOW (ref >60)

## 2021-06-10 IMAGING — DX DG FOOT COMPLETE 3+V*L*
3 series · 3 of 3 positions shown · non-contrast
Comparison: [DATE]

CLINICAL DATA: Post amputation of the fifth ray left foot.

EXAM:
LEFT FOOT - COMPLETE 3+ VIEW

[foot ap]
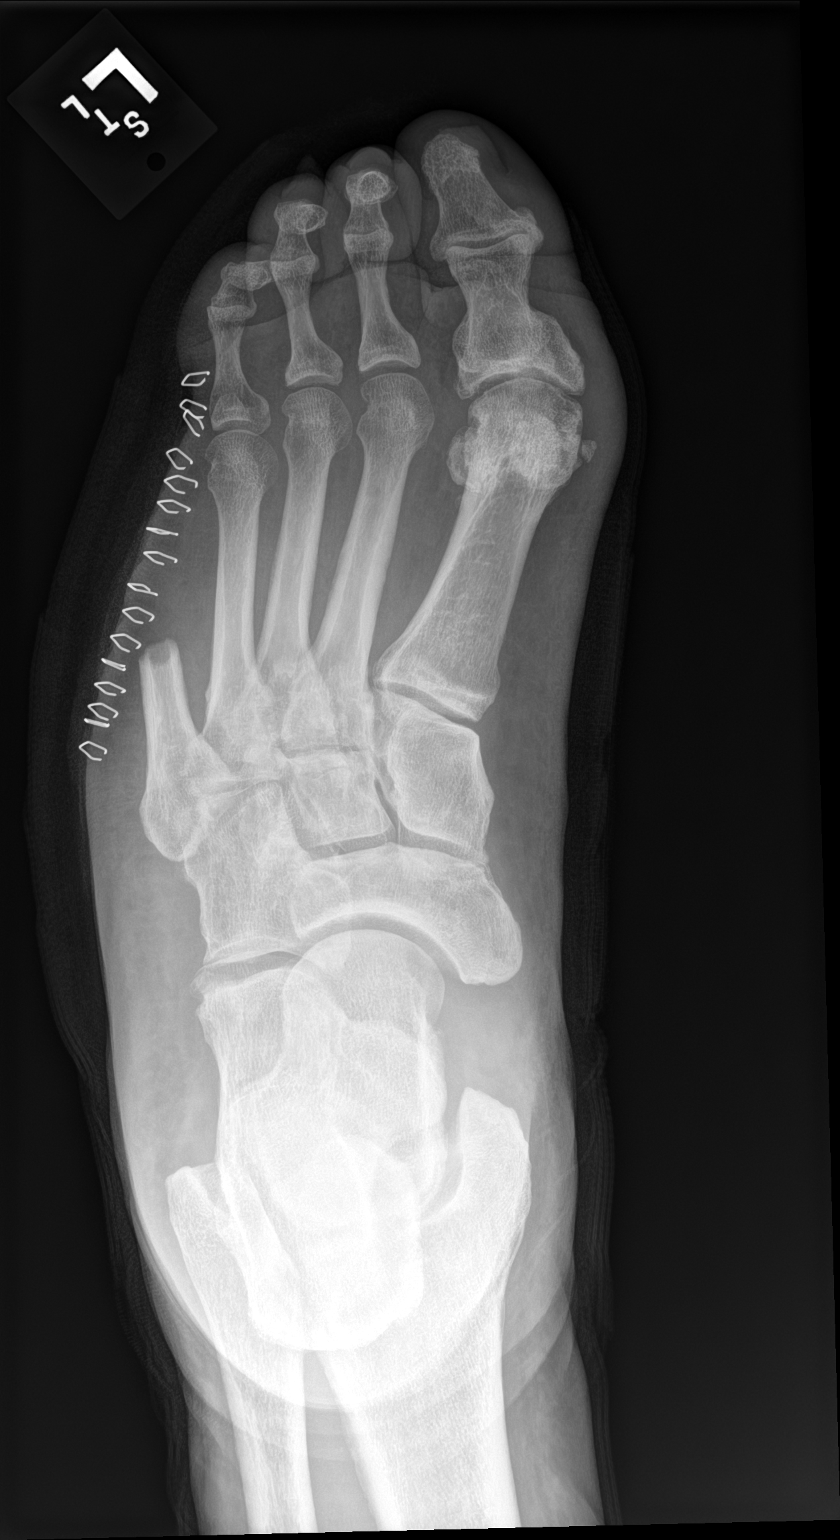

[foot obl]
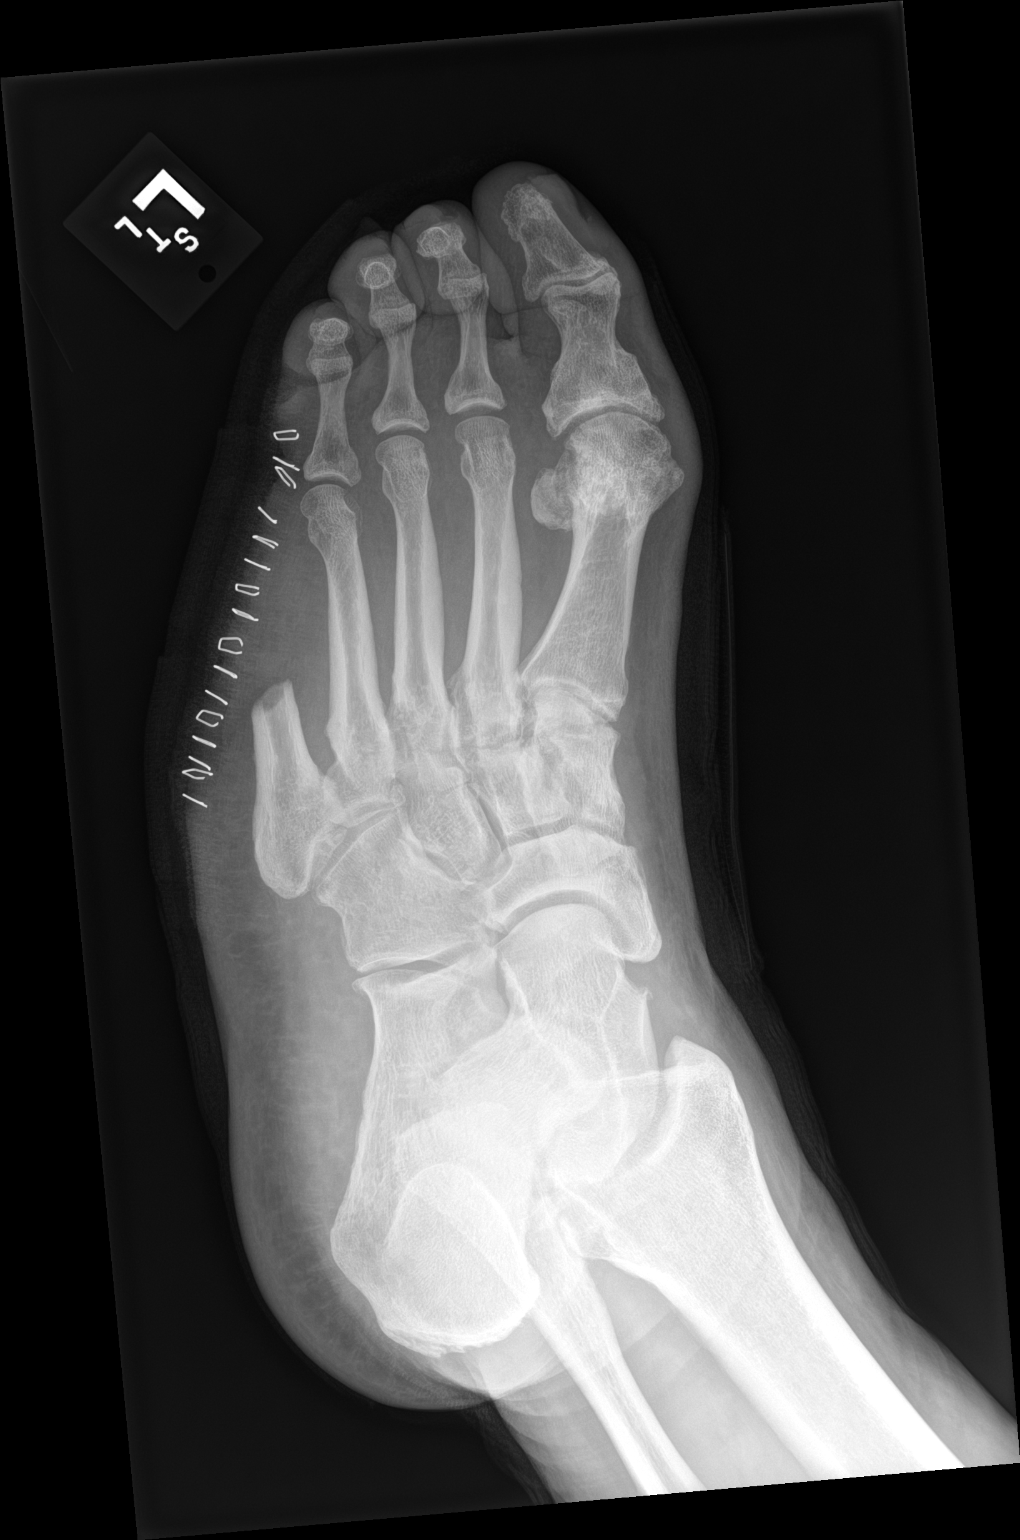

[foot lat]
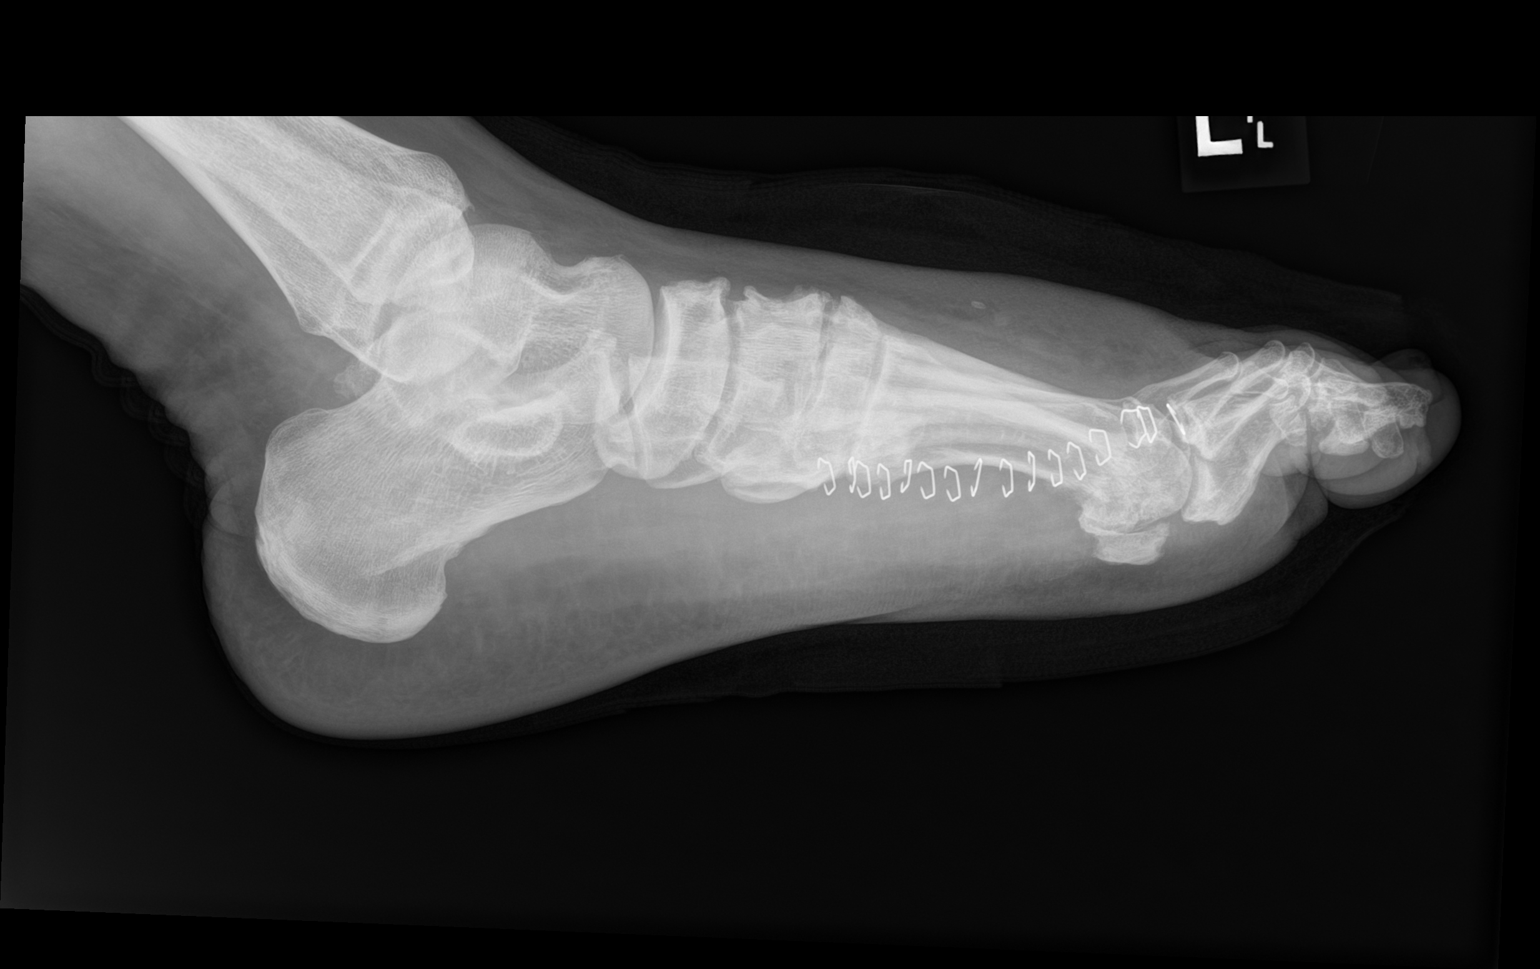

[3 of 3 positions shown; findings below may reference images not displayed]

FINDINGS: Interval postoperative changes with resection of the left fifth ray
at the proximal metatarsal shaft level. Skin clips are present
consistent with recent surgery. A margin of resection appears
intact. No radiopaque foreign bodies or gas collections are
identified in the soft tissues. Prominent degenerative changes are
present in the first metatarsal-phalangeal joint. Degenerative
changes also present in the interphalangeal and intertarsal joints.
Mild dorsal soft tissue swelling.
IMPRESSION: Postoperative changes with resection of the left fifth ray at the
proximal metatarsal shaft level. Degenerative changes in the left
foot.

## 2021-06-10 SURGERY — AMPUTATION, FOOT, RAY
Anesthesia: Monitor Anesthesia Care | Laterality: Left

## 2021-06-10 MED ORDER — PROPOFOL 10 MG/ML IV BOLUS
INTRAVENOUS | Status: DC | PRN
  Administered 2021-06-10 (×2): 20 mg via INTRAVENOUS

## 2021-06-10 MED ORDER — FENTANYL CITRATE PF 50 MCG/ML IJ SOSY
25.0000 ug | PREFILLED_SYRINGE | INTRAMUSCULAR | Status: DC | PRN
  Administered 2021-06-10: 50 ug via INTRAVENOUS
  Administered 2021-06-10: 25 ug via INTRAVENOUS

## 2021-06-10 MED ORDER — ONDANSETRON HCL 4 MG/2ML IJ SOLN
INTRAMUSCULAR | Status: DC | PRN
  Administered 2021-06-10: 4 mg via INTRAVENOUS

## 2021-06-10 MED ORDER — PHENYLEPHRINE 80 MCG/ML (10ML) SYRINGE FOR IV PUSH (FOR BLOOD PRESSURE SUPPORT)
PREFILLED_SYRINGE | INTRAVENOUS | Status: DC | PRN
  Administered 2021-06-10: 80 ug via INTRAVENOUS

## 2021-06-10 MED ORDER — BUPIVACAINE HCL (PF) 0.5 % IJ SOLN
INTRAMUSCULAR | Status: AC
  Filled 2021-06-10: qty 30

## 2021-06-10 MED ORDER — BUPIVACAINE HCL (PF) 0.5 % IJ SOLN
INTRAMUSCULAR | Status: DC | PRN
Start: 2021-06-10 — End: 2021-06-10
  Administered 2021-06-10: 9.5 mL

## 2021-06-10 MED ORDER — LACTATED RINGERS IV SOLN: INTRAVENOUS | Status: DC

## 2021-06-10 MED ORDER — 0.9 % SODIUM CHLORIDE (POUR BTL) OPTIME
TOPICAL | Status: DC | PRN
  Administered 2021-06-10: 1000 mL

## 2021-06-10 MED ORDER — FENTANYL CITRATE (PF) 100 MCG/2ML IJ SOLN
INTRAMUSCULAR | Status: DC | PRN
Start: 2021-06-10 — End: 2021-06-10
  Administered 2021-06-10 (×2): 25 ug via INTRAVENOUS

## 2021-06-10 MED ORDER — OXYCODONE-ACETAMINOPHEN 5-325 MG PO TABS: 1.0000 | ORAL_TABLET | ORAL | Status: DC | PRN

## 2021-06-10 MED ORDER — AMISULPRIDE (ANTIEMETIC) 5 MG/2ML IV SOLN: 10.0000 mg | Freq: Once | INTRAVENOUS | Status: DC | PRN

## 2021-06-10 MED ORDER — OXYCODONE HCL 5 MG PO TABS: 5.0000 mg | ORAL_TABLET | Freq: Once | ORAL | Status: DC | PRN

## 2021-06-10 MED ORDER — MIDAZOLAM HCL 2 MG/2ML IJ SOLN
INTRAMUSCULAR | Status: AC
  Filled 2021-06-10: qty 2

## 2021-06-10 MED ORDER — ACETAMINOPHEN 10 MG/ML IV SOLN: 1000.0000 mg | Freq: Once | INTRAVENOUS | Status: DC | PRN

## 2021-06-10 MED ORDER — ACETAMINOPHEN 160 MG/5ML PO SOLN: 325.0000 mg | ORAL | Status: DC | PRN

## 2021-06-10 MED ORDER — FENTANYL CITRATE (PF) 100 MCG/2ML IJ SOLN
INTRAMUSCULAR | Status: AC
Start: 2021-06-10 — End: ?
  Filled 2021-06-10: qty 2

## 2021-06-10 MED ORDER — MIDAZOLAM HCL 2 MG/2ML IJ SOLN
INTRAMUSCULAR | Status: DC | PRN
  Administered 2021-06-10: 2 mg via INTRAVENOUS

## 2021-06-10 MED ORDER — OXYCODONE HCL 5 MG/5ML PO SOLN: 5.0000 mg | Freq: Once | ORAL | Status: DC | PRN

## 2021-06-10 MED ORDER — ACETAMINOPHEN 325 MG PO TABS: 325.0000 mg | ORAL_TABLET | ORAL | Status: DC | PRN

## 2021-06-10 MED ORDER — FENTANYL CITRATE PF 50 MCG/ML IJ SOSY
PREFILLED_SYRINGE | INTRAMUSCULAR | Status: AC
  Administered 2021-06-10: 25 ug via INTRAVENOUS
  Filled 2021-06-10: qty 2

## 2021-06-10 MED ORDER — LIDOCAINE HCL 2 % IJ SOLN
INTRAMUSCULAR | Status: AC
  Filled 2021-06-10: qty 20

## 2021-06-10 MED ORDER — CHLORHEXIDINE GLUCONATE 0.12 % MT SOLN
15.0000 mL | Freq: Once | OROMUCOSAL | Status: AC
  Administered 2021-06-10: 15 mL via OROMUCOSAL

## 2021-06-10 MED ORDER — PROPOFOL 500 MG/50ML IV EMUL
INTRAVENOUS | Status: DC | PRN
  Administered 2021-06-10: 80 ug/kg/min via INTRAVENOUS

## 2021-06-10 MED ORDER — LIDOCAINE HCL (PF) 2 % IJ SOLN
INTRAMUSCULAR | Status: DC | PRN
  Administered 2021-06-10: 9.5 mL via INTRADERMAL

## 2021-06-10 MED ORDER — LIDOCAINE 2% (20 MG/ML) 5 ML SYRINGE
INTRAMUSCULAR | Status: DC | PRN
  Administered 2021-06-10: 40 mg via INTRAVENOUS

## 2021-06-10 SURGICAL SUPPLY — 30 items
APL PRP STRL LF DISP 70% ISPRP (MISCELLANEOUS) ×1
BAG SPEC THK2 15X12 ZIP CLS (MISCELLANEOUS) ×1
BAG ZIPLOCK 12X15 (MISCELLANEOUS) ×3 IMPLANT
BANDAGE ESMARK 6X9 LF (GAUZE/BANDAGES/DRESSINGS) ×2 IMPLANT
BNDG CMPR 9X6 STRL LF SNTH (GAUZE/BANDAGES/DRESSINGS) ×1
BNDG COHESIVE 4X5 TAN ST LF (GAUZE/BANDAGES/DRESSINGS) ×3 IMPLANT
BNDG CONFORM 2 STRL LF (GAUZE/BANDAGES/DRESSINGS) ×2 IMPLANT
BNDG ELASTIC 4X5.8 VLCR STR LF (GAUZE/BANDAGES/DRESSINGS) ×1 IMPLANT
BNDG ESMARK 6X9 LF (GAUZE/BANDAGES/DRESSINGS) ×2
CHLORAPREP W/TINT 26 (MISCELLANEOUS) ×3 IMPLANT
COVER SURGICAL LIGHT HANDLE (MISCELLANEOUS) ×3 IMPLANT
CUFF TOURN SGL QUICK 18X4 (TOURNIQUET CUFF) ×2 IMPLANT
DRSG EMULSION OIL 3X3 NADH (GAUZE/BANDAGES/DRESSINGS) ×2 IMPLANT
DRSG PAD ABDOMINAL 8X10 ST (GAUZE/BANDAGES/DRESSINGS) ×1 IMPLANT
GAUZE SPONGE 4X4 12PLY STRL (GAUZE/BANDAGES/DRESSINGS) ×3 IMPLANT
GAUZE XEROFORM 1X8 LF (GAUZE/BANDAGES/DRESSINGS) ×2 IMPLANT
GLOVE BIOGEL PI IND STRL 8 (GLOVE) ×2 IMPLANT
GLOVE BIOGEL PI INDICATOR 8 (GLOVE) ×1
GLOVE ECLIPSE 8.0 STRL XLNG CF (GLOVE) ×3 IMPLANT
GOWN STRL REUS W/ TWL LRG LVL3 (GOWN DISPOSABLE) ×2 IMPLANT
GOWN STRL REUS W/TWL LRG LVL3 (GOWN DISPOSABLE) ×2
KIT BASIN OR (CUSTOM PROCEDURE TRAY) ×3 IMPLANT
NDL HYPO 25X1 1.5 SAFETY (NEEDLE) ×2 IMPLANT
NEEDLE HYPO 25X1 1.5 SAFETY (NEEDLE) ×2 IMPLANT
NS IRRIG 1000ML POUR BTL (IV SOLUTION) ×3 IMPLANT
PACK ORTHO EXTREMITY (CUSTOM PROCEDURE TRAY) ×3 IMPLANT
PENCIL SMOKE EVACUATOR (MISCELLANEOUS) IMPLANT
PROTECTOR NERVE ULNAR (MISCELLANEOUS) ×3 IMPLANT
SUT ETHILON 4 0 PS 2 18 (SUTURE) ×6 IMPLANT
TOWEL OR 17X26 10 PK STRL BLUE (TOWEL DISPOSABLE) ×3 IMPLANT

## 2021-06-10 NOTE — Progress Notes (Signed)
   PODIATRY PROGRESS NOTE  NAME Barry [name] MRN [id number] DOB 02/21/1975 DOA 06/09/2021   Chief complaint: Worsening ulcer positive for osteomyelitis left foot Chief Complaint  Patient presents with   Foot Pain     Past Medical History:  Diagnosis Date   Carpal tunnel syndrome    Diabetes mellitus without complication (HCC)    Gout    Hypercholesteremia    Hypothyroidism    Neuropathy    Obesity    Thyroid disease        Latest Ref Rng & Units 06/09/2021    7:47 AM 05/22/2021    4:16 AM 04/27/2019    8:11 AM  CBC  WBC 4.0 - 10.5 K/uL 11.1   8.1     Hemoglobin 13.0 - 17.0 g/dL 13.4   13.6   14.3    Hematocrit 39.0 - 52.0 % 38.7   39.6   42.0    Platelets 150 - 400 K/uL 302   319          Latest Ref Rng & Units 06/10/2021    4:44 AM 06/09/2021    7:47 AM 05/22/2021    4:16 AM  BMP  Glucose 70 - 99 mg/dL  171   263    BUN 6 - 20 mg/dL  35   29    Creatinine 0.61 - 1.24 mg/dL 2.14   2.46   2.01    Sodium 135 - 145 mmol/L  134   135    Potassium 3.5 - 5.1 mmol/L  3.9   3.6    Chloride 98 - 111 mmol/L  103   103    CO2 22 - 32 mmol/L  23   24    Calcium 8.9 - 10.3 mg/dL  8.3   8.7      ASSESSMENT/PLAN OF CARE Osteomyelitis left foot -Spoke with patient today regarding the findings on MRI consistent with osteomyelitis and septic arthritis of the fifth MTP left foot.  Patient states that he is ready for surgery.  Patient currently NPO.  Planning for surgery this evening after 5 PM.  All patient questions answered. -Preoperative orders placed.  Surgery will consist of partial fifth ray amputation left foot -Podiatry will continue to follow   [name] M. Lamont Glasscock, DPM [facility]  Dr. Lashaya Kienitz M. Jarett Dralle, DPM    [address]                                        [place], [place] [address]                Office [phone]  Fax [phone]     

## 2021-06-10 NOTE — Anesthesia Postprocedure Evaluation (Signed)
Anesthesia Post Note  Patient: Barry [name]  Procedure(s) Performed: 5th RAY AMPUTATION LEFT FOOT (Left)     Patient location during evaluation: PACU Anesthesia Type: MAC Level of consciousness: awake and alert Pain management: pain level controlled Vital Signs Assessment: post-procedure vital signs reviewed and stable Respiratory status: spontaneous breathing, nonlabored ventilation, respiratory function stable and patient connected to nasal cannula oxygen Cardiovascular status: stable and blood pressure returned to baseline Postop Assessment: no apparent nausea or vomiting Anesthetic complications: no   No notable events documented.  Last Vitals:  Vitals:   06/10/21 1930 06/10/21 1958  BP: 140/85 137/90  Pulse: 88 85  Resp: 14 18  Temp: 36.5 C (!) 36.4 C  SpO2: 96% 98%    Last Pain:  Vitals:   06/10/21 1958  TempSrc: Oral  PainSc:                  Effie Berkshire

## 2021-06-10 NOTE — Progress Notes (Signed)
PROGRESS NOTE    Barry [name]  [id number] DOB: 01-03-1976 DOA: 06/09/2021 PCP: Maryella Shivers, MD   Brief Narrative:  46 y.o. male with medical history significant of class III obesity,  type II DM, gout, hyperlipidemia, hypothyroidism, diabetic peripheral neuropathy, carpal tunnel syndrome, chronic kidney disease, chronic left foot wound presented with worsening left foot pain, foul-smelling discharge with weakness and chills despite being on doxycycline as an outpatient recently.  On presentation, creatinine was 2.46.  Left foot x-ray with chronic wound adjacent to the fifth MTP with questionable tiny foreign body.  No suggestion of osteomyelitis.  MRI was suggested.  MRI was suspicious for fifth metatarsal head and proximal phalanx acute osteomyelitis.  He was started on broad-spectrum antibiotics.  Podiatry was consulted.  Assessment & Plan:   Possible acute left foot osteomyelitis/diabetic ulcer infection -MRI of the foot as above.  Currently on broad-spectrum antibiotics.  Dietary following.  We will make him n.p.o. in case podiatry decides to do surgical intervention today.  AKI on CKD stage IIIa -Presented with creatinine of 2.46; creatinine 2.01 few weeks ago.  Improving to 2.14 today.  Currently on IV fluids.  Monitor -Diuretics and lisinopril on hold  Diabetes mellitus type 2 with hyperglycemia -A1c 10.7.  Continue CBGs with SSI and Farxiga  Hypertension -Monitor blood pressure.  Currently on the higher side.  Antihypertensives on hold for now.  Hypothyroidism -Continue levothyroxine  Hyperlipidemia -Continue statin  Gout -Continue allopurinol  Morbid obesity -Outpatient follow-up  Leukocytosis -Mild.  Monitor  Hyponatremia -Mild.  Monitor  OSA -Not on CPAP    DVT prophylaxis: SCDs Code Status: Full Family Communication: None at bedside Disposition Plan: Status is: Inpatient Remains inpatient appropriate because: Of severity of  illness    Consultants: Podiatry  Procedures: None  Antimicrobials: Cefepime, vancomycin and Flagyl from 06/09/2021 onwards   Subjective: Patient seen and examined at bedside.  Complains of left foot pain.  No overnight fever, vomiting, worsening shortness of breath or chest pain reported.  Objective: Vitals:   06/09/21 1709 06/09/21 2058 06/10/21 0004 06/10/21 0534  BP: 129/84 (!) 148/94 (!) 133/93 129/82  Pulse: 74 79 79 85  Resp:  18 14 16   Temp:  97.6 F (36.4 C) (!) 97.5 F (36.4 C) (!) 97.5 F (36.4 C)  TempSrc:  Oral Oral Oral  SpO2: 96% 97% 96% 96%  Weight:      Height:        Intake/Output Summary (Last 24 hours) at 06/10/2021 0811 Last data filed at 06/10/2021 0500 Gross per 24 hour  Intake 3110.19 ml  Output --  Net 3110.19 ml   Filed Weights   06/09/21 0649  Weight: (!) 154.2 kg    Examination:  General exam: Appears calm and comfortable.  Currently on room air. Respiratory system: Bilateral decreased breath sounds at bases, no wheezing Cardiovascular system: S1 & S2 heard, Rate controlled Gastrointestinal system: Abdomen is morbidly obese, nondistended, soft and nontender. Normal bowel sounds heard. Extremities: No cyanosis, clubbing; trace lower extremity edema present.  Left foot dressing present Central nervous system: Alert and oriented. No focal neurological deficits. Moving extremities Skin: No obvious ecchymosis/rashes  psychiatry: Judgement and insight appear normal. Mood & affect appropriate.     Data Reviewed: I have personally reviewed following labs and imaging studies  CBC: Recent Labs  Lab 06/09/21 0747  WBC 11.1*  NEUTROABS 6.9  HGB 13.4  HCT 38.7*  MCV 91.5  PLT 99991111   Basic Metabolic Panel: Recent Labs  Lab  06/09/21 0747 06/10/21 0444  NA 134*  --   K 3.9  --   CL 103  --   CO2 23  --   GLUCOSE 171*  --   BUN 35*  --   CREATININE 2.46* 2.14*  CALCIUM 8.3*  --    GFR: Estimated Creatinine Clearance: 70.8  mL/min (A) (by C-G formula based on SCr of 2.14 mg/dL (H)). Liver Function Tests: Recent Labs  Lab 06/09/21 0747  AST 16  ALT 16  ALKPHOS 59  BILITOT 0.6  PROT 7.7  ALBUMIN 3.2*   No results for input(s): LIPASE, AMYLASE in the last 168 hours. No results for input(s): AMMONIA in the last 168 hours. Coagulation Profile: No results for input(s): INR, PROTIME in the last 168 hours. Cardiac Enzymes: No results for input(s): CKTOTAL, CKMB, CKMBINDEX, TROPONINI in the last 168 hours. BNP (last 3 results) No results for input(s): PROBNP in the last 8760 hours. HbA1C: Recent Labs    06/09/21 0747  HGBA1C 10.7*   CBG: Recent Labs  Lab 06/09/21 1232 06/09/21 1710 06/09/21 2059 06/10/21 0727  GLUCAP 123* 154* 177* 209*   Lipid Profile: No results for input(s): CHOL, HDL, LDLCALC, TRIG, CHOLHDL, LDLDIRECT in the last 72 hours. Thyroid Function Tests: No results for input(s): TSH, T4TOTAL, FREET4, T3FREE, THYROIDAB in the last 72 hours. Anemia Panel: No results for input(s): VITAMINB12, FOLATE, FERRITIN, TIBC, IRON, RETICCTPCT in the last 72 hours. Sepsis Labs: Recent Labs  Lab 06/09/21 0747 06/09/21 1453  LATICACIDVEN 1.4 1.3    Recent Results (from the past 240 hour(s))  Blood culture (routine x 2)     Status: None (Preliminary result)   Collection Time: 06/09/21  7:47 AM   Specimen: BLOOD  Result Value Ref Range Status   Specimen Description   Final    BLOOD BLOOD LEFT ARM Performed at [facility] [address], [place], [place] [address]    Special Requests   Final    BOTTLES DRAWN AEROBIC AND ANAEROBIC Blood Culture results may not be optimal due to an excessive volume of blood received in culture bottles Performed at [facility] [address], [place], [place] [address]    Culture   Final    NO GROWTH < 24 HOURS Performed at [facility] [address], [place], [place] [address]    Report Status PENDING   Incomplete  Blood culture (routine x 2)     Status: None (Preliminary result)   Collection Time: 06/09/21  8:05 AM   Specimen: BLOOD  Result Value Ref Range Status   Specimen Description   Final    BLOOD BLOOD RIGHT FOREARM Performed at [facility] [address], [place], [place] [address]    Special Requests   Final    BOTTLES DRAWN AEROBIC AND ANAEROBIC Blood Culture results may not be optimal due to an excessive volume of blood received in culture bottles Performed at [facility] [address], [place], [place] [address]    Culture   Final    NO GROWTH < 24 HOURS Performed at [facility] [address] [place], [place] [address]    Report Status PENDING  Incomplete         Radiology Studies: MR FOOT LEFT WO CONTRAST  Result Date: 06/09/2021 CLINICAL DATA:  Foot swelling, diabetic, osteomyelitis suspected, xray done EXAM: MRI OF THE LEFT FOOT WITHOUT CONTRAST TECHNIQUE: Multiplanar, multisequence MR imaging of the  left forefoot was performed. No intravenous contrast was administered. COMPARISON:  X-ray 06/09/2021 FINDINGS: Bones/Joint/Cartilage Shallow soft tissue ulceration at the plantar-lateral aspect of the distal forefoot adjacent to the fifth metatarsal head with bone marrow edema present in the fifth metatarsal head and fifth toe proximal phalanx. No erosion. Fatty T1 marrow signal at the sites is preserved. Small fifth MTP joint effusion. Moderate-severe arthropathy of the first MTP joint with well-defined marginal erosions along the first metatarsal head suggesting sequela of prior crystalline arthropathy. Hallux valgus deformity. Mild degenerative changes within the midfoot. Mild edema within the cuboid is likely reactive or stress related. Ligaments Intact Lisfranc ligament. Collateral ligaments of the forefoot appear intact. Muscles and Tendons Denervation changes of the foot musculature. Intact flexor and extensor tendons.  No tenosynovitis. Soft tissues Soft tissue ulceration, as above. Diffuse soft tissue swelling. Ill-defined fluid and edema over the dorsal aspect of the forefoot. No organized fluid collection evident on noncontrast images. IMPRESSION: 1. Shallow soft tissue ulceration at the plantar-lateral aspect of the distal forefoot adjacent to the fifth metatarsal head. Marrow edema in the fifth metatarsal head and fifth toe proximal phalanx suspicious for early acute osteomyelitis. 2. Small fifth MTP joint effusion concerning for septic arthritis. 3. Hallux valgus deformity with moderate-severe arthropathy of the first MTP joint with well-defined marginal erosions along the first metatarsal head suggesting sequela of prior crystalline arthropathy such as gout. Electronically Signed   By: Davina Poke D.O.   On: 06/09/2021 16:38   DG Foot Complete Left  Result Date: 06/09/2021 CLINICAL DATA:  Left foot pain. Concern for osteomyelitis. Chronic wound to the distal aspect of the plantar surface of the left foot next to the fifth toe. Discharge and pain. EXAM: LEFT FOOT - COMPLETE 3+ VIEW COMPARISON:  None Available. FINDINGS: Degenerative changes at the first MTP joint. No fractures. The reported chronic wound is identified near the fifth MTP joint. A punctate focus of high attenuation is identified within the wound on the oblique view. There is no bony erosion underlying the wound. Degenerative changes in the midfoot. IMPRESSION: 1. The patient's reported chronic wound is identified adjacent to the fifth MTP joint. There is a punctate focus of high attenuation projected over the wound on the oblique view which could represent a tiny foreign body. 2. No bony erosion to suggest osteomyelitis. MRI would be more sensitive. Electronically Signed   By: Dorise Bullion III M.D.   On: 06/09/2021 08:23   VAS [place] ABI WITH/WO TBI  Result Date: 06/09/2021  LOWER EXTREMITY DOPPLER STUDY Patient Name:  CORDARYL MIKEAL  Date of Exam:    06/09/2021 Medical Rec #: [id number]       Accession #:    [id number] Date of Birth: 12-Jun-1975       Patient Gender: M Patient Age:   36 years Exam Location:  [facility] Procedure:      VAS [place] ABI WITH/WO TBI Referring Phys: DAVID ORTIZ --------------------------------------------------------------------------------  Indications: Ulceration. High Risk Factors: Hypertension, Diabetes.  Comparison Study: No prior studies. Performing Technologist: Carlos Levering RVT  Examination Guidelines: A complete evaluation includes at minimum, Doppler waveform signals and systolic blood pressure reading at the level of bilateral brachial, anterior tibial, and posterior tibial arteries, when vessel segments are accessible. Bilateral testing is considered an integral part of a complete examination. Photoelectric Plethysmograph (PPG) waveforms and toe systolic pressure readings are included as required and additional duplex testing as needed. Limited examinations for reoccurring indications may be performed as noted.  ABI Findings: +---------+------------------+-----+---------+--------+ Right    Rt Pressure (mmHg)IndexWaveform Comment  +---------+------------------+-----+---------+--------+ Brachial 96                     triphasic         +---------+------------------+-----+---------+--------+ PTA      95                0.85 triphasic         +---------+------------------+-----+---------+--------+ DP       93                0.83 triphasic         +---------+------------------+-----+---------+--------+ Great Toe102               0.91                   +---------+------------------+-----+---------+--------+ +---------+------------------+-----+---------+-------+ Left     Lt Pressure (mmHg)IndexWaveform Comment +---------+------------------+-----+---------+-------+ Brachial 112                    triphasic        +---------+------------------+-----+---------+-------+ PTA      90                 0.80 biphasic         +---------+------------------+-----+---------+-------+ DP       94                0.84 biphasic         +---------+------------------+-----+---------+-------+ Great Toe78                0.70                  +---------+------------------+-----+---------+-------+ +-------+-----------+-----------+------------+------------+ ABI/TBIToday's ABIToday's TBIPrevious ABIPrevious TBI +-------+-----------+-----------+------------+------------+ Right  0.85       0.91                                +-------+-----------+-----------+------------+------------+ Left   0.84       0.7                                 +-------+-----------+-----------+------------+------------+  Summary: Right: Resting right ankle-brachial index indicates mild right lower extremity arterial disease. The right toe-brachial index is normal. Left: Resting left ankle-brachial index indicates mild left lower extremity arterial disease. The left toe-brachial index is normal. *See table(s) above for measurements and observations.     Preliminary    VAS [place] LOWER EXTREMITY VENOUS (DVT)  Result Date: 06/09/2021  Lower Venous DVT Study Patient Name:  ZAHN SHAAK  Date of Exam:   06/09/2021 Medical Rec #: [id number]       Accession #:    [id number] Date of Birth: September 16, 1975       Patient Gender: M Patient Age:   54 years Exam Location:  [facility] Procedure:      VAS [place] LOWER EXTREMITY VENOUS (DVT) Referring Phys: DAVID ORTIZ --------------------------------------------------------------------------------  Indications: Edema.  Risk Factors: None identified. Limitations: Body habitus and poor ultrasound/tissue interface. Comparison Study: No prior studies. Performing Technologist: Oliver Hum RVT  Examination Guidelines: A complete evaluation includes B-mode imaging, spectral Doppler, color Doppler, and power Doppler as needed of all accessible portions of each vessel. Bilateral  testing is considered an integral part of a complete examination. Limited examinations for reoccurring indications may be performed as noted. The reflux portion of  the exam is performed with the patient in reverse Trendelenburg.  +-----+---------------+---------+-----------+----------+--------------+ RIGHTCompressibilityPhasicitySpontaneityPropertiesThrombus Aging +-----+---------------+---------+-----------+----------+--------------+ CFV  Full           Yes      Yes                                 +-----+---------------+---------+-----------+----------+--------------+   +---------+---------------+---------+-----------+----------+--------------+ LEFT     CompressibilityPhasicitySpontaneityPropertiesThrombus Aging +---------+---------------+---------+-----------+----------+--------------+ CFV      Full           Yes      Yes                                 +---------+---------------+---------+-----------+----------+--------------+ SFJ      Full                                                        +---------+---------------+---------+-----------+----------+--------------+ FV Prox  Full                                                        +---------+---------------+---------+-----------+----------+--------------+ FV Mid   Full                                                        +---------+---------------+---------+-----------+----------+--------------+ FV DistalFull                                                        +---------+---------------+---------+-----------+----------+--------------+ PFV      Full                                                        +---------+---------------+---------+-----------+----------+--------------+ POP      Full           Yes      Yes                                 +---------+---------------+---------+-----------+----------+--------------+ PTV      Full                                                         +---------+---------------+---------+-----------+----------+--------------+ PERO     Full                                                        +---------+---------------+---------+-----------+----------+--------------+  Summary: RIGHT: - No evidence of common femoral vein obstruction.  LEFT: - There is no evidence of deep vein thrombosis in the lower extremity. However, portions of this examination were limited- see technologist comments above.  - No cystic structure found in the popliteal fossa.  *See table(s) above for measurements and observations.    Preliminary         Scheduled Meds:  allopurinol  300 mg Oral Daily   aspirin  81 mg Oral Daily   atorvastatin  80 mg Oral Daily   empagliflozin  25 mg Oral Daily   insulin aspart  0-20 Units Subcutaneous TID WC   levothyroxine  75 mcg Oral Q0600   Continuous Infusions:  ceFEPime (MAXIPIME) IV 2 g (06/10/21 0629)   lactated ringers Stopped (06/10/21 0253)   metronidazole Stopped (06/09/21 2306)   vancomycin            Aline August, MD [facility] 06/10/2021, 8:11 AM

## 2021-06-10 NOTE — Progress Notes (Signed)
  Transition of Care [facility]) Screening Note   Patient Details  Name: Barry [name] Date of Birth: 1975/02/11   Transition of Care [facility]) CM/SW Contact:    Rosell Khouri, Meriam Sprague, RN Phone Number: 06/10/2021, 2:14 PM    Transition of Care Department [facility]) has reviewed patient and no TOC needs have been identified at this time. We will continue to monitor patient advancement through interdisciplinary progression rounds. If new patient transition needs arise, please place a TOC consult.

## 2021-06-10 NOTE — Interval H&P Note (Signed)
History and Physical Interval Note:  06/10/2021 4:57 PM  Barry [name]  has presented today for surgery, with the diagnosis of TOE AMPUTATION.  The various methods of treatment have been discussed with the patient and family. After consideration of risks, benefits and other options for treatment, the patient has consented to  Procedure(s): 5th RAY AMPUTATION LEFT FOOT (Left) as a surgical intervention.  The patient's history has been reviewed, patient examined, no change in status, stable for surgery.  I have reviewed the patient's chart and labs.  Questions were answered to the patient's satisfaction.     [name]

## 2021-06-10 NOTE — Op Note (Signed)
OPERATIVE REPORT Patient name: Barry [name] MRN: [id number] DOB: [birth year]  DOS: 06/10/21  Preop Dx: osteomyelitis left fifth metatarsal Postop Dx: same  Procedure:  1.  Partial fifth ray amputation left foot  Surgeon: [name] DPM  Anesthesia: 50-50 mixture of 2% lidocaine plain with 0.5% Marcaine plain totaling 18 now infiltrated in the patient's left foot in a local block fashion  Hemostasis: Esmarch tourniquet left lower   EBL: minimal mL Materials: none Injectables: none Pathology: Deep wound cultures.  Fifth metatarsal bone Condition: The patient tolerated the procedure and anesthesia well. No complications noted or reported   Justification for procedure: The patient is a 46 y.o. male who presents today for surgical correction of a chronic worsening ulcer to the left foot with positive findings on MRI for osteomyelitis. All conservative modalities of been unsuccessful in providing any sort of satisfactory alleviation of symptoms with the patient. The patient was told benefits as well as possible side effects of the surgery. The patient consented for surgical correction. The patient consent form was reviewed. All patient questions were answered. No guarantees were expressed or implied. The patient and the surgeon both signed the patient consent form with the witness present and placed in the patient's chart.   Procedure in Detail: The patient was brought to the operating room, remained in the hospital bed in the supine position at which time an aseptic scrub and drape were performed about the patient's respective lower extremity after anesthesia was induced as described above. Attention was then directed to the surgical area where procedure number one commenced.  Procedure #1: Partial fifth ray amputation left foot A racquet type incision was planned and made about the fifth ray of the left foot.  The incision was carried down to the level of the fifth metatarsal bone  creating tenotomy's of the extensor and flexor tendons.  The fifth toe was grasped with a perforating towel clamp and distracted distally and disarticulated at the fifth MTP.  Additional soft tissue dissection was utilized to expose the distal half of the fifth metatarsal at which time a sagittal blade mounted a sagittal saw was utilized to create an osteotomy and a dorsal distal to proximal plantar orientation with a slight obliquity at the diaphysis of the fifth metatarsal.  The distal portion of the metatarsal was removed and placed in a sterile specimen container and sent to pathology.  At this time deep wound cultures were also taken and sent to pathology.  3 L of normal saline with pulse lavage was then utilized for copious irrigation.  Any additional necrotic nonviable tissue was sharply debrided away using a #15 scalpel.  After copious irrigation the amputation site was dried in preparation for primary closure.  Wide stainless steel skin staples were utilized to reapproximate superficial skin which was reinforced with 3-0 nylon suture.  Dry sterile compressive dressings were then applied to all previously mentioned incision sites about the patient's lower extremity. The Esmarch tourniquet which was used for hemostasis was removed.  The patient was then transferred from the operating room to the recovery room having tolerated the procedure and anesthesia well. All vital signs are stable. After a brief stay in the recovery room the patient was readmitted with adequate prescriptions for analgesia.   SURGICAL IMPRESSION: Intraoperatively the amputation site appears to be clean margins.  Healthy tissue.  No extensive necrotic tissue or purulence after appropriate resection and pulse lavage.  [name], DPM [facility]  Dr.  [name], DPM    [address] [place], [place] [address]                Office [phone]  Fax [phone]

## 2021-06-10 NOTE — H&P (View-Only) (Signed)
   PODIATRY PROGRESS NOTE  NAME Barry [name] MRN [id number] DOB 19-Nov-1975 DOA 06/09/2021   Chief complaint: Worsening ulcer positive for osteomyelitis left foot Chief Complaint  Patient presents with   Foot Pain     Past Medical History:  Diagnosis Date   Carpal tunnel syndrome    Diabetes mellitus without complication (HCC)    Gout    Hypercholesteremia    Hypothyroidism    Neuropathy    Obesity    Thyroid disease        Latest Ref Rng & Units 06/09/2021    7:47 AM 05/22/2021    4:16 AM 04/27/2019    8:11 AM  CBC  WBC 4.0 - 10.5 K/uL 11.1   8.1     Hemoglobin 13.0 - 17.0 g/dL 13.4   13.6   14.3    Hematocrit 39.0 - 52.0 % 38.7   39.6   42.0    Platelets 150 - 400 K/uL 302   319          Latest Ref Rng & Units 06/10/2021    4:44 AM 06/09/2021    7:47 AM 05/22/2021    4:16 AM  BMP  Glucose 70 - 99 mg/dL  171   263    BUN 6 - 20 mg/dL  35   29    Creatinine 0.61 - 1.24 mg/dL 2.14   2.46   2.01    Sodium 135 - 145 mmol/L  134   135    Potassium 3.5 - 5.1 mmol/L  3.9   3.6    Chloride 98 - 111 mmol/L  103   103    CO2 22 - 32 mmol/L  23   24    Calcium 8.9 - 10.3 mg/dL  8.3   8.7      ASSESSMENT/PLAN OF CARE Osteomyelitis left foot -Spoke with patient today regarding the findings on MRI consistent with osteomyelitis and septic arthritis of the fifth MTP left foot.  Patient states that he is ready for surgery.  Patient currently NPO.  Planning for surgery this evening after 5 PM.  All patient questions answered. -Preoperative orders placed.  Surgery will consist of partial fifth ray amputation left foot -Podiatry will continue to follow   Edrick Kins, DPM [facility]  Dr. Edrick Kins, DPM    [address], [place] [address]                Office [phone]  Fax [phone]

## 2021-06-10 NOTE — Transfer of Care (Signed)
Immediate Anesthesia Transfer of Care Note  Patient: Barry [name]  Procedure(s) Performed: 5th RAY AMPUTATION LEFT FOOT (Left)  Patient Location: PACU  Anesthesia Type:MAC  Level of Consciousness: awake, alert , oriented, drowsy and patient cooperative  Airway & Oxygen Therapy: Patient Spontanous Breathing and Patient connected to face mask oxygen  Post-op Assessment: Report given to RN and Post -op Vital signs reviewed and stable  Post vital signs: Reviewed and stable  Last Vitals:  Vitals Value Taken Time  BP 112/73 06/10/21 1841  Temp 36.6 C 06/10/21 1841  Pulse 95 06/10/21 1843  Resp 18 06/10/21 1843  SpO2 100 % 06/10/21 1843  Vitals shown include unvalidated device data.  Last Pain:  Vitals:   06/10/21 1534  TempSrc: Oral  PainSc: 8       Patients Stated Pain Goal: 0 (06/21/54 1537)  Complications: No notable events documented.

## 2021-06-10 NOTE — Anesthesia Preprocedure Evaluation (Addendum)
Anesthesia Evaluation  Patient identified by MRN, date of birth, ID band Patient awake    Reviewed: Allergy & Precautions, NPO status , Patient's Chart, lab work & pertinent test results  Airway Mallampati: I  TM Distance: >3 FB Neck ROM: Full    Dental  (+) Teeth Intact, Dental Advisory Given   Pulmonary sleep apnea ,    breath sounds clear to auscultation       Cardiovascular hypertension, Pt. on medications  Rhythm:Regular Rate:Normal     Neuro/Psych  Headaches, PSYCHIATRIC DISORDERS Depression  Neuromuscular disease    GI/Hepatic negative GI ROS, Neg liver ROS,   Endo/Other  diabetes, Type 2, Oral Hypoglycemic AgentsHypothyroidism   Renal/GU Renal disease     Musculoskeletal  (+) Arthritis ,   Abdominal (+) + obese,   Peds  Hematology   Anesthesia Other Findings   Reproductive/Obstetrics                            Anesthesia Physical Anesthesia Plan  ASA: 3  Anesthesia Plan: MAC   Post-op Pain Management:    Induction: Intravenous  PONV Risk Score and Plan: 2 and Ondansetron, Propofol infusion and Midazolam  Airway Management Planned: Natural Airway and Simple Face Mask  Additional Equipment: None  Intra-op Plan:   Post-operative Plan:   Informed Consent: I have reviewed the patients History and Physical, chart, labs and discussed the procedure including the risks, benefits and alternatives for the proposed anesthesia with the patient or authorized representative who has indicated his/her understanding and acceptance.       Plan Discussed with: CRNA  Anesthesia Plan Comments:       Anesthesia Quick Evaluation

## 2021-06-10 NOTE — Brief Op Note (Signed)
06/10/2021  6:41 PM  PATIENT:  Barry [name]  46 y.o. male  PRE-OPERATIVE DIAGNOSIS:  TOE AMPUTATION  POST-OPERATIVE DIAGNOSIS:  * No post-op diagnosis entered *  PROCEDURE:  Procedure(s): 5th RAY AMPUTATION LEFT FOOT (Left)  SURGEON:  Surgeon(s) and Role:    Edrick Kins, DPM - Primary  PHYSICIAN ASSISTANT:   ASSISTANTS: none   ANESTHESIA:   local and MAC 50:50 mixture of 2% lidocaine plain w/ 0.5% marcaine plain totaling 67m in a local block  EBL:  minimal   BLOOD ADMINISTERED:none  DRAINS: none   LOCAL MEDICATIONS USED:  MARCAINE    and LIDOCAINE   SPECIMEN:  Source of Specimen:  5th metatarsal left foot  DISPOSITION OF SPECIMEN:  PATHOLOGY  COUNTS:  YES  TOURNIQUET:  * Missing tourniquet times found for documented tourniquets in log: [id number]*  DICTATION: .Dragon Dictation  PLAN OF CARE: Admit to inpatient   PATIENT DISPOSITION:  PACU - hemodynamically stable.   Delay start of Pharmacological VTE agent (>24hrs) due to surgical blood loss or risk of bleeding: no

## 2021-06-10 NOTE — Plan of Care (Signed)
  Problem: Pain Managment: Goal: General experience of comfort will improve Outcome: Progressing   

## 2021-06-11 ENCOUNTER — Encounter (HOSPITAL_COMMUNITY): Payer: Self-pay | Admitting: Podiatry

## 2021-06-11 ENCOUNTER — Other Ambulatory Visit: Payer: Self-pay

## 2021-06-11 DIAGNOSIS — L97529 Non-pressure chronic ulcer of other part of left foot with unspecified severity: Secondary | ICD-10-CM | POA: Diagnosis not present

## 2021-06-11 DIAGNOSIS — E11621 Type 2 diabetes mellitus with foot ulcer: Secondary | ICD-10-CM

## 2021-06-11 LAB — BASIC METABOLIC PANEL
Anion gap: 6 (ref 5–15)
BUN: 36 mg/dL — ABNORMAL HIGH (ref 6–20)
CO2: 25 mmol/L (ref 22–32)
Calcium: 8.8 mg/dL — ABNORMAL LOW (ref 8.9–10.3)
Chloride: 107 mmol/L (ref 98–111)
Creatinine, Ser: 2.14 mg/dL — ABNORMAL HIGH (ref 0.61–1.24)
GFR, Estimated: 38 mL/min — ABNORMAL LOW (ref >60)
Glucose, Bld: 195 mg/dL — ABNORMAL HIGH (ref 70–99)
Potassium: 4.6 mmol/L (ref 3.5–5.1)
Sodium: 138 mmol/L (ref 135–145)

## 2021-06-11 LAB — URINALYSIS, ROUTINE W REFLEX MICROSCOPIC
Bilirubin Urine: NEGATIVE
Glucose, UA: >=500 mg/dL — AB
Hgb urine dipstick: NEGATIVE
Ketones, ur: NEGATIVE mg/dL
Leukocytes,Ua: NEGATIVE
Nitrite: NEGATIVE
Protein, ur: 100 mg/dL — AB
Specific Gravity, Urine: 1.012 (ref 1.005–1.030)
pH: 6 (ref 5.0–8.0)

## 2021-06-11 LAB — CBC WITH DIFFERENTIAL/PLATELET
Abs Immature Granulocytes: 0.03 10*3/uL (ref 0.00–0.07)
Basophils Absolute: 0.1 10*3/uL (ref 0.0–0.1)
Basophils Relative: 1 %
Eosinophils Absolute: 0.5 10*3/uL (ref 0.0–0.5)
Eosinophils Relative: 7 %
HCT: 38.5 % — ABNORMAL LOW (ref 39.0–52.0)
Hemoglobin: 12.8 g/dL — ABNORMAL LOW (ref 13.0–17.0)
Immature Granulocytes: 0 %
Lymphocytes Relative: 25 %
Lymphs Abs: 2 10*3/uL (ref 0.7–4.0)
MCH: 30.8 pg (ref 26.0–34.0)
MCHC: 33.2 g/dL (ref 30.0–36.0)
MCV: 92.5 fL (ref 80.0–100.0)
Monocytes Absolute: 0.8 10*3/uL (ref 0.1–1.0)
Monocytes Relative: 10 %
Neutro Abs: 4.6 10*3/uL (ref 1.7–7.7)
Neutrophils Relative %: 57 %
Platelets: 278 10*3/uL (ref 150–400)
RBC: 4.16 MIL/uL — ABNORMAL LOW (ref 4.22–5.81)
RDW: 13.4 % (ref 11.5–15.5)
WBC: 7.9 10*3/uL (ref 4.0–10.5)
nRBC: 0 % (ref 0.0–0.2)

## 2021-06-11 LAB — GLUCOSE, CAPILLARY
Glucose-Capillary: 207 mg/dL — ABNORMAL HIGH (ref 70–99)
Glucose-Capillary: 172 mg/dL — ABNORMAL HIGH (ref 70–99)
Glucose-Capillary: 188 mg/dL — ABNORMAL HIGH (ref 70–99)
Glucose-Capillary: 163 mg/dL — ABNORMAL HIGH (ref 70–99)

## 2021-06-11 LAB — MAGNESIUM: Magnesium: 2 mg/dL (ref 1.7–2.4)

## 2021-06-11 MED ORDER — ADULT MULTIVITAMIN W/MINERALS CH
1.0000 | ORAL_TABLET | Freq: Every day | ORAL | Status: DC
  Administered 2021-06-11 – 2021-06-12 (×2): 1 via ORAL
  Filled 2021-06-11 (×2): qty 1

## 2021-06-11 MED ORDER — JUVEN PO PACK
1.0000 | PACK | Freq: Two times a day (BID) | ORAL | Status: DC
  Administered 2021-06-11 – 2021-06-12 (×2): 1 via ORAL
  Filled 2021-06-11: qty 1

## 2021-06-11 MED ORDER — ENSURE MAX PROTEIN PO LIQD
11.0000 [oz_av] | Freq: Every day | ORAL | Status: DC
  Administered 2021-06-11 – 2021-06-12 (×2): 11 [oz_av] via ORAL
  Filled 2021-06-11 (×2): qty 330

## 2021-06-11 NOTE — Progress Notes (Signed)
PROGRESS NOTE  Barry [name] [id number] DOB: 31-Aug-1975 DOA: 06/09/2021 PCP: Barry Shivers, MD   LOS: 2 days   Brief Narrative / Interim history: 46 y.o. male with medical history significant of class III obesity,  type II DM, gout, hyperlipidemia, hypothyroidism, diabetic peripheral neuropathy, carpal tunnel syndrome, chronic kidney disease, chronic left foot wound presented with worsening left foot pain, foul-smelling discharge with weakness and chills despite being on doxycycline as an outpatient recently.  On presentation, creatinine was 2.46.  Left foot x-ray with chronic wound adjacent to the fifth MTP with questionable tiny foreign body.  No suggestion of osteomyelitis.  MRI was suggested.  MRI was suspicious for fifth metatarsal head and proximal phalanx acute osteomyelitis.  He was started on broad-spectrum antibiotics.  Podiatry was consulted.  He is status post fifth toe ray amputation on 5/22  Subjective / 24h Interval events: Doing well this [name].  Has some pain but it is fairly well controlled  Assesement and Plan: Principal Problem:   Diabetic ulcer of left foot associated with type 2 diabetes mellitus ([place]) Active Problems:   Hypothyroidism   Diabetic neuropathy ([place])   Dyslipidemia   Hypertension   OSA (obstructive sleep apnea)   Acute kidney injury superimposed on chronic kidney disease ([facility])   Hyponatremia   Class 3 obesity ([facility])   Type 2 diabetes mellitus with hyperglycemia ([facility])   Acute osteomyelitis of left foot ([facility])   Leukocytosis  Principal problem Acute left foot osteomyelitis/diabetic ulcer infection -MRI of the foot as above. Podiatry consulted, he was taken to the [facility] on 5/22 and he is status post fifth ray amputation on the left.  Intraoperative cultures are still pending.  Keep on broad-spectrum antibiotics.  Discussed with podiatry, possibly home tomorrow  Active problems AKI on CKD stage IIIa -Presented with creatinine of 2.46; creatinine  2.01 few weeks ago.  Creatinine has improved to 2.1 and has remained stable.  Hold diuretics and lisinopril   Diabetes mellitus type 2 with hyperglycemia -A1c 10.7.  Continue CBGs with SSI and Farxiga   Hypertension -continue to monitor blood pressure  Hypothyroidism -Continue levothyroxine  Hyperlipidemia -Continue statin  Gout -Continue allopurinol   Morbid obesity -based on a BMI of 42.5.  Outpatient follow-up.  He would benefit from weight loss   Leukocytosis -Mild.  Monitor  Hyponatremia -mild, now resolved   OSA -Not on CPAP  Scheduled Meds:  allopurinol  300 mg Oral Daily   aspirin  81 mg Oral Daily   atorvastatin  80 mg Oral Daily   empagliflozin  25 mg Oral Daily   insulin aspart  0-20 Units Subcutaneous TID WC   levothyroxine  75 mcg Oral Q0600   Continuous Infusions:  ceFEPime (MAXIPIME) IV 2 g (06/11/21 0526)   metronidazole 500 mg (06/11/21 0928)   vancomycin 1,500 mg (06/10/21 1030)   PRN Meds:.acetaminophen **OR** acetaminophen, ondansetron **OR** ondansetron (ZOFRAN) IV, oxyCODONE  Diet Orders (From admission, onward)     Start     Ordered   06/10/21 1858  Diet Carb Modified Fluid consistency: Thin; Room service appropriate? Yes  Diet effective now       Question Answer Comment  Calorie Level Medium 1600-2000   Fluid consistency: Thin   Room service appropriate? Yes      06/10/21 1857            DVT prophylaxis: SCDs Start: 06/10/21 1945 SCDs Start: 06/09/21 1145   Lab Results  Component Value Date   PLT 278 06/11/2021  Code Status: Full Code  Family Communication: no family at bedside   Status is: Inpatient  Remains inpatient appropriate because: Monitor cultures, antibiotics   Level of care: Telemetry  Consultants:  Podiatry   Objective: Vitals:   06/10/21 1958 06/10/21 2136 06/11/21 0157 06/11/21 0614  BP: 137/90 (!) 155/97 (!) 147/99 (!) 150/75  Pulse: 85 97 91 (!) 101  Resp: 18 16 18 18   Temp: (!) 97.5 F (36.4  C) (!) 97.3 F (36.3 C) 98 F (36.7 C) 98.1 F (36.7 C)  TempSrc: Oral Oral Oral Oral  SpO2: 98% 97% 95% 95%  Weight:      Height:        Intake/Output Summary (Last 24 hours) at 06/11/2021 1030 Last data filed at 06/11/2021 [id number] Gross per 24 hour  Intake 2180 ml  Output 1400 ml  Net 780 ml   Wt Readings from Last 3 Encounters:  06/09/21 (!) 154.2 kg  05/22/21 (!) 152.9 kg  04/13/19 (!) 155.1 kg    Examination:  Constitutional: NAD Eyes: no scleral icterus ENMT: Mucous membranes are moist.  Neck: normal, supple Respiratory: clear to auscultation bilaterally, no wheezing, no crackles. Normal respiratory effort.  Cardiovascular: Regular rate and rhythm, no murmurs / rubs / gallops.  Abdomen: non distended, no tenderness. Bowel sounds positive.  Musculoskeletal: no clubbing / cyanosis.  Skin: no rashes Neurologic: Nonfocal   Data Reviewed: I have independently reviewed following labs and imaging studies   CBC Recent Labs  Lab 06/09/21 0747 06/11/21 0455  WBC 11.1* 7.9  HGB 13.4 12.8*  HCT 38.7* 38.5*  PLT 302 278  MCV 91.5 92.5  MCH 31.7 30.8  MCHC 34.6 33.2  RDW 13.5 13.4  LYMPHSABS 2.7 2.0  MONOABS 1.0 0.8  EOSABS 0.3 0.5  BASOSABS 0.1 0.1    Recent Labs  Lab 06/09/21 0747 06/09/21 1453 06/10/21 0444 06/11/21 0455  NA 134*  --   --  138  K 3.9  --   --  4.6  CL 103  --   --  107  CO2 23  --   --  25  GLUCOSE 171*  --   --  195*  BUN 35*  --   --  36*  CREATININE 2.46*  --  2.14* 2.14*  CALCIUM 8.3*  --   --  8.8*  AST 16  --   --   --   ALT 16  --   --   --   ALKPHOS 59  --   --   --   BILITOT 0.6  --   --   --   ALBUMIN 3.2*  --   --   --   MG  --   --   --  2.0  CRP  --  4.4*  --   --   LATICACIDVEN 1.4 1.3  --   --   HGBA1C 10.7*  --   --   --     ------------------------------------------------------------------------------------------------------------------ No results for input(s): CHOL, HDL, LDLCALC, TRIG, CHOLHDL, LDLDIRECT in  the last 72 hours.  Lab Results  Component Value Date   HGBA1C 10.7 (H) 06/09/2021   ------------------------------------------------------------------------------------------------------------------ No results for input(s): TSH, T4TOTAL, T3FREE, THYROIDAB in the last 72 hours.  Invalid input(s): FREET3  Cardiac Enzymes No results for input(s): CKMB, TROPONINI, MYOGLOBIN in the last 168 hours.  Invalid input(s): CK ------------------------------------------------------------------------------------------------------------------    Component Value Date/Time   BNP 16.0 11/04/2018 0702    CBG: Recent Labs  Lab  06/10/21 1517 06/10/21 1704 06/10/21 1844 06/10/21 2138 06/11/21 0752  GLUCAP 181* 157* 158* 223* 207*    Recent Results (from the past 240 hour(s))  Blood culture (routine x 2)     Status: None (Preliminary result)   Collection Time: 06/09/21  7:47 AM   Specimen: BLOOD  Result Value Ref Range Status   Specimen Description   Final    BLOOD BLOOD LEFT ARM Performed at [facility], [place] [address], [place], [place] [address]    Special Requests   Final    BOTTLES DRAWN AEROBIC AND ANAEROBIC Blood Culture results may not be optimal due to an excessive volume of blood received in culture bottles Performed at [facility] [address], [place], [place] [address]    Culture   Final    NO GROWTH 2 DAYS Performed at [facility] [address] [place] [address], [place], [place] [address]    Report Status PENDING  Incomplete  Blood culture (routine x 2)     Status: None (Preliminary result)   Collection Time: 06/09/21  8:05 AM   Specimen: BLOOD  Result Value Ref Range Status   Specimen Description   Final    BLOOD BLOOD RIGHT FOREARM Performed at [facility] [address], [place], [place] [address]    Special Requests   Final    BOTTLES DRAWN AEROBIC AND ANAEROBIC Blood Culture results may not be optimal due to  an excessive volume of blood received in culture bottles Performed at [facility] [address] [place], [place] [address]    Culture   Final    NO GROWTH 2 DAYS Performed at [facility] [address] [place], [place] [address]    Report Status PENDING  Incomplete  Aerobic/Anaerobic Culture w Gram Stain (surgical/deep wound)     Status: None (Preliminary result)   Collection Time: 06/10/21  6:06 PM   Specimen: Wound  Result Value Ref Range Status   Specimen Description   Final    WOUND Performed at [facility] [address] [place], [place] [address]    Special Requests   Final    NONE 5TH METATARSAL LEFT FOOT Performed at [facility] [address], [place], [place] [address]    Gram Stain   Final    NO SQUAMOUS EPITHELIAL CELLS SEEN FEW WBC SEEN NO ORGANISMS SEEN    Culture   Final    NO GROWTH < 12 HOURS Performed at [facility], [place] [address], [place], [place] [address]    Report Status PENDING  Incomplete  Aerobic/Anaerobic Culture w Gram Stain (surgical/deep wound)     Status: None (Preliminary result)   Collection Time: 06/10/21  6:09 PM   Specimen: Wound  Result Value Ref Range Status   Specimen Description   Final    WOUND Performed at [facility] [address] [place], [place] [address]    Special Requests   Final    NONE BONE BIOPSY 5TH METATARSAL Performed at [facility] [address], [place], [place] [address]    Gram Stain   Final    NO SQUAMOUS EPITHELIAL CELLS SEEN FEW WBC SEEN NO ORGANISMS SEEN    Culture   Final    NO GROWTH < 12 HOURS Performed at [facility], [place] [address] [place], [place] [address]    Report Status PENDING  Incomplete  Radiology Studies: DG Foot Complete Left  Result Date: 06/10/2021 CLINICAL DATA:  Post amputation of the fifth ray left foot. EXAM: LEFT FOOT - COMPLETE 3+ VIEW  COMPARISON:  06/09/2021 FINDINGS: Interval postoperative changes with resection of the left fifth ray at the proximal metatarsal shaft level. Skin clips are present consistent with recent surgery. A margin of resection appears intact. No radiopaque foreign bodies or gas collections are identified in the soft tissues. Prominent degenerative changes are present in the first metatarsal-phalangeal joint. Degenerative changes also present in the interphalangeal and intertarsal joints. Mild dorsal soft tissue swelling. IMPRESSION: Postoperative changes with resection of the left fifth ray at the proximal metatarsal shaft level. Degenerative changes in the left foot. Electronically Signed   By: Lucienne Capers M.D.   On: 06/10/2021 20:12     Marzetta Board, MD, PhD [facility]  Between 7 am - 7 pm I am available, please contact me via Amion (for emergencies) or Securechat (non urgent messages)  Between 7 pm - 7 am I am not available, please contact night coverage MD/APP via Amion

## 2021-06-11 NOTE — Progress Notes (Signed)
Initial Nutrition Assessment  DOCUMENTATION CODES:   Morbid obesity  INTERVENTION:   -Ensure MAX Protein po BID, each supplement provides 150 kcal and 30 grams of protein   -1 packet Juven BID, each packet provides 95 calories, 2.5 grams of protein (collagen), and 9.8 grams of carbohydrate (3 grams sugar); also contains 7 grams of L-arginine and L-glutamine, 300 mg vitamin C, 15 mg vitamin E, 1.2 mcg vitamin B-12, 9.5 mg zinc, 200 mg calcium, and 1.5 g  Calcium Beta-hydroxy-Beta-methylbutyrate to support wound healing   -Multivitamin with minerals daily   NUTRITION DIAGNOSIS:   Increased nutrient needs related to wound healing as evidenced by estimated needs.  GOAL:   Patient will meet greater than or equal to 90% of their needs  MONITOR:   PO intake, Supplement acceptance, Labs, Weight trends, I & O's, Skin  REASON FOR ASSESSMENT:   Consult Wound healing  ASSESSMENT:   46 y.o. male with medical history significant of class III obesity,  type II DM, gout, hyperlipidemia, hypothyroidism, diabetic peripheral neuropathy, carpal tunnel syndrome, chronic kidney disease, chronic left foot wound presented with worsening left foot pain, foul-smelling discharge with weakness and chills despite being on doxycycline as an outpatient recently. Admitted for management of diabetic ulcer on left foot.  5/22: s/p  Partial fifth ray amputation left foot  Patient in room, not very talkative, sleepy.  Pt states he is eating well, ate 100% of his lunch. He is agreeable to receiving Ensure Max for additional protein. Will add Juven as well for wound healing.  Pt states he takes a daily MVI and Vitamin D3 at home.  Per weight records, no weight loss noted.  Medications reviewed.  Lab reviewed: CBGs: 157-223   NUTRITION - FOCUSED PHYSICAL EXAM:  No depletions noted.  Diet Order:   Diet Order             Diet Carb Modified Fluid consistency: Thin; Room service appropriate? Yes  Diet  effective now                   EDUCATION NEEDS:   Education needs have been addressed  Skin:  Skin Assessment: Skin Integrity Issues: Skin Integrity Issues:: Diabetic Ulcer Diabetic Ulcer: left foot  Last BM:  5/22  Height:   Ht Readings from Last 1 Encounters:  06/09/21 6\' 3"  (1.905 m)    Weight:   Wt Readings from Last 1 Encounters:  06/09/21 (!) 154.2 kg    BMI:  Body mass index is 42.5 kg/m.  Estimated Nutritional Needs:   Kcal:  2200-2400  Protein:  125-135g  Fluid:  2.2L/day  06/11/21, MS, RD, LDN Inpatient Clinical Dietitian Contact information available via Amion

## 2021-06-11 NOTE — Progress Notes (Signed)
Inpatient Diabetes Program Recommendations  AACE/ADA: New Consensus Statement on Inpatient Glycemic Control (2015)  Target Ranges:  Prepandial:   less than 140 mg/dL      Peak postprandial:   less than 180 mg/dL (1-2 hours)      Critically ill patients:  140 - 180 mg/dL   Lab Results  Component Value Date   GLUCAP 207 (H) 06/11/2021   HGBA1C 10.7 (H) 06/09/2021    Review of Glycemic Control  Diabetes history: DM2 Outpatient Diabetes medications: Humalog 75/25 100 units BID, Ozempic 0.5 mg weekly, Jardiance 25 mg QD Current orders for Inpatient glycemic control: Novolog 0-20 TID, Jardiance 25 mg QD  HgbA1C - 10.7%  Inpatient Diabetes Program Recommendations:    Add 70/30 20 units BID if CBGs > 140-180 mg/dL.  Pt on large amount of Humalog 75/25 100 units BID at home. Pt states he checks blood sugars and tries to eat healthy most of the time. Discussed HgbA1C of 10.7%, average blood sugar of 260 mg/dL. Stressed importance of improving his glucose control to around 7%, to reduce risk of both long and short-term complications. Discussed healthy eating, portion control and eliminating sugary beverages from diet. Emphasized how diet, exercise and stress management all play a role in controlling his blood sugars. Encouraged pt to eat less and exercise more to lose weight and make his body use his own insulin more efficiently. Answered questions.  To f/u with PCP for diabetes management.  Thank you. Ailene Ards, RD, LDN, CDE Inpatient Diabetes Coordinator [phone]

## 2021-06-11 NOTE — Progress Notes (Signed)
Subjective: 1 Day Post-Op Procedure(s) (LRB): 5th RAY AMPUTATION LEFT FOOT (Left) S/P fifth ray amputation LT foot.  DOS: 06/10/2021.  Patient doing well.  Resting comfortably in bed.  Pain tolerated with current regimen of oxycodone 10 mg every 6 hours as needed pain.  Dressings intact.  Objective: Vital signs in last 24 hours: Temp:  [97.3 F (36.3 C)-98.1 F (36.7 C)] 97.9 F (36.6 C) (05/23 1609) Pulse Rate:  [91-101] 93 (05/23 1609) Resp:  [16-18] 18 (05/23 1609) BP: (142-155)/(75-99) 142/89 (05/23 1609) SpO2:  [95 %-99 %] 99 % (05/23 1609)  Intake/Output from previous day: 05/22 0701 - 05/23 0700 In: 1940 [P.O.:690; I.V.:650; IV Piggyback:600] Out: 1400 [Urine:1400] Intake/Output this shift: No intake/output data recorded.  Recent Labs    06/09/21 0747 06/11/21 0455  HGB 13.4 12.8*   Recent Labs    06/09/21 0747 06/11/21 0455  WBC 11.1* 7.9  RBC 4.23 4.16*  HCT 38.7* 38.5*  PLT 302 278   Recent Labs    06/09/21 0747 06/10/21 0444 06/11/21 0455  NA 134*  --  138  K 3.9  --  4.6  CL 103  --  107  CO2 23  --  25  BUN 35*  --  36*  CREATININE 2.46* 2.14* 2.14*  GLUCOSE 171*  --  195*  CALCIUM 8.3*  --  8.8*   No results for input(s): LABPT, INR in the last 72 hours.     Neurovascular intact Incision: dressing C/D/I No strikethrough noted on dressings.  Incisions well coapted.  Sutures and staples intact.  No active bleeding.  Assessment/Plan: 1 Day Post-Op Procedure(s) (LRB): 5th RAY AMPUTATION LEFT FOOT (Left) Plan for discharge tomorrow -Cultures pending.  No growth <12 hours -Intraoperatively margins appeared clean.  Patient okay to be discharged on oral antibiotics x7-10 days -Dressings were changed today.  Leave dressings clean dry and intact until follow-up outpatient in office -Strict nonweightbearing surgical extremity using crutches or a walker -Okay to discharge from a podiatry/surgical standpoint  [name] 06/11/2021, 8:54 PM

## 2021-06-12 DIAGNOSIS — L97529 Non-pressure chronic ulcer of other part of left foot with unspecified severity: Secondary | ICD-10-CM | POA: Diagnosis not present

## 2021-06-12 DIAGNOSIS — E11621 Type 2 diabetes mellitus with foot ulcer: Secondary | ICD-10-CM | POA: Diagnosis not present

## 2021-06-12 LAB — GLUCOSE, CAPILLARY: Glucose-Capillary: 170 mg/dL — ABNORMAL HIGH (ref 70–99)

## 2021-06-12 MED ORDER — DOXYCYCLINE HYCLATE 100 MG PO CAPS: 100.0000 mg | ORAL_CAPSULE | Freq: Two times a day (BID) | ORAL | 0 refills | Status: DC

## 2021-06-12 MED ORDER — OXYCODONE HCL 10 MG PO TABS: 10.0000 mg | ORAL_TABLET | Freq: Four times a day (QID) | ORAL | 0 refills | Status: AC | PRN

## 2021-06-12 MED ORDER — AMOXICILLIN-POT CLAVULANATE 875-125 MG PO TABS: 1.0000 | ORAL_TABLET | Freq: Two times a day (BID) | ORAL | 0 refills | Status: DC

## 2021-06-12 NOTE — Discharge Summary (Signed)
Physician Discharge Summary  Barry [name] [id number] DOB: 11/18/1975 DOA: 06/09/2021  PCP: Maryella Shivers, MD  Admit date: 06/09/2021 Discharge date: 06/12/2021  Admitted From: home Disposition:  home  Recommendations for Outpatient Follow-up:  Follow up with Dr Amalia Hailey in 1 week  [place]: none Equipment/Devices: none  Discharge Condition: stable CODE STATUS: Full code Diet Orders (From admission, onward)     Start     Ordered   06/10/21 1858  Diet Carb Modified Fluid consistency: Thin; Room service appropriate? Yes  Diet effective now       Question Answer Comment  Calorie Level Medium 1600-2000   Fluid consistency: Thin   Room service appropriate? Yes      06/10/21 1857            HPI: Per admitting MD, Barry [name] is a 46 y.o. male with medical history significant of class III obesity with a current BMI of 42.5 kg/m, type II DM, gout, hyperlipidemia, hypothyroidism, diabetic peripheral neuropathy, carpal tunnel syndrome, chronic kidney disease who is coming to the emergency department due to infection of his left foot chronic wound with pain, purulent/foul-smelling discharge, generalized weakness and chills.  No fever or night sweats.  He was seen recently in the emergency department and was given doxycycline.  The patient stated that he completed that course.  He has not followed with his podiatrist Dr. Cannon Kettle yet.  He denied dyspnea, chest pain, palpitations, diaphoresis or pitting edema lower extremities.  No abdominal pain, nausea, emesis, diarrhea, constipation, melena hematochezia.  No flank pain, dysuria, frequency or hematuria.  Not sure what are his CBG readings recently.  No polyuria, polydipsia, polyphagia or blurred vision.  Hospital Course / Discharge diagnoses: Principal Problem:   Diabetic ulcer of left foot associated with type 2 diabetes mellitus ([place]) Active Problems:   Hypothyroidism   Diabetic neuropathy (HCC)   Dyslipidemia    Hypertension   OSA (obstructive sleep apnea)   Acute kidney injury superimposed on chronic kidney disease (HCC)   Hyponatremia   Class 3 obesity (HCC)   Type 2 diabetes mellitus with hyperglycemia (HCC)   Acute osteomyelitis of left foot (HCC)   Leukocytosis   Principal problem Acute left foot osteomyelitis/diabetic ulcer infection -MRI of the foot as above. 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  Per operative report there are clear margins.  Cultures are still pending.  Discussed with Dr. Amalia Hailey, will discharge home in stable condition with 10 additional days of oral antibiotics.  Please follow-up final culture results as an outpatient.  Continue pain control with oxycodone  Active problems AKI on CKD stage IIIa -Presented with creatinine of 2.46; creatinine 2.01 few weeks ago.  Creatinine has improved to 2.1 and has remained stable.  Would hold lisinopril/HCTZ on discharge, follow-up with nephrology as an outpatient  Diabetes mellitus type 2 with hyperglycemia -A1c 10.7.  Continue home regimen.  He will need better control as an outpatient  Hypertension -continue regimen as below.  Blood pressure acceptable off lisinopril/HCTZ Hypothyroidism -Continue levothyroxine Hyperlipidemia -Continue statin Gout -Continue allopurinol Morbid obesity -based on a BMI of 42.5.  Outpatient follow-up.  He would benefit from weight loss Leukocytosis -resolved Hyponatremia -mild, now resolved OSA -Not on CPAP  Sepsis ruled out   Discharge Instructions   Allergies as of 06/12/2021   No Known Allergies      Medication List     STOP taking these  medications    HYDROcodone-acetaminophen 5-325 MG tablet Commonly known as: NORCO/VICODIN   lisinopril-hydrochlorothiazide 10-12.5 MG tablet Commonly known as: ZESTORETIC   meloxicam 7.5 MG tablet Commonly known as: Mobic   oxyCODONE-acetaminophen 10-325 MG tablet Commonly known  as: Percocet       TAKE these medications    Accu-Chek Softclix Lancets lancets 1 each 3 (three) times daily.   allopurinol 300 MG tablet Commonly known as: ZYLOPRIM Take 300 mg by mouth daily.   amoxicillin-clavulanate 875-125 MG tablet Commonly known as: AUGMENTIN Take 1 tablet by mouth 2 (two) times daily for 10 days.   aspirin 81 MG chewable tablet Chew 81 mg by mouth daily.   atorvastatin 80 MG tablet Commonly known as: LIPITOR Take 80 mg by mouth daily.   doxycycline 100 MG capsule Commonly known as: VIBRAMYCIN Take 1 capsule (100 mg total) by mouth 2 (two) times daily for 10 days. One po bid x 7 days   furosemide 20 MG tablet Commonly known as: LASIX Take 20 mg by mouth daily.   gentamicin ointment 0.1 % Commonly known as: GARAMYCIN Apply 1 application. topically daily. For wound care   GlucoCom Blood Glucose Monitor Devi 1 each by Other route 3 (three) times daily.   HumaLOG Mix 75/25 (75-25) 100 UNIT/ML Susp injection Generic drug: insulin lispro protamine-lispro Inject 100 Units into the skin 2 (two) times a day.   Jardiance 25 MG Tabs tablet Generic drug: empagliflozin Take 25 mg by mouth daily.   levothyroxine 75 MCG tablet Commonly known as: SYNTHROID Take 75 mcg by mouth daily.   Oxycodone HCl 10 MG Tabs Take 1 tablet (10 mg total) by mouth every 6 (six) hours as needed for up to 5 days for moderate pain.   Ozempic (0.25 or 0.5 MG/DOSE) 2 MG/3ML Sopn Generic drug: Semaglutide(0.25 or 0.5MG /DOS) Inject 0.5 mg as directed once a week. fridays   pregabalin 75 MG capsule Commonly known as: Lyrica Take 1 capsule (75 mg total) by mouth 2 (two) times daily.   RELION INSULIN SYRINGE 1ML/31G 31G X 5/16" 1 ML Misc Generic drug: Insulin Syringe-Needle U-100 Inject 1 Syringe into the skin 2 (two) times daily.   silver sulfADIAZINE 1 % cream Commonly known as: Silvadene Apply pea-sized amount to wound daily.       Consultations: Podiatry    Procedures/Studies:  MR FOOT LEFT WO CONTRAST  Result Date: 06/09/2021 CLINICAL DATA:  Foot swelling, diabetic, osteomyelitis suspected, xray done EXAM: MRI OF THE LEFT FOOT WITHOUT CONTRAST TECHNIQUE: Multiplanar, multisequence MR imaging of the left forefoot was performed. No intravenous contrast was administered. COMPARISON:  X-ray 06/09/2021 FINDINGS: Bones/Joint/Cartilage Shallow soft tissue ulceration at the plantar-lateral aspect of the distal forefoot adjacent to the fifth metatarsal head with bone marrow edema present in the fifth metatarsal head and fifth toe proximal phalanx. No erosion. Fatty T1 marrow signal at the sites is preserved. Small fifth MTP joint effusion. Moderate-severe arthropathy of the first MTP joint with well-defined marginal erosions along the first metatarsal head suggesting sequela of prior crystalline arthropathy. Hallux valgus deformity. Mild degenerative changes within the midfoot. Mild edema within the cuboid is likely reactive or stress related. Ligaments Intact Lisfranc ligament. Collateral ligaments of the forefoot appear intact. Muscles and Tendons Denervation changes of the foot musculature. Intact flexor and extensor tendons. No tenosynovitis. Soft tissues Soft tissue ulceration, as above. Diffuse soft tissue swelling. Ill-defined fluid and edema over the dorsal aspect of the forefoot. No organized fluid collection evident on noncontrast images.  IMPRESSION: 1. Shallow soft tissue ulceration at the plantar-lateral aspect of the distal forefoot adjacent to the fifth metatarsal head. Marrow edema in the fifth metatarsal head and fifth toe proximal phalanx suspicious for early acute osteomyelitis. 2. Small fifth MTP joint effusion concerning for septic arthritis. 3. Hallux valgus deformity with moderate-severe arthropathy of the first MTP joint with well-defined marginal erosions along the first metatarsal head suggesting sequela of prior crystalline arthropathy such as  gout. Electronically Signed   By: Davina Poke D.O.   On: 06/09/2021 16:38   DG Foot Complete Left  Result Date: 06/10/2021 CLINICAL DATA:  Post amputation of the fifth ray left foot. EXAM: LEFT FOOT - COMPLETE 3+ VIEW COMPARISON:  06/09/2021 FINDINGS: Interval postoperative changes with resection of the left fifth ray at the proximal metatarsal shaft level. Skin clips are present consistent with recent surgery. A margin of resection appears intact. No radiopaque foreign bodies or gas collections are identified in the soft tissues. Prominent degenerative changes are present in the first metatarsal-phalangeal joint. Degenerative changes also present in the interphalangeal and intertarsal joints. Mild dorsal soft tissue swelling. IMPRESSION: Postoperative changes with resection of the left fifth ray at the proximal metatarsal shaft level. Degenerative changes in the left foot. Electronically Signed   By: Lucienne Capers M.D.   On: 06/10/2021 20:12   DG Foot Complete Left  Result Date: 06/09/2021 CLINICAL DATA:  Left foot pain. Concern for osteomyelitis. Chronic wound to the distal aspect of the plantar surface of the left foot next to the fifth toe. Discharge and pain. EXAM: LEFT FOOT - COMPLETE 3+ VIEW COMPARISON:  None Available. FINDINGS: Degenerative changes at the first MTP joint. No fractures. The reported chronic wound is identified near the fifth MTP joint. A punctate focus of high attenuation is identified within the wound on the oblique view. There is no bony erosion underlying the wound. Degenerative changes in the midfoot. IMPRESSION: 1. The patient's reported chronic wound is identified adjacent to the fifth MTP joint. There is a punctate focus of high attenuation projected over the wound on the oblique view which could represent a tiny foreign body. 2. No bony erosion to suggest osteomyelitis. MRI would be more sensitive. Electronically Signed   By: Dorise Bullion III M.D.   On: 06/09/2021  08:23   DG Foot Complete Left  Result Date: 05/22/2021 CLINICAL DATA:  Nonhealing wound lateral side of the left foot. EXAM: LEFT FOOT - COMPLETE 3+ VIEW COMPARISON:  None Available. FINDINGS: Three view study. No fracture. No subluxation or dislocation. Degenerative changes are seen in the MTP joint of the great toe in deformity of the great toe proximal phalanx suggests remote trauma. Soft tissue gas lateral to the little toe MTP joint is compatible with the reported history of a soft tissue wound. No underlying bony destruction to suggest osteomyelitis. IMPRESSION: 1. Soft tissue gas lateral to the little toe MTP joint compatible with the reported history of soft tissue wound. No underlying bony destruction to suggest osteomyelitis. 2. No fracture or dislocation. Electronically Signed   By: Misty Stanley M.D.   On: 05/22/2021 07:05   VAS [place] ABI WITH/WO TBI  Result Date: 06/10/2021  LOWER EXTREMITY DOPPLER STUDY Patient Name:  Barry [name]  Date of Exam:   06/09/2021 Medical Rec #: [id number]       Accession #:    [id number] Date of Birth: 06-25-1975       Patient Gender: M Patient Age:   7 years Exam Location:  [facility] Procedure:      VAS [place] ABI WITH/WO TBI Referring Phys: DAVID ORTIZ --------------------------------------------------------------------------------  Indications: Ulceration. High Risk Factors: Hypertension, Diabetes.  Comparison Study: No prior studies. Performing Technologist: Carlos Levering RVT  Examination Guidelines: A complete evaluation includes at minimum, Doppler waveform signals and systolic blood pressure reading at the level of bilateral brachial, anterior tibial, and posterior tibial arteries, when vessel segments are accessible. Bilateral testing is considered an integral part of a complete examination. Photoelectric Plethysmograph (PPG) waveforms and toe systolic pressure readings are included as required and additional duplex testing as needed. Limited examinations  for reoccurring indications may be performed as noted.  ABI Findings: +---------+------------------+-----+---------+--------+ Right    Rt Pressure (mmHg)IndexWaveform Comment  +---------+------------------+-----+---------+--------+ Brachial 96                     triphasic         +---------+------------------+-----+---------+--------+ PTA      95                0.85 triphasic         +---------+------------------+-----+---------+--------+ DP       93                0.83 triphasic         +---------+------------------+-----+---------+--------+ Great Toe102               0.91                   +---------+------------------+-----+---------+--------+ +---------+------------------+-----+---------+-------+ Left     Lt Pressure (mmHg)IndexWaveform Comment +---------+------------------+-----+---------+-------+ Brachial 112                    triphasic        +---------+------------------+-----+---------+-------+ PTA      90                0.80 biphasic         +---------+------------------+-----+---------+-------+ DP       94                0.84 biphasic         +---------+------------------+-----+---------+-------+ Great Toe78                0.70                  +---------+------------------+-----+---------+-------+ +-------+-----------+-----------+------------+------------+ ABI/TBIToday's ABIToday's TBIPrevious ABIPrevious TBI +-------+-----------+-----------+------------+------------+ Right  0.85       0.91                                +-------+-----------+-----------+------------+------------+ Left   0.84       0.7                                 +-------+-----------+-----------+------------+------------+  Summary: Right: Resting right ankle-brachial index indicates mild right lower extremity arterial disease. The right toe-brachial index is normal. Left: Resting left ankle-brachial index indicates mild left lower extremity arterial  disease. The left toe-brachial index is normal. *See table(s) above for measurements and observations.  Electronically signed by Monica Martinez MD on 06/10/2021 at 5:07:49 PM.    Final    VAS [place] LOWER EXTREMITY VENOUS (DVT)  Result Date: 06/10/2021  Lower Venous DVT Study Patient Name:  Barry [name]  Date of Exam:   06/09/2021 Medical Rec #: [id number]  Accession #:    [id number] Date of Birth: [birth year]       Patient Gender: M Patient Age:   13 years Exam Location:  [facility] Procedure:      VAS [place] LOWER EXTREMITY VENOUS (DVT) Referring Phys: DAVID ORTIZ --------------------------------------------------------------------------------  Indications: Edema.  Risk Factors: None identified. Limitations: Body habitus and poor ultrasound/tissue interface. Comparison Study: No prior studies. Performing Technologist: Oliver Hum RVT  Examination Guidelines: A complete evaluation includes B-mode imaging, spectral Doppler, color Doppler, and power Doppler as needed of all accessible portions of each vessel. Bilateral testing is considered an integral part of a complete examination. Limited examinations for reoccurring indications may be performed as noted. The reflux portion of the exam is performed with the patient in reverse Trendelenburg.  +-----+---------------+---------+-----------+----------+--------------+ RIGHTCompressibilityPhasicitySpontaneityPropertiesThrombus Aging +-----+---------------+---------+-----------+----------+--------------+ CFV  Full           Yes      Yes                                 +-----+---------------+---------+-----------+----------+--------------+   +---------+---------------+---------+-----------+----------+--------------+ LEFT     CompressibilityPhasicitySpontaneityPropertiesThrombus Aging +---------+---------------+---------+-----------+----------+--------------+ CFV      Full           Yes      Yes                                  +---------+---------------+---------+-----------+----------+--------------+ SFJ      Full                                                        +---------+---------------+---------+-----------+----------+--------------+ FV Prox  Full                                                        +---------+---------------+---------+-----------+----------+--------------+ FV Mid   Full                                                        +---------+---------------+---------+-----------+----------+--------------+ FV DistalFull                                                        +---------+---------------+---------+-----------+----------+--------------+ PFV      Full                                                        +---------+---------------+---------+-----------+----------+--------------+ POP      Full           Yes      Yes                                 +---------+---------------+---------+-----------+----------+--------------+  PTV      Full                                                        +---------+---------------+---------+-----------+----------+--------------+ PERO     Full                                                        +---------+---------------+---------+-----------+----------+--------------+    Summary: RIGHT: - No evidence of common femoral vein obstruction.  LEFT: - There is no evidence of deep vein thrombosis in the lower extremity. However, portions of this examination were limited- see technologist comments above.  - No cystic structure found in the popliteal fossa.  *See table(s) above for measurements and observations. Electronically signed by Monica Martinez MD on 06/10/2021 at 5:11:05 PM.    Final      Subjective: - no chest pain, shortness of breath, no abdominal pain, nausea or vomiting.   Discharge Exam: BP (!) 145/86 (BP Location: Right Arm)   Pulse 91   Temp 98.2 F (36.8 C) (Oral)   Resp 18   Ht 6\' 3"  (1.905  m)   Wt (!) 154.2 kg   SpO2 96%   BMI 42.50 kg/m   General: Pt is alert, awake, not in acute distress Cardiovascular: RRR, S1/S2 +, no rubs, no gallops Respiratory: CTA bilaterally, no wheezing, no rhonchi   The results of significant diagnostics from this hospitalization (including imaging, microbiology, ancillary and laboratory) are listed below for reference.     Microbiology: Recent Results (from the past 240 hour(s))  Blood culture (routine x 2)     Status: None (Preliminary result)   Collection Time: 06/09/21  7:47 AM   Specimen: BLOOD  Result Value Ref Range Status   Specimen Description   Final    BLOOD BLOOD LEFT ARM Performed at [facility] [address] [place], [place] [address]    Special Requests   Final    BOTTLES DRAWN AEROBIC AND ANAEROBIC Blood Culture results may not be optimal due to an excessive volume of blood received in culture bottles Performed at [facility] [address], [place], [place] [address]    Culture   Final    NO GROWTH 2 DAYS Performed at [facility] [address], [place], [place] [address]    Report Status PENDING  Incomplete  Blood culture (routine x 2)     Status: None (Preliminary result)   Collection Time: 06/09/21  8:05 AM   Specimen: BLOOD  Result Value Ref Range Status   Specimen Description   Final    BLOOD BLOOD RIGHT FOREARM Performed at [facility] [address], [place], [place] [address]    Special Requests   Final    BOTTLES DRAWN AEROBIC AND ANAEROBIC Blood Culture results may not be optimal due to an excessive volume of blood received in culture bottles Performed at [facility] [address], [place], [place] [address]    Culture   Final    NO GROWTH 2 DAYS Performed at [facility] [address], [place], [place] [address]  Report Status PENDING  Incomplete  Aerobic/Anaerobic Culture w Gram Stain  (surgical/deep wound)     Status: None (Preliminary result)   Collection Time: 06/10/21  6:06 PM   Specimen: Wound  Result Value Ref Range Status   Specimen Description   Final    WOUND Performed at [facility] [address] [place], [place] [address]    Special Requests   Final    NONE 5TH METATARSAL LEFT FOOT Performed at [facility] [address] [place], [place] [address]    Gram Stain   Final    NO SQUAMOUS EPITHELIAL CELLS SEEN FEW WBC SEEN NO ORGANISMS SEEN    Culture   Final    NO GROWTH < 12 HOURS Performed at [facility], [place] [address] [place], [place] [address]    Report Status PENDING  Incomplete  Aerobic/Anaerobic Culture w Gram Stain (surgical/deep wound)     Status: None (Preliminary result)   Collection Time: 06/10/21  6:09 PM   Specimen: Wound  Result Value Ref Range Status   Specimen Description   Final    WOUND Performed at [facility] [address] [place], [place] [address]    Special Requests   Final    NONE BONE BIOPSY 5TH METATARSAL Performed at [facility] [address], [place], [place] [address]    Gram Stain   Final    NO SQUAMOUS EPITHELIAL CELLS SEEN FEW WBC SEEN NO ORGANISMS SEEN    Culture   Final    NO GROWTH < 12 HOURS Performed at [facility], [place] [address] [place], [place] [address]    Report Status PENDING  Incomplete     Labs: Basic Metabolic Panel: Recent Labs  Lab 06/09/21 0747 06/10/21 0444 06/11/21 0455  NA 134*  --  138  K 3.9  --  4.6  CL 103  --  107  CO2 23  --  25  GLUCOSE 171*  --  195*  BUN 35*  --  36*  CREATININE 2.46* 2.14* 2.14*  CALCIUM 8.3*  --  8.8*  MG  --   --  2.0   Liver Function Tests: Recent Labs  Lab 06/09/21 0747  AST 16  ALT 16  ALKPHOS 59  BILITOT 0.6  PROT 7.7  ALBUMIN 3.2*   CBC: Recent Labs  Lab 06/09/21 0747 06/11/21 0455  WBC 11.1* 7.9  NEUTROABS 6.9 4.6  HGB  13.4 12.8*  HCT 38.7* 38.5*  MCV 91.5 92.5  PLT 302 278   CBG: Recent Labs  Lab 06/10/21 2138 06/11/21 0752 06/11/21 1208 06/11/21 1607 06/11/21 2128  GLUCAP 223* 207* 172* 188* 163*   Hgb A1c Recent Labs    06/09/21 0747  HGBA1C 10.7*   Lipid Profile No results for input(s): CHOL, HDL, LDLCALC, TRIG, CHOLHDL, LDLDIRECT in the last 72 hours. Thyroid function studies No results for input(s): TSH, T4TOTAL, T3FREE, THYROIDAB in the last 72 hours.  Invalid input(s): FREET3 Urinalysis    Component Value Date/Time   COLORURINE STRAW (A) 06/11/2021 [place] 06/11/2021 0647   LABSPEC 1.012 06/11/2021 0647   PHURINE 6.0 06/11/2021 0647   GLUCOSEU >=500 (A) 06/11/2021 0647   HGBUR NEGATIVE 06/11/2021 [place] 06/11/2021 [place] 06/11/2021 0647   PROTEINUR 100 (A) 06/11/2021 0647   NITRITE NEGATIVE 06/11/2021 0647   LEUKOCYTESUR NEGATIVE 06/11/2021 0647    FURTHER DISCHARGE  INSTRUCTIONS:   Get Medicines reviewed and adjusted: Please take all your medications with you for your next visit with your Primary MD   Laboratory/radiological data: Please request your Primary MD to go over all hospital tests and procedure/radiological results at the follow up, please ask your Primary MD to get all Hospital records sent to his/her office.   In some cases, they will be blood work, cultures and biopsy results pending at the time of your discharge. Please request that your primary care M.D. goes through all the records of your hospital data and follows up on these results.   Also Note the following: If you experience worsening of your admission symptoms, develop shortness of breath, life threatening emergency, suicidal or homicidal thoughts you must seek medical attention immediately by calling 911 or calling your MD immediately  if symptoms less severe.   You must read complete instructions/literature along with all the possible  adverse reactions/side effects for all the Medicines you take and that have been prescribed to you. Take any new Medicines after you have completely understood and accpet all the possible adverse reactions/side effects.    Do not drive when taking Pain medications or sleeping medications (Benzodaizepines)   Do not take more than prescribed Pain, Sleep and Anxiety Medications. It is not advisable to combine anxiety,sleep and pain medications without talking with your primary care practitioner   Special Instructions: If you have smoked or chewed Tobacco  in the last 2 yrs please stop smoking, stop any regular Alcohol  and or any Recreational drug use.   Wear Seat belts while driving.   Please note: You were cared for by a hospitalist during your hospital stay. Once you are discharged, your primary care physician will handle any further medical issues. Please note that NO REFILLS for any discharge medications will be authorized once you are discharged, as it is imperative that you return to your primary care physician (or establish a relationship with a primary care physician if you do not have one) for your post hospital discharge needs so that they can reassess your need for medications and monitor your lab values.  Time coordinating discharge: 35 minutes  SIGNED:  Marzetta Board, MD, PhD 06/12/2021, 7:15 AM

## 2021-06-13 ENCOUNTER — Telehealth: Payer: Self-pay | Admitting: Podiatry

## 2021-06-13 NOTE — Telephone Encounter (Signed)
Spoke with patient,explained that we must have a form from employer to complete,said that we should already have documentation/letter to complete for short term disability but he will call them and have them fax the form again.

## 2021-06-13 NOTE — Telephone Encounter (Signed)
Pt called was discharged from the hospital on 5/24 after SX this wk. He stated that he will be out of work for 6 wks per Dr. Logan Bores so he needs a letter/documentation faxed to his employer, [employer]. He did not provide the info needed to fax. Please advise.

## 2021-06-14 LAB — CULTURE, BLOOD (ROUTINE X 2)
Culture: NO GROWTH
Culture: NO GROWTH

## 2021-06-15 LAB — AEROBIC/ANAEROBIC CULTURE W GRAM STAIN (SURGICAL/DEEP WOUND)
Culture: NO GROWTH
Gram Stain: NONE SEEN
Gram Stain: NONE SEEN

## 2021-06-19 ENCOUNTER — Ambulatory Visit (INDEPENDENT_AMBULATORY_CARE_PROVIDER_SITE_OTHER): Payer: BC Managed Care – PPO | Admitting: Podiatry

## 2021-06-19 ENCOUNTER — Encounter: Payer: Self-pay | Admitting: Podiatry

## 2021-06-19 DIAGNOSIS — Z9889 Other specified postprocedural states: Secondary | ICD-10-CM

## 2021-06-19 MED ORDER — OXYCODONE-ACETAMINOPHEN 5-325 MG PO TABS: 1.0000 | ORAL_TABLET | Freq: Four times a day (QID) | ORAL | 0 refills | Status: DC | PRN

## 2021-06-21 ENCOUNTER — Encounter (HOSPITAL_COMMUNITY): Payer: Self-pay | Admitting: Emergency Medicine

## 2021-06-21 ENCOUNTER — Emergency Department (HOSPITAL_COMMUNITY): Payer: BC Managed Care – PPO

## 2021-06-21 ENCOUNTER — Inpatient Hospital Stay (HOSPITAL_COMMUNITY)
Admission: EM | Admit: 2021-06-21 | Discharge: 2021-06-27 | DRG: 862 | Disposition: A | Discharge status: Home / Self Care | Payer: BC Managed Care – PPO | Attending: Internal Medicine | Admitting: Internal Medicine

## 2021-06-21 DIAGNOSIS — M1000 Idiopathic gout, unspecified site: Secondary | ICD-10-CM | POA: Diagnosis not present

## 2021-06-21 DIAGNOSIS — E1142 Type 2 diabetes mellitus with diabetic polyneuropathy: Secondary | ICD-10-CM | POA: Diagnosis present

## 2021-06-21 DIAGNOSIS — E8809 Other disorders of plasma-protein metabolism, not elsewhere classified: Secondary | ICD-10-CM | POA: Diagnosis present

## 2021-06-21 DIAGNOSIS — Z6841 Body Mass Index (BMI) 40.0 and over, adult: Secondary | ICD-10-CM | POA: Diagnosis not present

## 2021-06-21 DIAGNOSIS — M10032 Idiopathic gout, left wrist: Secondary | ICD-10-CM | POA: Diagnosis present

## 2021-06-21 DIAGNOSIS — Z7984 Long term (current) use of oral hypoglycemic drugs: Secondary | ICD-10-CM

## 2021-06-21 DIAGNOSIS — E1169 Type 2 diabetes mellitus with other specified complication: Secondary | ICD-10-CM | POA: Diagnosis not present

## 2021-06-21 DIAGNOSIS — E785 Hyperlipidemia, unspecified: Secondary | ICD-10-CM | POA: Diagnosis present

## 2021-06-21 DIAGNOSIS — K8020 Calculus of gallbladder without cholecystitis without obstruction: Secondary | ICD-10-CM | POA: Diagnosis not present

## 2021-06-21 DIAGNOSIS — Y835 Amputation of limb(s) as the cause of abnormal reaction of the patient, or of later complication, without mention of misadventure at the time of the procedure: Secondary | ICD-10-CM | POA: Diagnosis present

## 2021-06-21 DIAGNOSIS — E1122 Type 2 diabetes mellitus with diabetic chronic kidney disease: Secondary | ICD-10-CM | POA: Diagnosis present

## 2021-06-21 DIAGNOSIS — E1165 Type 2 diabetes mellitus with hyperglycemia: Secondary | ICD-10-CM | POA: Diagnosis present

## 2021-06-21 DIAGNOSIS — E872 Acidosis, unspecified: Secondary | ICD-10-CM | POA: Diagnosis present

## 2021-06-21 DIAGNOSIS — A419 Sepsis, unspecified organism: Secondary | ICD-10-CM | POA: Diagnosis not present

## 2021-06-21 DIAGNOSIS — R079 Chest pain, unspecified: Secondary | ICD-10-CM | POA: Diagnosis not present

## 2021-06-21 DIAGNOSIS — I129 Hypertensive chronic kidney disease with stage 1 through stage 4 chronic kidney disease, or unspecified chronic kidney disease: Secondary | ICD-10-CM | POA: Diagnosis not present

## 2021-06-21 DIAGNOSIS — M109 Gout, unspecified: Secondary | ICD-10-CM | POA: Diagnosis present

## 2021-06-21 DIAGNOSIS — T8141XA Infection following a procedure, superficial incisional surgical site, initial encounter: Secondary | ICD-10-CM | POA: Diagnosis not present

## 2021-06-21 DIAGNOSIS — R600 Localized edema: Secondary | ICD-10-CM | POA: Diagnosis not present

## 2021-06-21 DIAGNOSIS — E119 Type 2 diabetes mellitus without complications: Secondary | ICD-10-CM

## 2021-06-21 DIAGNOSIS — M869 Osteomyelitis, unspecified: Secondary | ICD-10-CM | POA: Diagnosis not present

## 2021-06-21 DIAGNOSIS — M25522 Pain in left elbow: Secondary | ICD-10-CM | POA: Diagnosis not present

## 2021-06-21 DIAGNOSIS — M7989 Other specified soft tissue disorders: Secondary | ICD-10-CM | POA: Diagnosis not present

## 2021-06-21 DIAGNOSIS — E7800 Pure hypercholesterolemia, unspecified: Secondary | ICD-10-CM | POA: Diagnosis present

## 2021-06-21 DIAGNOSIS — M19042 Primary osteoarthritis, left hand: Secondary | ICD-10-CM | POA: Diagnosis not present

## 2021-06-21 DIAGNOSIS — Z89422 Acquired absence of other left toe(s): Secondary | ICD-10-CM | POA: Diagnosis not present

## 2021-06-21 DIAGNOSIS — K760 Fatty (change of) liver, not elsewhere classified: Secondary | ICD-10-CM | POA: Diagnosis not present

## 2021-06-21 DIAGNOSIS — D649 Anemia, unspecified: Secondary | ICD-10-CM | POA: Diagnosis not present

## 2021-06-21 DIAGNOSIS — N1832 Chronic kidney disease, stage 3b: Secondary | ICD-10-CM | POA: Diagnosis not present

## 2021-06-21 DIAGNOSIS — Z794 Long term (current) use of insulin: Secondary | ICD-10-CM

## 2021-06-21 DIAGNOSIS — M19072 Primary osteoarthritis, left ankle and foot: Secondary | ICD-10-CM | POA: Diagnosis not present

## 2021-06-21 DIAGNOSIS — L03116 Cellulitis of left lower limb: Secondary | ICD-10-CM | POA: Diagnosis not present

## 2021-06-21 DIAGNOSIS — N1831 Chronic kidney disease, stage 3a: Secondary | ICD-10-CM | POA: Diagnosis not present

## 2021-06-21 DIAGNOSIS — Z79899 Other long term (current) drug therapy: Secondary | ICD-10-CM | POA: Diagnosis not present

## 2021-06-21 DIAGNOSIS — E1100 Type 2 diabetes mellitus with hyperosmolarity without nonketotic hyperglycemic-hyperosmolar coma (NKHHC): Secondary | ICD-10-CM | POA: Diagnosis not present

## 2021-06-21 DIAGNOSIS — R509 Fever, unspecified: Secondary | ICD-10-CM | POA: Diagnosis not present

## 2021-06-21 DIAGNOSIS — R000 Tachycardia, unspecified: Secondary | ICD-10-CM | POA: Diagnosis not present

## 2021-06-21 DIAGNOSIS — T8144XA Sepsis following a procedure, initial encounter: Secondary | ICD-10-CM | POA: Diagnosis present

## 2021-06-21 DIAGNOSIS — R109 Unspecified abdominal pain: Secondary | ICD-10-CM | POA: Diagnosis not present

## 2021-06-21 DIAGNOSIS — E039 Hypothyroidism, unspecified: Secondary | ICD-10-CM | POA: Diagnosis not present

## 2021-06-21 DIAGNOSIS — S98912A Complete traumatic amputation of left foot, level unspecified, initial encounter: Secondary | ICD-10-CM | POA: Diagnosis not present

## 2021-06-21 LAB — URINALYSIS, ROUTINE W REFLEX MICROSCOPIC
Bilirubin Urine: NEGATIVE
Glucose, UA: >=500 mg/dL — AB
Hgb urine dipstick: NEGATIVE
Ketones, ur: NEGATIVE mg/dL
Leukocytes,Ua: NEGATIVE
Nitrite: NEGATIVE
Protein, ur: >=300 mg/dL — AB
Specific Gravity, Urine: 1.017 (ref 1.005–1.030)
pH: 5 (ref 5.0–8.0)

## 2021-06-21 IMAGING — DX DG FOOT COMPLETE 3+V*L*
3 series · 3 of 3 positions shown · non-contrast
Comparison: [DATE]

CLINICAL DATA: Possible sepsis recent foot amputation

EXAM:
LEFT FOOT - COMPLETE 3+ VIEW

[foot ap]
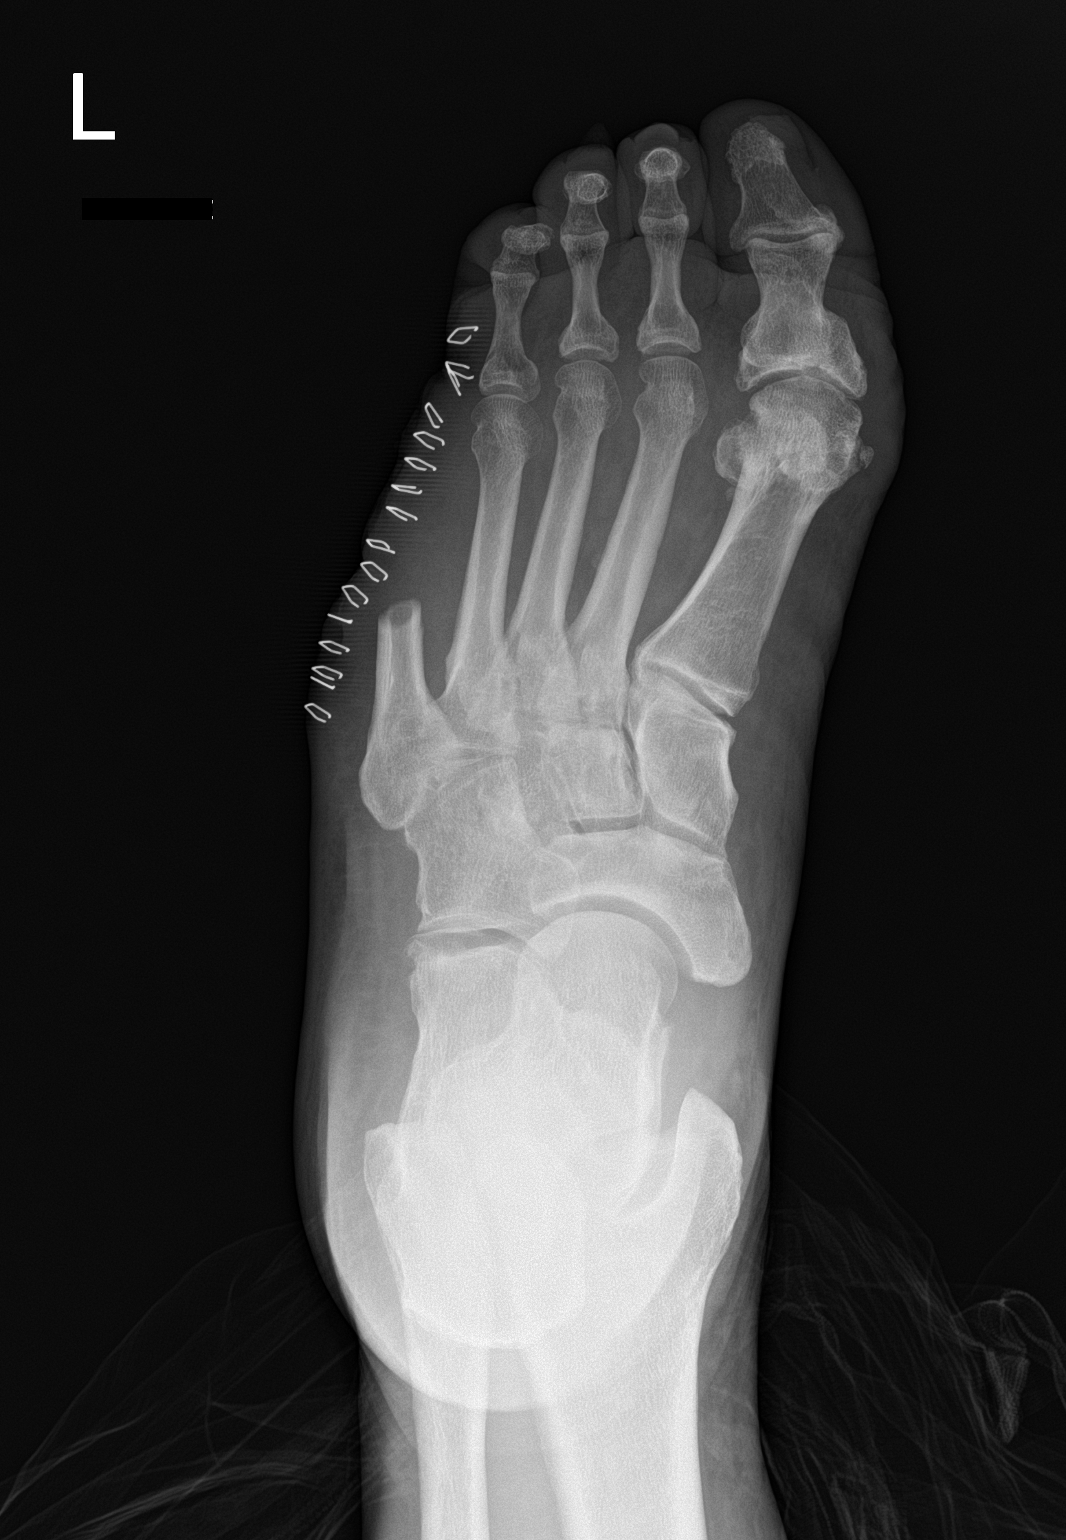

[foot obl]
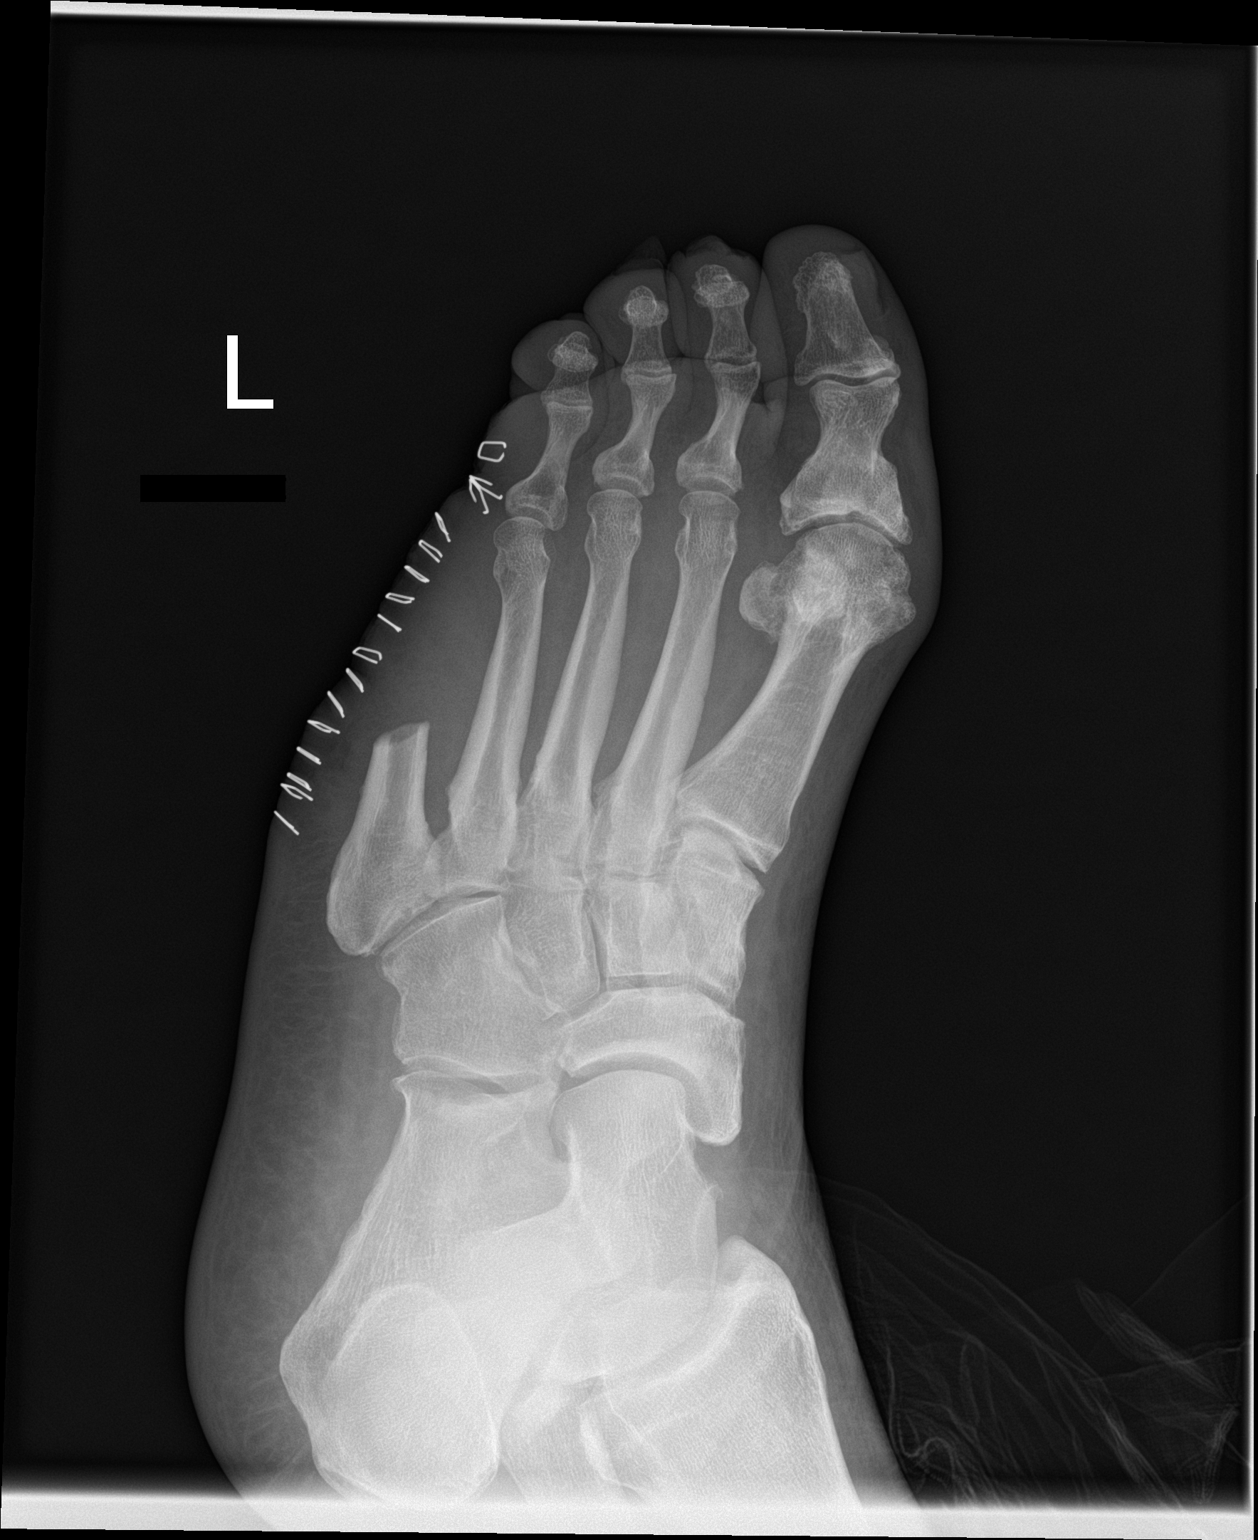

[foot lat]
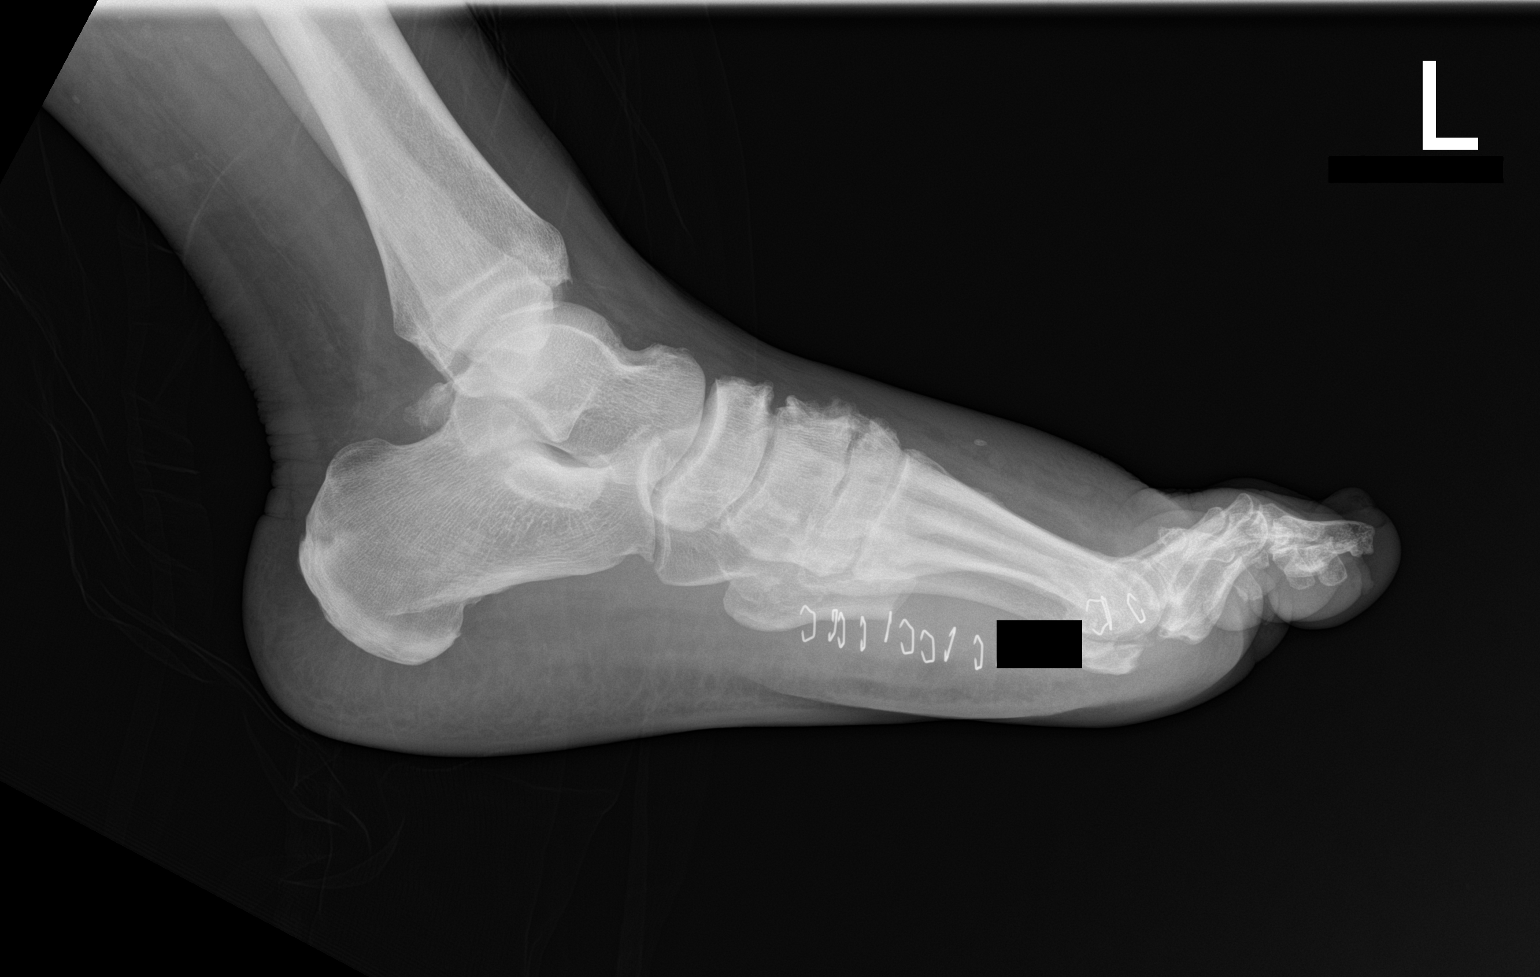

[3 of 3 positions shown; findings below may reference images not displayed]

FINDINGS: Patient is status post transmetatarsal amputation of the fifth
digit. Cut margins remain smooth. No osseous destructive change.
Cutaneous staples remain in place. Degenerative changes at the first
MTP joint.
IMPRESSION: Stable transmetatarsal amputation of the fifth digit without acute
osseous abnormality

## 2021-06-21 IMAGING — DX DG CHEST 1V PORT
1 series · 1 of 1 positions shown · non-contrast
Comparison: [DATE]

CLINICAL DATA: Right flank pain and fever

EXAM:
PORTABLE CHEST 1 VIEW

[chest ap]
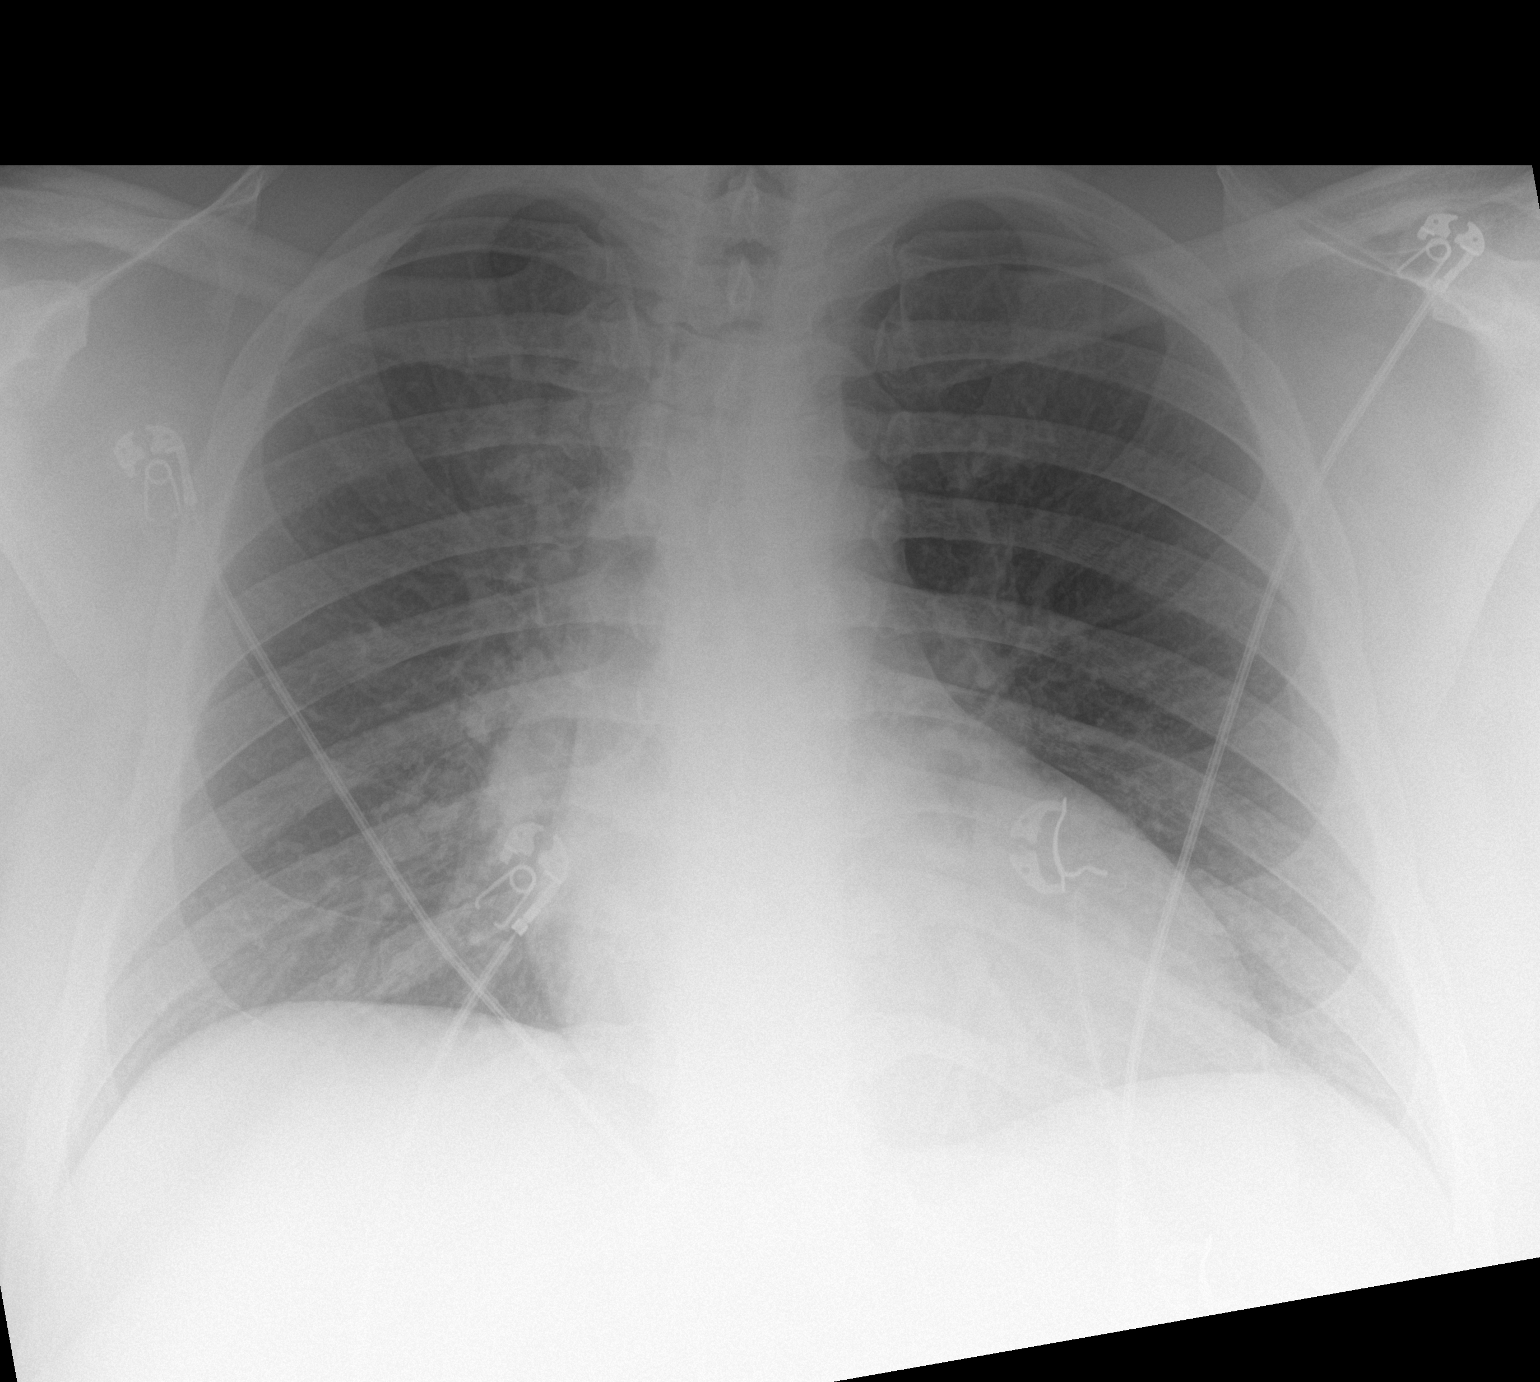

[1 of 1 positions shown; findings below may reference images not displayed]

FINDINGS: The heart size and mediastinal contours are within normal limits.
Both lungs are clear. The visualized skeletal structures are
unremarkable.
IMPRESSION: No active disease.

## 2021-06-21 IMAGING — DX DG ELBOW COMPLETE 3+V*L*
4 series · 4 of 4 positions shown · non-contrast
Comparison: None Available.

CLINICAL DATA: Left elbow pain with swelling

EXAM:
LEFT ELBOW - COMPLETE 3+ VIEW

[elbow ap]
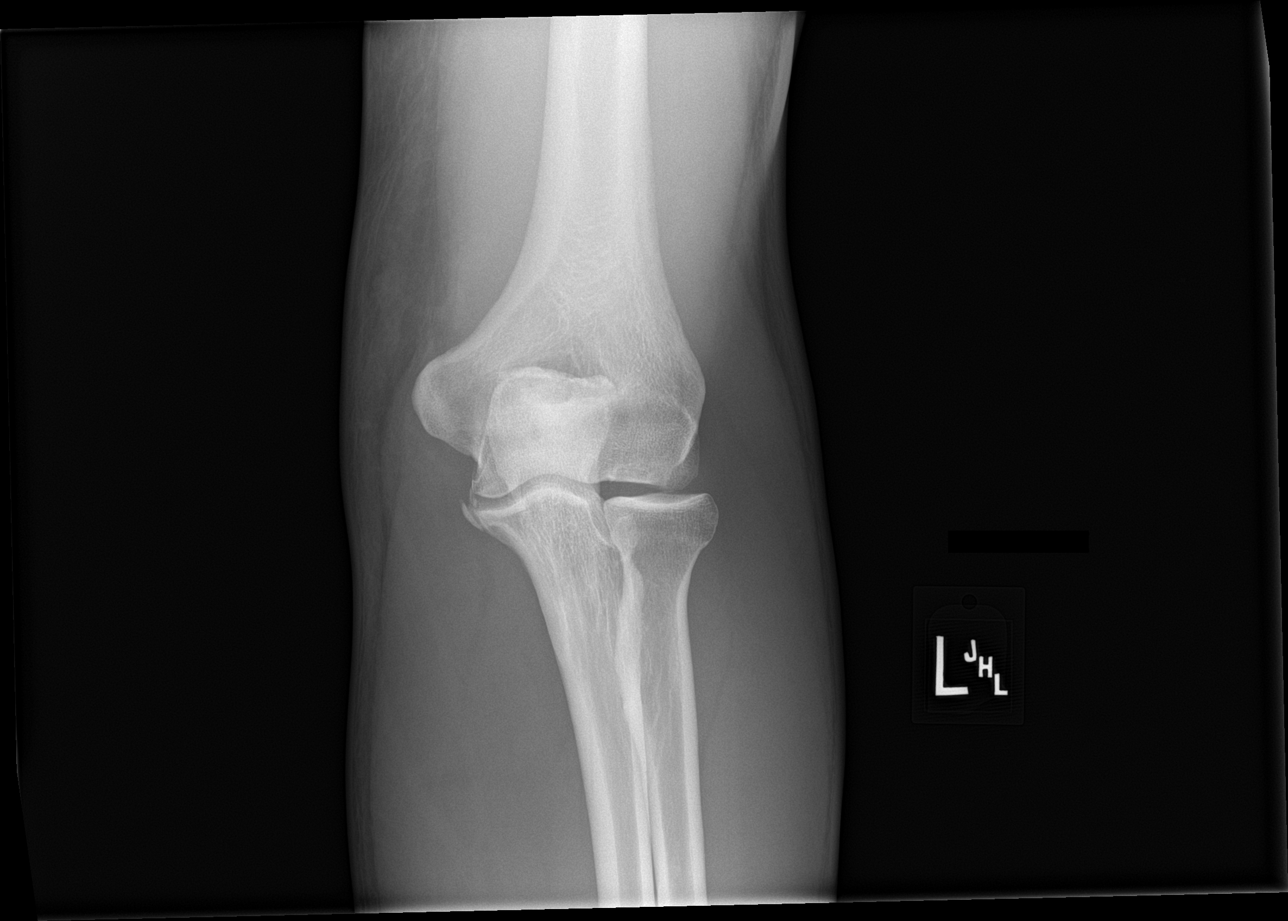

[elbow lat]
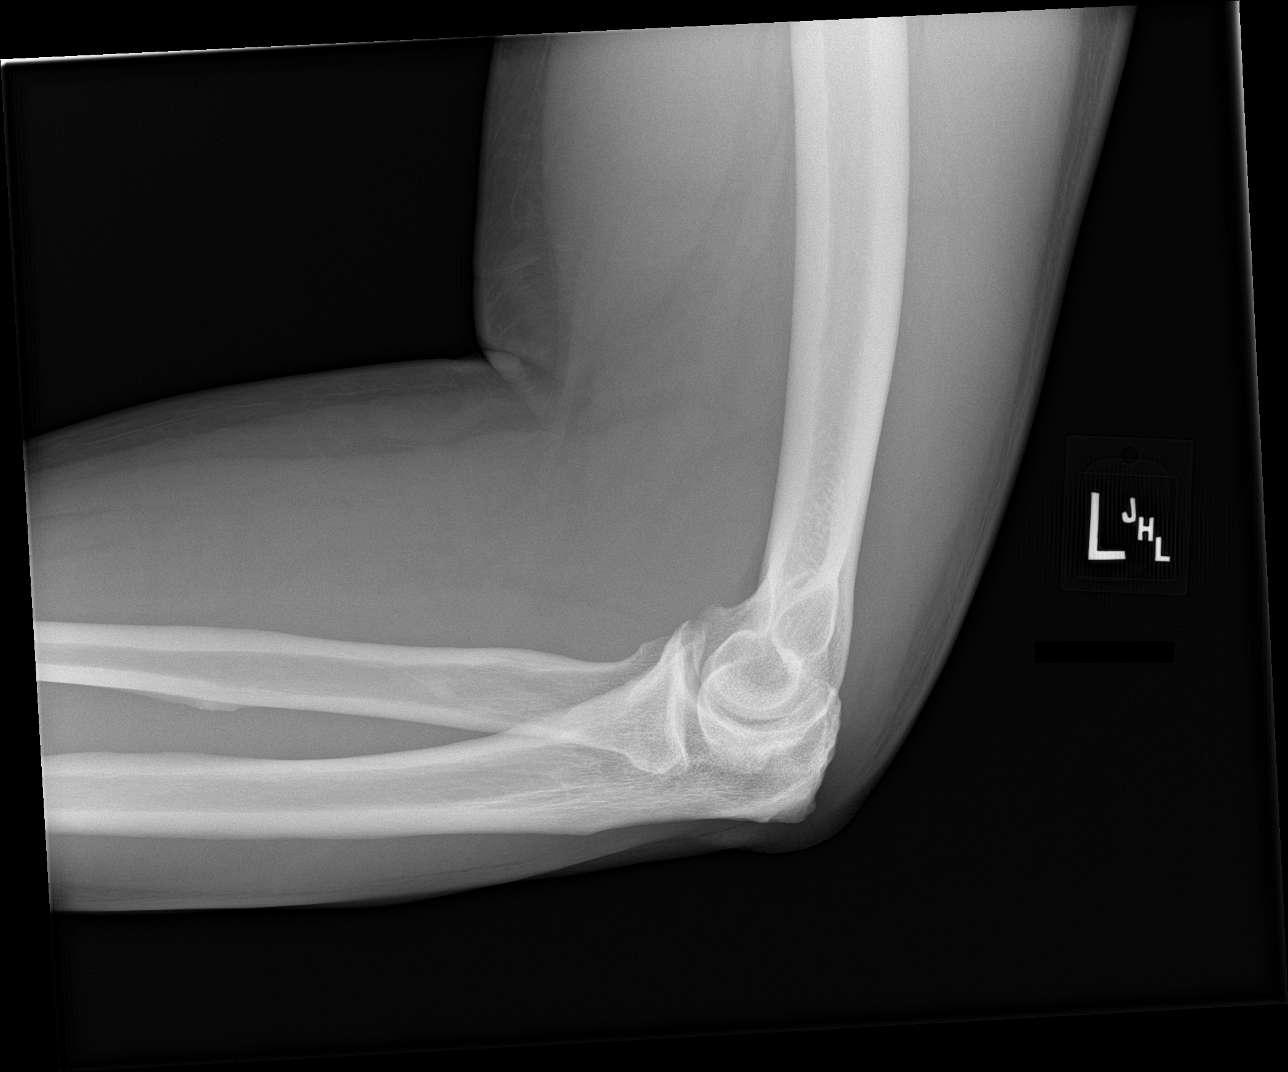

[elbow obl (1 of 2)]
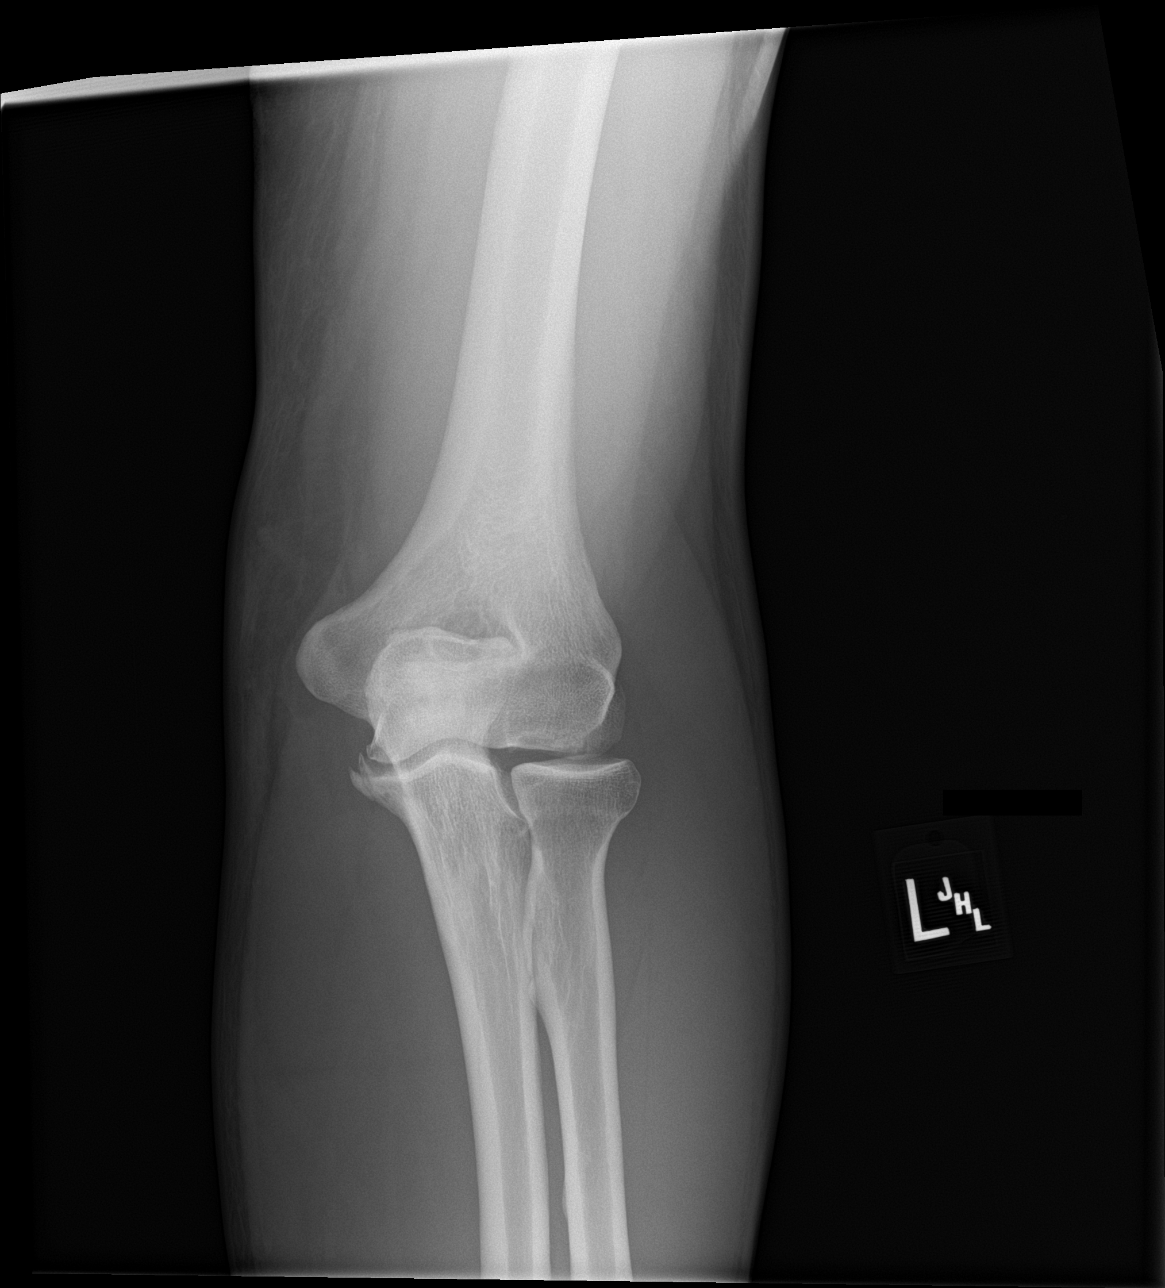

[elbow obl (2 of 2)]
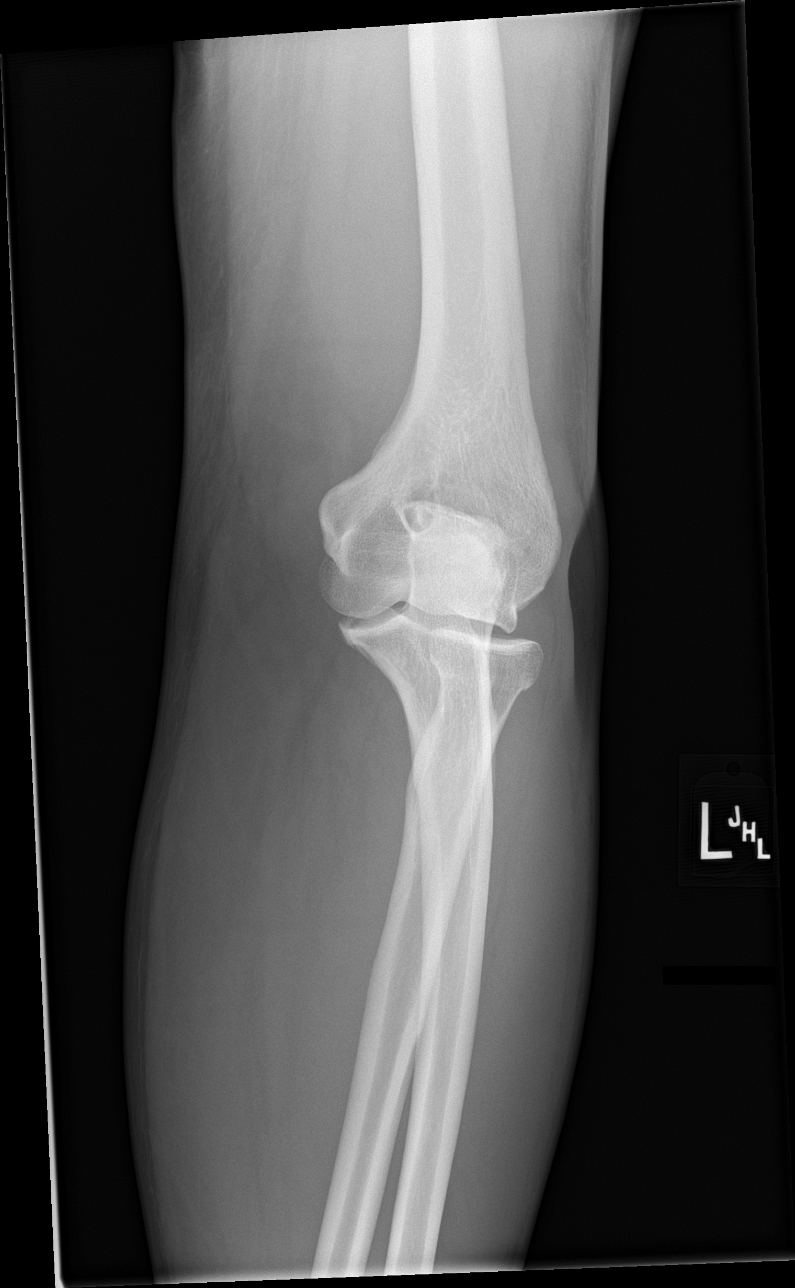

[4 of 4 positions shown; findings below may reference images not displayed]

FINDINGS: No fracture or malalignment. Mild degenerative changes on the ulnar
side of the elbow. No significant elbow effusion
IMPRESSION: No acute osseous abnormality

## 2021-06-21 NOTE — ED Triage Notes (Signed)
Patient c/o R flank pain with fever x2 days. Denies urinary symptoms. Also c/o L arm pain fingers to elbow with swelling since yesterday. Hx gout. Recent partial L foot amputation last week.

## 2021-06-21 NOTE — ED Provider Notes (Signed)
[facility] DEPT Provider Note   CSN: [id number] Arrival date & time: 06/21/21  2022     History {Add pertinent medical, surgical, social history, OB history to HPI:1} Chief Complaint  Patient presents with   Flank Pain    Barry [name] is a 46 y.o. male with history of diabetes with recent left foot fifth digit amputation, CKD stage III, dyslipidemia, Hypothyroidism who presents with concern for subjective fevers for the last 2 days, right side pain without urinary symptoms, and left arm redness, pain, and swelling.  New dressing placed 48 hours ago with scant purulent drainage.  Following with wound care.  On Augmentin and doxycycline outpatient for infected diabetic wound.  In addition to the above listed history patient had abscess of the left hand, degenerative disc disease of the L-spine, and dyslipidemia.  I have personally reviewed this patient's medical records.   HPI     Home Medications Prior to Admission medications   Medication Sig Start Date End Date Taking? Authorizing Provider  Accu-Chek Softclix Lancets lancets 1 each 3 (three) times daily. 02/01/21   [provider]  allopurinol (ZYLOPRIM) 300 MG tablet Take 300 mg by mouth daily. 10/22/19   [provider]  amoxicillin-clavulanate (AUGMENTIN) 875-125 MG tablet Take 1 tablet by mouth 2 (two) times daily for 10 days. 06/12/21 06/22/21  Caren Griffins, MD  aspirin 81 MG chewable tablet Chew 81 mg by mouth daily. 05/03/17   [provider]  atorvastatin (LIPITOR) 80 MG tablet Take 80 mg by mouth daily.  11/21/17   [provider]  Blood Glucose Monitoring Suppl (GLUCOCOM BLOOD GLUCOSE MONITOR) DEVI 1 each by Other route 3 (three) times daily. 01/30/21   [provider]  doxycycline (VIBRAMYCIN) 100 MG capsule Take 1 capsule (100 mg total) by mouth 2 (two) times daily for 10 days. One po bid x 7 days 06/12/21 06/22/21  Caren Griffins, MD   furosemide (LASIX) 20 MG tablet Take 20 mg by mouth daily. 01/17/20   [provider]  gentamicin ointment (GARAMYCIN) 0.1 % Apply 1 application. topically daily. For wound care 04/22/21   Landis Martins, DPM  HUMALOG MIX 75/25 (75-25) 100 UNIT/ML SUSP injection Inject 100 Units into the skin 2 (two) times a day. 06/07/18   [provider]  JARDIANCE 25 MG TABS tablet Take 25 mg by mouth daily. 10/18/18   [provider]  levothyroxine (SYNTHROID) 75 MCG tablet Take 75 mcg by mouth daily. 11/20/17   [provider]  losartan (COZAAR) 25 MG tablet Take 25 mg by mouth 2 (two) times daily. 06/14/21   [provider]  naloxone [facility]) nasal spray 4 mg/0.1 mL SMARTSIG:1 Both Nares Daily 06/12/21   [provider]  oxyCODONE-acetaminophen (PERCOCET) 5-325 MG tablet Take 1 tablet by mouth every 6 (six) hours as needed for severe pain. 06/19/21   Edrick Kins, DPM  OZEMPIC, 0.25 OR 0.5 MG/DOSE, 2 MG/3ML SOPN Inject 0.5 mg as directed once a week. fridays 05/21/21   [provider]  pregabalin (LYRICA) 75 MG capsule Take 1 capsule (75 mg total) by mouth 2 (two) times daily. 08/23/19   Stover, Titorya, DPM  RELION INSULIN SYRINGE 1ML/31G 31G X 5/16" 1 ML MISC Inject 1 Syringe into the skin 2 (two) times daily. 01/26/21   [provider]  silver sulfADIAZINE (SILVADENE) 1 % cream Apply pea-sized amount to wound daily. Patient not taking: Reported on 06/21/2021 02/14/21   Evelina Bucy, DPM  Allergies    Patient has no known allergies.    Review of Systems   Review of Systems  Constitutional:  Positive for appetite change, chills, fatigue and fever.  HENT: Negative.    Respiratory: Negative.    Gastrointestinal:  Positive for nausea. Negative for abdominal pain, diarrhea and vomiting.  Genitourinary:  Positive for flank pain. Negative for decreased urine volume, dysuria, penile pain, penile swelling, scrotal swelling and testicular pain.   Musculoskeletal:  Positive for myalgias.  Skin:  Positive for wound.  Neurological: Negative.    Physical Exam Updated Vital Signs BP (!) 179/114   Pulse (!) 113   Temp 99.1 F (37.3 C) (Oral)   Resp (!) 22   SpO2 96%  Physical Exam Vitals and nursing note reviewed.  Constitutional:      Appearance: He is obese. He is ill-appearing. He is not toxic-appearing.  HENT:     Head: Normocephalic and atraumatic.     Nose: Nose normal.     Mouth/Throat:     Mouth: Mucous membranes are moist.     Pharynx: No oropharyngeal exudate or posterior oropharyngeal erythema.  Eyes:     General:        Right eye: No discharge.        Left eye: No discharge.     Extraocular Movements: Extraocular movements intact.     Conjunctiva/sclera: Conjunctivae normal.     Pupils: Pupils are equal, round, and reactive to light.  Cardiovascular:     Rate and Rhythm: Normal rate and regular rhythm.     Pulses: Normal pulses.     Heart sounds: Normal heart sounds. No murmur heard. Pulmonary:     Effort: Pulmonary effort is normal. No respiratory distress.     Breath sounds: Normal breath sounds. No wheezing or rales.  Abdominal:     General: Bowel sounds are normal. There is no distension.     Palpations: Abdomen is soft.     Tenderness: There is no abdominal tenderness. There is right CVA tenderness. There is no left CVA tenderness, guarding or rebound.  Musculoskeletal:        General: No deformity.     Cervical back: Neck supple.     Right lower leg: 1+ Edema present.     Left lower leg: 1+ Edema present.     Comments: Warmth to the touch, TTP from the left hand extending upward toward the elbow, without wound. Normal capillary refill, pain with active and passive ROM.   Skin:    General: Skin is warm and dry.     Capillary Refill: Capillary refill takes less than 2 seconds.     Findings: No rash.     Comments: NO skin changes over the right flank  Neurological:     General: No focal deficit  present.     Mental Status: He is alert and oriented to person, place, and time. Mental status is at baseline.  Psychiatric:        Mood and Affect: Mood normal.    ED Results / Procedures / Treatments   Labs (all labs ordered are listed, but only abnormal results are displayed) Labs Reviewed  LACTIC ACID, PLASMA  LACTIC ACID, PLASMA  COMPREHENSIVE METABOLIC PANEL  CBC WITH DIFFERENTIAL/PLATELET  URINALYSIS, ROUTINE W REFLEX MICROSCOPIC    EKG None  Radiology No results found.  Procedures Procedures  {Document cardiac monitor, telemetry assessment procedure when appropriate:1}  Medications Ordered in ED Medications - No data to display  ED Course/ Medical Decision Making/ A&P                           Medical Decision Making Amount and/or Complexity of Data Reviewed Labs: ordered.   ***  {Document critical care time when appropriate:1} {Document review of labs and clinical decision tools ie heart score, Chads2Vasc2 etc:1}  {Document your independent review of radiology images, and any outside records:1} {Document your discussion with family members, caretakers, and with consultants:1} {Document social determinants of health affecting pt's care:1} {Document your decision making why or why not admission, treatments were needed:1} Final Clinical Impression(s) / ED Diagnoses Final diagnoses:  None    Rx / DC Orders ED Discharge Orders     None

## 2021-06-22 ENCOUNTER — Inpatient Hospital Stay (HOSPITAL_COMMUNITY): Payer: BC Managed Care – PPO

## 2021-06-22 ENCOUNTER — Encounter (HOSPITAL_COMMUNITY): Payer: Self-pay | Admitting: Internal Medicine

## 2021-06-22 ENCOUNTER — Other Ambulatory Visit: Payer: Self-pay

## 2021-06-22 ENCOUNTER — Emergency Department (HOSPITAL_COMMUNITY): Payer: BC Managed Care – PPO

## 2021-06-22 DIAGNOSIS — M109 Gout, unspecified: Secondary | ICD-10-CM | POA: Diagnosis present

## 2021-06-22 DIAGNOSIS — Z79899 Other long term (current) drug therapy: Secondary | ICD-10-CM | POA: Diagnosis not present

## 2021-06-22 DIAGNOSIS — T8141XA Infection following a procedure, superficial incisional surgical site, initial encounter: Secondary | ICD-10-CM | POA: Diagnosis present

## 2021-06-22 DIAGNOSIS — E872 Acidosis, unspecified: Secondary | ICD-10-CM | POA: Diagnosis present

## 2021-06-22 DIAGNOSIS — E1169 Type 2 diabetes mellitus with other specified complication: Secondary | ICD-10-CM | POA: Diagnosis present

## 2021-06-22 DIAGNOSIS — M10032 Idiopathic gout, left wrist: Secondary | ICD-10-CM | POA: Diagnosis present

## 2021-06-22 DIAGNOSIS — N1831 Chronic kidney disease, stage 3a: Secondary | ICD-10-CM | POA: Diagnosis not present

## 2021-06-22 DIAGNOSIS — Z794 Long term (current) use of insulin: Secondary | ICD-10-CM

## 2021-06-22 DIAGNOSIS — E1165 Type 2 diabetes mellitus with hyperglycemia: Secondary | ICD-10-CM | POA: Diagnosis present

## 2021-06-22 DIAGNOSIS — A419 Sepsis, unspecified organism: Secondary | ICD-10-CM

## 2021-06-22 DIAGNOSIS — N1832 Chronic kidney disease, stage 3b: Secondary | ICD-10-CM | POA: Diagnosis present

## 2021-06-22 DIAGNOSIS — E7800 Pure hypercholesterolemia, unspecified: Secondary | ICD-10-CM | POA: Diagnosis present

## 2021-06-22 DIAGNOSIS — E039 Hypothyroidism, unspecified: Secondary | ICD-10-CM | POA: Diagnosis present

## 2021-06-22 DIAGNOSIS — M1000 Idiopathic gout, unspecified site: Secondary | ICD-10-CM | POA: Diagnosis not present

## 2021-06-22 DIAGNOSIS — T8144XA Sepsis following a procedure, initial encounter: Secondary | ICD-10-CM | POA: Diagnosis present

## 2021-06-22 DIAGNOSIS — E1122 Type 2 diabetes mellitus with diabetic chronic kidney disease: Secondary | ICD-10-CM | POA: Diagnosis present

## 2021-06-22 DIAGNOSIS — E1100 Type 2 diabetes mellitus with hyperosmolarity without nonketotic hyperglycemic-hyperosmolar coma (NKHHC): Secondary | ICD-10-CM | POA: Diagnosis not present

## 2021-06-22 DIAGNOSIS — Y835 Amputation of limb(s) as the cause of abnormal reaction of the patient, or of later complication, without mention of misadventure at the time of the procedure: Secondary | ICD-10-CM | POA: Diagnosis present

## 2021-06-22 DIAGNOSIS — E785 Hyperlipidemia, unspecified: Secondary | ICD-10-CM

## 2021-06-22 DIAGNOSIS — E119 Type 2 diabetes mellitus without complications: Secondary | ICD-10-CM | POA: Diagnosis not present

## 2021-06-22 DIAGNOSIS — D649 Anemia, unspecified: Secondary | ICD-10-CM | POA: Diagnosis present

## 2021-06-22 DIAGNOSIS — L03116 Cellulitis of left lower limb: Secondary | ICD-10-CM | POA: Diagnosis present

## 2021-06-22 DIAGNOSIS — Z7984 Long term (current) use of oral hypoglycemic drugs: Secondary | ICD-10-CM | POA: Diagnosis not present

## 2021-06-22 DIAGNOSIS — Z6841 Body Mass Index (BMI) 40.0 and over, adult: Secondary | ICD-10-CM | POA: Diagnosis not present

## 2021-06-22 DIAGNOSIS — I129 Hypertensive chronic kidney disease with stage 1 through stage 4 chronic kidney disease, or unspecified chronic kidney disease: Secondary | ICD-10-CM | POA: Diagnosis present

## 2021-06-22 DIAGNOSIS — E1142 Type 2 diabetes mellitus with diabetic polyneuropathy: Secondary | ICD-10-CM | POA: Diagnosis present

## 2021-06-22 DIAGNOSIS — M869 Osteomyelitis, unspecified: Secondary | ICD-10-CM | POA: Diagnosis present

## 2021-06-22 DIAGNOSIS — E8809 Other disorders of plasma-protein metabolism, not elsewhere classified: Secondary | ICD-10-CM | POA: Diagnosis present

## 2021-06-22 LAB — PROTIME-INR
INR: 1 (ref 0.8–1.2)
Prothrombin Time: 13.3 seconds (ref 11.4–15.2)

## 2021-06-22 LAB — GLUCOSE, CAPILLARY
Glucose-Capillary: 166 mg/dL — ABNORMAL HIGH (ref 70–99)
Glucose-Capillary: 173 mg/dL — ABNORMAL HIGH (ref 70–99)

## 2021-06-22 LAB — CBC WITH DIFFERENTIAL/PLATELET
Abs Immature Granulocytes: 0.1 10*3/uL — ABNORMAL HIGH (ref 0.00–0.07)
Abs Immature Granulocytes: 0.05 10*3/uL (ref 0.00–0.07)
Basophils Absolute: 0 10*3/uL (ref 0.0–0.1)
Basophils Absolute: 0 10*3/uL (ref 0.0–0.1)
Basophils Relative: 0 %
Basophils Relative: 0 %
Eosinophils Absolute: 0.3 10*3/uL (ref 0.0–0.5)
Eosinophils Absolute: 0.3 10*3/uL (ref 0.0–0.5)
Eosinophils Relative: 3 %
Eosinophils Relative: 2 %
HCT: 35.9 % — ABNORMAL LOW (ref 39.0–52.0)
HCT: 34.2 % — ABNORMAL LOW (ref 39.0–52.0)
Hemoglobin: 12.1 g/dL — ABNORMAL LOW (ref 13.0–17.0)
Hemoglobin: 11.3 g/dL — ABNORMAL LOW (ref 13.0–17.0)
Immature Granulocytes: 1 %
Immature Granulocytes: 0 %
Lymphocytes Relative: 17 %
Lymphocytes Relative: 17 %
Lymphs Abs: 2.1 10*3/uL (ref 0.7–4.0)
Lymphs Abs: 2.1 10*3/uL (ref 0.7–4.0)
MCH: 30.7 pg (ref 26.0–34.0)
MCH: 30.2 pg (ref 26.0–34.0)
MCHC: 33.7 g/dL (ref 30.0–36.0)
MCHC: 33 g/dL (ref 30.0–36.0)
MCV: 91.1 fL (ref 80.0–100.0)
MCV: 91.4 fL (ref 80.0–100.0)
Monocytes Absolute: 1.3 10*3/uL — ABNORMAL HIGH (ref 0.1–1.0)
Monocytes Absolute: 1.6 10*3/uL — ABNORMAL HIGH (ref 0.1–1.0)
Monocytes Relative: 11 %
Monocytes Relative: 13 %
Neutro Abs: 8.2 10*3/uL — ABNORMAL HIGH (ref 1.7–7.7)
Neutro Abs: 8.5 10*3/uL — ABNORMAL HIGH (ref 1.7–7.7)
Neutrophils Relative %: 68 %
Neutrophils Relative %: 68 %
Platelets: 344 10*3/uL (ref 150–400)
Platelets: 324 10*3/uL (ref 150–400)
RBC: 3.94 MIL/uL — ABNORMAL LOW (ref 4.22–5.81)
RBC: 3.74 MIL/uL — ABNORMAL LOW (ref 4.22–5.81)
RDW: 13.5 % (ref 11.5–15.5)
RDW: 13.3 % (ref 11.5–15.5)
WBC: 12.1 10*3/uL — ABNORMAL HIGH (ref 4.0–10.5)
WBC: 12.6 10*3/uL — ABNORMAL HIGH (ref 4.0–10.5)
nRBC: 0 % (ref 0.0–0.2)
nRBC: 0 % (ref 0.0–0.2)

## 2021-06-22 LAB — SEDIMENTATION RATE: Sed Rate: 149 mm/hr — ABNORMAL HIGH (ref 0–16)

## 2021-06-22 LAB — APTT: aPTT: 32 seconds (ref 24–36)

## 2021-06-22 LAB — COMPREHENSIVE METABOLIC PANEL
ALT: 15 U/L (ref 0–44)
ALT: 14 U/L (ref 0–44)
AST: 13 U/L — ABNORMAL LOW (ref 15–41)
AST: 12 U/L — ABNORMAL LOW (ref 15–41)
Albumin: 3 g/dL — ABNORMAL LOW (ref 3.5–5.0)
Albumin: 2.8 g/dL — ABNORMAL LOW (ref 3.5–5.0)
Alkaline Phosphatase: 53 U/L (ref 38–126)
Alkaline Phosphatase: 50 U/L (ref 38–126)
Anion gap: 7 (ref 5–15)
Anion gap: 9 (ref 5–15)
BUN: 26 mg/dL — ABNORMAL HIGH (ref 6–20)
BUN: 26 mg/dL — ABNORMAL HIGH (ref 6–20)
CO2: 24 mmol/L (ref 22–32)
CO2: 22 mmol/L (ref 22–32)
Calcium: 8.6 mg/dL — ABNORMAL LOW (ref 8.9–10.3)
Calcium: 8.4 mg/dL — ABNORMAL LOW (ref 8.9–10.3)
Chloride: 105 mmol/L (ref 98–111)
Chloride: 103 mmol/L (ref 98–111)
Creatinine, Ser: 1.83 mg/dL — ABNORMAL HIGH (ref 0.61–1.24)
Creatinine, Ser: 1.67 mg/dL — ABNORMAL HIGH (ref 0.61–1.24)
GFR, Estimated: 46 mL/min — ABNORMAL LOW (ref >60)
GFR, Estimated: 52 mL/min — ABNORMAL LOW (ref >60)
Glucose, Bld: 134 mg/dL — ABNORMAL HIGH (ref 70–99)
Glucose, Bld: 147 mg/dL — ABNORMAL HIGH (ref 70–99)
Potassium: 4.2 mmol/L (ref 3.5–5.1)
Potassium: 4.4 mmol/L (ref 3.5–5.1)
Sodium: 136 mmol/L (ref 135–145)
Sodium: 134 mmol/L — ABNORMAL LOW (ref 135–145)
Total Bilirubin: 0.8 mg/dL (ref 0.3–1.2)
Total Bilirubin: 0.8 mg/dL (ref 0.3–1.2)
Total Protein: 7.9 g/dL (ref 6.5–8.1)
Total Protein: 7.2 g/dL (ref 6.5–8.1)

## 2021-06-22 LAB — CBG MONITORING, ED: Glucose-Capillary: 168 mg/dL — ABNORMAL HIGH (ref 70–99)

## 2021-06-22 LAB — LACTIC ACID, PLASMA
Lactic Acid, Venous: 1.1 mmol/L (ref 0.5–1.9)
Lactic Acid, Venous: 0.8 mmol/L (ref 0.5–1.9)

## 2021-06-22 LAB — MAGNESIUM: Magnesium: 1.8 mg/dL (ref 1.7–2.4)

## 2021-06-22 LAB — MRSA NEXT GEN BY PCR, NASAL: MRSA by PCR Next Gen: NOT DETECTED

## 2021-06-22 IMAGING — DX DG FOOT COMPLETE 3+V*R*
3 series · 3 of 3 positions shown · non-contrast
Comparison: [DATE]

CLINICAL DATA: History of diabetes.  Swelling of the foot.

EXAM:
RIGHT FOOT COMPLETE - 3+ VIEW

[foot ap]
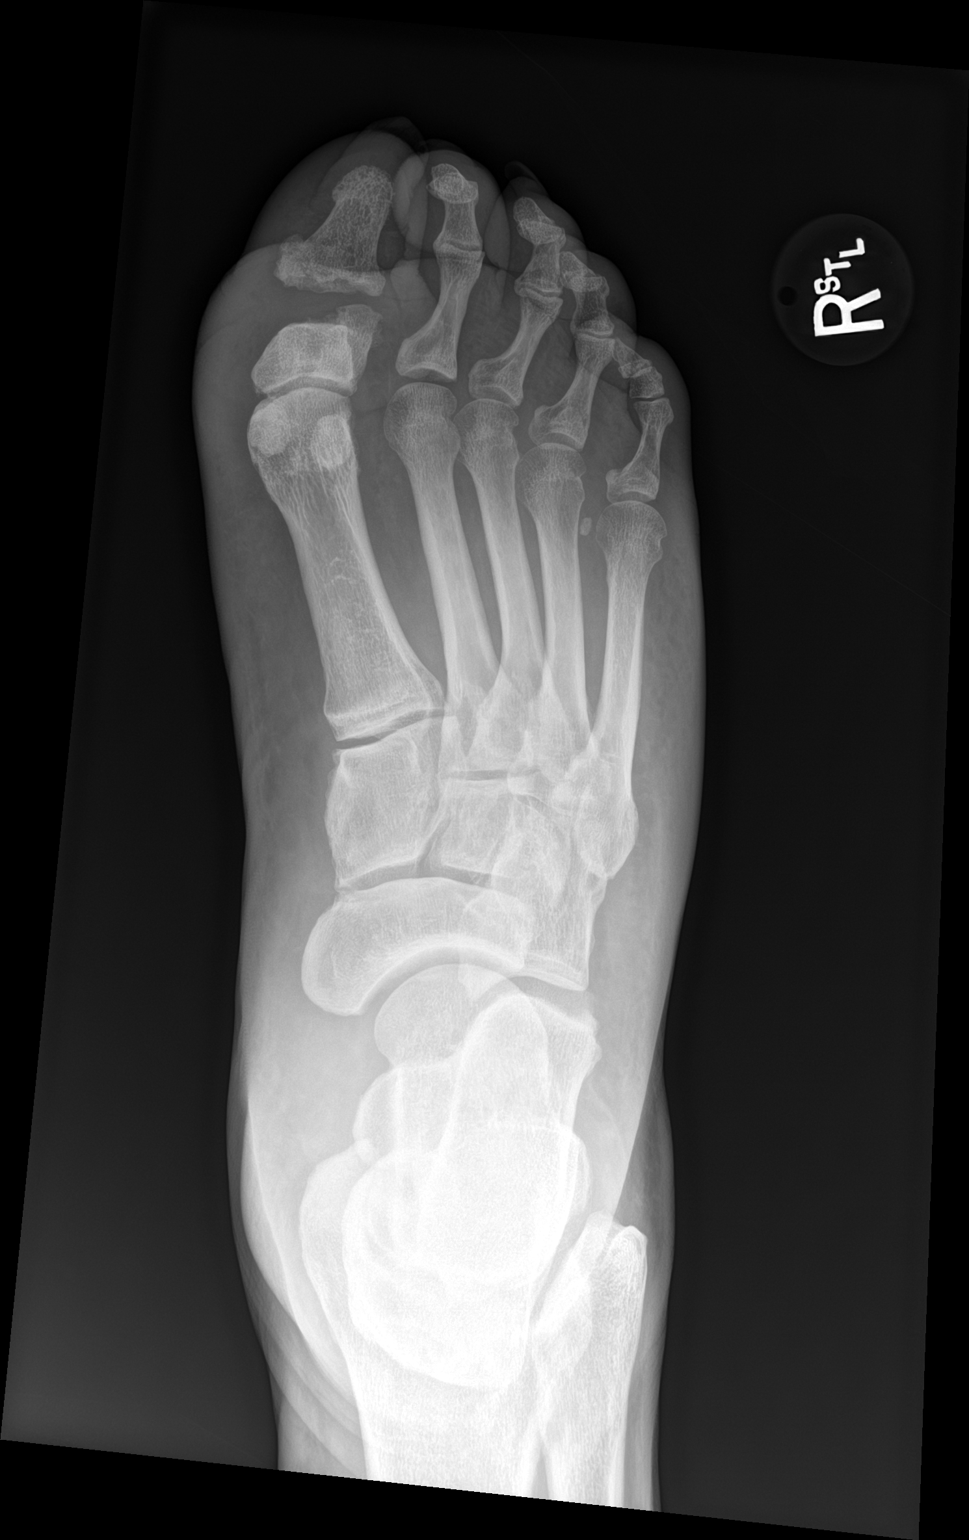

[foot obl]
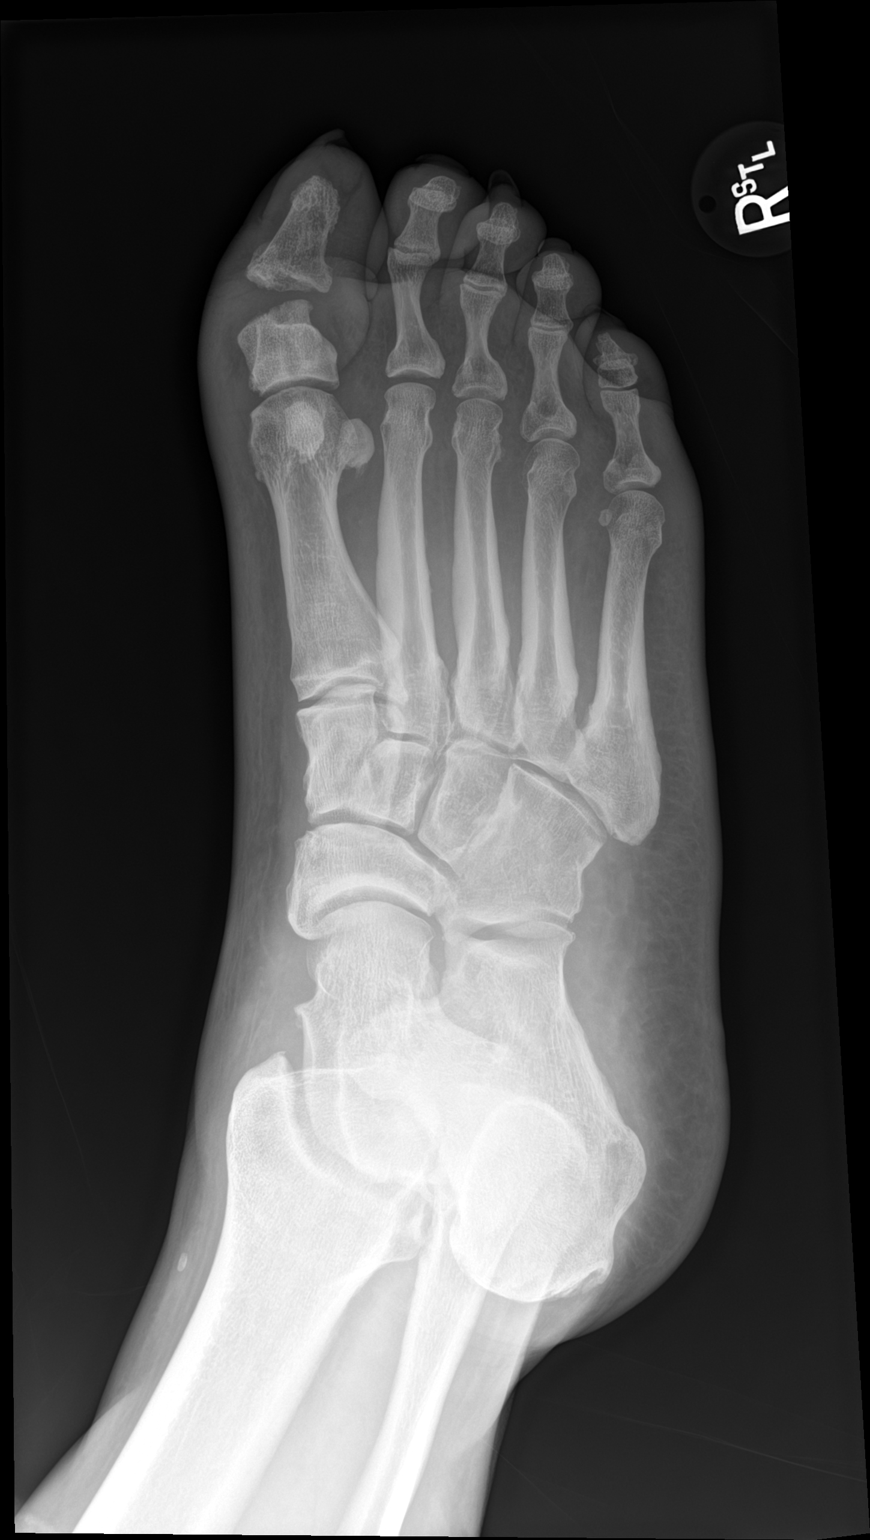

[foot lat]
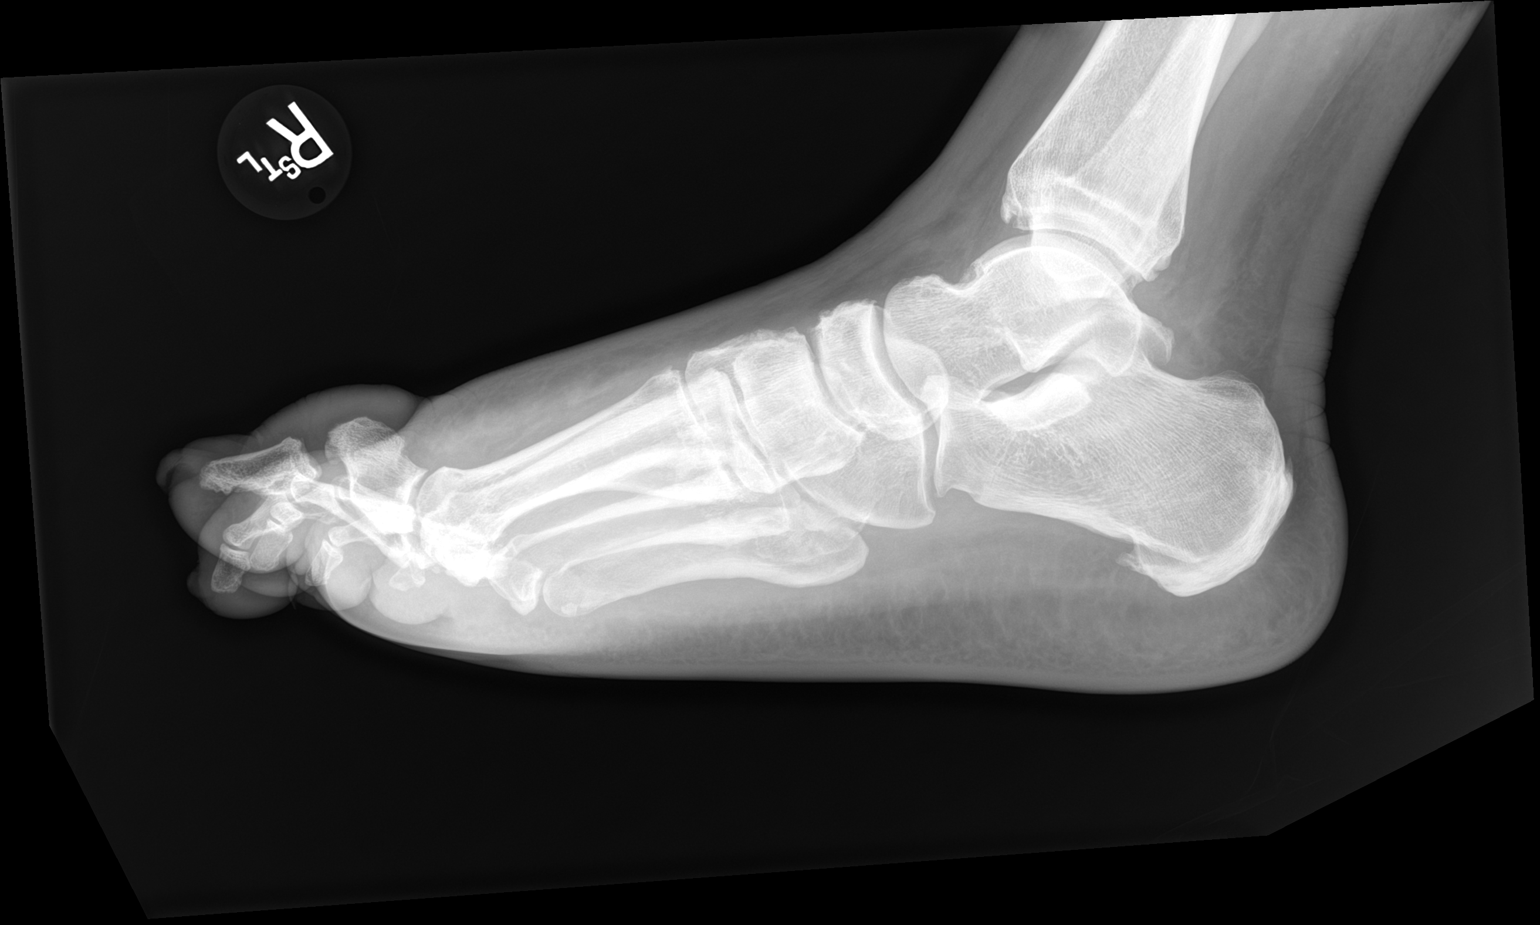

[3 of 3 positions shown; findings below may reference images not displayed]

FINDINGS: There is been segmental amputation of the great toe. Distal phalanx
and proximal aspect of the proximal phalanx remain. There are no
destructive changes to suggest interval osteomyelitis. Regional soft
tissue swelling is present however. No other finding.
IMPRESSION: Partial amputation of the great toe as above. Regional soft tissue
swelling. No additional bone destruction or irregularity to suggest
ongoing osteomyelitis by radiography.

## 2021-06-22 IMAGING — MR MR FOOT*L* W/O CM
5 series · 38 of 40 positions shown · non-contrast
Comparison: X-ray [DATE], MRI [DATE]

CLINICAL DATA: Foot pain.  Fifth ray resection on [DATE]

EXAM:
MRI OF THE LEFT FOOT WITHOUT CONTRAST
TECHNIQUE: Multiplanar, multisequence MR imaging of the left forefoot was
performed. No intravenous contrast was administered.

[Series 3: T1 · coronal · left · 3.0mm · 0.47mm/px · 9 of 34 slices shown (1 of 2)]
[im 1/34]
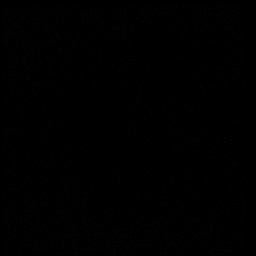
[im 5/34]
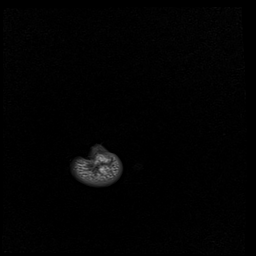
[im 9/34]
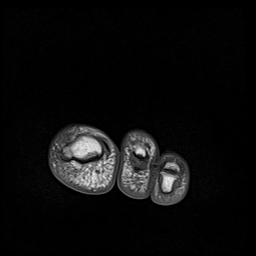
[im 13/34]
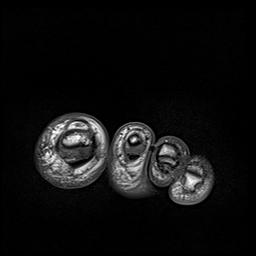
[im 17/34]
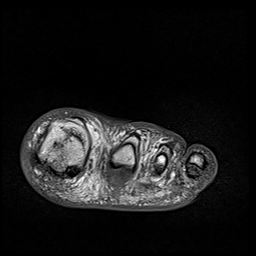
[im 21/34]
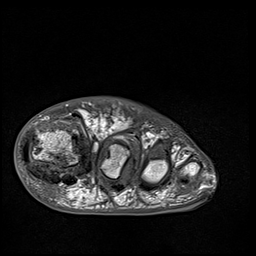
[im 25/34]
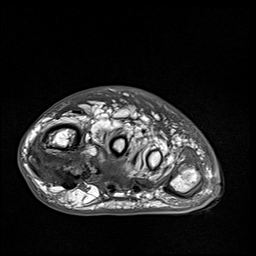
[im 29/34]
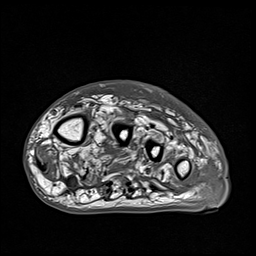
[im 34/34]
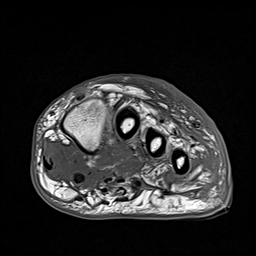

[Series 4: T2 fat-sat · coronal · left · 3.0mm · 0.38mm/px · 9 of 35 slices shown (1 of 2)]
[im 1/35]
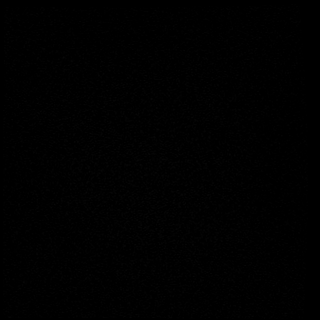
[im 4/35]
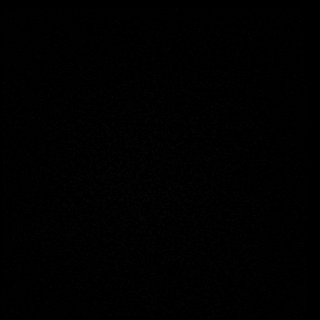
[im 8/35]
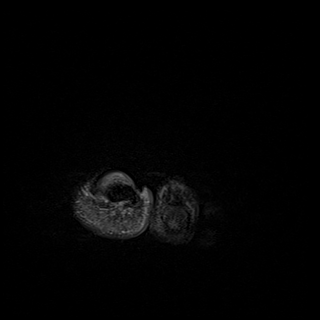
[im 12/35]
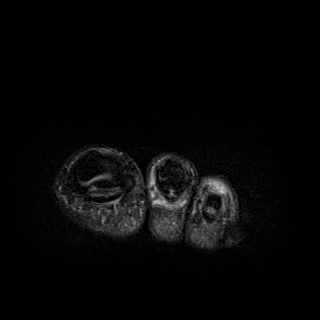
[im 16/35]
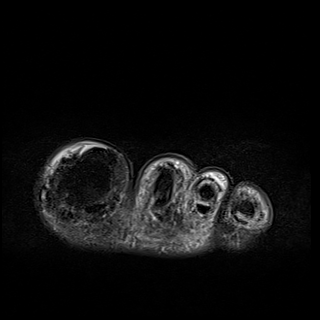
[im 19/35]
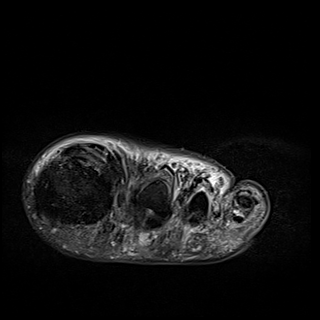
[im 23/35]
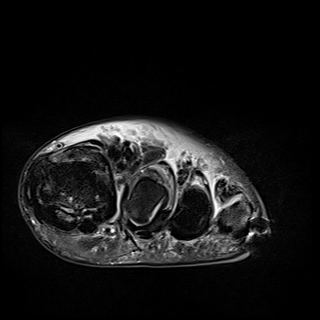
[im 31/35]
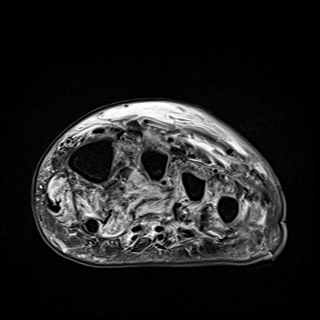
[im 35/35]
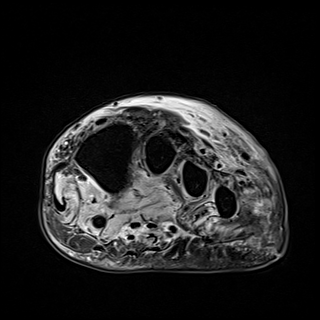

[Series 5: T2 fat-sat · axial · left · 3.0mm · 0.70mm/px · z∈[-45,+27]mm · 6 of 22 slices shown (2 of 2)]
[im 1/22]
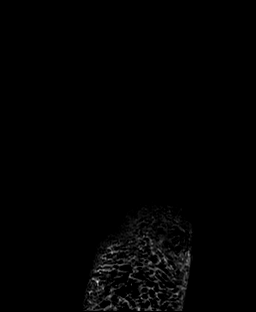
[im 5/22]
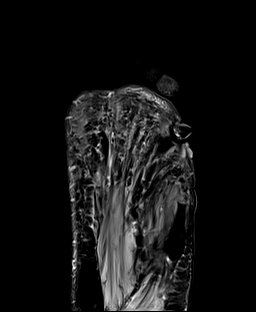
[im 9/22]
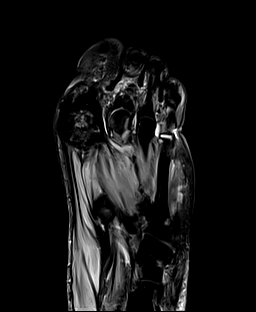
[im 13/22]
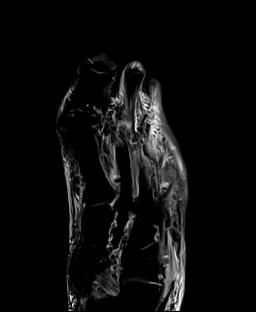
[im 17/22]
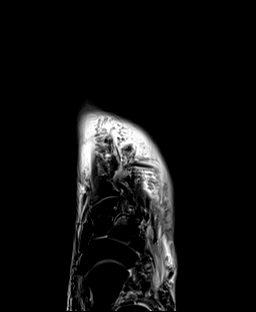
[im 22/22]
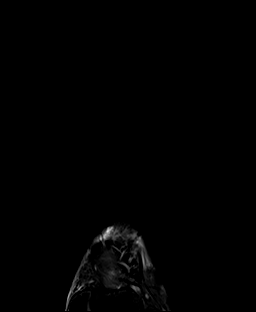

[Series 6: T1 · axial · left · 3.0mm · 0.70mm/px · z∈[-45,+27]mm · 6 of 22 slices shown (2 of 2)]
[im 1/22]
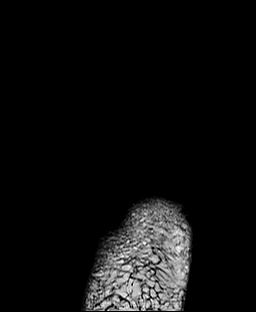
[im 5/22]
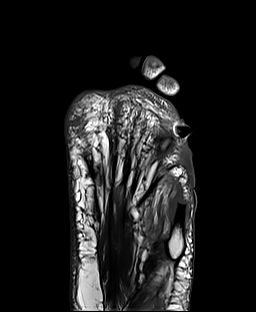
[im 9/22]
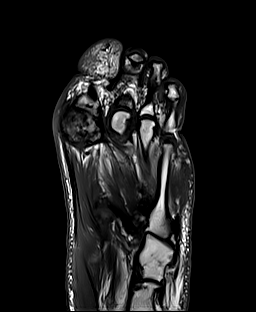
[im 13/22]
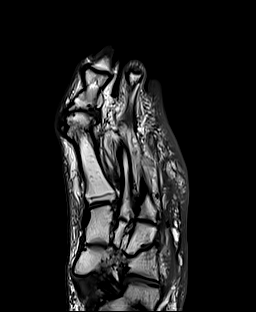
[im 17/22]
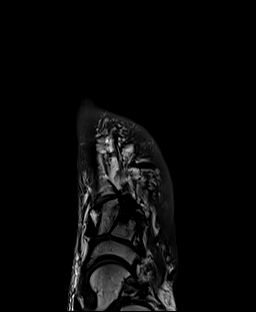
[im 22/22]
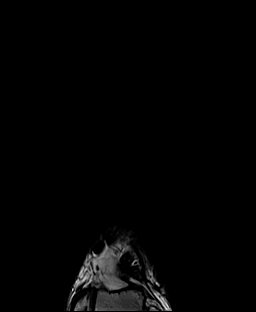

[Series 7: STIR · sagittal · left · 3.0mm · 0.35mm/px · 8 of 34 slices shown]
[im 1/34]
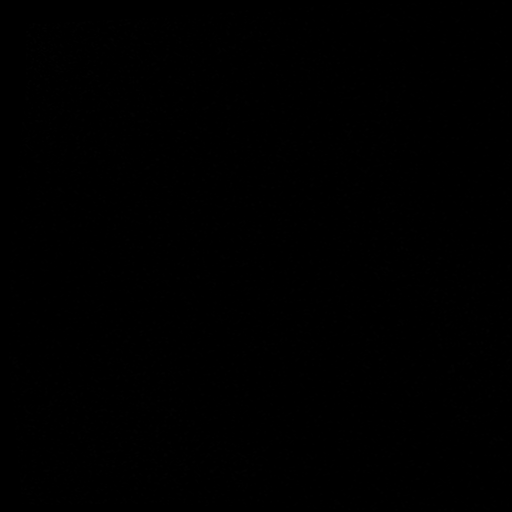
[im 5/34]
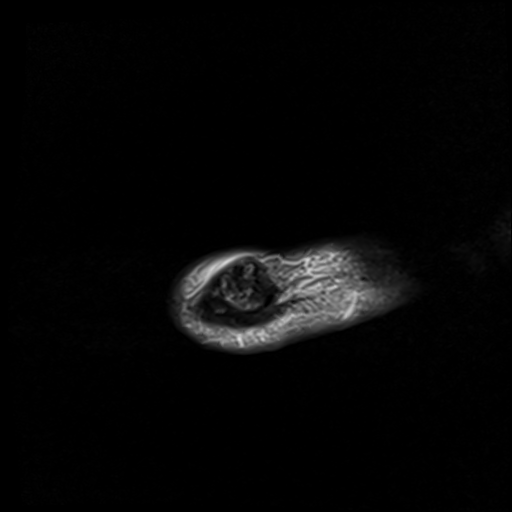
[im 9/34]
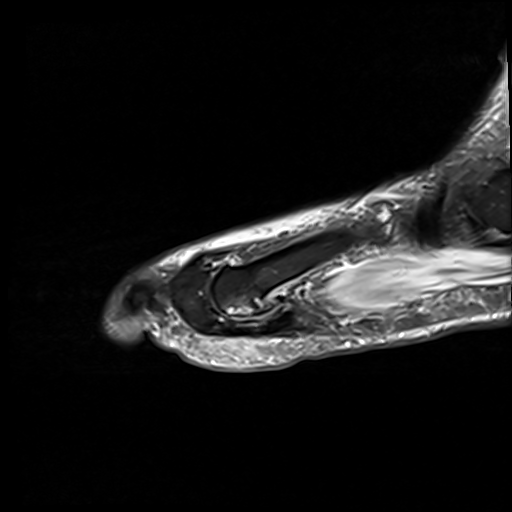
[im 13/34]
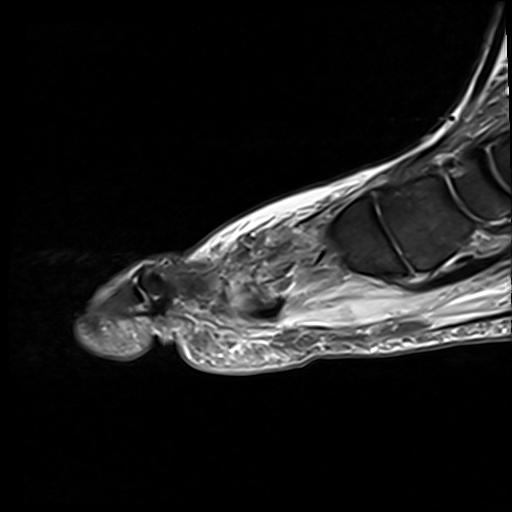
[im 21/34]
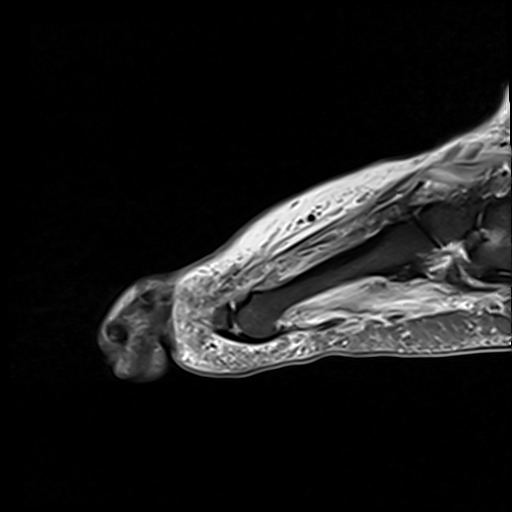
[im 25/34]
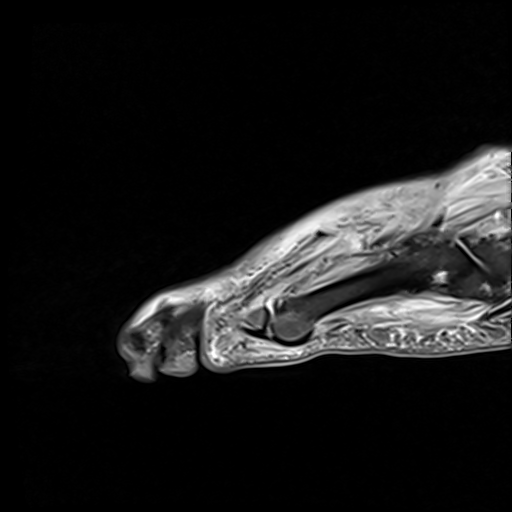
[im 29/34]
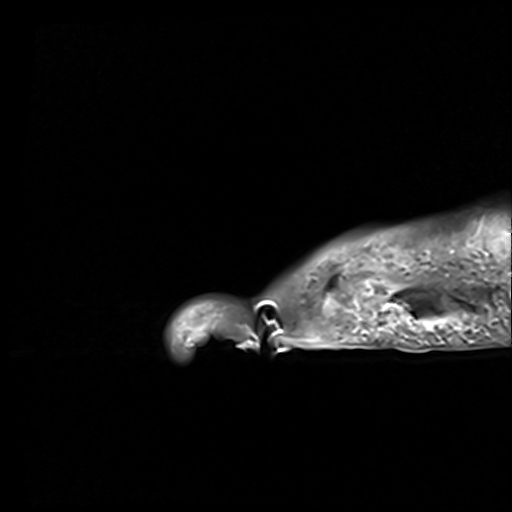
[im 34/34]
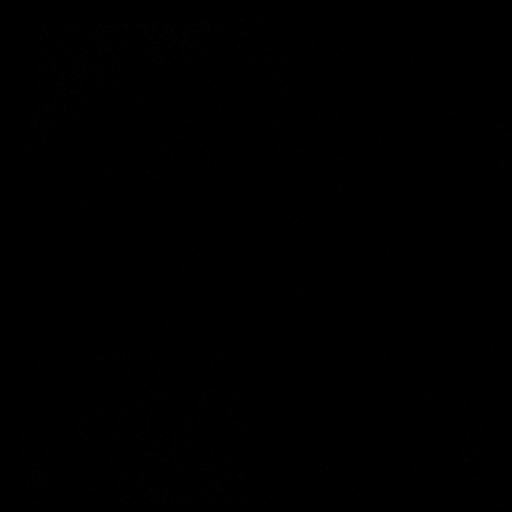

[38 of 40 positions shown; findings below may reference images not displayed]

FINDINGS: Bones/Joint/Cartilage

Interval transmetatarsal amputation of the fifth ray. Mild bone
marrow edema within the residual fifth metatarsal with preserved T1
bone marrow signal and no evidence of erosive change along the
resection margin. There is also mild bone marrow edema along the
base of the fourth toe proximal phalanx as well as faint marrow
edema within the fourth metatarsal head. Preserved T1 bone marrow
signal at these locations.

Similar degree of degenerative changes of the forefoot with moderate
to severe first MTP joint arthropathy. No acute fracture or
dislocation.

Ligaments

Intact Lisfranc ligament. No evidence of acute collateral ligament
injury.

Muscles and Tendons

Denervation changes of the foot musculature with amputation changes
along the lateral forefoot. No significant tenosynovial fluid
collection.

Soft tissues

Generalized soft tissue swelling.  No organized fluid collection.
IMPRESSION: 1. Interval transmetatarsal amputation of the fifth ray. Mild bone
marrow edema within the residual fifth metatarsal with preserved T1
bone marrow signal and no evidence of erosive change along the
resection margin. Appearance is favored to reflect postsurgical
change, although early changes of acute osteomyelitis would be
difficult to exclude by imaging.
2. There is also mild bone marrow edema along the base of the fourth
toe proximal phalanx as well as faint marrow edema within the fourth
metatarsal head. Preserved T1 bone marrow signal at these locations.
Findings are may represent reactive osteitis versus stress related
changes.

## 2021-06-22 IMAGING — CT CT RENAL STONE PROTOCOL
2 of 4 series · 17 of 46 positions shown, 19 images · non-contrast
Comparison: Renal ultrasound [DATE], CT [DATE]

CLINICAL DATA: Right flank pain



[Series 2: axial st · axial · 0.92mm/px · z∈[+1106,+1556]mm · 14 of 104 slices shown, 16 images]
[im 7/104  soft-tissue]
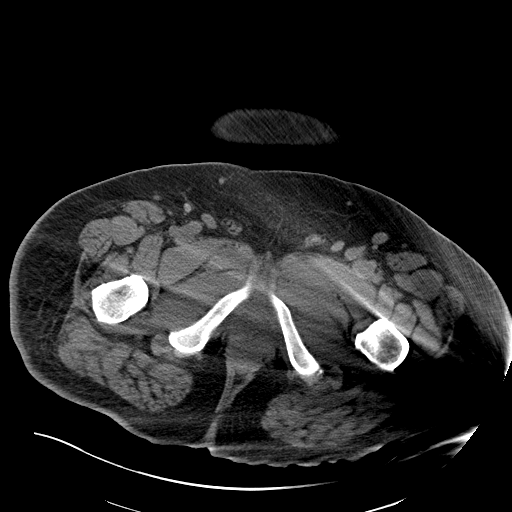
[im 7/104  bone]
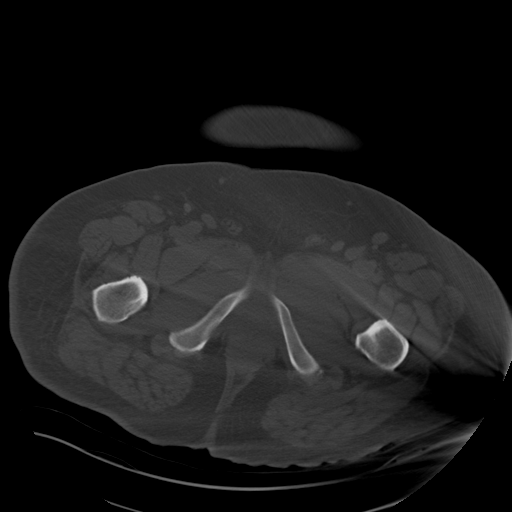
[im 13/104  soft-tissue]
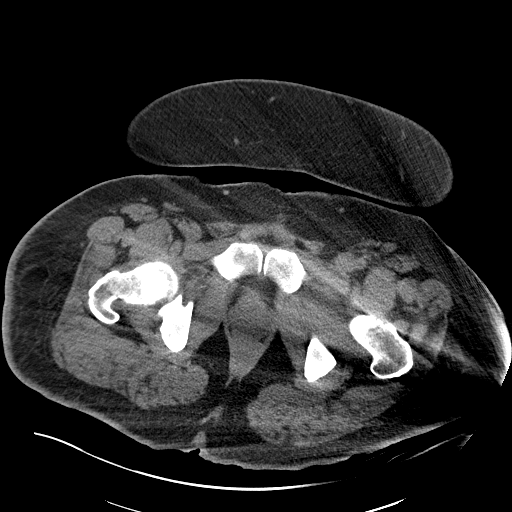
[im 19/104  soft-tissue]
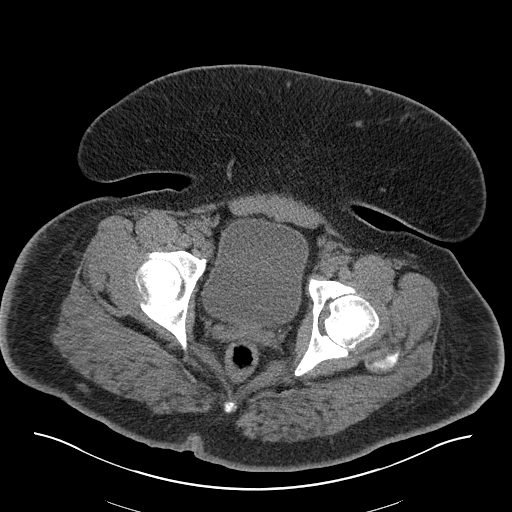
[im 31/104  soft-tissue]
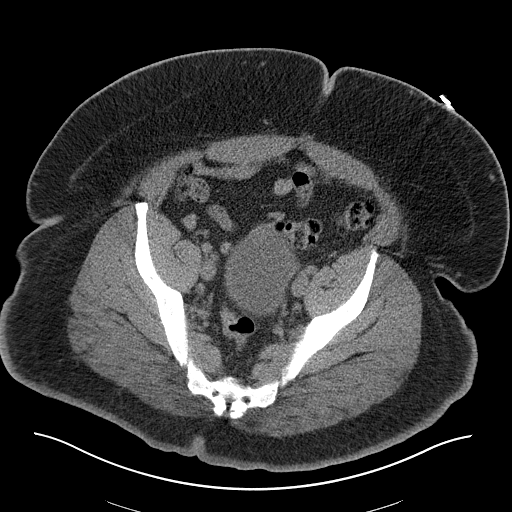
[im 37/104  soft-tissue]
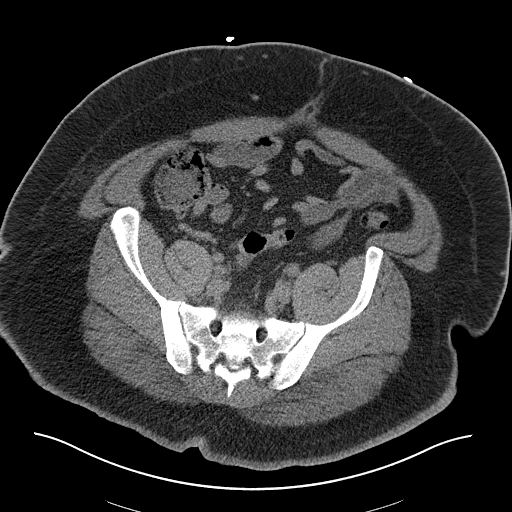
[im 43/104  soft-tissue]
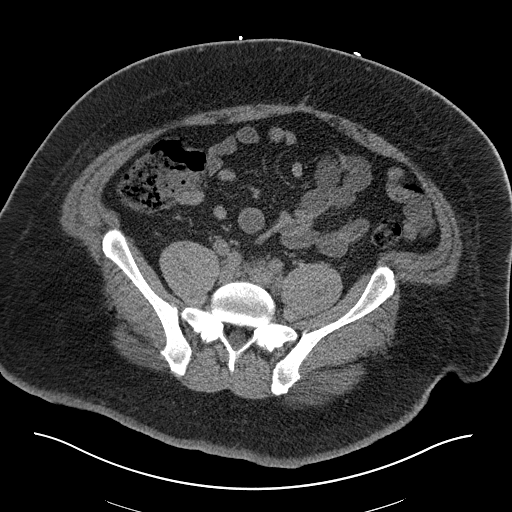
[im 49/104  soft-tissue]
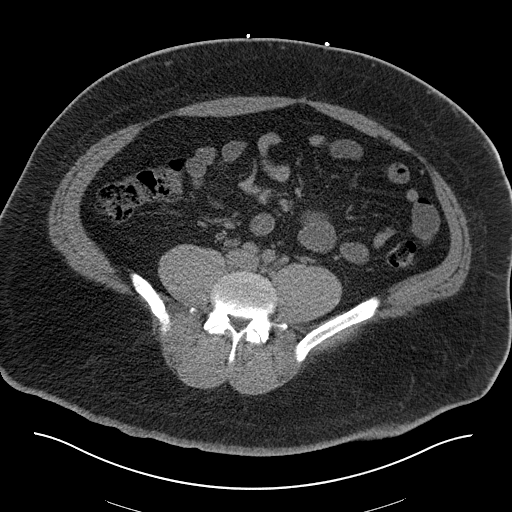
[im 55/104  soft-tissue]
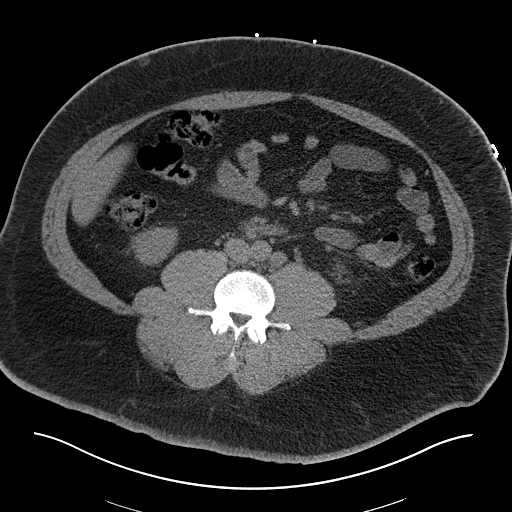
[im 61/104  soft-tissue]
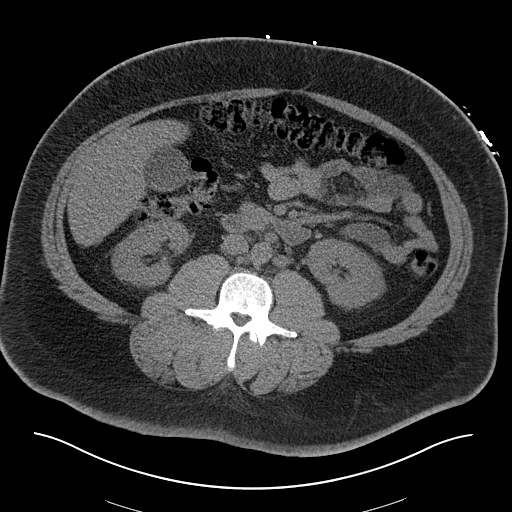
[im 61/104  bone]
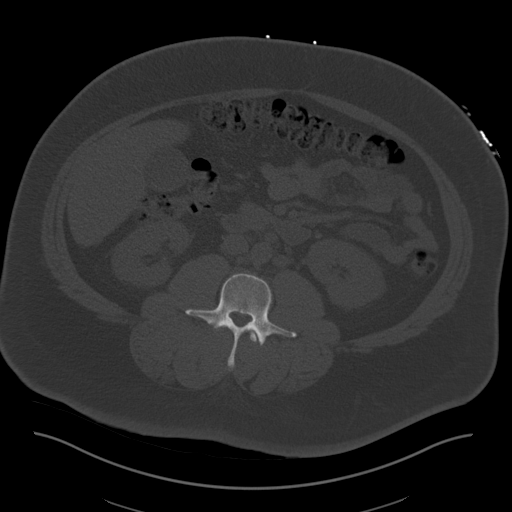
[im 67/104  soft-tissue]
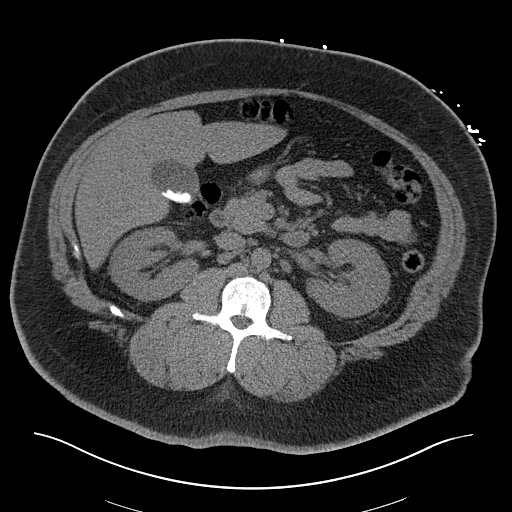
[im 79/104  soft-tissue]
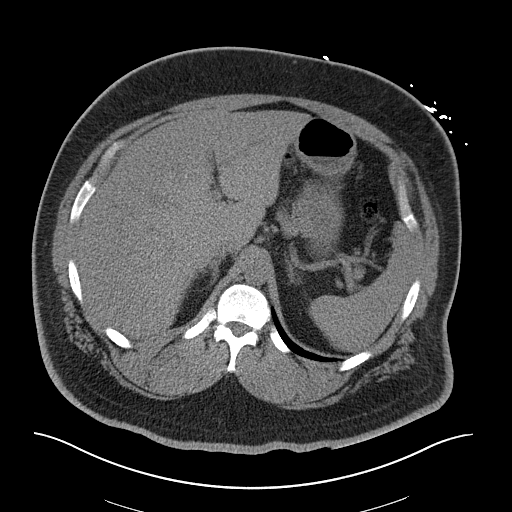
[im 85/104  soft-tissue]
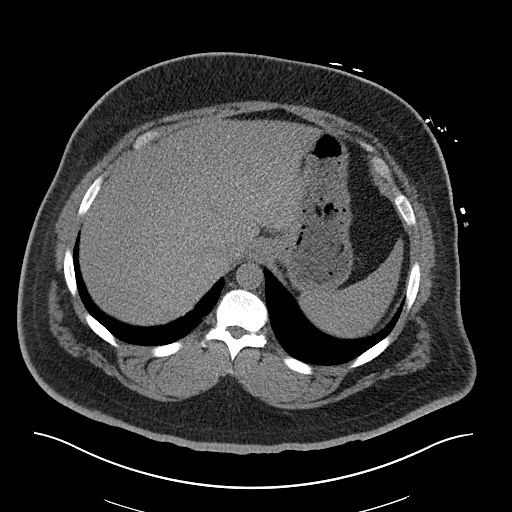
[im 91/104  soft-tissue]
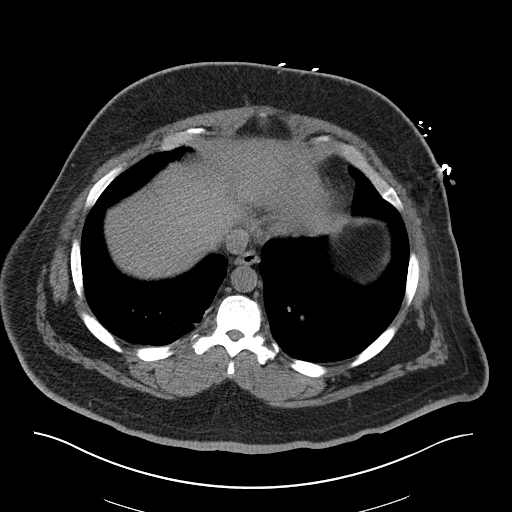
[im 97/104  soft-tissue]
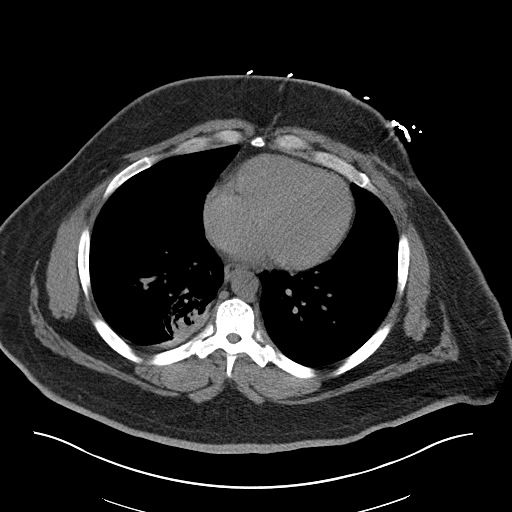

[Series 5: coronal · coronal · 0.98mm/px · 3 of 201 slices shown]
[im 67/201  soft-tissue]
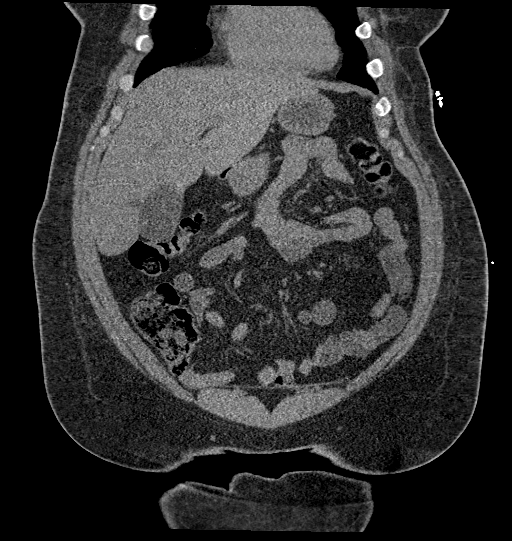
[im 89/201  soft-tissue]
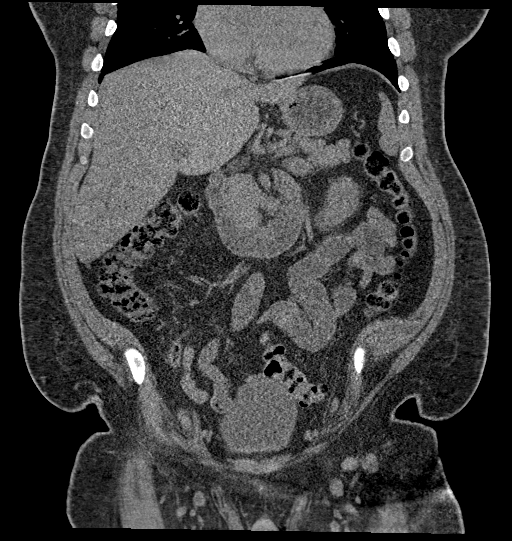
[im 112/201  soft-tissue]
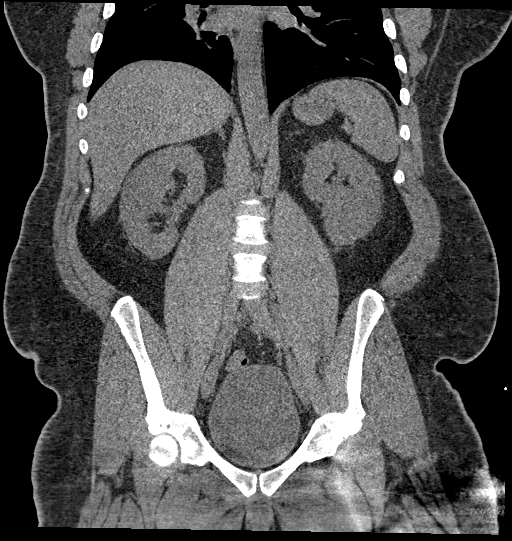

[17 of 46 positions shown; findings below may reference images not displayed]

FINDINGS: Lower chest: Mild right lower lobe bronchiectasis and round
atelectasis and scarring as before. No acute airspace disease.

Hepatobiliary: Hepatic steatosis. Gallstones. No biliary dilatation.

Pancreas: Unremarkable. No pancreatic ductal dilatation or
surrounding inflammatory changes.

Spleen: Normal in size without focal abnormality.

Adrenals/Urinary Tract: Adrenal glands are unremarkable. Kidneys are
normal, without renal calculi, focal lesion, or hydronephrosis.
Bladder is unremarkable.

Stomach/Bowel: Stomach is within normal limits. Appendix appears
normal. No evidence of bowel wall thickening, distention, or
inflammatory changes.

Vascular/Lymphatic: Nonaneurysmal aorta. Borderline retroperitoneal
lymph nodes measuring up to 16 mm but grossly stable.

Reproductive: Prostate is unremarkable.

Other: Negative for pelvic effusion or free air

Musculoskeletal: No acute osseous abnormality
IMPRESSION: 1. Negative for hydronephrosis or ureteral stone
2. Hepatic steatosis
3. Chronic bronchiectasis and round atelectasis at the right base
4. Gallstones without right upper quadrant inflammation

## 2021-06-22 MED ORDER — ATORVASTATIN CALCIUM 40 MG PO TABS
80.0000 mg | ORAL_TABLET | Freq: Every day | ORAL | Status: DC
  Administered 2021-06-22 – 2021-06-27 (×6): 80 mg via ORAL
  Filled 2021-06-22 (×6): qty 2

## 2021-06-22 MED ORDER — ONDANSETRON HCL 4 MG/2ML IJ SOLN: 4.0000 mg | Freq: Four times a day (QID) | INTRAMUSCULAR | Status: DC | PRN

## 2021-06-22 MED ORDER — VANCOMYCIN HCL 2000 MG/400ML IV SOLN
2000.0000 mg | Freq: Once | INTRAVENOUS | Status: AC
  Administered 2021-06-22: 2000 mg via INTRAVENOUS
  Filled 2021-06-22: qty 400

## 2021-06-22 MED ORDER — LACTATED RINGERS IV BOLUS
1000.0000 mL | Freq: Once | INTRAVENOUS | Status: AC
  Administered 2021-06-22: 1000 mL via INTRAVENOUS

## 2021-06-22 MED ORDER — HYDROMORPHONE HCL 1 MG/ML IJ SOLN
0.5000 mg | INTRAMUSCULAR | Status: DC | PRN
  Administered 2021-06-22 – 2021-06-26 (×20): 0.5 mg via INTRAVENOUS
  Filled 2021-06-22 (×5): qty 0.5
  Filled 2021-06-22: qty 1
  Filled 2021-06-22: qty 0.5
  Filled 2021-06-22: qty 1
  Filled 2021-06-22 (×12): qty 0.5

## 2021-06-22 MED ORDER — ACETAMINOPHEN 650 MG RE SUPP: 650.0000 mg | Freq: Four times a day (QID) | RECTAL | Status: DC | PRN

## 2021-06-22 MED ORDER — SODIUM CHLORIDE 0.9 % IV SOLN
2.0000 g | INTRAVENOUS | Status: DC
  Administered 2021-06-22 – 2021-06-24 (×3): 2 g via INTRAVENOUS
  Filled 2021-06-22 (×3): qty 20

## 2021-06-22 MED ORDER — ACETAMINOPHEN 325 MG PO TABS
650.0000 mg | ORAL_TABLET | Freq: Four times a day (QID) | ORAL | Status: DC | PRN
  Administered 2021-06-22: 650 mg via ORAL
  Filled 2021-06-22: qty 2

## 2021-06-22 MED ORDER — COLCHICINE 0.6 MG PO TABS
0.6000 mg | ORAL_TABLET | Freq: Once | ORAL | Status: AC
Start: 2021-06-22 — End: 2021-06-22
  Administered 2021-06-22: 0.6 mg via ORAL
  Filled 2021-06-22: qty 1

## 2021-06-22 MED ORDER — VANCOMYCIN HCL 2000 MG/400ML IV SOLN
2000.0000 mg | INTRAVENOUS | Status: DC
  Administered 2021-06-22 – 2021-06-24 (×3): 2000 mg via INTRAVENOUS
  Filled 2021-06-22 (×3): qty 400

## 2021-06-22 MED ORDER — NALOXONE HCL 0.4 MG/ML IJ SOLN: 0.4000 mg | INTRAMUSCULAR | Status: DC | PRN

## 2021-06-22 MED ORDER — INSULIN DETEMIR 100 UNIT/ML ~~LOC~~ SOLN
10.0000 [IU] | Freq: Two times a day (BID) | SUBCUTANEOUS | Status: DC
  Filled 2021-06-22 (×3): qty 0.1

## 2021-06-22 MED ORDER — INSULIN ASPART 100 UNIT/ML IJ SOLN
0.0000 [IU] | Freq: Four times a day (QID) | INTRAMUSCULAR | Status: DC
  Administered 2021-06-22 (×2): 2 [IU] via SUBCUTANEOUS
  Administered 2021-06-23: 7 [IU] via SUBCUTANEOUS
  Administered 2021-06-23: 2 [IU] via SUBCUTANEOUS
  Administered 2021-06-23: 3 [IU] via SUBCUTANEOUS
  Administered 2021-06-23: 2 [IU] via SUBCUTANEOUS
  Administered 2021-06-24: 5 [IU] via SUBCUTANEOUS
  Administered 2021-06-24: 7 [IU] via SUBCUTANEOUS
  Administered 2021-06-24: 9 [IU] via SUBCUTANEOUS
  Administered 2021-06-24 – 2021-06-25 (×3): 7 [IU] via SUBCUTANEOUS
  Filled 2021-06-22: qty 0.09

## 2021-06-22 MED ORDER — MORPHINE SULFATE (PF) 4 MG/ML IV SOLN
4.0000 mg | Freq: Once | INTRAVENOUS | Status: AC
  Administered 2021-06-22: 4 mg via INTRAVENOUS
  Filled 2021-06-22: qty 1

## 2021-06-22 MED ORDER — SODIUM CHLORIDE 0.9 % IV SOLN
2.0000 g | INTRAVENOUS | Status: DC
  Administered 2021-06-22: 2 g via INTRAVENOUS
  Filled 2021-06-22: qty 20

## 2021-06-22 MED ORDER — LACTATED RINGERS IV SOLN: INTRAVENOUS | Status: DC

## 2021-06-22 MED ORDER — HYDROMORPHONE HCL 1 MG/ML IJ SOLN
0.5000 mg | Freq: Once | INTRAMUSCULAR | Status: AC
  Administered 2021-06-22: 0.5 mg via INTRAVENOUS
  Filled 2021-06-22: qty 1

## 2021-06-22 MED ORDER — METRONIDAZOLE 500 MG PO TABS
500.0000 mg | ORAL_TABLET | Freq: Two times a day (BID) | ORAL | Status: DC
  Administered 2021-06-22 – 2021-06-25 (×8): 500 mg via ORAL
  Filled 2021-06-22 (×8): qty 1

## 2021-06-22 MED ORDER — COLCHICINE 0.6 MG PO TABS
1.2000 mg | ORAL_TABLET | Freq: Every day | ORAL | Status: DC
  Administered 2021-06-22 – 2021-06-27 (×6): 1.2 mg via ORAL
  Filled 2021-06-22 (×6): qty 2

## 2021-06-22 MED ORDER — ONDANSETRON HCL 4 MG/2ML IJ SOLN
4.0000 mg | Freq: Once | INTRAMUSCULAR | Status: AC
Start: 2021-06-22 — End: 2021-06-22
  Administered 2021-06-22: 4 mg via INTRAVENOUS
  Filled 2021-06-22: qty 2

## 2021-06-22 NOTE — Assessment & Plan Note (Signed)
 #)   CKD 3B: Documented history of such, associate with baseline creatinine range 1.6-2.1, with presenting serum creatinine noted to be within this baseline range.  Plan: Monitor strict I's and O's Daily weights.  Tempt avoid nephrotoxic agents.  Repeat CMP in the morning.

## 2021-06-22 NOTE — Assessment & Plan Note (Signed)
 #)   Acute gout flare: Left wrist pain/swelling consistent with the patient's previous episodes of acute gout exacerbation in terms of distribution, quality, intensity.  In the setting of his underlying diabetes, will refrain from steroids, and in the context of his history of chronic kidney disease, will refrain from NSAIDs, including indomethacin.  Rather, will initiate colchicine, as further quantified below.  Of note, plan films of the left hand/left wrist showed no evidence of acute osseous abnormality, including no evidence of fracture.  Plan: Colchicine 1.2 mg p.o. daily, with additional dose of 0.6 mg p.o. x1 approximately 2 hours after aforementioned initial dose

## 2021-06-22 NOTE — ED Notes (Signed)
Diet ginger ale provided. Pt states no further needs.

## 2021-06-22 NOTE — Assessment & Plan Note (Signed)
   #)   Hyperlipidemia: documented h/o such. On high intensity atorvastatin as outpatient.    Plan: continue home statin.      

## 2021-06-22 NOTE — Consult Note (Signed)
Reason for Consult:Infection  Referring Physician: Dr. Raiford Noble, DO  Barry [name] is an 46 y.o. male.  HPI: 46 y.o. male with medical history significant for type 2 diabetes mellitus, gout, hyperlipidemia, stage IIIb chronic kidney disease associated with baseline creatinine range 1.6-2.1, who is admitted to [facility] on 06/21/2021 with sepsis due to cellulitis of the left foot after presenting from home to [facility] ED complaining of subjective fever.   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 Amalia Hailey on 06/10/2021 He has followed up with Dr. Amalia Hailey on 5/31. Per the patient there was an area at the end of the incision they were worried about. He was discharged with Augmentin and doxycyline which he states he has been taking. Despite oral antibiotics the wound has worsened and he developed drainage over the last few days.   He has been started on vancomycin, rocephin and flagyl.   MRI pending.   Previous arterial studies shows mild arterial disease but TBI normal.   Also has right foot pain and swelling.   Past Medical History:  Diagnosis Date   Carpal tunnel syndrome    Diabetes mellitus without complication ([place])    Gout    Hypercholesteremia    Hypothyroidism    Neuropathy    Obesity    Thyroid disease     Past Surgical History:  Procedure Laterality Date   AMPUTATION Left 06/10/2021   Procedure: 5th RAY AMPUTATION LEFT FOOT;  Surgeon: Edrick Kins, DPM;  Location: [facility];  Service: Podiatry;  Laterality: Left;   FRACTURE SURGERY     INCISE AND DRAIN ABCESS     IRRIGATION AND DEBRIDEMENT FOOT Right 04/27/2019   Procedure: Debridement and Irrigation with possible placement of antibiotic beads;  Surgeon: Evelina Bucy, DPM;  Location: [facility];  Service: Podiatry;  Laterality: Right;   ORIF TOE FRACTURE Right 04/27/2019   Procedure: Open Reduction vs Excision of Fracture Fragment Right great toe, with  pinning.;  Surgeon: Evelina Bucy, DPM;  Location: [facility];  Service: Podiatry;  Laterality: Right;   TONSILLECTOMY      History reviewed. No pertinent family history.  Social History:  reports that he has never smoked. He has never used smokeless tobacco. He reports current alcohol use. He reports that he does not use drugs.  Allergies: No Known Allergies  Medications: I have reviewed the patient's current medications.  Results for orders placed or performed during the hospital encounter of 06/21/21 (from the past 48 hour(s))  Urinalysis, Routine w reflex microscopic Urine, Clean Catch     Status: Abnormal   Collection Time: 06/21/21 10:36 PM  Result Value Ref Range   Color, Urine YELLOW YELLOW   APPearance CLEAR CLEAR   Specific Gravity, Urine 1.017 1.005 - 1.030   pH 5.0 5.0 - 8.0   Glucose, UA >=500 (A) NEGATIVE mg/dL   Hgb urine dipstick NEGATIVE NEGATIVE   Bilirubin Urine NEGATIVE NEGATIVE   Ketones, ur NEGATIVE NEGATIVE mg/dL   Protein, ur >=300 (A) NEGATIVE mg/dL   Nitrite NEGATIVE NEGATIVE   Leukocytes,Ua NEGATIVE NEGATIVE   RBC / HPF 0-5 0 - 5 RBC/hpf   WBC, UA 0-5 0 - 5 WBC/hpf   Bacteria, UA RARE (A) NONE SEEN   Mucus PRESENT     Comment: Performed at [facility] [employer] [address] [place], [place] [address]  Lactic acid, plasma     Status: None  Collection Time: 06/21/21 11:25 PM  Result Value Ref Range   Lactic Acid, Venous 1.1 0.5 - 1.9 mmol/L    Comment: Performed at [facility], [place] [address], [place], [place] [address]  Comprehensive metabolic panel     Status: Abnormal   Collection Time: 06/21/21 11:25 PM  Result Value Ref Range   Sodium 136 135 - 145 mmol/L   Potassium 4.2 3.5 - 5.1 mmol/L   Chloride 105 98 - 111 mmol/L   CO2 24 22 - 32 mmol/L   Glucose, Bld 134 (H) 70 - 99 mg/dL    Comment: Glucose reference range applies only to samples taken after fasting for at least 8 hours.   BUN 26 (H) 6 - 20 mg/dL    Creatinine, Ser 1.83 (H) 0.61 - 1.24 mg/dL   Calcium 8.6 (L) 8.9 - 10.3 mg/dL   Total Protein 7.9 6.5 - 8.1 g/dL   Albumin 3.0 (L) 3.5 - 5.0 g/dL   AST 13 (L) 15 - 41 U/L   ALT 15 0 - 44 U/L   Alkaline Phosphatase 53 38 - 126 U/L   Total Bilirubin 0.8 0.3 - 1.2 mg/dL   GFR, Estimated 46 (L) >60 mL/min    Comment: (NOTE) Calculated using the CKD-EPI Creatinine Equation (2021)    Anion gap 7 5 - 15    Comment: Performed at [facility], [place] [address], [place], [place] [address]  CBC with Differential     Status: Abnormal   Collection Time: 06/21/21 11:25 PM  Result Value Ref Range   WBC 12.1 (H) 4.0 - 10.5 K/uL   RBC 3.94 (L) 4.22 - 5.81 MIL/uL   Hemoglobin 12.1 (L) 13.0 - 17.0 g/dL   HCT 35.9 (L) 39.0 - 52.0 %   MCV 91.1 80.0 - 100.0 fL   MCH 30.7 26.0 - 34.0 pg   MCHC 33.7 30.0 - 36.0 g/dL   RDW 13.5 11.5 - 15.5 %   Platelets 344 150 - 400 K/uL   nRBC 0.0 0.0 - 0.2 %   Neutrophils Relative % 68 %   Neutro Abs 8.2 (H) 1.7 - 7.7 K/uL   Lymphocytes Relative 17 %   Lymphs Abs 2.1 0.7 - 4.0 K/uL   Monocytes Relative 11 %   Monocytes Absolute 1.3 (H) 0.1 - 1.0 K/uL   Eosinophils Relative 3 %   Eosinophils Absolute 0.3 0.0 - 0.5 K/uL   Basophils Relative 0 %   Basophils Absolute 0.0 0.0 - 0.1 K/uL   Immature Granulocytes 1 %   Abs Immature Granulocytes 0.10 (H) 0.00 - 0.07 K/uL    Comment: Performed at [facility], [place] [address], [place], [place] [address]  Protime-INR     Status: None   Collection Time: 06/21/21 11:25 PM  Result Value Ref Range   Prothrombin Time 13.3 11.4 - 15.2 seconds   INR 1.0 0.8 - 1.2    Comment: (NOTE) INR goal varies based on device and disease states. Performed at [facility], [place] [address] [place], [place] [address]   APTT     Status: None   Collection Time: 06/21/21 11:25 PM  Result Value Ref Range   aPTT 32 24 - 36 seconds    Comment: Performed at [facility],  [place] [address] [place], [place] [address]  Lactic acid, plasma     Status: None   Collection Time: 06/22/21  6:15 AM  Result Value Ref Range  Lactic Acid, Venous 0.8 0.5 - 1.9 mmol/L    Comment: Performed at [facility], [place] [address], [place], [place] [address]  Magnesium     Status: None   Collection Time: 06/22/21  6:15 AM  Result Value Ref Range   Magnesium 1.8 1.7 - 2.4 mg/dL    Comment: Performed at [facility], [place] [address], [place], [place] [address]  Comprehensive metabolic panel     Status: Abnormal   Collection Time: 06/22/21  6:15 AM  Result Value Ref Range   Sodium 134 (L) 135 - 145 mmol/L   Potassium 4.4 3.5 - 5.1 mmol/L   Chloride 103 98 - 111 mmol/L   CO2 22 22 - 32 mmol/L   Glucose, Bld 147 (H) 70 - 99 mg/dL    Comment: Glucose reference range applies only to samples taken after fasting for at least 8 hours.   BUN 26 (H) 6 - 20 mg/dL   Creatinine, Ser 1.67 (H) 0.61 - 1.24 mg/dL   Calcium 8.4 (L) 8.9 - 10.3 mg/dL   Total Protein 7.2 6.5 - 8.1 g/dL   Albumin 2.8 (L) 3.5 - 5.0 g/dL   AST 12 (L) 15 - 41 U/L   ALT 14 0 - 44 U/L   Alkaline Phosphatase 50 38 - 126 U/L   Total Bilirubin 0.8 0.3 - 1.2 mg/dL   GFR, Estimated 52 (L) >60 mL/min    Comment: (NOTE) Calculated using the CKD-EPI Creatinine Equation (2021)    Anion gap 9 5 - 15    Comment: Performed at [facility], [place] [address], [place], [place] [address]  CBC with Differential/Platelet     Status: Abnormal   Collection Time: 06/22/21  6:15 AM  Result Value Ref Range   WBC 12.6 (H) 4.0 - 10.5 K/uL   RBC 3.74 (L) 4.22 - 5.81 MIL/uL   Hemoglobin 11.3 (L) 13.0 - 17.0 g/dL   HCT 34.2 (L) 39.0 - 52.0 %   MCV 91.4 80.0 - 100.0 fL   MCH 30.2 26.0 - 34.0 pg   MCHC 33.0 30.0 - 36.0 g/dL   RDW 13.3 11.5 - 15.5 %   Platelets 324 150 - 400 K/uL   nRBC 0.0 0.0 - 0.2 %   Neutrophils Relative % 68 %   Neutro Abs 8.5 (H) 1.7 - 7.7 K/uL   Lymphocytes  Relative 17 %   Lymphs Abs 2.1 0.7 - 4.0 K/uL   Monocytes Relative 13 %   Monocytes Absolute 1.6 (H) 0.1 - 1.0 K/uL   Eosinophils Relative 2 %   Eosinophils Absolute 0.3 0.0 - 0.5 K/uL   Basophils Relative 0 %   Basophils Absolute 0.0 0.0 - 0.1 K/uL   Immature Granulocytes 0 %   Abs Immature Granulocytes 0.05 0.00 - 0.07 K/uL    Comment: Performed at [facility], [place] [address], [place], [place] [address]  Sedimentation rate     Status: Abnormal   Collection Time: 06/22/21  6:15 AM  Result Value Ref Range   Sed Rate 149 (H) 0 - 16 mm/hr    Comment: Performed at [facility], [place] [address] [place], [place] [address]  CBG monitoring, ED     Status: Abnormal   Collection Time: 06/22/21  9:52 AM  Result Value Ref Range   Glucose-Capillary 168 (H) 70 - 99 mg/dL    Comment: Glucose reference range applies only to samples taken after fasting for at least  8 hours.  Glucose, capillary     Status: Abnormal   Collection Time: 06/22/21 12:57 PM  Result Value Ref Range   Glucose-Capillary 166 (H) 70 - 99 mg/dL    Comment: Glucose reference range applies only to samples taken after fasting for at least 8 hours.    DG Elbow Complete Left  Result Date: 06/22/2021 CLINICAL DATA:  Left elbow pain with swelling EXAM: LEFT ELBOW - COMPLETE 3+ VIEW COMPARISON:  None Available. FINDINGS: No fracture or malalignment. Mild degenerative changes on the ulnar side of the elbow. No significant elbow effusion IMPRESSION: No acute osseous abnormality Electronically Signed   By: Donavan Foil M.D.   On: 06/22/2021 00:16   DG Chest Port 1 View  Result Date: 06/22/2021 CLINICAL DATA:  Right flank pain and fever EXAM: PORTABLE CHEST 1 VIEW COMPARISON:  05/24/2021 FINDINGS: The heart size and mediastinal contours are within normal limits. Both lungs are clear. The visualized skeletal structures are unremarkable. IMPRESSION: No active disease. Electronically Signed   By: Donavan Foil M.D.   On: 06/22/2021 00:15   DG Hand Complete Left  Result Date: 06/22/2021 CLINICAL DATA:  Finger pain EXAM: LEFT HAND - COMPLETE 3+ VIEW COMPARISON:  None Available. FINDINGS: No acute displaced fracture or malalignment. Old fifth metacarpal fracture. Degenerative changes at the D IP joints. Soft tissues are unremarkable IMPRESSION: 1. No acute osseous abnormality 2. Old fifth metacarpal fracture Electronically Signed   By: Donavan Foil M.D.   On: 06/22/2021 00:22   DG Foot Complete Left  Result Date: 06/22/2021 CLINICAL DATA:  Possible sepsis recent foot amputation EXAM: LEFT FOOT - COMPLETE 3+ VIEW COMPARISON:  06/10/2021 FINDINGS: Patient is status post transmetatarsal amputation of the fifth digit. Cut margins remain smooth. No osseous destructive change. Cutaneous staples remain in place. Degenerative changes at the first MTP joint. IMPRESSION: Stable transmetatarsal amputation of the fifth digit without acute osseous abnormality Electronically Signed   By: Donavan Foil M.D.   On: 06/22/2021 00:21   CT Renal Stone Study  Result Date: 06/22/2021 CLINICAL DATA:  Right flank pain EXAM: CT ABDOMEN AND PELVIS WITHOUT CONTRAST TECHNIQUE: Multidetector CT imaging of the abdomen and pelvis was performed following the standard protocol without IV contrast. RADIATION DOSE REDUCTION: This exam was performed according to the departmental dose-optimization program which includes automated exposure control, adjustment of the mA and/or kV according to patient size and/or use of iterative reconstruction technique. COMPARISON:  Renal ultrasound 05/18/2019, CT 04/08/2016 FINDINGS: Lower chest: Mild right lower lobe bronchiectasis and round atelectasis and scarring as before. No acute airspace disease. Hepatobiliary: Hepatic steatosis. Gallstones. No biliary dilatation. Pancreas: Unremarkable. No pancreatic ductal dilatation or surrounding inflammatory changes. Spleen: Normal in size without focal  abnormality. Adrenals/Urinary Tract: Adrenal glands are unremarkable. Kidneys are normal, without renal calculi, focal lesion, or hydronephrosis. Bladder is unremarkable. Stomach/Bowel: Stomach is within normal limits. Appendix appears normal. No evidence of bowel wall thickening, distention, or inflammatory changes. Vascular/Lymphatic: Nonaneurysmal aorta. Borderline retroperitoneal lymph nodes measuring up to 16 mm but grossly stable. Reproductive: Prostate is unremarkable. Other: Negative for pelvic effusion or free air Musculoskeletal: No acute osseous abnormality IMPRESSION: 1. Negative for hydronephrosis or ureteral stone 2. Hepatic steatosis 3. Chronic bronchiectasis and round atelectasis at the right base 4. Gallstones without right upper quadrant inflammation Electronically Signed   By: Donavan Foil M.D.   On: 06/22/2021 01:51    Review of Systems Blood pressure (!) 145/97, pulse 95, temperature 98 F (36.7 C), temperature  source Oral, resp. rate 18, height 6\' 3"  (1.905 m), weight (!) 152.5 kg, SpO2 99 %. Physical Exam General: AAO x3, NAD  Dermatological: Incision coapted with sutures, staples intact in the left foot.  There is some mild purulence along the sutures distally.  Is able to remove a few of the sutures while the staples.  Appear to be more of a stitch abscess.  However there is edema and erythema to the foot.  No areas of fluctuance or crepitation.  No other lesions on the left side.  Edema present to right foot but no open lesions.  Vascular: Dorsalis Pedis artery and Posterior Tibial artery pedal pulses are palpable with deep bilateral with immedate capillary fill time.  Edema present bilaterally. There is no pain with calf compression, swelling, warmth, erythema.   Neruologic: Sensation decreased.  Musculoskeletal: Tenderness along the incision and this corresponds on the area where is able to remove some superficial purulence.   Assessment/Plan: Left foot cellulitis, rule  out underlying osteomyelitis, abscess  Reviewed the x-rays which are negative for osteomyelitis.  MRI was ordered.  I did remove the staples on the left side as well as a few of the stitches in which there was some stitch abscess apparent.  Continue IV antibiotics.  Elevation.  X-rays ordered for the right foot given the foot.  Podiatry will continue to follow.  Trula Slade 06/22/2021, 2:25 PM

## 2021-06-22 NOTE — Progress Notes (Signed)
A consult was received from an ED physician for Vancomycin per pharmacy dosing.  The patient's profile has been reviewed for ht/wt/allergies/indication/available labs.   A one time order has been placed for Vancomycin 2gm IV.  Further antibiotics/pharmacy consults should be ordered by admitting physician if indicated.                       Thank you, Junita Push PharmD 06/22/2021  12:42 AM

## 2021-06-22 NOTE — Progress Notes (Signed)
Sepsis tracking by eLINK 

## 2021-06-22 NOTE — Assessment & Plan Note (Signed)
  #)   Type 2 Diabetes Mellitus: documented history of such. Home insulin regimen: Humalog 75/25, 100 units twice daily.. Home oral hypoglycemic agents: Jardiance.  He is also on Ozempic as an outpatient presenting blood sugar: 134. Most recent A1c noted to be 10.7% when checked on 06/09/2021.  Relative to the 75/25 insulin taken on a twice daily basis as an outpatient, will initiate slightly less than half of his outpatient long-acting dose, as further quantified below.   Plan: accuchecks QAC and HS with low dose SSI.  Levemir 10 units SQ twice daily.  Hold home Jardiance and Ozempic during this hospitalization.

## 2021-06-22 NOTE — Assessment & Plan Note (Signed)
(  Please see cellulitis of left lower extremity)

## 2021-06-22 NOTE — H&P (Signed)
History and Physical    PLEASE NOTE THAT DRAGON DICTATION SOFTWARE WAS USED IN THE CONSTRUCTION OF THIS NOTE.   Barry [name] VOJ:[id number] DOB: 09-Dec-1975 DOA: 06/21/2021  PCP: Maryella Shivers, MD  Patient coming from: home   I have personally briefly reviewed patient's old medical records in [place]  Chief Complaint: Subjective fever  HPI: Barry [name] is a 46 y.o. male with medical history significant for type 2 diabetes mellitus, gout, hyperlipidemia, stage IIIb chronic kidney disease associated with baseline creatinine range 1.6-2.1, who is admitted to [facility] on 06/21/2021 with sepsis due to cellulitis of the left foot after presenting from home to [facility] ED complaining of subjective fever.   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 Amalia Hailey of podiatry on 06/10/2021.  Associated culture was positive for MRSA that was sensitive to vancomycin as well as gentamicin.  The patient was initially discharged to home on doxycycline as well as Augmentin but notes interval development of worsening left foot swelling, erythema, discomfort, increased warmth, as well as development of purulent drainage over the last 2 to 3 days.  Is been associated with subjective fever over that timeframe in the absence of chills, full body rigors, or generalized myalgias.  The patient reports that he followed up with his outpatient podiatrist, Dr. Amalia Hailey, on 6-23, who after evaluating patient's foot, recommended that the patient present to the emergency department for further evaluation and management of sepsis versus myelitis of the left lower extremity, including infusion of IV antibiotics given failure of outpatient antibiotics.   Patient also reports left wrist discomfort associated with mild inflammation, which she reports is consistent with previous gout exacerbations in terms of distribution,  intensity and quality.    ED Course:  Vital signs in the ED were notable for the following: Afebrile; heart rate 10 8-1 14; blood pressure 150/87; respiratory rate 15-23, oxygen saturation 94 to 100% on room air.  Labs were notable for the following: BMP notable for the following: Creatinine 1.3 compared to most recent value of 2.14 on 06/11/2021, glucose 134.  CBC notable for white blood cell count 12,100.  Lactate 1.1.  INR 1.0.  Urinalysis notable for no white blood cells.  Blood cultures x2 with.  Initiation of IV antibiotics in the form of IV vancomycin, Rocephin, and Flagyl.  Imaging and additional notable ED work-up: Chest x-ray showed evidence of acute cardiopulmonary process.  Plain films of the left hand/wrist showed no evidence of acute process, including no evidence of fracture.  Plain films of the left foot showed stable transmetatarsal imitation of the left fifth digit without evidence of acute osseous abnormality.   Subsequently, the patient was admitted for further evaluation and management of sepsis due to suspected left lower extremity cellulitis, noting failure of outpatient antibiotics.    Review of Systems: As per HPI otherwise 10 point review of systems negative.   Past Medical History:  Diagnosis Date   Carpal tunnel syndrome    Diabetes mellitus without complication ([place])    Gout    Hypercholesteremia    Hypothyroidism    Neuropathy    Obesity    Thyroid disease     Past Surgical History:  Procedure Laterality Date   AMPUTATION Left 06/10/2021   Procedure: 5th RAY AMPUTATION LEFT FOOT;  Surgeon: Edrick Kins, DPM;  Location: [facility];  Service: Podiatry;  Laterality: Left;   FRACTURE SURGERY  INCISE AND DRAIN ABCESS     IRRIGATION AND DEBRIDEMENT FOOT Right 04/27/2019   Procedure: Debridement and Irrigation with possible placement of antibiotic beads;  Surgeon: Evelina Bucy, DPM;  Location: [facility];  Service: Podiatry;  Laterality: Right;   ORIF TOE  FRACTURE Right 04/27/2019   Procedure: Open Reduction vs Excision of Fracture Fragment Right great toe, with pinning.;  Surgeon: Evelina Bucy, DPM;  Location: [facility];  Service: Podiatry;  Laterality: Right;   TONSILLECTOMY      Social History:  reports that he has never smoked. He has never used smokeless tobacco. He reports current alcohol use. He reports that he does not use drugs.   No Known Allergies  History reviewed. No pertinent family history.   Prior to Admission medications   Medication Sig Start Date End Date Taking? Authorizing Provider  allopurinol (ZYLOPRIM) 300 MG tablet Take 300 mg by mouth daily. 10/22/19  Yes [provider]  amoxicillin-clavulanate (AUGMENTIN) 875-125 MG tablet Take 1 tablet by mouth 2 (two) times daily for 10 days. 06/12/21 06/22/21 Yes Caren Griffins, MD  aspirin 81 MG chewable tablet Chew 81 mg by mouth daily. 05/03/17  Yes [provider]  atorvastatin (LIPITOR) 80 MG tablet Take 80 mg by mouth daily.  11/21/17  Yes [provider]  doxycycline (VIBRAMYCIN) 100 MG capsule Take 1 capsule (100 mg total) by mouth 2 (two) times daily for 10 days. One po bid x 7 days 06/12/21 06/22/21 Yes Gherghe, Vella Redhead, MD  furosemide (LASIX) 20 MG tablet Take 20 mg by mouth daily. 01/17/20  Yes [provider]  gentamicin ointment (GARAMYCIN) 0.1 % Apply 1 application. topically daily. For wound care 04/22/21  Yes Stover, Titorya, DPM  HUMALOG MIX 75/25 (75-25) 100 UNIT/ML SUSP injection Inject 100 Units into the skin 2 (two) times a day. 06/07/18  Yes [provider]  JARDIANCE 25 MG TABS tablet Take 25 mg by mouth daily. 10/18/18  Yes [provider]  losartan (COZAAR) 25 MG tablet Take 25 mg by mouth 2 (two) times daily. 06/14/21  Yes [provider]  naloxone (NARCAN) nasal spray 4 mg/0.1 mL Place 1 spray into the nose once. 06/12/21  Yes [provider]  oxyCODONE-acetaminophen (PERCOCET) 5-325 MG  tablet Take 1 tablet by mouth every 6 (six) hours as needed for severe pain. 06/19/21  Yes Edrick Kins, DPM  OZEMPIC, 0.25 OR 0.5 MG/DOSE, 2 MG/3ML SOPN Inject 0.5 mg as directed once a week. fridays 05/21/21  Yes [provider]  Accu-Chek Softclix Lancets lancets 1 each 3 (three) times daily. 02/01/21   [provider]  Blood Glucose Monitoring Suppl (GLUCOCOM BLOOD GLUCOSE MONITOR) DEVI 1 each by Other route 3 (three) times daily. 01/30/21   [provider]  pregabalin (LYRICA) 75 MG capsule Take 1 capsule (75 mg total) by mouth 2 (two) times daily. Patient not taking: Reported on 06/21/2021 08/23/19   Landis Martins, DPM  RELION INSULIN SYRINGE 1ML/31G 31G X 5/16" 1 ML MISC Inject 1 Syringe into the skin 2 (two) times daily. 01/26/21   [provider]  silver sulfADIAZINE (SILVADENE) 1 % cream Apply pea-sized amount to wound daily. Patient not taking: Reported on 06/21/2021 02/14/21   Evelina Bucy, DPM     Objective    Physical Exam: Vitals:   06/22/21 0100 06/22/21 0115 06/22/21 0200 06/22/21 0230  BP: (!) 147/78 (!) 136/113 (!) 153/90 (!) 144/94  Pulse: (!) 113 (!) 110 (!) 110 Marland Kitchen)  109  Resp: (!) 23 (!) _0 Temp:      TempSrc:      SpO2: 100% 96% 98% 94%    General: appears to be stated age; alert, oriented Skin: warm, dry, status post transmetatarsal imitation of the left fifth digit noted, with erythema associated with the plantar aspect of the left foot with associated increased warmth, tenderness, swelling; lateral staples noted. Head:  AT/[place] Mouth:  Oral mucosa membranes appear dry, normal dentition Neck: supple; trachea midline Heart:  RRR; did not appreciate any M/R/G Lungs: CTAB, did not appreciate any wheezes, rales, or rhonchi Abdomen: + BS; soft, ND, NT Vascular: 2+ pedal pulses b/l; 2+ radial pulses b/l Extremities: n no muscle wasting; left foot erythema, tenderness, increased warmth, swelling, and staples noted, as further  detailed above Neuro: strength and sensation intact in upper and lower extremities b/l     Labs on Admission: I have personally reviewed following labs and imaging studies  CBC: Recent Labs  Lab 06/21/21 2325  WBC 12.1*  NEUTROABS 8.2*  HGB 12.1*  HCT 35.9*  MCV 91.1  PLT 808   Basic Metabolic Panel: Recent Labs  Lab 06/21/21 2325  NA 136  K 4.2  CL 105  CO2 24  GLUCOSE 134*  BUN 26*  CREATININE 1.83*  CALCIUM 8.6*   GFR: Estimated Creatinine Clearance: 82.7 mL/min (A) (by C-G formula based on SCr of 1.83 mg/dL (H)). Liver Function Tests: Recent Labs  Lab 06/21/21 2325  AST 13*  ALT 15  ALKPHOS 53  BILITOT 0.8  PROT 7.9  ALBUMIN 3.0*   No results for input(s): LIPASE, AMYLASE in the last 168 hours. No results for input(s): AMMONIA in the last 168 hours. Coagulation Profile: Recent Labs  Lab 06/21/21 2325  INR 1.0   Cardiac Enzymes: No results for input(s): CKTOTAL, CKMB, CKMBINDEX, TROPONINI in the last 168 hours. BNP (last 3 results) No results for input(s): PROBNP in the last 8760 hours. HbA1C: No results for input(s): HGBA1C in the last 72 hours. CBG: No results for input(s): GLUCAP in the last 168 hours. Lipid Profile: No results for input(s): CHOL, HDL, LDLCALC, TRIG, CHOLHDL, LDLDIRECT in the last 72 hours. Thyroid Function Tests: No results for input(s): TSH, T4TOTAL, FREET4, T3FREE, THYROIDAB in the last 72 hours. Anemia Panel: No results for input(s): VITAMINB12, FOLATE, FERRITIN, TIBC, IRON, RETICCTPCT in the last 72 hours. Urine analysis:    Component Value Date/Time   COLORURINE YELLOW 06/21/2021 2236   APPEARANCEUR CLEAR 06/21/2021 2236   LABSPEC 1.017 06/21/2021 2236   PHURINE 5.0 06/21/2021 2236   GLUCOSEU >=500 (A) 06/21/2021 2236   HGBUR NEGATIVE 06/21/2021 2236   BILIRUBINUR NEGATIVE 06/21/2021 2236   KETONESUR NEGATIVE 06/21/2021 2236   PROTEINUR >=300 (A) 06/21/2021 2236   NITRITE NEGATIVE 06/21/2021 2236    LEUKOCYTESUR NEGATIVE 06/21/2021 2236    Radiological Exams on Admission: DG Elbow Complete Left  Result Date: 06/22/2021 CLINICAL DATA:  Left elbow pain with swelling EXAM: LEFT ELBOW - COMPLETE 3+ VIEW COMPARISON:  None Available. FINDINGS: No fracture or malalignment. Mild degenerative changes on the ulnar side of the elbow. No significant elbow effusion IMPRESSION: No acute osseous abnormality Electronically Signed   By: Donavan Foil M.D.   On: 06/22/2021 00:16   DG Chest Port 1 View  Result Date: 06/22/2021 CLINICAL DATA:  Right flank pain and fever EXAM: PORTABLE CHEST 1 VIEW COMPARISON:  05/24/2021 FINDINGS: The heart size and mediastinal contours are within normal limits. Both  lungs are clear. The visualized skeletal structures are unremarkable. IMPRESSION: No active disease. Electronically Signed   By: Donavan Foil M.D.   On: 06/22/2021 00:15   DG Hand Complete Left  Result Date: 06/22/2021 CLINICAL DATA:  Finger pain EXAM: LEFT HAND - COMPLETE 3+ VIEW COMPARISON:  None Available. FINDINGS: No acute displaced fracture or malalignment. Old fifth metacarpal fracture. Degenerative changes at the D IP joints. Soft tissues are unremarkable IMPRESSION: 1. No acute osseous abnormality 2. Old fifth metacarpal fracture Electronically Signed   By: Donavan Foil M.D.   On: 06/22/2021 00:22   DG Foot Complete Left  Result Date: 06/22/2021 CLINICAL DATA:  Possible sepsis recent foot amputation EXAM: LEFT FOOT - COMPLETE 3+ VIEW COMPARISON:  06/10/2021 FINDINGS: Patient is status post transmetatarsal amputation of the fifth digit. Cut margins remain smooth. No osseous destructive change. Cutaneous staples remain in place. Degenerative changes at the first MTP joint. IMPRESSION: Stable transmetatarsal amputation of the fifth digit without acute osseous abnormality Electronically Signed   By: Donavan Foil M.D.   On: 06/22/2021 00:21   CT Renal Stone Study  Result Date: 06/22/2021 CLINICAL DATA:  Right  flank pain EXAM: CT ABDOMEN AND PELVIS WITHOUT CONTRAST TECHNIQUE: Multidetector CT imaging of the abdomen and pelvis was performed following the standard protocol without IV contrast. RADIATION DOSE REDUCTION: This exam was performed according to the departmental dose-optimization program which includes automated exposure control, adjustment of the mA and/or kV according to patient size and/or use of iterative reconstruction technique. COMPARISON:  Renal ultrasound 05/18/2019, CT 04/08/2016 FINDINGS: Lower chest: Mild right lower lobe bronchiectasis and round atelectasis and scarring as before. No acute airspace disease. Hepatobiliary: Hepatic steatosis. Gallstones. No biliary dilatation. Pancreas: Unremarkable. No pancreatic ductal dilatation or surrounding inflammatory changes. Spleen: Normal in size without focal abnormality. Adrenals/Urinary Tract: Adrenal glands are unremarkable. Kidneys are normal, without renal calculi, focal lesion, or hydronephrosis. Bladder is unremarkable. Stomach/Bowel: Stomach is within normal limits. Appendix appears normal. No evidence of bowel wall thickening, distention, or inflammatory changes. Vascular/Lymphatic: Nonaneurysmal aorta. Borderline retroperitoneal lymph nodes measuring up to 16 mm but grossly stable. Reproductive: Prostate is unremarkable. Other: Negative for pelvic effusion or free air Musculoskeletal: No acute osseous abnormality IMPRESSION: 1. Negative for hydronephrosis or ureteral stone 2. Hepatic steatosis 3. Chronic bronchiectasis and round atelectasis at the right base 4. Gallstones without right upper quadrant inflammation Electronically Signed   By: Donavan Foil M.D.   On: 06/22/2021 01:51     Assessment/Plan    Principal Problem:   Cellulitis of left lower extremity Active Problems:   HLD (hyperlipidemia)   Stage 3b chronic kidney disease (CKD) (HCC)   DM2 (diabetes mellitus, type 2) (HCC)   Sepsis ([place])   Gout flare      #) Sepsis due  to left foot cellulitis: In the setting of recent transmetatarsal amputation of the left fifth digit on 06/10/2021 via Dr. Amalia Hailey of podiatry in the setting of associated osteomyelitis, the patient presents to the emergency department this evening at the recommendation of his podiatrist for further evaluation and management of suspected left foot cellulitis versus osteomyelitis in the context of interval development of left foot erythema, tenderness, swelling, as well as purulent drainage in spite of good compliance with interval oral antibiotics on Augmentin as well as doxycycline.  Of note, plain films of the left foot showed no evidence of acute osseous abnormality, including no evidence of overt osteomyelitis.  We will add on inflammatory markers to further assess,  as further detailed below, along with consideration for MRI of the left foot.  SIRS criteria met via leukocytosis and tachycardia. Of note, in the absence of any evidence of end organ damage, including a non-elevated presenting LA of 1.1, pt's sepsis does not meet criteria to be considered severe in nature. Also, in the absence of LA level greater than or equal to 4.0, and in the absence of any associated hypotension refractory to IVF's, there are no indications for administration of a 30 mL/kg IVF bolus at this time.   Additional ED work-up/management notable for: Blood cultures x2 were collected prior to initiation of broad-spectrum IV antibiotics in the form of IV vancomycin, Rocephin, Flagyl, which, in the context of the patient's underlying diabetes and recent MRSA osteomyelitis, will be continued for now.  No e/o additional infectious process at this time, including chest x-ray showed no evidence of pneumonia, including no evidence of infiltrate, in addition to urinalysis that was inconsistent with UTI.   Plan: CBC w/ diff and CMP in AM. Follow for results of blood cx's x 2. Abx: Continue IV vancomycin, Rocephin, Flagyl, as above.  As  needed IV Dilaudid.  Check CRP and ESR, as above.  Consider MRI left foot.  Consider discussing with podiatry, as above.  Continuous LR.          #) Acute gout flare: Left wrist pain/swelling consistent with the patient's previous episodes of acute gout exacerbation in terms of distribution, quality, intensity.  In the setting of his underlying diabetes, will refrain from steroids, and in the context of his history of chronic kidney disease, will refrain from NSAIDs, including indomethacin.  Rather, will initiate colchicine, as further quantified below.  Of note, plan films of the left hand/left wrist showed no evidence of acute osseous abnormality, including no evidence of fracture.  Plan: Colchicine 1.2 mg p.o. daily, with additional dose of 0.6 mg p.o. x1 approximately 2 hours after aforementioned initial dose           #) Type 2 Diabetes Mellitus: documented history of such. Home insulin regimen: Humalog 75/25, 100 units twice daily.. Home oral hypoglycemic agents: Jardiance.  He is also on Ozempic as an outpatient presenting blood sugar: 134. Most recent A1c noted to be 10.7% when checked on 06/09/2021.  Relative to the 75/25 insulin taken on a twice daily basis as an outpatient, will initiate slightly less than half of his outpatient long-acting dose, as further quantified below.   Plan: accuchecks QAC and HS with low dose SSI.  Levemir 10 units SQ twice daily.  Hold home Jardiance and Ozempic during this hospitalization.         #) Hyperlipidemia: documented h/o such. On high intensity atorvastatin as outpatient.    Plan: continue home statin.           #) CKD 3B: Documented history of such, associate with baseline creatinine range 1.6-2.1, with presenting serum creatinine noted to be within this baseline range.  Plan: Monitor strict I's and O's Daily weights.  Tempt avoid nephrotoxic agents.  Repeat CMP in the morning.        DVT prophylaxis: SCD's    Code Status: Full code Family Communication: I discussed the patient's case, with his mother, who is present bedside Disposition Plan: Per Rounding Team Consults called: none;  Admission status: Inpatient    PLEASE NOTE THAT DRAGON DICTATION SOFTWARE WAS USED IN THE CONSTRUCTION OF THIS NOTE.   [place] DO [facility] [employer] -  7AM   06/22/2021, 4:30 AM

## 2021-06-22 NOTE — Progress Notes (Signed)
Pharmacy Antibiotic Note  Barry [name] is a 46 y.o. male admitted on 06/21/2021 with cellulitis.  Pharmacy has been consulted for Vancomycin dosing.  Plan: Vancomycin 2gm IV q24h to target AUC 400-550 Check Vancomycin levels at steady state Monitor renal function and cx data      Temp (24hrs), Avg:99.1 F (37.3 C), Min:99.1 F (37.3 C), Max:99.1 F (37.3 C)  Recent Labs  Lab 06/21/21 2325  WBC 12.1*  CREATININE 1.83*  LATICACIDVEN 1.1    Estimated Creatinine Clearance: 82.7 mL/min (A) (by C-G formula based on SCr of 1.83 mg/dL (H)).    No Known Allergies  Antimicrobials this admission: 6/3 Ceftriaxone >>  6/3 Vancomycin >>  6/3 Metronidazole >>  Dose adjustments this admission:  Microbiology results: 6/3 BCx:   Thank you for allowing pharmacy to be a part of this patient's care.  Junita Push PharmD 06/22/2021 2:54 AM

## 2021-06-22 NOTE — Assessment & Plan Note (Signed)
#)   Sepsis due to left foot cellulitis: In the setting of recent transmetatarsal amputation of the left fifth digit on 06/10/2021 via Dr. Amalia Hailey of podiatry in the setting of associated osteomyelitis, the patient presents to the emergency department this evening at the recommendation of his podiatrist for further evaluation and management of suspected left foot cellulitis versus osteomyelitis in the context of interval development of left foot erythema, tenderness, swelling, as well as purulent drainage in spite of good compliance with interval oral antibiotics on Augmentin as well as doxycycline.  Of note, plain films of the left foot showed no evidence of acute osseous abnormality, including no evidence of overt osteomyelitis.  We will add on inflammatory markers to further assess, as further detailed below, along with consideration for MRI of the left foot.  SIRS criteria met via leukocytosis and tachycardia. Of note, in the absence of any evidence of end organ damage, including a non-elevated presenting LA of 1.1, pt's sepsis does not meet criteria to be considered severe in nature. Also, in the absence of LA level greater than or equal to 4.0, and in the absence of any associated hypotension refractory to IVF's, there are no indications for administration of a 30 mL/kg IVF bolus at this time.   Additional ED work-up/management notable for: Blood cultures x2 were collected prior to initiation of broad-spectrum IV antibiotics in the form of IV vancomycin, Rocephin, Flagyl, which, in the context of the patient's underlying diabetes and recent MRSA osteomyelitis, will be continued for now.  No e/o additional infectious process at this time, including chest x-ray showed no evidence of pneumonia, including no evidence of infiltrate, in addition to urinalysis that was inconsistent with UTI.   Plan: CBC w/ diff and CMP in AM. Follow for results of blood cx's x 2. Abx: Continue IV vancomycin, Rocephin, Flagyl,  as above.  As needed IV Dilaudid.  Check CRP and ESR, as above.  Consider MRI left foot.  Consider discussing with podiatry, as above.  Continuous LR.

## 2021-06-22 NOTE — Progress Notes (Signed)
Care started prior to midnight in the emergency room and patient was admitted early this morning after midnight by Dr. Babs Bertin I am in current agreement with his assessment and plan.  Additional changes to plan of care been made currently.  Patient is a 46 year old morbidly obese African-American male with past medical history significant for but limited to diabetes mellitus type 2, gout, hyperlipidemia, stage IIIb chronic kidney disease with a creatinine ranging from 1.6-2.1 at baseline as well as other comorbidities who presented to the hospital with foot pain and hand pain as well as a subjective fever.  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  He was initially discharged on doxycycline that he developed worsening left foot swelling, erythema, discomfort and increased warmth as well as development of purulent drainage for the last 2 to 3 days associated with subjective fever as well as chills, full body rigors and myalgias.  Patient states that anytime he is surgery he has gout flares and he feels like he has a flare of gout in his left hand.  He presented to the ED and he was admitted for sepsis secondary to cellulitis with concern for osteomyelitis and I ordered an MRI of his foot given the warmth and erythema.  Podiatry has been consulted for further evaluation and currently his MRI is still pending.  He is started on broad-spectrum antibiotics which we will continue for now and de-escalate as accordingly.  Currently he is being admitted for and treated for the following but not limited to:  Sepsis secondary to left foot cellulitis/surgical site infection with concern for possible underlying osteomyelitis at the surgical site  -Obtaining MRI of the foot and continuing LR at 100 mils per hour -Initiated  on antibiotics with vancomycin, and ceftriaxone -WBC went from 12.1 -> 12.6 -LA went from 1.1 -> 0.8  Acute gout flare  Diabetes mellitus type 2  Hyperlipidemia  CKD stage IIIb  Normocytic Anemia -Check anemia panel in a.m.  Hyponatremia -Sodium was mildly low with a sodium of 134  Morbid Obesity -Complicates overall prognosis and care -Estimated body mass index is 42.01 kg/m as calculated from the following:   Height as of this encounter: 6\' 3"  (1.905 m).   Weight as of this encounter: 152.5 kg.  -Weight Loss and Dietary Counseling given  We will continue to monitor patient's clinical response to intervention and repeat blood work and follow imaging and podiatry evaluation recommendations

## 2021-06-22 NOTE — ED Notes (Signed)
Unable to obtain second set of cultures due to pt. Being a difficult stick. Cultures attempted multiple times by multiple providers.

## 2021-06-23 DIAGNOSIS — E1100 Type 2 diabetes mellitus with hyperosmolarity without nonketotic hyperglycemic-hyperosmolar coma (NKHHC): Secondary | ICD-10-CM | POA: Diagnosis not present

## 2021-06-23 DIAGNOSIS — M1000 Idiopathic gout, unspecified site: Secondary | ICD-10-CM | POA: Diagnosis not present

## 2021-06-23 DIAGNOSIS — E785 Hyperlipidemia, unspecified: Secondary | ICD-10-CM | POA: Diagnosis not present

## 2021-06-23 DIAGNOSIS — L03116 Cellulitis of left lower limb: Secondary | ICD-10-CM

## 2021-06-23 LAB — CBC WITH DIFFERENTIAL/PLATELET
Abs Immature Granulocytes: 0.05 10*3/uL (ref 0.00–0.07)
Basophils Absolute: 0 10*3/uL (ref 0.0–0.1)
Basophils Relative: 0 %
Eosinophils Absolute: 0.5 10*3/uL (ref 0.0–0.5)
Eosinophils Relative: 5 %
HCT: 34.3 % — ABNORMAL LOW (ref 39.0–52.0)
Hemoglobin: 11.3 g/dL — ABNORMAL LOW (ref 13.0–17.0)
Immature Granulocytes: 1 %
Lymphocytes Relative: 19 %
Lymphs Abs: 2 10*3/uL (ref 0.7–4.0)
MCH: 30.3 pg (ref 26.0–34.0)
MCHC: 32.9 g/dL (ref 30.0–36.0)
MCV: 92 fL (ref 80.0–100.0)
Monocytes Absolute: 1.4 10*3/uL — ABNORMAL HIGH (ref 0.1–1.0)
Monocytes Relative: 14 %
Neutro Abs: 6.3 10*3/uL (ref 1.7–7.7)
Neutrophils Relative %: 61 %
Platelets: 313 10*3/uL (ref 150–400)
RBC: 3.73 MIL/uL — ABNORMAL LOW (ref 4.22–5.81)
RDW: 13.2 % (ref 11.5–15.5)
WBC: 10.2 10*3/uL (ref 4.0–10.5)
nRBC: 0 % (ref 0.0–0.2)

## 2021-06-23 LAB — GLUCOSE, CAPILLARY
Glucose-Capillary: 162 mg/dL — ABNORMAL HIGH (ref 70–99)
Glucose-Capillary: 178 mg/dL — ABNORMAL HIGH (ref 70–99)
Glucose-Capillary: 215 mg/dL — ABNORMAL HIGH (ref 70–99)
Glucose-Capillary: 308 mg/dL — ABNORMAL HIGH (ref 70–99)
Glucose-Capillary: 388 mg/dL — ABNORMAL HIGH (ref 70–99)

## 2021-06-23 LAB — COMPREHENSIVE METABOLIC PANEL
ALT: 13 U/L (ref 0–44)
AST: 14 U/L — ABNORMAL LOW (ref 15–41)
Albumin: 2.7 g/dL — ABNORMAL LOW (ref 3.5–5.0)
Alkaline Phosphatase: 55 U/L (ref 38–126)
Anion gap: 9 (ref 5–15)
BUN: 31 mg/dL — ABNORMAL HIGH (ref 6–20)
CO2: 22 mmol/L (ref 22–32)
Calcium: 8.8 mg/dL — ABNORMAL LOW (ref 8.9–10.3)
Chloride: 105 mmol/L (ref 98–111)
Creatinine, Ser: 1.81 mg/dL — ABNORMAL HIGH (ref 0.61–1.24)
GFR, Estimated: 47 mL/min — ABNORMAL LOW (ref >60)
Glucose, Bld: 171 mg/dL — ABNORMAL HIGH (ref 70–99)
Potassium: 4.3 mmol/L (ref 3.5–5.1)
Sodium: 136 mmol/L (ref 135–145)
Total Bilirubin: 0.7 mg/dL (ref 0.3–1.2)
Total Protein: 7.2 g/dL (ref 6.5–8.1)

## 2021-06-23 LAB — MAGNESIUM: Magnesium: 1.9 mg/dL (ref 1.7–2.4)

## 2021-06-23 LAB — C-REACTIVE PROTEIN: CRP: 19.2 mg/dL — ABNORMAL HIGH (ref <1.0)

## 2021-06-23 LAB — PHOSPHORUS: Phosphorus: 3.4 mg/dL (ref 2.5–4.6)

## 2021-06-23 MED ORDER — PREDNISONE 20 MG PO TABS
60.0000 mg | ORAL_TABLET | Freq: Every day | ORAL | Status: DC
Start: 2021-06-24 — End: 2021-06-23

## 2021-06-23 MED ORDER — LACTATED RINGERS IV SOLN
INTRAVENOUS | Status: AC
Start: 2021-06-23 — End: 2021-06-23

## 2021-06-23 MED ORDER — PREDNISONE 20 MG PO TABS
60.0000 mg | ORAL_TABLET | Freq: Every day | ORAL | Status: DC
  Administered 2021-06-23 – 2021-06-27 (×5): 60 mg via ORAL
  Filled 2021-06-23 (×5): qty 3

## 2021-06-23 MED ORDER — INSULIN ASPART PROT & ASPART (70-30 MIX) 100 UNIT/ML ~~LOC~~ SUSP
55.0000 [IU] | Freq: Two times a day (BID) | SUBCUTANEOUS | Status: DC
  Administered 2021-06-23 – 2021-06-24 (×2): 55 [IU] via SUBCUTANEOUS
  Filled 2021-06-23: qty 10

## 2021-06-23 NOTE — Progress Notes (Addendum)
Subjective: Feeling somewhat better today but still having pain to his left wrist as well as his feet.  Denies any fevers or chills.  Objective: AAO x3, NAD DP/PT pulses palpable bilaterally, CRT less than 3 seconds Incision coapted with sutures intact.  Not able to elicit any significant purulence today.  There is dry, scaling skin present mostly distally.  Superficial area skin breakdown but there is no fluctuation or crepitation.  There is still edema present with some erythema and slight increased temperature of the foot but appears to be somewhat improved compared to yesterday.  No clinical signs of an abscess. On the right foot there is still edema present and moderate there is tenderness but no specific area of tenderness.  No erythema or warmth or any open lesions. No pain with calf compression, swelling, warmth, erythema     Assessment: Left foot cellulitis  Plan: White blood cell count improved today.  Mild low-grade temperature.  I reviewed the MRI with him.  There is edema present residual foot metatarsal likely from surgery however cannot rule out early osteomyelitis.  Also some inflammation noted on the fourth metatarsal head and toe.  At this point no definitive evidence of osteomyelitis.  I would recommend continue with broad-spectrum IV antibiotics and await clinical response.  Can consider surgery if needed however appears to be somewhat improved today.  We will continue to monitor.  I discussed the case with Dr. Sherryle Lis who is taking over tomorrow for our practice.  First elevation.  Inflammatory markers quite elevated.  He has had surgery on this left foot and has obvious infection positive swelling of the right foot without any signs of infection he has significant gout in his left wrist and hand.  There is multiple factors that can contribute to this but we did discuss that this could be due to bone infection as well.  We will await clinical response if no improvement  consider surgical intervention.  Colchicine for gout.  Celesta Gentile, DPM

## 2021-06-23 NOTE — Progress Notes (Signed)
PROGRESS NOTE    Barry [name]  IWL:[id number] DOB: 05-12-1975 DOA: 06/21/2021 PCP: Maryella Shivers, MD   Brief Narrative:  Patient is a 46 year old morbidly obese African-American male with past medical history significant for but limited to diabetes mellitus type 2, gout, hyperlipidemia, stage IIIb chronic kidney disease with a creatinine ranging from 1.6-2.1 at baseline as well as other comorbidities who presented to the hospital with foot pain and hand pain as well as a subjective fever.  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  He was initially discharged on doxycycline that he developed worsening left foot swelling, erythema, discomfort and increased warmth as well as development of purulent drainage for the last 2 to 3 days associated with subjective fever as well as chills, full body rigors and myalgias.   Patient states that anytime he is surgery he has gout flares and he feels like he has a flare of gout in his left hand.  He presented to the ED and he was admitted for sepsis secondary to cellulitis with concern for osteomyelitis and I ordered an MRI of his foot given the warmth and erythema.  Podiatry has been consulted for further evaluation and currently his MRI is still pending.  He is started on broad-spectrum antibiotics which we will continue for now and de-escalate as accordingly  Assessment and Plan:  Sepsis secondary to left foot cellulitis/surgical site infection with concern for possible underlying osteomyelitis at the surgical site  -Obtaining MRI of the foot and continuing LR at 100 mils per hour and reduce the rate to 75 MLS per hour for 12 more hours -Initiated on antibiotics with vancomycin, and ceftriaxone and will continue along with IV Flagyl -WBC went from 12.1 -> 12.6 and  is now 10.2 -LA went from 1.1 -> 0.8 -Inflammatory markers are elevated and ESR was 149 CRP was 19.2 -Podiatry evaluated and MRI of the foot was done and cannot entirely rule out osteomyelitis and findings were reviewed but podiatry feels that clinically less likely osteomyelitis but recommends continue to monitor -Sepsis physiology is improving   Acute gout flare -Continue with colchicine and hold his allopurinol -Given his significant amount of pain that he is still having we will start him on 60 mg of p.o. prednisone with close monitoring of his blood glucose -Continue under pain control with acetaminophen and hydromorphone 0.5 mg every 2 as needed severe pain   Diabetes mellitus type 2 -Patient takes a Humalog mix 7030 at home but will substitute NovoLog 70/30 here and start him off on 55 units twice daily and continue sensitive NovoLog sliding scale -CBGs ranging from 162-215 -Continue to monitor blood sugars carefully and adjust insulin regimen as necessary   Hyperlipidemia -Continue with atorvastatin 80 mg p.o. daily   CKD stage IIIb -Patient's BUNs/creatinine has now gone from 26/1.6 and is now 31/1.81 -Avoid further nephrotoxic medications, contrast dyes, hypotension and dehydration to ensure adequate renal perfusion and renally adjust medications -Repeat CMP in a.m.   Normocytic Anemia -Patient is hemoglobin/hematocrit went from 11.3/34.2 and is now 11.3/34.3 -Check Anemia panel in a.m. -Continue to monitor for signs and symptoms of bleeding; no overt bleeding noted -Repeat CBC in the a.m.   Hyponatremia -Sodium was mildly low with a sodium of 134 and is now improved to 136 -Continue IV fluid hydration as above now  reduced to LR at 75 mils per hour for 12 more hours   Morbid Obesity -Complicates overall prognosis and care -Estimated body mass index is 42.01 kg/m as calculated from the following:   Height as of this encounter: 6' 3" (1.905 m).   Weight as of this  encounter: 152.5 kg.  -Weight Loss and Dietary Counseling given  DVT prophylaxis: SCDs Start: 06/22/21 0247    Code Status: Full Code Family Communication: No family currently at bedside  Disposition Plan:  Level of care: Progressive Status is: Inpatient Remains inpatient appropriate because: Patient continues to have pain in his hand and his foot.  Podiatry is following and cannot essentially rule out osteomyelitis so he is remaining on antibiotics with IV ceftriaxone, IV vancomycin and IV Flagyl   Consultants:  Podiatry  Procedures:  MRI of the foot without contrast given his renal function  Antimicrobials:  Anti-infectives (From admission, onward)    Start     Dose/Rate Route Frequency Ordered Stop   06/22/21 2200  vancomycin (VANCOREADY) IVPB 2000 mg/400 mL        2,000 mg 200 mL/hr over 120 Minutes Intravenous Every 24 hours 06/22/21 0307     06/22/21 2200  cefTRIAXone (ROCEPHIN) 2 g in sodium chloride 0.9 % 100 mL IVPB        2 g 200 mL/hr over 30 Minutes Intravenous Every 24 hours 06/22/21 1255 06/28/21 2159   06/22/21 0045  cefTRIAXone (ROCEPHIN) 2 g in sodium chloride 0.9 % 100 mL IVPB  Status:  Discontinued        2 g 200 mL/hr over 30 Minutes Intravenous Every 24 hours 06/22/21 0039 06/22/21 1255   06/22/21 0045  metroNIDAZOLE (FLAGYL) tablet 500 mg        500 mg Oral Every 12 hours 06/22/21 0039 06/28/21 2159   06/22/21 0045  vancomycin (VANCOREADY) IVPB 2000 mg/400 mL        2,000 mg 200 mL/hr over 120 Minutes Intravenous  Once 06/22/21 0041 06/22/21 0508       Subjective: Seen and examined at bedside and states that he is still having pain in his foot and his hand.  Has not noticed any drainage from his left foot today.  Denies any nausea or vomiting.  States that he takes Humalog 70/30 at home so we will resume him on the equivalent here in the hospital given that he is allergic to Levemir.  No chest pain or shortness of breath.  No other concerns or complaints  at this time.  Objective: Vitals:   06/23/21 0057 06/23/21 0500 06/23/21 0557 06/23/21 1434  BP: 135/86  (!) 141/85 (!) 142/97  Pulse: 98  (!) 106 92  Resp: _0 Temp: 98.9 F (37.2 C)  99 F (37.2 C) 98.9 F (37.2 C)  TempSrc: Oral   Oral  SpO2: 97%  94% 97%  Weight:  (!) 152.4 kg    Height:        Intake/Output Summary (Last 24 hours) at 06/23/2021 1725 Last data filed at 06/23/2021 1439 Gross per 24 hour  Intake 2058.95 ml  Output --  Net 2058.95 ml   Filed Weights   06/22/21 1308 06/23/21 0500  Weight: (!) 152.5 kg (!) 152.4 kg   Examination: Physical Exam:  Constitutional: WN/WD morbidly obese and recommended male currently in no acute distress Respiratory: Diminished to auscultation bilaterally with coarse breath sounds, no wheezing, rales, rhonchi or crackles. Normal respiratory effort and patient is not tachypenic. No  accessory muscle use.  Unlabored breathing Cardiovascular: RRR, no murmurs / rubs / gallops. S1 and S2 auscultated.  Abdomen: Soft, non-tender, distended secondary body habitus. Bowel sounds positive.  GU: Deferred. Musculoskeletal: No clubbing / cyanosis of digits/nails.  Right foot is wrapped.  Has some slight swelling in his left hand Skin: No rashes, lesions, ulcers on limited skin evaluation. No induration; Warm and dry.  Neurologic: CN 2-12 grossly intact with no focal deficits. Romberg sign and cerebellar reflexes not assessed.  Psychiatric: Normal judgment and insight. Alert and oriented x 3. Normal mood and appropriate affect.   Data Reviewed: I have personally reviewed following labs and imaging studies  CBC: Recent Labs  Lab 06/21/21 2325 06/22/21 0615 06/23/21 0459  WBC 12.1* 12.6* 10.2  NEUTROABS 8.2* 8.5* 6.3  HGB 12.1* 11.3* 11.3*  HCT 35.9* 34.2* 34.3*  MCV 91.1 91.4 92.0  PLT 344 324 272   Basic Metabolic Panel: Recent Labs  Lab 06/21/21 2325 06/22/21 0615 06/23/21 0459  NA 136 134* 136  K 4.2 4.4 4.3  CL 105  103 105  CO2 _0 GLUCOSE 134* 147* 171*  BUN 26* 26* 31*  CREATININE 1.83* 1.67* 1.81*  CALCIUM 8.6* 8.4* 8.8*  MG  --  1.8 1.9  PHOS  --   --  3.4   GFR: Estimated Creatinine Clearance: 83.1 mL/min (A) (by C-G formula based on SCr of 1.81 mg/dL (H)). Liver Function Tests: Recent Labs  Lab 06/21/21 2325 06/22/21 0615 06/23/21 0459  AST 13* 12* 14*  ALT _1 ALKPHOS 53 50 55  BILITOT 0.8 0.8 0.7  PROT 7.9 7.2 7.2  ALBUMIN 3.0* 2.8* 2.7*   No results for input(s): LIPASE, AMYLASE in the last 168 hours. No results for input(s): AMMONIA in the last 168 hours. Coagulation Profile: Recent Labs  Lab 06/21/21 2325  INR 1.0   Cardiac Enzymes: No results for input(s): CKTOTAL, CKMB, CKMBINDEX, TROPONINI in the last 168 hours. BNP (last 3 results) No results for input(s): PROBNP in the last 8760 hours. HbA1C: No results for input(s): HGBA1C in the last 72 hours. CBG: Recent Labs  Lab 06/22/21 1257 06/22/21 1752 06/23/21 0054 06/23/21 0554 06/23/21 1128  GLUCAP 166* 173* 162* 178* 215*   Lipid Profile: No results for input(s): CHOL, HDL, LDLCALC, TRIG, CHOLHDL, LDLDIRECT in the last 72 hours. Thyroid Function Tests: No results for input(s): TSH, T4TOTAL, FREET4, T3FREE, THYROIDAB in the last 72 hours. Anemia Panel: No results for input(s): VITAMINB12, FOLATE, FERRITIN, TIBC, IRON, RETICCTPCT in the last 72 hours. Sepsis Labs: Recent Labs  Lab 06/21/21 2325 06/22/21 0615  LATICACIDVEN 1.1 0.8    Recent Results (from the past 240 hour(s))  Blood Culture (routine x 2)     Status: None (Preliminary result)   Collection Time: 06/21/21 11:25 PM   Specimen: BLOOD  Result Value Ref Range Status   Specimen Description   Final    BLOOD RIGHT ANTECUBITAL Performed at [facility] [address] [place], [place] [address]    Special Requests   Final    BOTTLES DRAWN AEROBIC AND ANAEROBIC Blood Culture results may not be optimal due to an  excessive volume of blood received in culture bottles Performed at [facility] [address], [place], [place] [address]    Culture   Final    NO GROWTH 1 DAY Performed at [facility], [place] [address] [place], [place] [address]    Report  Status PENDING  Incomplete  MRSA Next Gen by PCR, Nasal     Status: None   Collection Time: 06/22/21  1:38 PM   Specimen: Nasal Mucosa; Nasal Swab  Result Value Ref Range Status   MRSA by PCR Next Gen NOT DETECTED NOT DETECTED Final    Comment: (NOTE) The GeneXpert MRSA Assay (FDA approved for NASAL specimens only), is one component of a comprehensive MRSA colonization surveillance program. It is not intended to diagnose MRSA infection nor to guide or monitor treatment for MRSA infections. Test performance is not FDA approved in patients less than 87 years old. Performed at [facility], [place] [address] [place], [place] [address]     Radiology Studies: DG Elbow Complete Left  Result Date: 06/22/2021 CLINICAL DATA:  Left elbow pain with swelling EXAM: LEFT ELBOW - COMPLETE 3+ VIEW COMPARISON:  None Available. FINDINGS: No fracture or malalignment. Mild degenerative changes on the ulnar side of the elbow. No significant elbow effusion IMPRESSION: No acute osseous abnormality Electronically Signed   By: Donavan Foil M.D.   On: 06/22/2021 00:16   MR FOOT LEFT WO CONTRAST  Result Date: 06/22/2021 CLINICAL DATA:  Foot pain.  Fifth ray resection on 06/10/2021 EXAM: MRI OF THE LEFT FOOT WITHOUT CONTRAST TECHNIQUE: Multiplanar, multisequence MR imaging of the left forefoot was performed. No intravenous contrast was administered. COMPARISON:  X-ray 06/21/2021, MRI 06/09/2021 FINDINGS: Bones/Joint/Cartilage Interval transmetatarsal amputation of the fifth ray. Mild bone marrow edema within the residual fifth metatarsal with preserved T1 bone marrow signal and no evidence of erosive change along the resection margin.  There is also mild bone marrow edema along the base of the fourth toe proximal phalanx as well as faint marrow edema within the fourth metatarsal head. Preserved T1 bone marrow signal at these locations. Similar degree of degenerative changes of the forefoot with moderate to severe first MTP joint arthropathy. No acute fracture or dislocation. Ligaments Intact Lisfranc ligament. No evidence of acute collateral ligament injury. Muscles and Tendons Denervation changes of the foot musculature with amputation changes along the lateral forefoot. No significant tenosynovial fluid collection. Soft tissues Generalized soft tissue swelling.  No organized fluid collection. IMPRESSION: 1. Interval transmetatarsal amputation of the fifth ray. Mild bone marrow edema within the residual fifth metatarsal with preserved T1 bone marrow signal and no evidence of erosive change along the resection margin. Appearance is favored to reflect postsurgical change, although early changes of acute osteomyelitis would be difficult to exclude by imaging. 2. There is also mild bone marrow edema along the base of the fourth toe proximal phalanx as well as faint marrow edema within the fourth metatarsal head. Preserved T1 bone marrow signal at these locations. Findings are may represent reactive osteitis versus stress related changes. Electronically Signed   By: Davina Poke D.O.   On: 06/22/2021 17:18   DG Chest Port 1 View  Result Date: 06/22/2021 CLINICAL DATA:  Right flank pain and fever EXAM: PORTABLE CHEST 1 VIEW COMPARISON:  05/24/2021 FINDINGS: The heart size and mediastinal contours are within normal limits. Both lungs are clear. The visualized skeletal structures are unremarkable. IMPRESSION: No active disease. Electronically Signed   By: Donavan Foil M.D.   On: 06/22/2021 00:15   DG Hand Complete Left  Result Date: 06/22/2021 CLINICAL DATA:  Finger pain EXAM: LEFT HAND - COMPLETE 3+ VIEW COMPARISON:  None Available. FINDINGS:  No acute displaced fracture or malalignment. Old fifth metacarpal fracture. Degenerative changes at the D IP joints.  Soft tissues are unremarkable IMPRESSION: 1. No acute osseous abnormality 2. Old fifth metacarpal fracture Electronically Signed   By: Donavan Foil M.D.   On: 06/22/2021 00:22   DG Foot Complete Left  Result Date: 06/22/2021 CLINICAL DATA:  Possible sepsis recent foot amputation EXAM: LEFT FOOT - COMPLETE 3+ VIEW COMPARISON:  06/10/2021 FINDINGS: Patient is status post transmetatarsal amputation of the fifth digit. Cut margins remain smooth. No osseous destructive change. Cutaneous staples remain in place. Degenerative changes at the first MTP joint. IMPRESSION: Stable transmetatarsal amputation of the fifth digit without acute osseous abnormality Electronically Signed   By: Donavan Foil M.D.   On: 06/22/2021 00:21   DG Foot Complete Right  Result Date: 06/22/2021 CLINICAL DATA:  History of diabetes.  Swelling of the foot. EXAM: RIGHT FOOT COMPLETE - 3+ VIEW COMPARISON:  11/02/2020 FINDINGS: There is been segmental amputation of the great toe. Distal phalanx and proximal aspect of the proximal phalanx remain. There are no destructive changes to suggest interval osteomyelitis. Regional soft tissue swelling is present however. No other finding. IMPRESSION: Partial amputation of the great toe as above. Regional soft tissue swelling. No additional bone destruction or irregularity to suggest ongoing osteomyelitis by radiography. Electronically Signed   By: Nelson Chimes M.D.   On: 06/22/2021 19:20   CT Renal Stone Study  Result Date: 06/22/2021 CLINICAL DATA:  Right flank pain EXAM: CT ABDOMEN AND PELVIS WITHOUT CONTRAST TECHNIQUE: Multidetector CT imaging of the abdomen and pelvis was performed following the standard protocol without IV contrast. RADIATION DOSE REDUCTION: This exam was performed according to the departmental dose-optimization program which includes automated exposure control,  adjustment of the mA and/or kV according to patient size and/or use of iterative reconstruction technique. COMPARISON:  Renal ultrasound 05/18/2019, CT 04/08/2016 FINDINGS: Lower chest: Mild right lower lobe bronchiectasis and round atelectasis and scarring as before. No acute airspace disease. Hepatobiliary: Hepatic steatosis. Gallstones. No biliary dilatation. Pancreas: Unremarkable. No pancreatic ductal dilatation or surrounding inflammatory changes. Spleen: Normal in size without focal abnormality. Adrenals/Urinary Tract: Adrenal glands are unremarkable. Kidneys are normal, without renal calculi, focal lesion, or hydronephrosis. Bladder is unremarkable. Stomach/Bowel: Stomach is within normal limits. Appendix appears normal. No evidence of bowel wall thickening, distention, or inflammatory changes. Vascular/Lymphatic: Nonaneurysmal aorta. Borderline retroperitoneal lymph nodes measuring up to 16 mm but grossly stable. Reproductive: Prostate is unremarkable. Other: Negative for pelvic effusion or free air Musculoskeletal: No acute osseous abnormality IMPRESSION: 1. Negative for hydronephrosis or ureteral stone 2. Hepatic steatosis 3. Chronic bronchiectasis and round atelectasis at the right base 4. Gallstones without right upper quadrant inflammation Electronically Signed   By: Donavan Foil M.D.   On: 06/22/2021 01:51     Scheduled Meds:  atorvastatin  80 mg Oral Daily   colchicine  1.2 mg Oral Daily   insulin aspart  0-9 Units Subcutaneous Q6H   insulin aspart protamine- aspart  55 Units Subcutaneous BID WC   metroNIDAZOLE  500 mg Oral Q12H   predniSONE  60 mg Oral Q breakfast   Continuous Infusions:  cefTRIAXone (ROCEPHIN)  IV 2 g (06/22/21 2304)   lactated ringers 75 mL/hr at 06/23/21 0944   vancomycin 2,000 mg (06/22/21 2045)    LOS: 1 day   Raiford Noble, DO [facility] Available via Epic secure chat 7am-7pm After these hours, please refer to coverage provider listed on  [email or web address] 06/23/2021, 5:25 PM

## 2021-06-24 DIAGNOSIS — E785 Hyperlipidemia, unspecified: Secondary | ICD-10-CM | POA: Diagnosis not present

## 2021-06-24 DIAGNOSIS — M1000 Idiopathic gout, unspecified site: Secondary | ICD-10-CM | POA: Diagnosis not present

## 2021-06-24 DIAGNOSIS — N1832 Chronic kidney disease, stage 3b: Secondary | ICD-10-CM | POA: Diagnosis not present

## 2021-06-24 DIAGNOSIS — L03116 Cellulitis of left lower limb: Secondary | ICD-10-CM | POA: Diagnosis not present

## 2021-06-24 LAB — CBC WITH DIFFERENTIAL/PLATELET
Abs Immature Granulocytes: 0.08 10*3/uL — ABNORMAL HIGH (ref 0.00–0.07)
Basophils Absolute: 0 10*3/uL (ref 0.0–0.1)
Basophils Relative: 0 %
Eosinophils Absolute: 0 10*3/uL (ref 0.0–0.5)
Eosinophils Relative: 0 %
HCT: 34.5 % — ABNORMAL LOW (ref 39.0–52.0)
Hemoglobin: 11.6 g/dL — ABNORMAL LOW (ref 13.0–17.0)
Immature Granulocytes: 1 %
Lymphocytes Relative: 7 %
Lymphs Abs: 1 10*3/uL (ref 0.7–4.0)
MCH: 30.4 pg (ref 26.0–34.0)
MCHC: 33.6 g/dL (ref 30.0–36.0)
MCV: 90.3 fL (ref 80.0–100.0)
Monocytes Absolute: 1.2 10*3/uL — ABNORMAL HIGH (ref 0.1–1.0)
Monocytes Relative: 9 %
Neutro Abs: 11.9 10*3/uL — ABNORMAL HIGH (ref 1.7–7.7)
Neutrophils Relative %: 83 %
Platelets: 359 10*3/uL (ref 150–400)
RBC: 3.82 MIL/uL — ABNORMAL LOW (ref 4.22–5.81)
RDW: 13 % (ref 11.5–15.5)
WBC: 14.2 10*3/uL — ABNORMAL HIGH (ref 4.0–10.5)
nRBC: 0 % (ref 0.0–0.2)

## 2021-06-24 LAB — COMPREHENSIVE METABOLIC PANEL
ALT: 16 U/L (ref 0–44)
AST: 19 U/L (ref 15–41)
Albumin: 2.6 g/dL — ABNORMAL LOW (ref 3.5–5.0)
Alkaline Phosphatase: 57 U/L (ref 38–126)
Anion gap: 10 (ref 5–15)
BUN: 40 mg/dL — ABNORMAL HIGH (ref 6–20)
CO2: 22 mmol/L (ref 22–32)
Calcium: 8.9 mg/dL (ref 8.9–10.3)
Chloride: 104 mmol/L (ref 98–111)
Creatinine, Ser: 2.05 mg/dL — ABNORMAL HIGH (ref 0.61–1.24)
GFR, Estimated: 40 mL/min — ABNORMAL LOW (ref >60)
Glucose, Bld: 320 mg/dL — ABNORMAL HIGH (ref 70–99)
Potassium: 4.9 mmol/L (ref 3.5–5.1)
Sodium: 136 mmol/L (ref 135–145)
Total Bilirubin: 0.4 mg/dL (ref 0.3–1.2)
Total Protein: 7.5 g/dL (ref 6.5–8.1)

## 2021-06-24 LAB — GLUCOSE, CAPILLARY
Glucose-Capillary: 289 mg/dL — ABNORMAL HIGH (ref 70–99)
Glucose-Capillary: 308 mg/dL — ABNORMAL HIGH (ref 70–99)
Glucose-Capillary: 315 mg/dL — ABNORMAL HIGH (ref 70–99)

## 2021-06-24 LAB — URIC ACID: Uric Acid, Serum: 6 mg/dL (ref 3.7–8.6)

## 2021-06-24 LAB — MAGNESIUM: Magnesium: 2.1 mg/dL (ref 1.7–2.4)

## 2021-06-24 LAB — PHOSPHORUS: Phosphorus: 3.2 mg/dL (ref 2.5–4.6)

## 2021-06-24 MED ORDER — HYDRALAZINE HCL 20 MG/ML IJ SOLN: 10.0000 mg | Freq: Four times a day (QID) | INTRAMUSCULAR | Status: DC | PRN

## 2021-06-24 MED ORDER — FUROSEMIDE 20 MG PO TABS
ORAL_TABLET | ORAL | Status: AC
  Filled 2021-06-24: qty 1

## 2021-06-24 MED ORDER — FUROSEMIDE 20 MG PO TABS
20.0000 mg | ORAL_TABLET | Freq: Every day | ORAL | Status: DC
  Administered 2021-06-24: 20 mg via ORAL
  Filled 2021-06-24: qty 1

## 2021-06-24 MED ORDER — INSULIN ASPART PROT & ASPART (70-30 MIX) 100 UNIT/ML ~~LOC~~ SUSP
45.0000 [IU] | Freq: Once | SUBCUTANEOUS | Status: AC
Start: 2021-06-24 — End: 2021-06-24
  Administered 2021-06-24: 45 [IU] via SUBCUTANEOUS

## 2021-06-24 MED ORDER — INSULIN ASPART PROT & ASPART (70-30 MIX) 100 UNIT/ML ~~LOC~~ SUSP
100.0000 [IU] | Freq: Two times a day (BID) | SUBCUTANEOUS | Status: DC
  Administered 2021-06-24 – 2021-06-26 (×4): 100 [IU] via SUBCUTANEOUS

## 2021-06-24 NOTE — Progress Notes (Signed)
   Subjective:  Patient presents today status post partial fifth ray amputation of the left foot performed inpatient. DOS: 06/10/2021.  Patient states that he is doing well.  Pain is controlled with the pain medication.  He has been partial minimal weightbearing heel touch only in the cam boot.  He is also taking his antibiotics as prescribed.  No new complaints at this time  Past Medical History:  Diagnosis Date   Carpal tunnel syndrome    Diabetes mellitus without complication (HCC)    Gout    Hypercholesteremia    Hypothyroidism    Neuropathy    Obesity    Thyroid disease      Past Surgical History:  Procedure Laterality Date   AMPUTATION Left 06/10/2021   Procedure: 5th RAY AMPUTATION LEFT FOOT;  Surgeon: [name], DPM;  Location: [facility];  Service: Podiatry;  Laterality: Left;   FRACTURE SURGERY     INCISE AND DRAIN ABCESS     IRRIGATION AND DEBRIDEMENT FOOT Right 04/27/2019   Procedure: Debridement and Irrigation with possible placement of antibiotic beads;  Surgeon: Park Liter, DPM;  Location: [facility];  Service: Podiatry;  Laterality: Right;   ORIF TOE FRACTURE Right 04/27/2019   Procedure: Open Reduction vs Excision of Fracture Fragment Right great toe, with pinning.;  Surgeon: Park Liter, DPM;  Location: [facility];  Service: Podiatry;  Laterality: Right;   TONSILLECTOMY     No Known Allergies  Objective/Physical Exam Skin incisions do appear well coapted the sutures and staples are intact.  No malodor.  There is only some very slight drainage at the distal portion of the incision site.  There are some residual edema as well.  No significant erythema.  Assessment: 1. s/p partial fifth ray amputation left foot. DOS: 06/10/2021   Plan of Care:  1. Patient was evaluated.  2.  Dressings were changed today.  Betadine wet-to-dry dressings applied.  Keep clean dry and intact x1 week 3.  Continue antibiotics that were prescribed at discharge from the hospital 4.  Refill  prescription for pain medication Percocet 5/3 2 5  mg 5.  Return to clinic in 1 week for follow-up x-ray   [name], DPM [facility]  Dr. [name], DPM    [address] [place], [place] [address]                Office [phone]  Fax [phone]

## 2021-06-24 NOTE — Progress Notes (Signed)
  Subjective:  Patient ID: Barry [name], male    DOB: 1975-02-01,  MRN: [id number]  Says that he is doing okay his foot is starting to feel little bit better.  Hands are still painful from gout  Negative for chest pain and shortness of breath Chest pain: no Shortness of breath: no Fever: no Night sweats: no Objective:   Vitals:   06/24/21 0533 06/24/21 1303  BP: (!) 146/94 (!) 141/81  Pulse: 100 89  Resp: 15   Temp: 97.6 F (36.4 C) 98.2 F (36.8 C)  SpO2: 97% 96%   General AA&O x3. Normal mood and affect.  Vascular Dorsalis pedis and posterior tibial pulses 2/4 bilat. Brisk capillary refill to all digits. Pedal hair present.  Neurologic Epicritic sensation grossly intact.  Dermatologic Incision remains well coapted, there is no drainage there is an area of delayed healing distally, no signs of purulence or malodor or infection  Orthopedic: MMT 5/5 in dorsiflexion, plantarflexion, inversion, and eversion. Normal joint ROM without pain or crepitus.     Assessment & Plan:  Patient was evaluated and treated and all questions answered.  Status post partial fifth ray resection 06/10/2021 -Incision remains well coapted.  There is minimal drainage.  I do not see evidence that the foot is acutely infected -I think his inflammatory markers and leukocytosis is likely more related to gout.  His uric acid has decreased now that he has been on colchicine and he says he is starting to feel little bit better for this.  Regardless I would continue with antibiotics given high risk of further limb loss if this fails -From our standpoint once he is further improved with his gout he should be stable for discharge and we will follow-up with him closely in clinic next week  Edwin Cap, DPM  Accessible via secure chat for questions or concerns.

## 2021-06-24 NOTE — Progress Notes (Signed)
  Transition of Care [facility]) Screening Note   Patient Details  Name: Barry [name] Date of Birth: September 01, 1975   Transition of Care [facility]) CM/SW Contact:    Dessa Phi, RN Phone Number: 06/24/2021, 10:48 AM    Transition of Care Department [facility]) has reviewed patient and no TOC needs have been identified at this time. We will continue to monitor patient advancement through interdisciplinary progression rounds. If new patient transition needs arise, please place a TOC consult.

## 2021-06-24 NOTE — Progress Notes (Incomplete)
Introduction: Barry [name] is a 46 YO M who presented with the cc of fever and cellulitis on June 2nd  History of present illness: GM was recently hospitalized for osteomyelitis of the left fifth toe which was subsequently amputated. Culture from that was MRSA positive (was this a biopsy or swab? skin swab not super helpful for MRSA). Pt DCed home with doxy and augmentin but noted increases foot inflammation/drainage associated with fever. Pt's OP podiatrist recommended an ER visit.  Past medical history/allergies/family history pertinent to current illness: PMH: DMT2, gout, hypothyroidism, hypercholesteremia, Stage IIIb CKD  FH:  NKDA  PTA meds: Amox 875/clav 125 mg (augmentin) 1 tab BID x 10 days started May 24  Gentamicin ointment 0.1%  DMT2 meds   Social hx: No hx of smoking or illicit drug use. Reports alcohol use, could not find details.   Physical exam/relevant labs, diagnostic testing/imaging: MRSA (would culture) was detected on May 22. Bcx have been negative. There is concern for osteomyelitis but pt is currently being tx for cellulitis with broad spectrum abx.  Current regimen:  Ceftriaxone 2 g q 24 h (3rd gen), appropriate for osteomyelitis will be on for 6+ weeks, dose is ok for CrCl) Metronidazole 500 mg q12h (no indication for osteomyelitis but dose regimen seems reasonable comaparitvely) Vancomycin 2000 mg q24h scr went up from 1.81 yesterday to 2.05 which is within baseline for this py but a bit high, this dose is still ideal, gives AUC of 521. Keep an eye on this because of his kidney function.    Problem based assessment and plan:  - Problem 1:   A. Assessment:  B. Plan:  - Problem 2:  A. Assessment:  B. Plan:  - Problem 3:  A. Assessment:  B. Plan:

## 2021-06-24 NOTE — Progress Notes (Signed)
PROGRESS NOTE    Barry [name]  IPJ:[id number] DOB: 02-Oct-1975 DOA: 06/21/2021 PCP: Maryella Shivers, MD   Brief Narrative:  The Patient is a 46 year old morbidly obese African-American male with past medical history significant for but limited to diabetes mellitus type 2, gout, hyperlipidemia, stage IIIb chronic kidney disease with a creatinine ranging from 1.6-2.1 at baseline as well as other comorbidities who presented to the hospital with foot pain and hand pain as well as a subjective fever.  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  He was initially discharged on doxycycline that he developed worsening left foot swelling, erythema, discomfort and increased warmth as well as development of purulent drainage for the last 2 to 3 days associated with subjective fever as well as chills, full body rigors and myalgias.   Patient states that anytime he is surgery he has gout flares and he feels like he has a flare of gout in his left hand.  He presented to the ED and he was admitted for sepsis secondary to cellulitis with concern for osteomyelitis and I ordered an MRI of his foot given the warmth and erythema.  Podiatry has been consulted for further evaluation and currently his MRI is still pending.  He is started on broad-spectrum antibiotics which we will continue for now and de-escalate as accordingly but we will also have ID evaluate.   Assessment and Plan:  Sepsis secondary to left foot cellulitis/surgical site infection with concern for possible underlying osteomyelitis at the surgical site  -Obtaining MRI of the foot and continuing LR at 100 mils per hour and reduce the rate to 75 MLS per hour for 12 more hours -Initiated on antibiotics with vancomycin, and ceftriaxone and will continue along with IV  Flagyl -WBC went from 12.1 -> 12.6 and is now 10.2 but is now trended back up to 14.2 in the setting of steroid demargination -LA went from 1.1 -> 0.8 -Inflammatory markers are elevated and ESR was 149 CRP was 19.2 -Podiatry evaluated and MRI of the foot was done and cannot entirely rule out osteomyelitis and findings were reviewed but podiatry feels that clinically less likely osteomyelitis but recommends continue to monitor -We will consult ID for further evaluation recommendations -Sepsis physiology is improving   Acute gout flare -Continue with colchicine and hold his allopurinol -Given his significant amount of pain that he is still having we will start him on 60 mg of p.o. prednisone with close monitoring of his blood glucose -Uric acid level today was 6.0 but he continues to have some left hand pain and continues to persist may need further hand imaging -Continue under pain control with acetaminophen and hydromorphone 0.5 mg every 2 as needed severe pain -May need to consult Ortho if still persisting   Diabetes Mellitus Type 2 -Patient takes a Humalog mix 7030 at home but will substitute NovoLog 70/30 here and start him off on 55 units twice daily  but will go back up to 100 units BID and continue sensitive NovoLog sliding scale -CBGs ranging from 178-388 -Continue to monitor blood sugars carefully and adjust insulin regimen as necessary -Likely will consult diabetes education coordinator for further evaluation  Hypertension -Holding his ARB for now given his renal dysfunction -Resume home Lasix 20 mg p.o. daily given his slight volume overload -We will add IV hydralazine  as needed -Continue monitor blood pressures per protocol -Last blood pressure reading was 141/81   Hyperlipidemia -Continue with atorvastatin 80 mg p.o. daily   CKD stage IIIb -Patient's BUNs/creatinine has now gone from 26/1.6 -> 31/1.81 -> 40/2.05 -Avoid further nephrotoxic medications, contrast dyes,  hypotension and dehydration to ensure adequate renal perfusion and renally adjust medications -Repeat CMP in a.m.   Normocytic Anemia -Patient is hemoglobin/hematocrit went from 11.3/34.2 -> 11.3/34.3 -> 11.6/34.5  -Check Anemia panel in a.m. -Continue to monitor for signs and symptoms of bleeding; no overt bleeding noted -Repeat CBC in the a.m.   Hyponatremia -Sodium was mildly low with a sodium of 134 and is now improved to 136 again -IVF now stopped and given slight volume overload and started Diuresis   Hypoalbuminemia -Patient's albumin level went from 2.7 is now 2.6 -Continue monitor and trend and repeat CMP in the a.m.   Morbid Obesity -Complicates overall prognosis and care -Estimated body mass index is 41.88 kg/m as calculated from the following:   Height as of this encounter: 6' 3"  (1.905 m).   Weight as of this encounter: 152 kg.  -Weight Loss and Dietary Counseling given  DVT prophylaxis: SCDs Start: 06/22/21 0247    Code Status: Full Code Family Communication: No family currently at bedside  Disposition Plan:  Level of care: Progressive Status is: Inpatient Remains inpatient appropriate because: Continues to have some hand pain and continues on antibiotics.  We will need to further delineate podiatry plan and will consult ID for further evaluation given recent osteomyelitis   Consultants:  Podiatry We will consult ID  Procedures:  MRI of the foot  Antimicrobials:  Anti-infectives (From admission, onward)    Start     Dose/Rate Route Frequency Ordered Stop   06/22/21 2200  vancomycin (VANCOREADY) IVPB 2000 mg/400 mL        2,000 mg 200 mL/hr over 120 Minutes Intravenous Every 24 hours 06/22/21 0307     06/22/21 2200  cefTRIAXone (ROCEPHIN) 2 g in sodium chloride 0.9 % 100 mL IVPB        2 g 200 mL/hr over 30 Minutes Intravenous Every 24 hours 06/22/21 1255 06/28/21 2159   06/22/21 0045  cefTRIAXone (ROCEPHIN) 2 g in sodium chloride 0.9 % 100 mL IVPB   Status:  Discontinued        2 g 200 mL/hr over 30 Minutes Intravenous Every 24 hours 06/22/21 0039 06/22/21 1255   06/22/21 0045  metroNIDAZOLE (FLAGYL) tablet 500 mg        500 mg Oral Every 12 hours 06/22/21 0039 06/28/21 2159   06/22/21 0045  vancomycin (VANCOREADY) IVPB 2000 mg/400 mL        2,000 mg 200 mL/hr over 120 Minutes Intravenous  Once 06/22/21 0041 06/22/21 0508       Subjective: Seen and examined at bedside and he thinks his legs are little bit more swollen.  Hand is still hurting but thinks that the pain is getting little bit better.  Felt a little sleepy this morning.  No nausea or vomiting.  Blood sugars remain elevated now so we will increase his dosage to his home dose and continue monitor carefully.  He denies any other concerns or complaints at this time but his main complaint is his left hand pain more than his foot pain.  Objective: Vitals:   06/23/21 2038 06/24/21 0500 06/24/21 0533 06/24/21 1303  BP: (!) 147/89  (!) 146/94 (!) 141/81  Pulse: 99  100 89  Resp: 20  15   Temp: 97.8 F (36.6 C)  97.6 F (36.4 C) 98.2 F (36.8 C)  TempSrc: Oral  Oral Oral  SpO2: 93%  97% 96%  Weight:  (!) 152 kg    Height:        Intake/Output Summary (Last 24 hours) at 06/24/2021 1639 Last data filed at 06/24/2021 0900 Gross per 24 hour  Intake 1183.1 ml  Output --  Net 1183.1 ml   Filed Weights   06/22/21 1308 06/23/21 0500 06/24/21 0500  Weight: (!) 152.5 kg (!) 152.4 kg (!) 152 kg   Examination: Physical Exam:  Constitutional: WN/WD morbidly obese African-American male, in no acute distress Respiratory: Diminished to auscultation bilaterally, no wheezing, rales, rhonchi or crackles. Normal respiratory effort and patient is not tachypenic. No accessory muscle use.  Unlabored breathing Cardiovascular: RRR, no murmurs / rubs / gallops. S1 and S2 auscultated.  Has 1+ lower extremity edema which is mild Abdomen: Soft, non-tender, distended second by habitus. Bowel  sounds positive.  GU: Deferred. Musculoskeletal: No clubbing / cyanosis of digits/nails. No joint deformity upper and lower extremities.  Left foot is wrapped and he is having some hand swelling on his left Skin: No rashes, lesions, ulcers. No induration; Warm and dry.  Neurologic: CN 2-12 grossly intact with no focal deficits.Romberg sign and cerebellar reflexes not assessed.  Psychiatric: Normal judgment and insight. Alert and oriented x 3. Normal mood and appropriate affect.   Data Reviewed: I have personally reviewed following labs and imaging studies  CBC: Recent Labs  Lab 06/21/21 2325 06/22/21 0615 06/23/21 0459 06/24/21 0444  WBC 12.1* 12.6* 10.2 14.2*  NEUTROABS 8.2* 8.5* 6.3 11.9*  HGB 12.1* 11.3* 11.3* 11.6*  HCT 35.9* 34.2* 34.3* 34.5*  MCV 91.1 91.4 92.0 90.3  PLT 344 324 313 201   Basic Metabolic Panel: Recent Labs  Lab 06/21/21 2325 06/22/21 0615 06/23/21 0459 06/24/21 0444  NA 136 134* 136 136  K 4.2 4.4 4.3 4.9  CL 105 103 105 104  CO2 24 22 22 22   GLUCOSE 134* 147* 171* 320*  BUN 26* 26* 31* 40*  CREATININE 1.83* 1.67* 1.81* 2.05*  CALCIUM 8.6* 8.4* 8.8* 8.9  MG  --  1.8 1.9 2.1  PHOS  --   --  3.4 3.2   GFR: Estimated Creatinine Clearance: 73.3 mL/min (A) (by C-G formula based on SCr of 2.05 mg/dL (H)). Liver Function Tests: Recent Labs  Lab 06/21/21 2325 06/22/21 0615 06/23/21 0459 06/24/21 0444  AST 13* 12* 14* 19  ALT 15 14 13 16   ALKPHOS 53 50 55 57  BILITOT 0.8 0.8 0.7 0.4  PROT 7.9 7.2 7.2 7.5  ALBUMIN 3.0* 2.8* 2.7* 2.6*   No results for input(s): LIPASE, AMYLASE in the last 168 hours. No results for input(s): AMMONIA in the last 168 hours. Coagulation Profile: Recent Labs  Lab 06/21/21 2325  INR 1.0   Cardiac Enzymes: No results for input(s): CKTOTAL, CKMB, CKMBINDEX, TROPONINI in the last 168 hours. BNP (last 3 results) No results for input(s): PROBNP in the last 8760 hours. HbA1C: No results for input(s): HGBA1C in the  last 72 hours. CBG: Recent Labs  Lab 06/23/21 1128 06/23/21 1734 06/23/21 2354 06/24/21 0514 06/24/21 1127  GLUCAP 215* 308* 388* 289* 308*   Lipid Profile: No results for input(s): CHOL, HDL, LDLCALC, TRIG, CHOLHDL, LDLDIRECT in the last 72 hours. Thyroid Function Tests: No results for input(s): TSH, T4TOTAL, FREET4, T3FREE, THYROIDAB in the last 72 hours.  Anemia Panel: No results for input(s): VITAMINB12, FOLATE, FERRITIN, TIBC, IRON, RETICCTPCT in the last 72 hours. Sepsis Labs: Recent Labs  Lab 06/21/21 2325 06/22/21 0615  LATICACIDVEN 1.1 0.8    Recent Results (from the past 240 hour(s))  Blood Culture (routine x 2)     Status: None (Preliminary result)   Collection Time: 06/21/21 11:25 PM   Specimen: BLOOD  Result Value Ref Range Status   Specimen Description   Final    BLOOD RIGHT ANTECUBITAL Performed at [facility] [address], [place], [place] [address]    Special Requests   Final    BOTTLES DRAWN AEROBIC AND ANAEROBIC Blood Culture results may not be optimal due to an excessive volume of blood received in culture bottles Performed at [facility] [address], [place], [place] [address]    Culture   Final    NO GROWTH 2 DAYS Performed at [facility] [address], [place], [place] [address]    Report Status PENDING  Incomplete  MRSA Next Gen by PCR, Nasal     Status: None   Collection Time: 06/22/21  1:38 PM   Specimen: Nasal Mucosa; Nasal Swab  Result Value Ref Range Status   MRSA by PCR Next Gen NOT DETECTED NOT DETECTED Final    Comment: (NOTE) The GeneXpert MRSA Assay (FDA approved for NASAL specimens only), is one component of a comprehensive MRSA colonization surveillance program. It is not intended to diagnose MRSA infection nor to guide or monitor treatment for MRSA infections. Test performance is not FDA approved in patients less than 56 years old. Performed at [facility], [place] [address], [place], [place] [address]   Blood Culture (routine x 2)     Status: None (Preliminary result)   Collection Time: 06/23/21  4:59 AM   Specimen: BLOOD  Result Value Ref Range Status   Specimen Description   Final    BLOOD LEFT ANTECUBITAL Performed at [facility] [address] [place], [place] [address]    Special Requests   Final    BOTTLES DRAWN AEROBIC ONLY Blood Culture adequate volume Performed at [facility] [address] [place], [place] [address]    Culture   Final    NO GROWTH < 24 HOURS Performed at [facility] [address] [place], [place] [address]    Report Status PENDING  Incomplete     Radiology Studies: DG Foot Complete Right  Result Date: 06/22/2021 CLINICAL DATA:  History of diabetes.  Swelling of the foot. EXAM: RIGHT FOOT COMPLETE - 3+ VIEW COMPARISON:  11/02/2020 FINDINGS: There is been segmental amputation of the great toe. Distal phalanx and proximal aspect of the proximal phalanx remain. There are no destructive changes to suggest interval osteomyelitis. Regional soft tissue swelling is present however. No other finding. IMPRESSION: Partial amputation of the great toe as above. Regional soft tissue swelling. No additional bone destruction or irregularity to suggest ongoing osteomyelitis by radiography. Electronically Signed   By: Nelson Chimes M.D.   On: 06/22/2021 19:20     Scheduled Meds:  atorvastatin  80 mg Oral Daily   colchicine  1.2 mg Oral Daily   furosemide  20 mg Oral Daily   insulin aspart  0-9 Units Subcutaneous Q6H   insulin aspart protamine- aspart  100 Units Subcutaneous BID WC   metroNIDAZOLE  500 mg Oral Q12H   predniSONE  60 mg Oral  Q breakfast   Continuous Infusions:  cefTRIAXone (ROCEPHIN)  IV 2 g (06/23/21 2205)   vancomycin 2,000 mg (06/23/21 2001)    LOS: 2 days   Raiford Noble, DO [facility] Available via Epic secure chat 7am-7pm After  these hours, please refer to coverage provider listed on [email or web address] 06/24/2021, 4:39 PM

## 2021-06-25 ENCOUNTER — Other Ambulatory Visit (HOSPITAL_COMMUNITY): Payer: Self-pay

## 2021-06-25 DIAGNOSIS — N1831 Chronic kidney disease, stage 3a: Secondary | ICD-10-CM

## 2021-06-25 DIAGNOSIS — L03116 Cellulitis of left lower limb: Secondary | ICD-10-CM | POA: Diagnosis not present

## 2021-06-25 DIAGNOSIS — E785 Hyperlipidemia, unspecified: Secondary | ICD-10-CM | POA: Diagnosis not present

## 2021-06-25 DIAGNOSIS — N1832 Chronic kidney disease, stage 3b: Secondary | ICD-10-CM | POA: Diagnosis not present

## 2021-06-25 DIAGNOSIS — M1000 Idiopathic gout, unspecified site: Secondary | ICD-10-CM | POA: Diagnosis not present

## 2021-06-25 LAB — COMPREHENSIVE METABOLIC PANEL
ALT: 17 U/L (ref 0–44)
AST: 16 U/L (ref 15–41)
Albumin: 2.7 g/dL — ABNORMAL LOW (ref 3.5–5.0)
Alkaline Phosphatase: 59 U/L (ref 38–126)
Anion gap: 10 (ref 5–15)
BUN: 56 mg/dL — ABNORMAL HIGH (ref 6–20)
CO2: 19 mmol/L — ABNORMAL LOW (ref 22–32)
Calcium: 8.4 mg/dL — ABNORMAL LOW (ref 8.9–10.3)
Chloride: 102 mmol/L (ref 98–111)
Creatinine, Ser: 2.1 mg/dL — ABNORMAL HIGH (ref 0.61–1.24)
GFR, Estimated: 39 mL/min — ABNORMAL LOW (ref >60)
Glucose, Bld: 329 mg/dL — ABNORMAL HIGH (ref 70–99)
Potassium: 4.8 mmol/L (ref 3.5–5.1)
Sodium: 131 mmol/L — ABNORMAL LOW (ref 135–145)
Total Bilirubin: 0.4 mg/dL (ref 0.3–1.2)
Total Protein: 7.3 g/dL (ref 6.5–8.1)

## 2021-06-25 LAB — CBC WITH DIFFERENTIAL/PLATELET
Abs Immature Granulocytes: 0.09 10*3/uL — ABNORMAL HIGH (ref 0.00–0.07)
Basophils Absolute: 0 10*3/uL (ref 0.0–0.1)
Basophils Relative: 0 %
Eosinophils Absolute: 0 10*3/uL (ref 0.0–0.5)
Eosinophils Relative: 0 %
HCT: 34.2 % — ABNORMAL LOW (ref 39.0–52.0)
Hemoglobin: 11.4 g/dL — ABNORMAL LOW (ref 13.0–17.0)
Immature Granulocytes: 1 %
Lymphocytes Relative: 8 %
Lymphs Abs: 1.4 10*3/uL (ref 0.7–4.0)
MCH: 30.7 pg (ref 26.0–34.0)
MCHC: 33.3 g/dL (ref 30.0–36.0)
MCV: 92.2 fL (ref 80.0–100.0)
Monocytes Absolute: 1.3 10*3/uL — ABNORMAL HIGH (ref 0.1–1.0)
Monocytes Relative: 7 %
Neutro Abs: 14.9 10*3/uL — ABNORMAL HIGH (ref 1.7–7.7)
Neutrophils Relative %: 84 %
Platelets: 374 10*3/uL (ref 150–400)
RBC: 3.71 MIL/uL — ABNORMAL LOW (ref 4.22–5.81)
RDW: 13.3 % (ref 11.5–15.5)
WBC: 17.7 10*3/uL — ABNORMAL HIGH (ref 4.0–10.5)
nRBC: 0 % (ref 0.0–0.2)

## 2021-06-25 LAB — GLUCOSE, CAPILLARY
Glucose-Capillary: 245 mg/dL — ABNORMAL HIGH (ref 70–99)
Glucose-Capillary: 302 mg/dL — ABNORMAL HIGH (ref 70–99)
Glucose-Capillary: 331 mg/dL — ABNORMAL HIGH (ref 70–99)
Glucose-Capillary: 339 mg/dL — ABNORMAL HIGH (ref 70–99)
Glucose-Capillary: 347 mg/dL — ABNORMAL HIGH (ref 70–99)

## 2021-06-25 LAB — MAGNESIUM: Magnesium: 2.1 mg/dL (ref 1.7–2.4)

## 2021-06-25 LAB — PHOSPHORUS: Phosphorus: 3.3 mg/dL (ref 2.5–4.6)

## 2021-06-25 MED ORDER — LINEZOLID 600 MG PO TABS
600.0000 mg | ORAL_TABLET | Freq: Two times a day (BID) | ORAL | Status: DC
Start: 2021-06-25 — End: 2021-06-27
  Administered 2021-06-25 – 2021-06-27 (×4): 600 mg via ORAL
  Filled 2021-06-25 (×5): qty 1

## 2021-06-25 MED ORDER — FUROSEMIDE 10 MG/ML IJ SOLN
40.0000 mg | Freq: Once | INTRAMUSCULAR | Status: AC
  Administered 2021-06-25: 40 mg via INTRAVENOUS
  Filled 2021-06-25: qty 4

## 2021-06-25 MED ORDER — INSULIN ASPART 100 UNIT/ML IJ SOLN
0.0000 [IU] | Freq: Every day | INTRAMUSCULAR | Status: DC
  Administered 2021-06-25: 4 [IU] via SUBCUTANEOUS
  Administered 2021-06-26: 3 [IU] via SUBCUTANEOUS

## 2021-06-25 MED ORDER — VANCOMYCIN VARIABLE DOSE PER UNSTABLE RENAL FUNCTION (PHARMACIST DOSING): Status: DC

## 2021-06-25 MED ORDER — INSULIN ASPART 100 UNIT/ML IJ SOLN
0.0000 [IU] | Freq: Three times a day (TID) | INTRAMUSCULAR | Status: DC
  Administered 2021-06-25: 15 [IU] via SUBCUTANEOUS
  Administered 2021-06-25: 7 [IU] via SUBCUTANEOUS
  Administered 2021-06-26: 11 [IU] via SUBCUTANEOUS
  Administered 2021-06-26 (×2): 7 [IU] via SUBCUTANEOUS
  Administered 2021-06-27: 11 [IU] via SUBCUTANEOUS

## 2021-06-25 NOTE — Progress Notes (Addendum)
Inpatient Diabetes Program Recommendations  AACE/ADA: New Consensus Statement on Inpatient Glycemic Control (2015)  Target Ranges:  Prepandial:   less than 140 mg/dL      Peak postprandial:   less than 180 mg/dL (1-2 hours)      Critically ill patients:  140 - 180 mg/dL    Latest Reference Range & Units 06/24/21 05:14 06/24/21 11:27 06/24/21 17:05 06/25/21 00:03  Glucose-Capillary 70 - 99 mg/dL 259 (H)  5 units Novolog  55 units 70/30 Insulin @0818   308 (H)  7 units Novolog  45 units 70/30 Insulin @1047   315 (H)  7 units Novolog  100 units 70/30 Insulin  347 (H)  7 units Novolog     Latest Reference Range & Units 06/25/21 06:20 06/25/21 12:30  Glucose-Capillary 70 - 99 mg/dL 08/25/21 (H)  7 units Novolog  100 units 70/30 Insulin @0955  302 (H)      Home DM Meds: Huamlog 75/25 Insulin 100 units BID       Jardiance 25 mg daily       Ozempic 0.5 mg Qweek   Current Orders: 70/30 Insulin 100 units BID      Novolog Sensitive Correction Scale/ SSI (0-9 units) Q6 hours    MD- Note 70/30 Insulin increased to 100 units BID yesterday AM (home dose)  Remains on Prednisone 60 mg daily  Please consider increasing/adjusting the Novolog SSI to the Resistant 0-20 unit scale TID AC + HS  Please also Increase the 70/30 Insulin to 110 units BID    --Will follow patient during hospitalization--  08/25/21 RN, MSN, CDE Diabetes Coordinator Inpatient Glycemic Control Team Team Pager: [phone] (8a-5p)

## 2021-06-25 NOTE — Consult Note (Addendum)
[facility]       Reason for Consult: purulence of foot    Referring Physician: Dr. Marland Mcalpine  Principal Problem:   Cellulitis of left lower extremity Active Problems:   HLD (hyperlipidemia)   Stage 3b chronic kidney disease (CKD) (HCC)   DM2 (diabetes mellitus, type 2) (HCC)   Sepsis (HCC)   Gout flare    atorvastatin  80 mg Oral Daily   colchicine  1.2 mg Oral Daily   insulin aspart  0-20 Units Subcutaneous TID WC   insulin aspart  0-5 Units Subcutaneous QHS   insulin aspart protamine- aspart  100 Units Subcutaneous BID WC   metroNIDAZOLE  500 mg Oral Q12H   predniSONE  60 mg Oral Q breakfast   vancomycin variable dose per unstable renal function (pharmacist dosing)   Does not apply See admin instructions    Recommendations: Stop metronidazole and ceftriaxone Will change to linezolid now and stop vancomycin with rising creatinine Linezolid at discharge for 7 days  Assessment: He has some purulence of the wound site but no surrounding cellulitis.  It appears to be a superficial infection and will use current antibiotics as above and linezolid at discharge.  Osteomyelitis not evident on MRI.    Antibiotics: Vancomycin, ceftriaxone, metronidazole  HPI: Barry [name] is a 46 y.o. male with type 2 diabetes, gout, chronic kidney disease and recent left fifth toe transmetatarsal amputation here with left foot pain.  He underwent amputation on 06/10/21 for osteomyelitis by Dr. Logan Bores with deep wound culture also done and positive for MRSA.  He had been on doxycycline and Augmentin.  MRSA resistant to tetracycline.  He is currently being treated for gout.     Review of Systems:  Constitutional: negative for fevers and chills All other systems reviewed and are negative    Past Medical History:  Diagnosis Date   Carpal tunnel syndrome    Diabetes mellitus without complication (HCC)    Gout    Hypercholesteremia    Hypothyroidism    Neuropathy     Obesity    Thyroid disease     Social History   Tobacco Use   Smoking status: Never   Smokeless tobacco: Never  Vaping Use   Vaping Use: Never used  Substance Use Topics   Alcohol use: Yes   Drug use: No    FMH: + diabetes  No Known Allergies  Physical Exam: Constitutional: in no apparent distress  Vitals:   06/25/21 0623 06/25/21 1232  BP: (!) 142/82 (!) 143/80  Pulse: 89 86  Resp: 14   Temp: 97.7 F (36.5 C) 97.6 F (36.4 C)  SpO2: 97% 97%   EYES: anicteric Respiratory: normal respiratory effort Musculoskeletal: left foot with incision, a staple in place, some suturing material.  No surrounding erythema  Skin: no rash  Lab Results  Component Value Date   WBC 17.7 (H) 06/25/2021   HGB 11.4 (L) 06/25/2021   HCT 34.2 (L) 06/25/2021   MCV 92.2 06/25/2021   PLT 374 06/25/2021    Lab Results  Component Value Date   CREATININE 2.10 (H) 06/25/2021   BUN 56 (H) 06/25/2021   NA 131 (L) 06/25/2021   K 4.8 06/25/2021   CL 102 06/25/2021   CO2 19 (L) 06/25/2021    Lab Results  Component Value Date   ALT 17 06/25/2021   AST 16 06/25/2021   ALKPHOS 59 06/25/2021     Microbiology: Recent Results (from the past 240  hour(s))  Blood Culture (routine x 2)     Status: None (Preliminary result)   Collection Time: 06/21/21 11:25 PM   Specimen: BLOOD  Result Value Ref Range Status   Specimen Description   Final    BLOOD RIGHT ANTECUBITAL Performed at [facility] [address] [place], [place] [address]    Special Requests   Final    BOTTLES DRAWN AEROBIC AND ANAEROBIC Blood Culture results may not be optimal due to an excessive volume of blood received in culture bottles Performed at [facility] [address] [place], [place] [address]    Culture   Final    NO GROWTH 3 DAYS Performed at [facility], [place] [address] [place], [place] [address]    Report Status PENDING  Incomplete  MRSA Next Gen by PCR,  Nasal     Status: None   Collection Time: 06/22/21  1:38 PM   Specimen: Nasal Mucosa; Nasal Swab  Result Value Ref Range Status   MRSA by PCR Next Gen NOT DETECTED NOT DETECTED Final    Comment: (NOTE) The GeneXpert MRSA Assay (FDA approved for NASAL specimens only), is one component of a comprehensive MRSA colonization surveillance program. It is not intended to diagnose MRSA infection nor to guide or monitor treatment for MRSA infections. Test performance is not FDA approved in patients less than 78 years old. Performed at [facility], [place] [address], [place], [place] [address]   Blood Culture (routine x 2)     Status: None (Preliminary result)   Collection Time: 06/23/21  4:59 AM   Specimen: BLOOD  Result Value Ref Range Status   Specimen Description   Final    BLOOD LEFT ANTECUBITAL Performed at [facility] [address] [place], [place] [address]    Special Requests   Final    BOTTLES DRAWN AEROBIC ONLY Blood Culture adequate volume Performed at [facility] [address], [place], [place] [address]    Culture   Final    NO GROWTH 2 DAYS Performed at [facility] [address], [place], [place] [address]    Report Status PENDING  Incomplete    Thayer Headings, [facility] [place] [email or web address] 06/25/2021, 1:00 PM

## 2021-06-25 NOTE — Progress Notes (Signed)
PROGRESS NOTE    Barry [name]  UXL:[id number] DOB: 27-Aug-1975 DOA: 06/21/2021 PCP: Maryella Shivers, MD   Brief Narrative:  The Patient is a 46 year old morbidly obese African-American male with past medical history significant for but limited to diabetes mellitus type 2, gout, hyperlipidemia, stage IIIb chronic kidney disease with a creatinine ranging from 1.6-2.1 at baseline as well as other comorbidities who presented to the hospital with foot pain and hand pain as well as a subjective fever.  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  He was initially discharged on doxycycline that he developed worsening left foot swelling, erythema, discomfort and increased warmth as well as development of purulent drainage for the last 2 to 3 days associated with subjective fever as well as chills, full body rigors and myalgias.   Patient states that anytime he is surgery he has gout flares and he feels like he has a flare of gout in his left hand.  He presented to the ED and he was admitted for sepsis secondary to cellulitis with concern for osteomyelitis and I ordered an MRI of his foot given the warmth and erythema.  Podiatry has been consulted for further evaluation and currently his MRI is still pending.  He is started on broad-spectrum antibiotics which we will continue for now and de-escalate as accordingly but we will also have ID evaluate.  ID evaluated and recommending stopping metronidazole and ceftriaxone and switching vancomycin to linezolid given his elevation in creatinine and recommending discharging on linezolid for 7 days afterwards despite osteomyelitis not being evident on MRI.  Patient had been on doxycycline and Augmentin and his MRSA was resistant to tetracycline outpatient setting and currently  continues to be treated for gout and is improving.   Assessment and Plan: Sepsis secondary to left foot cellulitis/surgical site infection with concern for possible underlying osteomyelitis at the surgical site  -Obtaining MRI of the foot and continuing LR at 100 mils per hour and reduce the rate to 75 MLS per hour for 12 more hours -Initiated on antibiotics with vancomycin, and ceftriaxone and will continue along with IV Flagyl but now have stopped given that ID is recommending linezolid -WBC went from 12.1 -> 12.6 and is now 10.2 but is now trended back up to 14.2 in the setting of steroid demargination and today is now 17.7 -LA went from 1.1 -> 0.8 -Inflammatory markers are elevated and ESR was 149 CRP was 19.2 -Podiatry evaluated and MRI of the foot was done and cannot entirely rule out osteomyelitis and findings were reviewed but podiatry feels that clinically less likely osteomyelitis but recommends continue to monitor -We will consult ID for further evaluation recommendations and recommending changing to linezolid as above -Sepsis physiology is improving   Acute gout flare -Continue with colchicine and hold his allopurinol -Given his significant amount of pain that he is still having we will start him on 60 mg of p.o. prednisone with close monitoring of his blood glucose -Uric acid level today was 6.0 but he continues to have some left hand pain and continues to persist may need further hand imaging -Continue under pain control with acetaminophen and hydromorphone 0.5 mg every 2 as needed severe pain -May need to consult Ortho if still persisting; hand is improving and he is able to make a fist   Diabetes  Mellitus Type 2 -Patient takes a Humalog mix 7030 at home but will substitute NovoLog 70/30 here and start him off on 55 units twice daily  but will go back up to 100 units BID and change sensitive NovoLog/scale insulin before meals and at bedtime to resistant scale -CBGs ranging from  302-347 -Continue to monitor blood sugars carefully and adjust insulin regimen as necessary -Likely will consult diabetes education coordinator for further evaluation   Hypertension -Holding his ARB for now given his renal dysfunction -Resume home Lasix 20 mg p.o. daily given his slight volume overload -We will add IV hydralazine as needed -Continue monitor blood pressures per protocol -Last blood pressure reading was 143/80   Hyperlipidemia -Continue with atorvastatin 80 mg p.o. daily   CKD stage IIIb Metabolic acidosis -Patient's BUNs/creatinine has now gone from 26/1.6 -> 31/1.81 -> 40/2.05 and is slightly trended up to 56/2.10 but given his leg swelling we will give a dose of IV Lasix 40 mg x 1 -Patient has a mild metabolic acidosis with a CO2 of 19, anion gap of 10, chloride level 102 -Avoid further nephrotoxic medications, contrast dyes, hypotension and dehydration to ensure adequate renal perfusion and renally adjust medications; vancomycin is being changed to linezolid -Repeat CMP in a.m.   Normocytic Anemia -Patient is hemoglobin/hematocrit went from 11.3/34.2 -> 11.3/34.3 -> 11.6/34.5 and is now 11.4/34.2 -Check Anemia panel in a.m. -Continue to monitor for signs and symptoms of bleeding; no overt bleeding noted -Repeat CBC in the a.m.   Hyponatremia -Sodium was mildly low with a sodium of 134 and is now improved to 136 again -IVF now stopped and given slight volume overload and started Diuresis    Hypoalbuminemia -Patient's albumin level went from 2.7 is now 2.6 is now 2.7 -Continue monitor and trend and repeat CMP in the a.m.   Morbid Obesity -Complicates overall prognosis and care -Estimated body mass index is 41.87 kg/m as calculated from the following:   Height as of this encounter: 6' 3"  (1.905 m).   Weight as of this encounter: 152 kg.  -Weight Loss and Dietary Counseling given  DVT prophylaxis: SCDs Start: 06/22/21 0247    Code Status: Full Code Family  Communication: No family currently at bedside  Disposition Plan:  Level of care: Progressive Status is: Inpatient Remains inpatient appropriate because: Needs further clinical improvement and ID is changing the patient to linezolid.  Gout is improving and his left wrist is improving slowly and he is able to make a fist more   Consultants:  Podiatry Infectious diseases  Procedures:  MRI of the foot  Antimicrobials:  Anti-infectives (From admission, onward)    Start     Dose/Rate Route Frequency Ordered Stop   06/25/21 2200  linezolid (ZYVOX) tablet 600 mg        600 mg Oral Every 12 hours 06/25/21 1346     06/25/21 0933  vancomycin variable dose per unstable renal function (pharmacist dosing)  Status:  Discontinued         Does not apply See admin instructions 06/25/21 0933 06/25/21 1346   06/22/21 2200  vancomycin (VANCOREADY) IVPB 2000 mg/400 mL  Status:  Discontinued        2,000 mg 200 mL/hr over 120 Minutes Intravenous Every 24 hours 06/22/21 0307 06/25/21 0932   06/22/21 2200  cefTRIAXone (ROCEPHIN) 2 g in sodium chloride 0.9 % 100 mL IVPB  Status:  Discontinued        2 g 200 mL/hr over 30 Minutes Intravenous  Every 24 hours 06/22/21 1255 06/25/21 1346   06/22/21 0045  cefTRIAXone (ROCEPHIN) 2 g in sodium chloride 0.9 % 100 mL IVPB  Status:  Discontinued        2 g 200 mL/hr over 30 Minutes Intravenous Every 24 hours 06/22/21 0039 06/22/21 1255   06/22/21 0045  metroNIDAZOLE (FLAGYL) tablet 500 mg  Status:  Discontinued        500 mg Oral Every 12 hours 06/22/21 0039 06/25/21 1304   06/22/21 0045  vancomycin (VANCOREADY) IVPB 2000 mg/400 mL        2,000 mg 200 mL/hr over 120 Minutes Intravenous  Once 06/22/21 0041 06/22/21 0508       Subjective: Seen and examined at bedside thinks his legs are little bit more swollen hand remains swollen.  Thinks he is got little bit more motion in his wrist though and is able to make a fist 5.  Denies any chest pain or shortness of  breath.  Just concerned about the swelling of legs his blood sugars have been ranging elevated so we have adjusted his insulin I spoke to him about this.  No other concerns or complaints at this time.  Objective: Vitals:   06/24/21 2034 06/25/21 0500 06/25/21 0623 06/25/21 1232  BP: (!) 152/95  (!) 142/82 (!) 143/80  Pulse: 99  89 86  Resp: 18  14   Temp: 97.9 F (36.6 C)  97.7 F (36.5 C) 97.6 F (36.4 C)  TempSrc: Oral  Oral Oral  SpO2: 100%  97% 97%  Weight:  (!) 152 kg    Height:        Intake/Output Summary (Last 24 hours) at 06/25/2021 1747 Last data filed at 06/25/2021 1407 Gross per 24 hour  Intake 1420 ml  Output 1200 ml  Net 220 ml   Filed Weights   06/23/21 0500 06/24/21 0500 06/25/21 0500  Weight: (!) 152.4 kg (!) 152 kg (!) 152 kg   Examination: Physical Exam:  Constitutional: WN/WD morbidly obese African-American male currently no acute distress Respiratory: Diminished to auscultation bilaterally, no wheezing, rales, rhonchi or crackles. Normal respiratory effort and patient is not tachypenic. No accessory muscle use.  Unlabored breathing Cardiovascular: RRR, no murmurs / rubs / gallops. S1 and S2 auscultated. N has 1+ lower extremity edema but now also has edema on the right leg which is slightly pitting Abdomen: Soft, non-tender, distended secondary to body habitus. Bowel sounds positive.  GU: Deferred. Musculoskeletal: No clubbing / cyanosis of digits/nails. No joint deformity upper and lower extremities.  Right leg is swollen now and left wrist is also swollen.  Left foot is wrapped.  Patient has little bit more motion on his left hand now and is able to make a fist Neurologic: CN 2-12 grossly intact with no focal deficits. Romberg sign and cerebellar reflexes not assessed.  Psychiatric: Normal judgment and insight. Alert and oriented x 3. Normal mood and appropriate affect.   Data Reviewed: I have personally reviewed following labs and imaging  studies  CBC: Recent Labs  Lab 06/21/21 2325 06/22/21 0615 06/23/21 0459 06/24/21 0444 06/25/21 0532  WBC 12.1* 12.6* 10.2 14.2* 17.7*  NEUTROABS 8.2* 8.5* 6.3 11.9* 14.9*  HGB 12.1* 11.3* 11.3* 11.6* 11.4*  HCT 35.9* 34.2* 34.3* 34.5* 34.2*  MCV 91.1 91.4 92.0 90.3 92.2  PLT 344 324 313 359 219   Basic Metabolic Panel: Recent Labs  Lab 06/21/21 2325 06/22/21 0615 06/23/21 0459 06/24/21 0444 06/25/21 0532  NA 136 134* 136 136  131*  K 4.2 4.4 4.3 4.9 4.8  CL 105 103 105 104 102  CO2 24 22 22 22  19*  GLUCOSE 134* 147* 171* 320* 329*  BUN 26* 26* 31* 40* 56*  CREATININE 1.83* 1.67* 1.81* 2.05* 2.10*  CALCIUM 8.6* 8.4* 8.8* 8.9 8.4*  MG  --  1.8 1.9 2.1 2.1  PHOS  --   --  3.4 3.2 3.3   GFR: Estimated Creatinine Clearance: 71.5 mL/min (A) (by C-G formula based on SCr of 2.1 mg/dL (H)). Liver Function Tests: Recent Labs  Lab 06/21/21 2325 06/22/21 0615 06/23/21 0459 06/24/21 0444 06/25/21 0532  AST 13* 12* 14* 19 16  ALT 15 14 13 16 17   ALKPHOS 53 50 55 57 59  BILITOT 0.8 0.8 0.7 0.4 0.4  PROT 7.9 7.2 7.2 7.5 7.3  ALBUMIN 3.0* 2.8* 2.7* 2.6* 2.7*   No results for input(s): LIPASE, AMYLASE in the last 168 hours. No results for input(s): AMMONIA in the last 168 hours. Coagulation Profile: Recent Labs  Lab 06/21/21 2325  INR 1.0   Cardiac Enzymes: No results for input(s): CKTOTAL, CKMB, CKMBINDEX, TROPONINI in the last 168 hours. BNP (last 3 results) No results for input(s): PROBNP in the last 8760 hours. HbA1C: No results for input(s): HGBA1C in the last 72 hours. CBG: Recent Labs  Lab 06/24/21 1127 06/24/21 1705 06/25/21 0003 06/25/21 0620 06/25/21 1230  GLUCAP 308* 315* 347* 331* 302*   Lipid Profile: No results for input(s): CHOL, HDL, LDLCALC, TRIG, CHOLHDL, LDLDIRECT in the last 72 hours. Thyroid Function Tests: No results for input(s): TSH, T4TOTAL, FREET4, T3FREE, THYROIDAB in the last 72 hours. Anemia Panel: No results for input(s):  VITAMINB12, FOLATE, FERRITIN, TIBC, IRON, RETICCTPCT in the last 72 hours. Sepsis Labs: Recent Labs  Lab 06/21/21 2325 06/22/21 0615  LATICACIDVEN 1.1 0.8    Recent Results (from the past 240 hour(s))  Blood Culture (routine x 2)     Status: None (Preliminary result)   Collection Time: 06/21/21 11:25 PM   Specimen: BLOOD  Result Value Ref Range Status   Specimen Description   Final    BLOOD RIGHT ANTECUBITAL Performed at [facility] [address], [place], [place] [address]    Special Requests   Final    BOTTLES DRAWN AEROBIC AND ANAEROBIC Blood Culture results may not be optimal due to an excessive volume of blood received in culture bottles Performed at [facility] [address], [place], [place] [address]    Culture   Final    NO GROWTH 3 DAYS Performed at [facility], [place] [address] [place], [place] [address]    Report Status PENDING  Incomplete  MRSA Next Gen by PCR, Nasal     Status: None   Collection Time: 06/22/21  1:38 PM   Specimen: Nasal Mucosa; Nasal Swab  Result Value Ref Range Status   MRSA by PCR Next Gen NOT DETECTED NOT DETECTED Final    Comment: (NOTE) The GeneXpert MRSA Assay (FDA approved for NASAL specimens only), is one component of a comprehensive MRSA colonization surveillance program. It is not intended to diagnose MRSA infection nor to guide or monitor treatment for MRSA infections. Test performance is not FDA approved in patients less than 47 years old. Performed at [facility], [place] [address], [place], [place] [address]   Blood Culture (routine x 2)     Status: None (Preliminary result)   Collection Time: 06/23/21  4:59 AM  Specimen: BLOOD  Result Value Ref Range Status   Specimen Description   Final    BLOOD LEFT ANTECUBITAL Performed at [facility] [address], [place], [place] [address]    Special Requests   Final    BOTTLES DRAWN AEROBIC  ONLY Blood Culture adequate volume Performed at [facility] [address], [place], [place] [address]    Culture   Final    NO GROWTH 2 DAYS Performed at [facility] [address] [place], [place] [address]    Report Status PENDING  Incomplete     Radiology Studies: No results found.   Scheduled Meds:  atorvastatin  80 mg Oral Daily   colchicine  1.2 mg Oral Daily   insulin aspart  0-20 Units Subcutaneous TID WC   insulin aspart  0-5 Units Subcutaneous QHS   insulin aspart protamine- aspart  100 Units Subcutaneous BID WC   linezolid  600 mg Oral Q12H   predniSONE  60 mg Oral Q breakfast   Continuous Infusions:   LOS: 3 days   Raiford Noble, DO [facility] Available via Epic secure chat 7am-7pm After these hours, please refer to coverage provider listed on [email or web address] 06/25/2021, 5:47 PM

## 2021-06-25 NOTE — Progress Notes (Signed)
Pharmacy Antibiotic Note  Barry [name] is a 46 y.o. male admitted on 06/21/2021 with cellulitis.  Pharmacy has been consulted for Vancomycin dosing.  Day 4 vanc/rocephin/flagyl Afebrile WBC 17.7 up (note on prednisone for gout) SCr 2.1, continues to rise 5/22 wound Cx: MRSA, only +Culture  Plan: Due to rise in SCr, will hold vanc dose today and check random vanc level in AM Rocephin/flagyl per Md Monitor renal function and cx data   Height: 6\' 3"  (190.5 cm) Weight: (!) 152 kg (335 lb) IBW/kg (Calculated) : 84.5  Temp (24hrs), Avg:97.9 F (36.6 C), Min:97.7 F (36.5 C), Max:98.2 F (36.8 C)  Recent Labs  Lab 06/21/21 2325 06/22/21 0615 06/23/21 0459 06/24/21 0444 06/25/21 0532  WBC 12.1* 12.6* 10.2 14.2* 17.7*  CREATININE 1.83* 1.67* 1.81* 2.05* 2.10*  LATICACIDVEN 1.1 0.8  --   --   --      Estimated Creatinine Clearance: 71.5 mL/min (A) (by C-G formula based on SCr of 2.1 mg/dL (H)).    No Known Allergies  Antimicrobials this admission: 6/3 Ceftriaxone >>  6/3 Vancomycin >>  6/3 Metronidazole >>  Dose adjustments this admission:  Microbiology results: 6/3 BCx: ngtd  Thank you for allowing pharmacy to be a part of this patient's care.  8/3, PharmD, BCPS Secure Chat if ?s or see patient assignments for phone # 06/25/2021 9:31 AM

## 2021-06-25 NOTE — TOC Benefit Eligibility Note (Signed)
Patient Teacher, English as a foreign language completed.    The patient is currently admitted and upon discharge could be taking linezolid (Zyvox) 600 mg tablets.  The current 7 day co-pay is, $5.99.   The patient is insured through [employer]    Liley Rake, [place] Patient Advocate Specialist [facility] Patient Advocate Team Direct Number: [phone]  Fax: [phone]

## 2021-06-26 ENCOUNTER — Encounter: Payer: BC Managed Care – PPO | Admitting: Podiatry

## 2021-06-26 DIAGNOSIS — L03116 Cellulitis of left lower limb: Secondary | ICD-10-CM | POA: Diagnosis not present

## 2021-06-26 LAB — PHOSPHORUS: Phosphorus: 3.4 mg/dL (ref 2.5–4.6)

## 2021-06-26 LAB — CBC WITH DIFFERENTIAL/PLATELET
Abs Immature Granulocytes: 0.11 10*3/uL — ABNORMAL HIGH (ref 0.00–0.07)
Basophils Absolute: 0 10*3/uL (ref 0.0–0.1)
Basophils Relative: 0 %
Eosinophils Absolute: 0 10*3/uL (ref 0.0–0.5)
Eosinophils Relative: 0 %
HCT: 35.5 % — ABNORMAL LOW (ref 39.0–52.0)
Hemoglobin: 11.6 g/dL — ABNORMAL LOW (ref 13.0–17.0)
Immature Granulocytes: 1 %
Lymphocytes Relative: 10 %
Lymphs Abs: 1.5 10*3/uL (ref 0.7–4.0)
MCH: 30 pg (ref 26.0–34.0)
MCHC: 32.7 g/dL (ref 30.0–36.0)
MCV: 91.7 fL (ref 80.0–100.0)
Monocytes Absolute: 1.1 10*3/uL — ABNORMAL HIGH (ref 0.1–1.0)
Monocytes Relative: 7 %
Neutro Abs: 12.3 10*3/uL — ABNORMAL HIGH (ref 1.7–7.7)
Neutrophils Relative %: 82 %
Platelets: 423 10*3/uL — ABNORMAL HIGH (ref 150–400)
RBC: 3.87 MIL/uL — ABNORMAL LOW (ref 4.22–5.81)
RDW: 13.2 % (ref 11.5–15.5)
WBC: 15 10*3/uL — ABNORMAL HIGH (ref 4.0–10.5)
nRBC: 0 % (ref 0.0–0.2)

## 2021-06-26 LAB — COMPREHENSIVE METABOLIC PANEL
ALT: 16 U/L (ref 0–44)
AST: 15 U/L (ref 15–41)
Albumin: 2.8 g/dL — ABNORMAL LOW (ref 3.5–5.0)
Alkaline Phosphatase: 54 U/L (ref 38–126)
Anion gap: 10 (ref 5–15)
BUN: 64 mg/dL — ABNORMAL HIGH (ref 6–20)
CO2: 21 mmol/L — ABNORMAL LOW (ref 22–32)
Calcium: 8.7 mg/dL — ABNORMAL LOW (ref 8.9–10.3)
Chloride: 104 mmol/L (ref 98–111)
Creatinine, Ser: 2.06 mg/dL — ABNORMAL HIGH (ref 0.61–1.24)
GFR, Estimated: 40 mL/min — ABNORMAL LOW (ref >60)
Glucose, Bld: 298 mg/dL — ABNORMAL HIGH (ref 70–99)
Potassium: 4.8 mmol/L (ref 3.5–5.1)
Sodium: 135 mmol/L (ref 135–145)
Total Bilirubin: 0.4 mg/dL (ref 0.3–1.2)
Total Protein: 7.5 g/dL (ref 6.5–8.1)

## 2021-06-26 LAB — GLUCOSE, CAPILLARY
Glucose-Capillary: 300 mg/dL — ABNORMAL HIGH (ref 70–99)
Glucose-Capillary: 258 mg/dL — ABNORMAL HIGH (ref 70–99)
Glucose-Capillary: 221 mg/dL — ABNORMAL HIGH (ref 70–99)
Glucose-Capillary: 223 mg/dL — ABNORMAL HIGH (ref 70–99)
Glucose-Capillary: 284 mg/dL — ABNORMAL HIGH (ref 70–99)

## 2021-06-26 LAB — MAGNESIUM: Magnesium: 2 mg/dL (ref 1.7–2.4)

## 2021-06-26 MED ORDER — INSULIN ASPART PROT & ASPART (70-30 MIX) 100 UNIT/ML ~~LOC~~ SUSP
110.0000 [IU] | Freq: Two times a day (BID) | SUBCUTANEOUS | Status: DC
  Administered 2021-06-26 – 2021-06-27 (×2): 110 [IU] via SUBCUTANEOUS

## 2021-06-26 MED ORDER — METOPROLOL TARTRATE 5 MG/5ML IV SOLN: 5.0000 mg | INTRAVENOUS | Status: DC | PRN

## 2021-06-26 MED ORDER — TRAZODONE HCL 50 MG PO TABS: 50.0000 mg | ORAL_TABLET | Freq: Every evening | ORAL | Status: DC | PRN

## 2021-06-26 MED ORDER — IPRATROPIUM-ALBUTEROL 0.5-2.5 (3) MG/3ML IN SOLN: 3.0000 mL | RESPIRATORY_TRACT | Status: DC | PRN

## 2021-06-26 MED ORDER — LOSARTAN POTASSIUM 25 MG PO TABS
25.0000 mg | ORAL_TABLET | Freq: Two times a day (BID) | ORAL | Status: DC
  Administered 2021-06-26 – 2021-06-27 (×3): 25 mg via ORAL
  Filled 2021-06-26 (×3): qty 1

## 2021-06-26 MED ORDER — SENNOSIDES-DOCUSATE SODIUM 8.6-50 MG PO TABS: 1.0000 | ORAL_TABLET | Freq: Every evening | ORAL | Status: DC | PRN

## 2021-06-26 MED ORDER — OXYCODONE HCL 5 MG PO TABS
5.0000 mg | ORAL_TABLET | ORAL | Status: DC | PRN
  Administered 2021-06-26 – 2021-06-27 (×2): 5 mg via ORAL
  Filled 2021-06-26 (×2): qty 1

## 2021-06-26 NOTE — Progress Notes (Signed)
PROGRESS NOTE    Barry [name]  [id number] DOB: 16-Aug-1975 DOA: 06/21/2021 PCP: Maryella Shivers, MD   Brief Narrative:  46 year old morbidly obese African-American male with past medical history significant for but limited to diabetes mellitus type 2, gout, hyperlipidemia, stage IIIb chronic kidney disease with a creatinine ranging from 1.6-2.1 at baseline as well as other comorbidities who presented to the hospital with foot pain and hand pain as well as a subjective fever.  Patient was diagnosed with left foot cellulitis and concerns of acute gout flare.  Infectious disease consulted.,  Started on steroids as well.   Assessment & Plan:  Principal Problem:   Cellulitis of left lower extremity Active Problems:   HLD (hyperlipidemia)   Stage 3b chronic kidney disease (CKD) (HCC)   DM2 (diabetes mellitus, type 2) (HCC)   Sepsis ([place])   Gout flare     Assessment and Plan: Sepsis secondary to left foot cellulitis/surgical site infection with concern for possible underlying osteomyelitis at the surgical site  -MRI appears to be negative for osteomyelitis - Infectious disease following.  Antibiotics per their guidance - Current antibiotics-linezolid for total of 7 days? 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  They do not seem to think that there is active infection, recommending outpatient follow-up.  Acute gout flare -Currently on colchicine.  Allopurinol on hold due to mildly elevated renal function - Prednisone 60 mg daily. -Pain control   Diabetes Mellitus Type 2, insulin-dependent. A1c 10.7 -Currently on 70/30 100 units twice daily.  Sliding scale and Accu-Cheks.   Hypertension -Home ARB and Lasix on hold for now.  IV as needed ordered.   Hyperlipidemia -Lipitor 80 mg daily   CKD stage IIIb Metabolic acidosis -Continue to monitor.  Creatinine baseline around 1.8.  Today it is 2.0.  Closely monitor.   Normocytic Anemia -Hemoglobin  around baseline.   Hyponatremia -Resolved   Morbid Obesity -Complicates overall prognosis and care -Estimated body mass index is 41.87 kg/m as calculated from the following:   Height as of this encounter: 6\' 3"  (1.905 m).   Weight as of this encounter: 152 kg.  -Weight Loss and Dietary Counseling given    DVT prophylaxis: SCDs Start: 06/22/21 0247 Code Status: Full code Family Communication:    Status is: Inpatient Remains inpatient appropriate because: Still has some swelling in bilateral upper and lower extremity.  Mobility is slowly improving with steroids.  Blood glucose still remains high.     Subjective: Patient states the swelling in his arms and legs has improved a little bit.  He has a little more mobility in terms of flexion.  Examination:  General exam: Appears calm and comfortable  Respiratory system: Clear to auscultation. Respiratory effort normal. Cardiovascular system: S1 & S2 heard, RRR. No JVD, murmurs, rubs, gallops or clicks. Non pitting edema b/l LE UE edema Gastrointestinal system: Abdomen is nondistended, soft and nontender. No organomegaly or masses felt. Normal bowel sounds heard. Central nervous system: Alert and oriented. No focal neurological deficits. Extremities: Symmetric 5 x 5 power. Skin: No rashes, lesions or ulcers Psychiatry: Judgement and insight appear normal. Mood & affect appropriate.   Objective: Vitals:   06/25/21 1232 06/25/21 2220 06/26/21 0500 06/26/21 0559  BP: (!) 143/80 (!) 156/100  (!) 154/97  Pulse: 86 94  85  Resp:  17  15  Temp: 97.6 F (36.4 C) 98 F (36.7 C)  97.6 F (36.4 C)  TempSrc: Oral Oral  Oral  SpO2:  97% 98%  99%  Weight:   (!) 149 kg   Height:        Intake/Output Summary (Last 24 hours) at 06/26/2021 0850 Last data filed at 06/26/2021 0330 Gross per 24 hour  Intake 1540 ml  Output 3400 ml  Net -1860 ml   Filed Weights   06/24/21 0500 06/25/21 0500 06/26/21 0500  Weight: (!) 152 kg (!) 152 kg  (!) 149 kg     Data Reviewed:   CBC: Recent Labs  Lab 06/22/21 0615 06/23/21 0459 06/24/21 0444 06/25/21 0532 06/26/21 0441  WBC 12.6* 10.2 14.2* 17.7* 15.0*  NEUTROABS 8.5* 6.3 11.9* 14.9* 12.3*  HGB 11.3* 11.3* 11.6* 11.4* 11.6*  HCT 34.2* 34.3* 34.5* 34.2* 35.5*  MCV 91.4 92.0 90.3 92.2 91.7  PLT 324 313 359 374 [id number]*   Basic Metabolic Panel: Recent Labs  Lab 06/22/21 0615 06/23/21 0459 06/24/21 0444 06/25/21 0532 06/26/21 0441  NA 134* 136 136 131* 135  K 4.4 4.3 4.9 4.8 4.8  CL 103 105 104 102 104  CO2 22 22 22  19* 21*  GLUCOSE 147* 171* 320* 329* 298*  BUN 26* 31* 40* 56* 64*  CREATININE 1.67* 1.81* 2.05* 2.10* 2.06*  CALCIUM 8.4* 8.8* 8.9 8.4* 8.7*  MG 1.8 1.9 2.1 2.1 2.0  PHOS  --  3.4 3.2 3.3 3.4   GFR: Estimated Creatinine Clearance: 72.1 mL/min (A) (by C-G formula based on SCr of 2.06 mg/dL (H)). Liver Function Tests: Recent Labs  Lab 06/22/21 0615 06/23/21 0459 06/24/21 0444 06/25/21 0532 06/26/21 0441  AST 12* 14* 19 16 15   ALT 14 13 16 17 16   ALKPHOS 50 55 57 59 54  BILITOT 0.8 0.7 0.4 0.4 0.4  PROT 7.2 7.2 7.5 7.3 7.5  ALBUMIN 2.8* 2.7* 2.6* 2.7* 2.8*   No results for input(s): LIPASE, AMYLASE in the last 168 hours. No results for input(s): AMMONIA in the last 168 hours. Coagulation Profile: Recent Labs  Lab 06/21/21 2325  INR 1.0   Cardiac Enzymes: No results for input(s): CKTOTAL, CKMB, CKMBINDEX, TROPONINI in the last 168 hours. BNP (last 3 results) No results for input(s): PROBNP in the last 8760 hours. HbA1C: No results for input(s): HGBA1C in the last 72 hours. CBG: Recent Labs  Lab 06/25/21 1230 06/25/21 1821 06/25/21 2209 06/26/21 0554 06/26/21 0735  GLUCAP 302* 245* 339* 300* 258*   Lipid Profile: No results for input(s): CHOL, HDL, LDLCALC, TRIG, CHOLHDL, LDLDIRECT in the last 72 hours. Thyroid Function Tests: No results for input(s): TSH, T4TOTAL, FREET4, T3FREE, THYROIDAB in the last 72 hours. Anemia  Panel: No results for input(s): VITAMINB12, FOLATE, FERRITIN, TIBC, IRON, RETICCTPCT in the last 72 hours. Sepsis Labs: Recent Labs  Lab 06/21/21 2325 06/22/21 0615  LATICACIDVEN 1.1 0.8    Recent Results (from the past 240 hour(s))  Blood Culture (routine x 2)     Status: None (Preliminary result)   Collection Time: 06/21/21 11:25 PM   Specimen: BLOOD  Result Value Ref Range Status   Specimen Description   Final    BLOOD RIGHT ANTECUBITAL Performed at [facility] [address], [place], [place] [address]    Special Requests   Final    BOTTLES DRAWN AEROBIC AND ANAEROBIC Blood Culture results may not be optimal due to an excessive volume of blood received in culture bottles Performed at [facility] [address] [place], [place] [address]    Culture   Final    NO  GROWTH 3 DAYS Performed at [facility], [place] [address] [place], [place] [address]    Report Status PENDING  Incomplete  MRSA Next Gen by PCR, Nasal     Status: None   Collection Time: 06/22/21  1:38 PM   Specimen: Nasal Mucosa; Nasal Swab  Result Value Ref Range Status   MRSA by PCR Next Gen NOT DETECTED NOT DETECTED Final    Comment: (NOTE) The GeneXpert MRSA Assay (FDA approved for NASAL specimens only), is one component of a comprehensive MRSA colonization surveillance program. It is not intended to diagnose MRSA infection nor to guide or monitor treatment for MRSA infections. Test performance is not FDA approved in patients less than 48 years old. Performed at [facility], [place] [address], [place], [place] [address]   Blood Culture (routine x 2)     Status: None (Preliminary result)   Collection Time: 06/23/21  4:59 AM   Specimen: BLOOD  Result Value Ref Range Status   Specimen Description   Final    BLOOD LEFT ANTECUBITAL Performed at [facility] [address] [place], [place] [address]    Special Requests    Final    BOTTLES DRAWN AEROBIC ONLY Blood Culture adequate volume Performed at [facility] [address] [place], [place] [address]    Culture   Final    NO GROWTH 2 DAYS Performed at [facility] [address], [place], [place] [address]    Report Status PENDING  Incomplete         Radiology Studies: No results found.      Scheduled Meds:  atorvastatin  80 mg Oral Daily   colchicine  1.2 mg Oral Daily   insulin aspart  0-20 Units Subcutaneous TID WC   insulin aspart  0-5 Units Subcutaneous QHS   insulin aspart protamine- aspart  100 Units Subcutaneous BID WC   linezolid  600 mg Oral Q12H   predniSONE  60 mg Oral Q breakfast   Continuous Infusions:   LOS: 4 days   Time spent= 35 mins    Sanoe Hazan Arsenio Loader, MD [facility]  If 7PM-7AM, please contact night-coverage  06/26/2021, 8:50 AM

## 2021-06-26 NOTE — [place] Notes (Signed)
[facility]   Reason for visit: Follow up on foot infection  Interval History: changed to linezolid yesterday, no issues.  WBC 15, remains afebrile.    Physical Exam: Constitutional:  Vitals:   06/25/21 2220 06/26/21 0559  BP: (!) 156/100 (!) 154/97  Pulse: 94 85  Resp: 17 15  Temp: 98 F (36.7 C) 97.6 F (36.4 C)  SpO2: 98% 99%   patient appears in NAD Respiratory: Normal respiratory effort  Review of Systems: Constitutional: negative for fevers and chills  Lab Results  Component Value Date   WBC 15.0 (H) 06/26/2021   HGB 11.6 (L) 06/26/2021   HCT 35.5 (L) 06/26/2021   MCV 91.7 06/26/2021   PLT 423 (H) 06/26/2021    Lab Results  Component Value Date   CREATININE 2.06 (H) 06/26/2021   BUN 64 (H) 06/26/2021   NA 135 06/26/2021   K 4.8 06/26/2021   CL 104 06/26/2021   CO2 21 (L) 06/26/2021    Lab Results  Component Value Date   ALT 16 06/26/2021   AST 15 06/26/2021   ALKPHOS 54 06/26/2021     Microbiology: Recent Results (from the past 240 hour(s))  Blood Culture (routine x 2)     Status: None (Preliminary result)   Collection Time: 06/21/21 11:25 PM   Specimen: BLOOD  Result Value Ref Range Status   Specimen Description   Final    BLOOD RIGHT ANTECUBITAL Performed at [facility], [address] [place], [place] [address]    Special Requests   Final    BOTTLES DRAWN AEROBIC AND ANAEROBIC Blood Culture results may not be optimal due to an excessive volume of blood received in culture bottles Performed at [facility], [address] [place], [place] [address]    Culture   Final    NO GROWTH 4 DAYS Performed at [facility], [address], [place], [place] [address]    Report Status PENDING  Incomplete  MRSA Next Gen by PCR, Nasal     Status: None   Collection Time: 06/22/21  1:38 PM   Specimen: Nasal Mucosa; Nasal Swab  Result Value Ref Range Status   MRSA by PCR Next Gen  NOT DETECTED NOT DETECTED Final    Comment: (NOTE) The GeneXpert MRSA Assay (FDA approved for NASAL specimens only), is one component of a comprehensive MRSA colonization surveillance program. It is not intended to diagnose MRSA infection nor to guide or monitor treatment for MRSA infections. Test performance is not FDA approved in patients less than 40 years old. Performed at [facility], [address] [place], [place] [address]   Blood Culture (routine x 2)     Status: None (Preliminary result)   Collection Time: 06/23/21  4:59 AM   Specimen: BLOOD  Result Value Ref Range Status   Specimen Description   Final    BLOOD LEFT ANTECUBITAL Performed at [facility], [address], [place], [place] [address]    Special Requests   Final    BOTTLES DRAWN AEROBIC ONLY Blood Culture adequate volume Performed at [facility], [address], [place], [place] [address]    Culture   Final    NO GROWTH 3 DAYS Performed at [facility], [address] [place], [place] [address]    Report Status PENDING  Incomplete    Impression/Plan:  1. Superficial infection - growth in culture noted  from the OR.  Now on linezolid and tolerating well.  Continue for 7 days further at discharge.    2.  Renal insufficiency - some increase on vancomycin so have stopped vancomycin.    3.  Diabetes - Hgb A1c 10.7.  need for good sugar control to improve healing.    I will sign off, call with questions.

## 2021-06-27 DIAGNOSIS — L03116 Cellulitis of left lower limb: Secondary | ICD-10-CM | POA: Diagnosis not present

## 2021-06-27 LAB — CULTURE, BLOOD (ROUTINE X 2): Culture: NO GROWTH

## 2021-06-27 LAB — BASIC METABOLIC PANEL
Anion gap: 9 (ref 5–15)
BUN: 61 mg/dL — ABNORMAL HIGH (ref 6–20)
CO2: 20 mmol/L — ABNORMAL LOW (ref 22–32)
Calcium: 8.8 mg/dL — ABNORMAL LOW (ref 8.9–10.3)
Chloride: 104 mmol/L (ref 98–111)
Creatinine, Ser: 1.69 mg/dL — ABNORMAL HIGH (ref 0.61–1.24)
GFR, Estimated: 51 mL/min — ABNORMAL LOW (ref >60)
Glucose, Bld: 347 mg/dL — ABNORMAL HIGH (ref 70–99)
Potassium: 4.8 mmol/L (ref 3.5–5.1)
Sodium: 133 mmol/L — ABNORMAL LOW (ref 135–145)

## 2021-06-27 LAB — CBC
HCT: 36.5 % — ABNORMAL LOW (ref 39.0–52.0)
Hemoglobin: 12.2 g/dL — ABNORMAL LOW (ref 13.0–17.0)
MCH: 30.4 pg (ref 26.0–34.0)
MCHC: 33.4 g/dL (ref 30.0–36.0)
MCV: 91 fL (ref 80.0–100.0)
Platelets: 465 10*3/uL — ABNORMAL HIGH (ref 150–400)
RBC: 4.01 MIL/uL — ABNORMAL LOW (ref 4.22–5.81)
RDW: 13.1 % (ref 11.5–15.5)
WBC: 16.4 10*3/uL — ABNORMAL HIGH (ref 4.0–10.5)
nRBC: 0 % (ref 0.0–0.2)

## 2021-06-27 LAB — GLUCOSE, CAPILLARY: Glucose-Capillary: 282 mg/dL — ABNORMAL HIGH (ref 70–99)

## 2021-06-27 LAB — MAGNESIUM: Magnesium: 2 mg/dL (ref 1.7–2.4)

## 2021-06-27 MED ORDER — AMLODIPINE BESYLATE 10 MG PO TABS
10.0000 mg | ORAL_TABLET | Freq: Every day | ORAL | Status: DC
  Administered 2021-06-27: 10 mg via ORAL
  Filled 2021-06-27: qty 1

## 2021-06-27 MED ORDER — EMPAGLIFLOZIN 25 MG PO TABS
25.0000 mg | ORAL_TABLET | Freq: Every day | ORAL | Status: DC
  Administered 2021-06-27: 25 mg via ORAL
  Filled 2021-06-27: qty 1

## 2021-06-27 MED ORDER — COLCHICINE 0.6 MG PO TABS: 0.6000 mg | ORAL_TABLET | Freq: Every day | ORAL | 0 refills | Status: AC | PRN

## 2021-06-27 MED ORDER — LOSARTAN POTASSIUM 25 MG PO TABS: 25.0000 mg | ORAL_TABLET | Freq: Two times a day (BID) | ORAL | 0 refills | Status: DC

## 2021-06-27 MED ORDER — PREDNISONE 10 MG PO TABS
ORAL_TABLET | ORAL | 0 refills | Status: AC
Start: 2021-06-27 — End: 2021-07-01

## 2021-06-27 MED ORDER — LINEZOLID 600 MG PO TABS: 600.0000 mg | ORAL_TABLET | Freq: Two times a day (BID) | ORAL | 0 refills | Status: AC

## 2021-06-27 MED ORDER — AMLODIPINE BESYLATE 10 MG PO TABS: 10.0000 mg | ORAL_TABLET | Freq: Every day | ORAL | 0 refills | Status: DC

## 2021-06-27 NOTE — Plan of Care (Signed)
  Problem: Education: Goal: Ability to describe self-care measures that may prevent or decrease complications (Diabetes Survival Skills Education) will improve Outcome: Adequate for Discharge Goal: Individualized Educational Video(s) Outcome: Adequate for Discharge   Problem: Coping: Goal: Ability to adjust to condition or change in health will improve Outcome: Adequate for Discharge   Problem: Fluid Volume: Goal: Ability to maintain a balanced intake and output will improve Outcome: Adequate for Discharge   Problem: Health Behavior/Discharge Planning: Goal: Ability to identify and utilize available resources and services will improve Outcome: Adequate for Discharge Goal: Ability to manage health-related needs will improve Outcome: Adequate for Discharge   Problem: Metabolic: Goal: Ability to maintain appropriate glucose levels will improve Outcome: Adequate for Discharge   Problem: Nutritional: Goal: Maintenance of adequate nutrition will improve Outcome: Adequate for Discharge Goal: Progress toward achieving an optimal weight will improve Outcome: Adequate for Discharge   Problem: Skin Integrity: Goal: Risk for impaired skin integrity will decrease Outcome: Adequate for Discharge   Problem: Tissue Perfusion: Goal: Adequacy of tissue perfusion will improve Outcome: Adequate for Discharge   Problem: Education: Goal: Knowledge of General Education information will improve Description: Including pain rating scale, medication(s)/side effects and non-pharmacologic comfort measures Outcome: Adequate for Discharge   Problem: Health Behavior/Discharge Planning: Goal: Ability to manage health-related needs will improve Outcome: Adequate for Discharge   Problem: Clinical Measurements: Goal: Ability to maintain clinical measurements within normal limits will improve Outcome: Adequate for Discharge Goal: Will remain free from infection Outcome: Adequate for Discharge Goal:  Diagnostic test results will improve Outcome: Adequate for Discharge Goal: Respiratory complications will improve Outcome: Adequate for Discharge Goal: Cardiovascular complication will be avoided Outcome: Adequate for Discharge   Problem: Activity: Goal: Risk for activity intolerance will decrease Outcome: Adequate for Discharge   Problem: Nutrition: Goal: Adequate nutrition will be maintained Outcome: Adequate for Discharge   Problem: Coping: Goal: Level of anxiety will decrease Outcome: Adequate for Discharge   Problem: Elimination: Goal: Will not experience complications related to bowel motility Outcome: Adequate for Discharge Goal: Will not experience complications related to urinary retention Outcome: Adequate for Discharge   Problem: Pain Managment: Goal: General experience of comfort will improve Outcome: Adequate for Discharge   Problem: Safety: Goal: Ability to remain free from injury will improve Outcome: Adequate for Discharge   Problem: Skin Integrity: Goal: Risk for impaired skin integrity will decrease Outcome: Adequate for Discharge   Problem: Clinical Measurements: Goal: Ability to avoid or minimize complications of infection will improve Outcome: Adequate for Discharge   Problem: Skin Integrity: Goal: Skin integrity will improve Outcome: Adequate for Discharge   Problem: Fluid Volume: Goal: Hemodynamic stability will improve Outcome: Adequate for Discharge   Problem: Clinical Measurements: Goal: Diagnostic test results will improve Outcome: Adequate for Discharge Goal: Signs and symptoms of infection will decrease Outcome: Adequate for Discharge   Problem: Respiratory: Goal: Ability to maintain adequate ventilation will improve Outcome: Adequate for Discharge

## 2021-06-27 NOTE — Discharge Summary (Signed)
Physician Discharge Summary  Barry [name] FBX:[id number] DOB: 09-Mar-1975 DOA: 06/21/2021  PCP: Maryella Shivers, MD  Admit date: 06/21/2021 Discharge date: 06/27/2021  Admitted From: Home Disposition:  Home  Recommendations for Outpatient Follow-up:  Follow up with PCP in 1-2 weeks Please obtain BMP/CBC in one week your next doctors visit.  Norvasc 10 mg orally daily started Linezolid twice daily for 7 more days 4-day prednisone taper prescribed Colchicine prescribed as needed Follow-up outpatient primary care physician He no longer needs to take Augmentin and doxycycline  Discharge Condition: Stable CODE STATUS: Full code Diet recommendation: Diabetic  Brief/Interim Summary: 46 year old morbidly obese African-American male with past medical history significant for but limited to diabetes mellitus type 2, gout, hyperlipidemia, stage IIIb chronic kidney disease with a creatinine ranging from 1.6-2.1 at baseline as well as other comorbidities who presented to the hospital with foot pain and hand pain as well as a subjective fever.  Patient was diagnosed with left foot cellulitis and concerns of acute gout flare.  Infectious disease consulted.,  Started on steroids as well.  Patient already doing significantly well on steroids and his swelling improved with joint mobility.  Infectious disease recommended 7 days of oral linezolid upon discharge.  Due to uncontrolled blood pressure he was also started on Norvasc 10 mg daily. Today medically stable for discharge.     Assessment & Plan:  Principal Problem:   Cellulitis of left lower extremity Active Problems:   HLD (hyperlipidemia)   Stage 3b chronic kidney disease (CKD) (HCC)   DM2 (diabetes mellitus, type 2) (HCC)   Sepsis ([place])   Gout flare       Assessment and Plan: Sepsis secondary to left foot cellulitis/surgical site infection with concern for possible underlying osteomyelitis at the surgical site  -MRI appears to be negative  for osteomyelitis - Infectious disease following.  Antibiotics per their guidance - We will discharge on 7 more days of oral linezolid.  No need to take Augmentin/doxycycline per infectious disease. 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  They do not seem to think that there is active infection, recommending outpatient follow-up.   Acute gout flare - This has significantly improved.  We will taper off prednisone upon discharge.  Continue home allopurinol as his renal function has significantly improved.  He will be given as needed colchicine as well but asked to be cautious due to interaction with Lipitor.  He can discuss this with his PCP.   Diabetes Mellitus Type 2, insulin-dependent. A1c 10.7 - Resume home regimen   Hypertension - Due to controlled blood pressure Norvasc 10 mg orally daily has been added.  His losartan has been also refilled at his request.   Hyperlipidemia -Lipitor 80 mg daily   CKD stage IIIb Metabolic acidosis -Continue to monitor.  Creatinine baseline around 1.8.  Creatinine improved today to 1.69.  Follow-up outpatient.   Normocytic Anemia -Hemoglobin around baseline.   Hyponatremia -Resolved   Morbid Obesity -Complicates overall prognosis and care -Estimated body mass index is 41.87 kg/m as calculated from the following:   Height as of this encounter: 6' 3"  (1.905 m).   Weight as of this encounter: 152 kg.  -Weight Loss and Dietary Counseling given          Body mass index is 41.33 kg/m.       Discharge Diagnoses:  Principal Problem:   Cellulitis of left lower extremity Active Problems:   HLD (hyperlipidemia)   Stage 3b chronic  kidney disease (CKD) ([place])   DM2 (diabetes mellitus, type 2) ([place])   Sepsis ([place])   Gout flare      Consultations: Infectious disease  Subjective: Feels great, moving all of his joints without any issues.  Swelling is significantly improved.  No complaints.  Discharge  Exam: Vitals:   06/26/21 2118 06/27/21 0349  BP: (!) 165/98 (!) 176/104  Pulse: 83 87  Resp:  18  Temp:  97.9 F (36.6 C)  SpO2: 100% 99%   Vitals:   06/26/21 2117 06/26/21 2118 06/27/21 0349 06/27/21 0500  BP: (!) 171/114 (!) 165/98 (!) 176/104   Pulse: 83 83 87   Resp: 18  18   Temp: 98.1 F (36.7 C)  97.9 F (36.6 C)   TempSrc: Oral  Oral   SpO2: 100% 100% 99%   Weight:    (!) 150 kg  Height:        General: Pt is alert, awake, not in acute distress Cardiovascular: RRR, S1/S2 +, no rubs, no gallops Respiratory: CTA bilaterally, no wheezing, no rhonchi Abdominal: Soft, NT, ND, bowel sounds + Extremities: no edema, no cyanosis  Discharge Instructions   Allergies as of 06/27/2021   No Known Allergies      Medication List     STOP taking these medications    amoxicillin-clavulanate 875-125 MG tablet Commonly known as: AUGMENTIN   doxycycline 100 MG capsule Commonly known as: VIBRAMYCIN       TAKE these medications    Accu-Chek Softclix Lancets lancets 1 each 3 (three) times daily.   allopurinol 300 MG tablet Commonly known as: ZYLOPRIM Take 300 mg by mouth daily.   amLODipine 10 MG tablet Commonly known as: NORVASC Take 1 tablet (10 mg total) by mouth daily. Start taking on: June 28, 2021   aspirin 81 MG chewable tablet Chew 81 mg by mouth daily.   atorvastatin 80 MG tablet Commonly known as: LIPITOR Take 80 mg by mouth daily.   colchicine 0.6 MG tablet Take 1 tablet (0.6 mg total) by mouth daily as needed.   furosemide 20 MG tablet Commonly known as: LASIX Take 20 mg by mouth daily.   gentamicin ointment 0.1 % Commonly known as: GARAMYCIN Apply 1 application. topically daily. For wound care   GlucoCom Blood Glucose Monitor Devi 1 each by Other route 3 (three) times daily.   HumaLOG Mix 75/25 (75-25) 100 UNIT/ML Susp injection Generic drug: insulin lispro protamine-lispro Inject 100 Units into the skin 2 (two) times a day.    Jardiance 25 MG Tabs tablet Generic drug: empagliflozin Take 25 mg by mouth daily.   linezolid 600 MG tablet Commonly known as: ZYVOX Take 1 tablet (600 mg total) by mouth every 12 (twelve) hours for 7 days.   losartan 25 MG tablet Commonly known as: COZAAR Take 1 tablet (25 mg total) by mouth 2 (two) times daily.   naloxone 4 MG/0.1ML Liqd nasal spray kit Commonly known as: NARCAN Place 1 spray into the nose once.   oxyCODONE-acetaminophen 5-325 MG tablet Commonly known as: Percocet Take 1 tablet by mouth every 6 (six) hours as needed for severe pain.   Ozempic (0.25 or 0.5 MG/DOSE) 2 MG/3ML Sopn Generic drug: Semaglutide(0.25 or 0.5MG/DOS) Inject 0.5 mg as directed once a week. fridays   predniSONE 10 MG tablet Commonly known as: DELTASONE Take 4 tablets (40 mg total) by mouth daily for 1 day, THEN 3 tablets (30 mg total) daily for 1 day, THEN 2 tablets (20 mg total)  daily for 1 day, THEN 1 tablet (10 mg total) daily for 1 day. Start taking on: June 27, 2021   pregabalin 75 MG capsule Commonly known as: Lyrica Take 1 capsule (75 mg total) by mouth 2 (two) times daily.   RELION INSULIN SYRINGE 1ML/31G 31G X 5/16" 1 ML Misc Generic drug: Insulin Syringe-Needle U-100 Inject 1 Syringe into the skin 2 (two) times daily.   silver sulfADIAZINE 1 % cream Commonly known as: Silvadene Apply pea-sized amount to wound daily.        Follow-up Information     Maryella Shivers, MD. Schedule an appointment as soon as possible for a visit in 1 week(s).   Specialty: Family Medicine Contact information: [place] [address] [phone]                No Known Allergies  You were cared for by a hospitalist during your hospital stay. If you have any questions about your discharge medications or the care you received while you were in the hospital after you are discharged, you can call the unit and asked to speak with the hospitalist on call  if the hospitalist that took care of you is not available. Once you are discharged, your primary care physician will handle any further medical issues. Please note that no refills for any discharge medications will be authorized once you are discharged, as it is imperative that you return to your primary care physician (or establish a relationship with a primary care physician if you do not have one) for your aftercare needs so that they can reassess your need for medications and monitor your lab values.   Procedures/Studies: DG Foot Complete Right  Result Date: 06/22/2021 CLINICAL DATA:  History of diabetes.  Swelling of the foot. EXAM: RIGHT FOOT COMPLETE - 3+ VIEW COMPARISON:  11/02/2020 FINDINGS: There is been segmental amputation of the great toe. Distal phalanx and proximal aspect of the proximal phalanx remain. There are no destructive changes to suggest interval osteomyelitis. Regional soft tissue swelling is present however. No other finding. IMPRESSION: Partial amputation of the great toe as above. Regional soft tissue swelling. No additional bone destruction or irregularity to suggest ongoing osteomyelitis by radiography. Electronically Signed   By: Nelson Chimes M.D.   On: 06/22/2021 19:20   MR FOOT LEFT WO CONTRAST  Result Date: 06/22/2021 CLINICAL DATA:  Foot pain.  Fifth ray resection on 06/10/2021 EXAM: MRI OF THE LEFT FOOT WITHOUT CONTRAST TECHNIQUE: Multiplanar, multisequence MR imaging of the left forefoot was performed. No intravenous contrast was administered. COMPARISON:  X-ray 06/21/2021, MRI 06/09/2021 FINDINGS: Bones/Joint/Cartilage Interval transmetatarsal amputation of the fifth ray. Mild bone marrow edema within the residual fifth metatarsal with preserved T1 bone marrow signal and no evidence of erosive change along the resection margin. There is also mild bone marrow edema along the base of the fourth toe proximal phalanx as well as faint marrow edema within the fourth metatarsal  head. Preserved T1 bone marrow signal at these locations. Similar degree of degenerative changes of the forefoot with moderate to severe first MTP joint arthropathy. No acute fracture or dislocation. Ligaments Intact Lisfranc ligament. No evidence of acute collateral ligament injury. Muscles and Tendons Denervation changes of the foot musculature with amputation changes along the lateral forefoot. No significant tenosynovial fluid collection. Soft tissues Generalized soft tissue swelling.  No organized fluid collection. IMPRESSION: 1. Interval transmetatarsal amputation of the fifth ray. Mild bone marrow edema within the residual fifth  metatarsal with preserved T1 bone marrow signal and no evidence of erosive change along the resection margin. Appearance is favored to reflect postsurgical change, although early changes of acute osteomyelitis would be difficult to exclude by imaging. 2. There is also mild bone marrow edema along the base of the fourth toe proximal phalanx as well as faint marrow edema within the fourth metatarsal head. Preserved T1 bone marrow signal at these locations. Findings are may represent reactive osteitis versus stress related changes. Electronically Signed   By: Davina Poke D.O.   On: 06/22/2021 17:18   CT Renal Stone Study  Result Date: 06/22/2021 CLINICAL DATA:  Right flank pain EXAM: CT ABDOMEN AND PELVIS WITHOUT CONTRAST TECHNIQUE: Multidetector CT imaging of the abdomen and pelvis was performed following the standard protocol without IV contrast. RADIATION DOSE REDUCTION: This exam was performed according to the departmental dose-optimization program which includes automated exposure control, adjustment of the mA and/or kV according to patient size and/or use of iterative reconstruction technique. COMPARISON:  Renal ultrasound 05/18/2019, CT 04/08/2016 FINDINGS: Lower chest: Mild right lower lobe bronchiectasis and round atelectasis and scarring as before. No acute airspace  disease. Hepatobiliary: Hepatic steatosis. Gallstones. No biliary dilatation. Pancreas: Unremarkable. No pancreatic ductal dilatation or surrounding inflammatory changes. Spleen: Normal in size without focal abnormality. Adrenals/Urinary Tract: Adrenal glands are unremarkable. Kidneys are normal, without renal calculi, focal lesion, or hydronephrosis. Bladder is unremarkable. Stomach/Bowel: Stomach is within normal limits. Appendix appears normal. No evidence of bowel wall thickening, distention, or inflammatory changes. Vascular/Lymphatic: Nonaneurysmal aorta. Borderline retroperitoneal lymph nodes measuring up to 16 mm but grossly stable. Reproductive: Prostate is unremarkable. Other: Negative for pelvic effusion or free air Musculoskeletal: No acute osseous abnormality IMPRESSION: 1. Negative for hydronephrosis or ureteral stone 2. Hepatic steatosis 3. Chronic bronchiectasis and round atelectasis at the right base 4. Gallstones without right upper quadrant inflammation Electronically Signed   By: Donavan Foil M.D.   On: 06/22/2021 01:51   DG Hand Complete Left  Result Date: 06/22/2021 CLINICAL DATA:  Finger pain EXAM: LEFT HAND - COMPLETE 3+ VIEW COMPARISON:  None Available. FINDINGS: No acute displaced fracture or malalignment. Old fifth metacarpal fracture. Degenerative changes at the D IP joints. Soft tissues are unremarkable IMPRESSION: 1. No acute osseous abnormality 2. Old fifth metacarpal fracture Electronically Signed   By: Donavan Foil M.D.   On: 06/22/2021 00:22   DG Foot Complete Left  Result Date: 06/22/2021 CLINICAL DATA:  Possible sepsis recent foot amputation EXAM: LEFT FOOT - COMPLETE 3+ VIEW COMPARISON:  06/10/2021 FINDINGS: Patient is status post transmetatarsal amputation of the fifth digit. Cut margins remain smooth. No osseous destructive change. Cutaneous staples remain in place. Degenerative changes at the first MTP joint. IMPRESSION: Stable transmetatarsal amputation of the fifth  digit without acute osseous abnormality Electronically Signed   By: Donavan Foil M.D.   On: 06/22/2021 00:21   DG Elbow Complete Left  Result Date: 06/22/2021 CLINICAL DATA:  Left elbow pain with swelling EXAM: LEFT ELBOW - COMPLETE 3+ VIEW COMPARISON:  None Available. FINDINGS: No fracture or malalignment. Mild degenerative changes on the ulnar side of the elbow. No significant elbow effusion IMPRESSION: No acute osseous abnormality Electronically Signed   By: Donavan Foil M.D.   On: 06/22/2021 00:16   DG Chest Port 1 View  Result Date: 06/22/2021 CLINICAL DATA:  Right flank pain and fever EXAM: PORTABLE CHEST 1 VIEW COMPARISON:  05/24/2021 FINDINGS: The heart size and mediastinal contours are within normal limits. Both  lungs are clear. The visualized skeletal structures are unremarkable. IMPRESSION: No active disease. Electronically Signed   By: Donavan Foil M.D.   On: 06/22/2021 00:15   DG Foot Complete Left  Result Date: 06/10/2021 CLINICAL DATA:  Post amputation of the fifth ray left foot. EXAM: LEFT FOOT - COMPLETE 3+ VIEW COMPARISON:  06/09/2021 FINDINGS: Interval postoperative changes with resection of the left fifth ray at the proximal metatarsal shaft level. Skin clips are present consistent with recent surgery. A margin of resection appears intact. No radiopaque foreign bodies or gas collections are identified in the soft tissues. Prominent degenerative changes are present in the first metatarsal-phalangeal joint. Degenerative changes also present in the interphalangeal and intertarsal joints. Mild dorsal soft tissue swelling. IMPRESSION: Postoperative changes with resection of the left fifth ray at the proximal metatarsal shaft level. Degenerative changes in the left foot. Electronically Signed   By: Lucienne Capers M.D.   On: 06/10/2021 20:12   VAS [place] LOWER EXTREMITY VENOUS (DVT)  Result Date: 06/10/2021  Lower Venous DVT Study Patient Name:  GABREIL YONKERS  Date of Exam:   06/09/2021  Medical Rec #: [id number]       Accession #:    [id number] Date of Birth: 19-Mar-1975       Patient Gender: M Patient Age:   63 years Exam Location:  [facility] Procedure:      VAS [place] LOWER EXTREMITY VENOUS (DVT) Referring Phys: DAVID ORTIZ --------------------------------------------------------------------------------  Indications: Edema.  Risk Factors: None identified. Limitations: Body habitus and poor ultrasound/tissue interface. Comparison Study: No prior studies. Performing Technologist: Oliver Hum RVT  Examination Guidelines: A complete evaluation includes B-mode imaging, spectral Doppler, color Doppler, and power Doppler as needed of all accessible portions of each vessel. Bilateral testing is considered an integral part of a complete examination. Limited examinations for reoccurring indications may be performed as noted. The reflux portion of the exam is performed with the patient in reverse Trendelenburg.  +-----+---------------+---------+-----------+----------+--------------+ RIGHTCompressibilityPhasicitySpontaneityPropertiesThrombus Aging +-----+---------------+---------+-----------+----------+--------------+ CFV  Full           Yes      Yes                                 +-----+---------------+---------+-----------+----------+--------------+   +---------+---------------+---------+-----------+----------+--------------+ LEFT     CompressibilityPhasicitySpontaneityPropertiesThrombus Aging +---------+---------------+---------+-----------+----------+--------------+ CFV      Full           Yes      Yes                                 +---------+---------------+---------+-----------+----------+--------------+ SFJ      Full                                                        +---------+---------------+---------+-----------+----------+--------------+ FV Prox  Full                                                         +---------+---------------+---------+-----------+----------+--------------+ FV Mid   Full                                                        +---------+---------------+---------+-----------+----------+--------------+  FV DistalFull                                                        +---------+---------------+---------+-----------+----------+--------------+ PFV      Full                                                        +---------+---------------+---------+-----------+----------+--------------+ POP      Full           Yes      Yes                                 +---------+---------------+---------+-----------+----------+--------------+ PTV      Full                                                        +---------+---------------+---------+-----------+----------+--------------+ PERO     Full                                                        +---------+---------------+---------+-----------+----------+--------------+    Summary: RIGHT: - No evidence of common femoral vein obstruction.  LEFT: - There is no evidence of deep vein thrombosis in the lower extremity. However, portions of this examination were limited- see technologist comments above.  - No cystic structure found in the popliteal fossa.  *See table(s) above for measurements and observations. Electronically signed by Monica Martinez MD on 06/10/2021 at 5:11:05 PM.    Final    VAS [place] ABI WITH/WO TBI  Result Date: 06/10/2021  LOWER EXTREMITY DOPPLER STUDY Patient Name:  ROCKEY GUARINO  Date of Exam:   06/09/2021 Medical Rec #: [id number]       Accession #:    [id number] Date of Birth: October 23, 1975       Patient Gender: M Patient Age:   61 years Exam [facility]  [facility] Procedure:      VAS [place] ABI WITH/WO TBI Referring Phys: DAVID ORTIZ --------------------------------------------------------------------------------  Indications: Ulceration. High Risk Factors: Hypertension, Diabetes.   Comparison Study: No prior studies. Performing Technologist: Carlos Levering RVT  Examination Guidelines: A complete evaluation includes at minimum, Doppler waveform signals and systolic blood pressure reading at the level of bilateral brachial, anterior tibial, and posterior tibial arteries, when vessel segments are accessible. Bilateral testing is considered an integral part of a complete examination. Photoelectric Plethysmograph (PPG) waveforms and toe systolic pressure readings are included as required and additional duplex testing as needed. Limited examinations for reoccurring indications may be performed as noted.  ABI Findings: +---------+------------------+-----+---------+--------+ Right    Rt Pressure (mmHg)IndexWaveform Comment  +---------+------------------+-----+---------+--------+ Brachial 96                     triphasic         +---------+------------------+-----+---------+--------+  PTA      95                0.85 triphasic         +---------+------------------+-----+---------+--------+ DP       93                0.83 triphasic         +---------+------------------+-----+---------+--------+ Great Toe102               0.91                   +---------+------------------+-----+---------+--------+ +---------+------------------+-----+---------+-------+ Left     Lt Pressure (mmHg)IndexWaveform Comment +---------+------------------+-----+---------+-------+ Brachial 112                    triphasic        +---------+------------------+-----+---------+-------+ PTA      90                0.80 biphasic         +---------+------------------+-----+---------+-------+ DP       94                0.84 biphasic         +---------+------------------+-----+---------+-------+ Great Toe78                0.70                  +---------+------------------+-----+---------+-------+ +-------+-----------+-----------+------------+------------+ ABI/TBIToday's  ABIToday's TBIPrevious ABIPrevious TBI +-------+-----------+-----------+------------+------------+ Right  0.85       0.91                                +-------+-----------+-----------+------------+------------+ Left   0.84       0.7                                 +-------+-----------+-----------+------------+------------+  Summary: Right: Resting right ankle-brachial index indicates mild right lower extremity arterial disease. The right toe-brachial index is normal. Left: Resting left ankle-brachial index indicates mild left lower extremity arterial disease. The left toe-brachial index is normal. *See table(s) above for measurements and observations.  Electronically signed by Monica Martinez MD on 06/10/2021 at 5:07:49 PM.    Final    MR FOOT LEFT WO CONTRAST  Result Date: 06/09/2021 CLINICAL DATA:  Foot swelling, diabetic, osteomyelitis suspected, xray done EXAM: MRI OF THE LEFT FOOT WITHOUT CONTRAST TECHNIQUE: Multiplanar, multisequence MR imaging of the left forefoot was performed. No intravenous contrast was administered. COMPARISON:  X-ray 06/09/2021 FINDINGS: Bones/Joint/Cartilage Shallow soft tissue ulceration at the plantar-lateral aspect of the distal forefoot adjacent to the fifth metatarsal head with bone marrow edema present in the fifth metatarsal head and fifth toe proximal phalanx. No erosion. Fatty T1 marrow signal at the sites is preserved. Small fifth MTP joint effusion. Moderate-severe arthropathy of the first MTP joint with well-defined marginal erosions along the first metatarsal head suggesting sequela of prior crystalline arthropathy. Hallux valgus deformity. Mild degenerative changes within the midfoot. Mild edema within the cuboid is likely reactive or stress related. Ligaments Intact Lisfranc ligament. Collateral ligaments of the forefoot appear intact. Muscles and Tendons Denervation changes of the foot musculature. Intact flexor and extensor tendons. No  tenosynovitis. Soft tissues Soft tissue ulceration, as above. Diffuse soft tissue swelling. Ill-defined fluid and edema over the dorsal aspect of the forefoot. No organized fluid collection evident  on noncontrast images. IMPRESSION: 1. Shallow soft tissue ulceration at the plantar-lateral aspect of the distal forefoot adjacent to the fifth metatarsal head. Marrow edema in the fifth metatarsal head and fifth toe proximal phalanx suspicious for early acute osteomyelitis. 2. Small fifth MTP joint effusion concerning for septic arthritis. 3. Hallux valgus deformity with moderate-severe arthropathy of the first MTP joint with well-defined marginal erosions along the first metatarsal head suggesting sequela of prior crystalline arthropathy such as gout. Electronically Signed   By: Davina Poke D.O.   On: 06/09/2021 16:38   DG Foot Complete Left  Result Date: 06/09/2021 CLINICAL DATA:  Left foot pain. Concern for osteomyelitis. Chronic wound to the distal aspect of the plantar surface of the left foot next to the fifth toe. Discharge and pain. EXAM: LEFT FOOT - COMPLETE 3+ VIEW COMPARISON:  None Available. FINDINGS: Degenerative changes at the first MTP joint. No fractures. The reported chronic wound is identified near the fifth MTP joint. A punctate focus of high attenuation is identified within the wound on the oblique view. There is no bony erosion underlying the wound. Degenerative changes in the midfoot. IMPRESSION: 1. The patient's reported chronic wound is identified adjacent to the fifth MTP joint. There is a punctate focus of high attenuation projected over the wound on the oblique view which could represent a tiny foreign body. 2. No bony erosion to suggest osteomyelitis. MRI would be more sensitive. Electronically Signed   By: Dorise Bullion III M.D.   On: 06/09/2021 08:23     The results of significant diagnostics from this hospitalization (including imaging, microbiology, ancillary and laboratory)  are listed below for reference.     Microbiology: Recent Results (from the past 240 hour(s))  Blood Culture (routine x 2)     Status: None (Preliminary result)   Collection Time: 06/21/21 11:25 PM   Specimen: BLOOD  Result Value Ref Range Status   Specimen Description   Final    BLOOD RIGHT ANTECUBITAL Performed at [facility] [address] [place], [place] [address]    Special Requests   Final    BOTTLES DRAWN AEROBIC AND ANAEROBIC Blood Culture results may not be optimal due to an excessive volume of blood received in culture bottles Performed at [facility] [address], [place], [place] [address]    Culture   Final    NO GROWTH 4 DAYS Performed at [facility], [place] [address], [place], [place] [address]    Report Status PENDING  Incomplete  MRSA Next Gen by PCR, Nasal     Status: None   Collection Time: 06/22/21  1:38 PM   Specimen: Nasal Mucosa; Nasal Swab  Result Value Ref Range Status   MRSA by PCR Next Gen NOT DETECTED NOT DETECTED Final    Comment: (NOTE) The GeneXpert MRSA Assay (FDA approved for NASAL specimens only), is one component of a comprehensive MRSA colonization surveillance program. It is not intended to diagnose MRSA infection nor to guide or monitor treatment for MRSA infections. Test performance is not FDA approved in patients less than 97 years old. Performed at [facility], [place] [address], [place],  [address]   Blood Culture (routine x 2)     Status: None (Preliminary result)   Collection Time: 06/23/21  4:59 AM   Specimen: BLOOD  Result Value Ref Range Status   Specimen Description   Final    BLOOD LEFT ANTECUBITAL Performed at [facility], [place] [address] [place], [place] [address]    Special Requests   Final    BOTTLES DRAWN AEROBIC ONLY Blood Culture adequate volume Performed at [facility] [address], [place],  [place] [address]    Culture   Final    NO GROWTH 3 DAYS Performed at [facility], [place] [address], [place], [place] [address]    Report Status PENDING  Incomplete     Labs: BNP (last 3 results) No results for input(s): "BNP" in the last 8760 hours. Basic Metabolic Panel: Recent Labs  Lab 06/23/21 0459 06/24/21 0444 06/25/21 0532 06/26/21 0441 06/27/21 0505  NA 136 136 131* 135 133*  K 4.3 4.9 4.8 4.8 4.8  CL 105 104 102 104 104  CO2 22 22 19* 21* 20*  GLUCOSE 171* 320* 329* 298* 347*  BUN 31* 40* 56* 64* 61*  CREATININE 1.81* 2.05* 2.10* 2.06* 1.69*  CALCIUM 8.8* 8.9 8.4* 8.7* 8.8*  MG 1.9 2.1 2.1 2.0 2.0  PHOS 3.4 3.2 3.3 3.4  --    Liver Function Tests: Recent Labs  Lab 06/22/21 0615 06/23/21 0459 06/24/21 0444 06/25/21 0532 06/26/21 0441  AST 12* 14* 19 16 15   ALT 14 13 16 17 16   ALKPHOS 50 55 57 59 54  BILITOT 0.8 0.7 0.4 0.4 0.4  PROT 7.2 7.2 7.5 7.3 7.5  ALBUMIN 2.8* 2.7* 2.6* 2.7* 2.8*   No results for input(s): "LIPASE", "AMYLASE" in the last 168 hours. No results for input(s): "AMMONIA" in the last 168 hours. CBC: Recent Labs  Lab 06/22/21 0615 06/23/21 0459 06/24/21 0444 06/25/21 0532 06/26/21 0441 06/27/21 0505  WBC 12.6* 10.2 14.2* 17.7* 15.0* 16.4*  NEUTROABS 8.5* 6.3 11.9* 14.9* 12.3*  --   HGB 11.3* 11.3* 11.6* 11.4* 11.6* 12.2*  HCT 34.2* 34.3* 34.5* 34.2* 35.5* 36.5*  MCV 91.4 92.0 90.3 92.2 91.7 91.0  PLT 324 313 359 374 423* 465*   Cardiac Enzymes: No results for input(s): "CKTOTAL", "CKMB", "CKMBINDEX", "TROPONINI" in the last 168 hours. BNP: Invalid input(s): "POCBNP" CBG: Recent Labs  Lab 06/26/21 0735 06/26/21 1229 06/26/21 1641 06/26/21 2136 06/27/21 0743  GLUCAP 258* 221* 223* 284* 282*   D-Dimer No results for input(s): "DDIMER" in the last 72 hours. Hgb A1c No results for input(s): "HGBA1C" in the last 72 hours. Lipid Profile No results for input(s): "CHOL", "HDL", "LDLCALC", "TRIG", "CHOLHDL", "LDLDIRECT" in  the last 72 hours. Thyroid function studies No results for input(s): "TSH", "T4TOTAL", "T3FREE", "THYROIDAB" in the last 72 hours.  Invalid input(s): "FREET3" Anemia work up No results for input(s): "VITAMINB12", "FOLATE", "FERRITIN", "TIBC", "IRON", "RETICCTPCT" in the last 72 hours. Urinalysis    Component Value Date/Time   COLORURINE YELLOW 06/21/2021 2236   APPEARANCEUR CLEAR 06/21/2021 2236   LABSPEC 1.017 06/21/2021 2236   PHURINE 5.0 06/21/2021 2236   GLUCOSEU >=500 (A) 06/21/2021 2236   HGBUR NEGATIVE 06/21/2021 2236   BILIRUBINUR NEGATIVE 06/21/2021 2236   KETONESUR NEGATIVE 06/21/2021 2236   PROTEINUR >=300 (A) 06/21/2021 2236   NITRITE NEGATIVE 06/21/2021 2236   LEUKOCYTESUR NEGATIVE 06/21/2021 2236   Sepsis Labs Recent Labs  Lab 06/24/21 0444 06/25/21 0532 06/26/21 0441 06/27/21 0505  WBC 14.2* 17.7* 15.0* 16.4*   Microbiology Recent Results (from the past 240 hour(s))  Blood Culture (routine x 2)     Status: None (Preliminary result)   Collection Time: 06/21/21 11:25 PM   Specimen: BLOOD  Result Value Ref Range Status   Specimen  Description   Final    BLOOD RIGHT ANTECUBITAL Performed at [facility] [address], [place], [place] [address]    Special Requests   Final    BOTTLES DRAWN AEROBIC AND ANAEROBIC Blood Culture results may not be optimal due to an excessive volume of blood received in culture bottles Performed at [facility] [address], [place], [place] [address]    Culture   Final    NO GROWTH 4 DAYS Performed at [facility], [place] [address] [place], [place] [address]    Report Status PENDING  Incomplete  MRSA Next Gen by PCR, Nasal     Status: None   Collection Time: 06/22/21  1:38 PM   Specimen: Nasal Mucosa; Nasal Swab  Result Value Ref Range Status   MRSA by PCR Next Gen NOT DETECTED NOT DETECTED Final    Comment: (NOTE) The GeneXpert MRSA Assay (FDA approved for NASAL specimens  only), is one component of a comprehensive MRSA colonization surveillance program. It is not intended to diagnose MRSA infection nor to guide or monitor treatment for MRSA infections. Test performance is not FDA approved in patients less than 10 years old. Performed at [facility], [place] [address] [place], [place] [address]   Blood Culture (routine x 2)     Status: None (Preliminary result)   Collection Time: 06/23/21  4:59 AM   Specimen: BLOOD  Result Value Ref Range Status   Specimen Description   Final    BLOOD LEFT ANTECUBITAL Performed at [facility] [address], [place], [place] [address]    Special Requests   Final    BOTTLES DRAWN AEROBIC ONLY Blood Culture adequate volume Performed at [facility] [address], [place], [place] [address]    Culture   Final    NO GROWTH 3 DAYS Performed at [facility], [place] [address] [place], [place] [address]    Report Status PENDING  Incomplete     Time coordinating discharge:  I have spent 35 minutes face to face with the patient and on the ward discussing the patients care, assessment, plan and disposition with other care givers. >50% of the time was devoted counseling the patient about the risks and benefits of treatment/Discharge disposition and coordinating care.   SIGNED:   Damita Lack, MD  [facility] 06/27/2021, 11:59 AM   If 7PM-7AM, please contact night-coverage

## 2021-06-28 LAB — CULTURE, BLOOD (ROUTINE X 2)
Culture: NO GROWTH
Special Requests: ADEQUATE

## 2021-07-01 ENCOUNTER — Other Ambulatory Visit: Payer: Self-pay | Admitting: Podiatry

## 2021-07-01 ENCOUNTER — Telehealth: Payer: Self-pay | Admitting: Podiatry

## 2021-07-01 MED ORDER — OXYCODONE-ACETAMINOPHEN 5-325 MG PO TABS: 1.0000 | ORAL_TABLET | Freq: Four times a day (QID) | ORAL | 0 refills | Status: DC | PRN

## 2021-07-01 NOTE — Telephone Encounter (Signed)
Prescription sent to the pharmacy.  Thanks monitor [employer]

## 2021-07-01 NOTE — Telephone Encounter (Addendum)
Pt is asking for Rx for pain for L foot to the [facility]  in [place]  in [place].  Please advise.

## 2021-07-02 DIAGNOSIS — Z6841 Body Mass Index (BMI) 40.0 and over, adult: Secondary | ICD-10-CM | POA: Diagnosis not present

## 2021-07-02 DIAGNOSIS — L03116 Cellulitis of left lower limb: Secondary | ICD-10-CM | POA: Diagnosis not present

## 2021-07-08 ENCOUNTER — Telehealth: Payer: Self-pay | Admitting: Podiatry

## 2021-07-08 NOTE — Telephone Encounter (Signed)
Pt called asking for a RX refill for pain. Pt has appt 6/26  Please advise.

## 2021-07-09 ENCOUNTER — Other Ambulatory Visit: Payer: Self-pay | Admitting: Podiatry

## 2021-07-09 MED ORDER — OXYCODONE-ACETAMINOPHEN 5-325 MG PO TABS: 1.0000 | ORAL_TABLET | Freq: Three times a day (TID) | ORAL | 0 refills | Status: DC | PRN

## 2021-07-09 NOTE — Telephone Encounter (Signed)
Prescription sent to the pharmacy.  Thanks, Dr. Mike Gip

## 2021-07-10 NOTE — Telephone Encounter (Signed)
Spoke with patient and he is aware rx has been sent to the pharmacy for pickup.

## 2021-07-15 ENCOUNTER — Ambulatory Visit (INDEPENDENT_AMBULATORY_CARE_PROVIDER_SITE_OTHER): Payer: BC Managed Care – PPO | Admitting: Podiatry

## 2021-07-15 ENCOUNTER — Ambulatory Visit (INDEPENDENT_AMBULATORY_CARE_PROVIDER_SITE_OTHER): Payer: BC Managed Care – PPO

## 2021-07-15 DIAGNOSIS — Z9889 Other specified postprocedural states: Secondary | ICD-10-CM | POA: Diagnosis not present

## 2021-07-18 ENCOUNTER — Telehealth: Payer: Self-pay | Admitting: Podiatry

## 2021-07-18 NOTE — Telephone Encounter (Signed)
Patient called and stated he was seen in the [place] office on Monday. He was suppose to receive pain medication at the [facility] on [address].  Please advise

## 2021-07-24 DIAGNOSIS — E039 Hypothyroidism, unspecified: Secondary | ICD-10-CM | POA: Diagnosis not present

## 2021-07-24 DIAGNOSIS — E113292 Type 2 diabetes mellitus with mild nonproliferative diabetic retinopathy without macular edema, left eye: Secondary | ICD-10-CM | POA: Diagnosis not present

## 2021-07-24 DIAGNOSIS — E782 Mixed hyperlipidemia: Secondary | ICD-10-CM | POA: Diagnosis not present

## 2021-07-24 DIAGNOSIS — Z7985 Long-term (current) use of injectable non-insulin antidiabetic drugs: Secondary | ICD-10-CM | POA: Diagnosis not present

## 2021-07-24 DIAGNOSIS — Z794 Long term (current) use of insulin: Secondary | ICD-10-CM | POA: Diagnosis not present

## 2021-07-24 DIAGNOSIS — Z7984 Long term (current) use of oral hypoglycemic drugs: Secondary | ICD-10-CM | POA: Diagnosis not present

## 2021-07-29 ENCOUNTER — Other Ambulatory Visit: Payer: Self-pay | Admitting: Podiatry

## 2021-07-29 MED ORDER — OXYCODONE-ACETAMINOPHEN 5-325 MG PO TABS: 1.0000 | ORAL_TABLET | Freq: Three times a day (TID) | ORAL | 0 refills | Status: DC | PRN

## 2021-07-29 NOTE — Telephone Encounter (Signed)
Refill sent to the pharmacy.  Thanks, Dr. Jamey Demchak

## 2021-08-01 ENCOUNTER — Telehealth: Payer: Self-pay | Admitting: Podiatry

## 2021-08-01 ENCOUNTER — Encounter: Payer: Self-pay | Admitting: Podiatry

## 2021-08-01 NOTE — Telephone Encounter (Signed)
pt would like to return to work jon 08/05/21 with increased breaks. Is that ok?

## 2021-08-19 ENCOUNTER — Ambulatory Visit (INDEPENDENT_AMBULATORY_CARE_PROVIDER_SITE_OTHER): Payer: BC Managed Care – PPO

## 2021-08-19 ENCOUNTER — Ambulatory Visit (INDEPENDENT_AMBULATORY_CARE_PROVIDER_SITE_OTHER): Payer: BC Managed Care – PPO | Admitting: Podiatry

## 2021-08-19 DIAGNOSIS — L97512 Non-pressure chronic ulcer of other part of right foot with fat layer exposed: Secondary | ICD-10-CM

## 2021-08-19 DIAGNOSIS — L97522 Non-pressure chronic ulcer of other part of left foot with fat layer exposed: Secondary | ICD-10-CM

## 2021-08-19 MED ORDER — GENTAMICIN SULFATE 0.1 % EX OINT: 1.0000 | TOPICAL_OINTMENT | Freq: Every day | CUTANEOUS | 0 refills | Status: DC

## 2021-08-19 NOTE — Progress Notes (Signed)
   Chief Complaint  Patient presents with   post op visit for left foot  partial fifth ray amputation.    Post op visit for left foot fifth ray partial amputation.    Subjective:  Patient presents today status post partial fifth ray amputation of the left foot performed inpatient. DOS: 06/10/2021.  Patient states that he continues to do well.  He has been applying the Betadine ointment as instructed.  No new complaints at this time  Past Medical History:  Diagnosis Date   Carpal tunnel syndrome    Diabetes mellitus without complication (HCC)    Gout    Hypercholesteremia    Hypothyroidism    Neuropathy    Obesity    Thyroid disease      Past Surgical History:  Procedure Laterality Date   AMPUTATION Left 06/10/2021   Procedure: 5th RAY AMPUTATION LEFT FOOT;  Surgeon: [name], DPM;  Location: [facility];  Service: Podiatry;  Laterality: Left;   FRACTURE SURGERY     INCISE AND DRAIN ABCESS     IRRIGATION AND DEBRIDEMENT FOOT Right 04/27/2019   Procedure: Debridement and Irrigation with possible placement of antibiotic beads;  Surgeon: Park Liter, DPM;  Location: [facility];  Service: Podiatry;  Laterality: Right;   ORIF TOE FRACTURE Right 04/27/2019   Procedure: Open Reduction vs Excision of Fracture Fragment Right great toe, with pinning.;  Surgeon: Park Liter, DPM;  Location: [facility];  Service: Podiatry;  Laterality: Right;   TONSILLECTOMY     No Known Allergies       Objective/Physical Exam The amputation site to the lateral aspect of the left forefoot has healed completely with the exception of a focal area of dehiscence to the distal wound.  Granular wound base.  There is some slight hyperkeratotic callus tissue around the area.  No odor.  No appreciable drainage until after debridement there was some slight bleeding. The wound to the plantar aspect of the right great toe appears to be healing completely.  No open wound or granular tissue.  After debridement of this  area and removal of the hyperkeratotic skin there was healthy underlying skin.  No erythema or edema  Assessment: 1. s/p partial fifth ray amputation left foot. DOS: 06/10/2021 2.  Ulcer right foot plantar great toe 3.  Ulcer left lateral forefoot   Plan of Care:  1. Patient was evaluated.  2.  Light debridement of the hyperkeratotic callus tissue around the plantar aspect of the right great toe as well as the amputation site to the left lateral forefoot was performed using a tissue nipper.  There was some slight bleeding with healthy underlying granular tissue to the distal amputation site of the lateral left foot. 3.  Prescription for gentamicin cream.  Discontinue the Betadine ointment.  Recommend gentamicin cream 2 times daily 4.  Diabetic shoe fitting is pending currently.  In the meantime recommend good supportive shoes and sneakers 5.  Return to clinic 4 weeks   [name], DPM [facility]  Dr. [name], DPM    [address] [place], [place] [address]                Office [phone]  Fax [phone]

## 2021-09-25 ENCOUNTER — Ambulatory Visit: Payer: BC Managed Care – PPO | Admitting: Podiatry

## 2021-10-10 ENCOUNTER — Encounter (HOSPITAL_COMMUNITY): Payer: Self-pay

## 2021-10-10 ENCOUNTER — Emergency Department (HOSPITAL_COMMUNITY)
Admission: EM | Admit: 2021-10-10 | Discharge: 2021-10-11 | Disposition: A | Discharge status: Home / Self Care | Payer: BC Managed Care – PPO | Attending: Emergency Medicine | Admitting: Emergency Medicine

## 2021-10-10 ENCOUNTER — Other Ambulatory Visit: Payer: Self-pay

## 2021-10-10 DIAGNOSIS — E119 Type 2 diabetes mellitus without complications: Secondary | ICD-10-CM | POA: Insufficient documentation

## 2021-10-10 DIAGNOSIS — J0100 Acute maxillary sinusitis, unspecified: Secondary | ICD-10-CM | POA: Insufficient documentation

## 2021-10-10 DIAGNOSIS — G501 Atypical facial pain: Secondary | ICD-10-CM | POA: Diagnosis not present

## 2021-10-10 DIAGNOSIS — E039 Hypothyroidism, unspecified: Secondary | ICD-10-CM | POA: Insufficient documentation

## 2021-10-10 NOTE — ED Triage Notes (Signed)
Ambulatory to ED with c/o facial pain. States he feels like he has a lot of sinus pain and also a lot of dental pain.

## 2021-10-10 NOTE — ED Provider Triage Note (Signed)
Emergency Medicine Provider Triage Evaluation Note  Barry [name] , a 46 y.o. male  was evaluated in triage.  Pt complains of bilateral maxillary sinus tenderness for the past week, but significantly worsened over the past 2 days. Denies any rhinorrhea, nasal congestion, fever, or sore throat. The aptient also complains or upper dental pain. Has not seen a dentist in a few years.  Review of Systems  Positive:  Negative:   Physical Exam  BP (!) 223/122 (BP Location: Right Arm)   Pulse 90   Temp 98.9 F (37.2 C) (Oral)   Resp 20   Ht 6\' 3"  (1.905 m)   Wt (!) 147.4 kg   SpO2 94%   BMI 40.62 kg/m  Gen:   Awake, no distress   Resp:  Normal effort  MSK:   Moves extremities without difficulty  Other:  Moderate amount of tenderness to his bilateral maxillary sinuses. No crepitus. He does have several missing teeth with erythematous holes and a broken molar.   Medical Decision Making  Medically screening exam initiated at 11:35 PM.  Appropriate orders placed.  Barry [name] was informed that the remainder of the evaluation will be completed by another provider, this initial triage assessment does not replace that evaluation, and the importance of remaining in the ED until their evaluation is complete.  Significant HTN. Patient reports compliancy with his meds. Will order blood work for elevated BP. Differ imaging until he is further evaluated in the back.    Sherrell Puller, [place] 10/10/21 2337

## 2021-10-11 ENCOUNTER — Emergency Department (HOSPITAL_COMMUNITY): Payer: BC Managed Care – PPO

## 2021-10-11 LAB — COMPREHENSIVE METABOLIC PANEL
ALT: 19 U/L (ref 0–44)
AST: 17 U/L (ref 15–41)
Albumin: 3.3 g/dL — ABNORMAL LOW (ref 3.5–5.0)
Alkaline Phosphatase: 63 U/L (ref 38–126)
Anion gap: 8 (ref 5–15)
BUN: 32 mg/dL — ABNORMAL HIGH (ref 6–20)
CO2: 21 mmol/L — ABNORMAL LOW (ref 22–32)
Calcium: 8.7 mg/dL — ABNORMAL LOW (ref 8.9–10.3)
Chloride: 108 mmol/L (ref 98–111)
Creatinine, Ser: 1.92 mg/dL — ABNORMAL HIGH (ref 0.61–1.24)
GFR, Estimated: 44 mL/min — ABNORMAL LOW (ref >60)
Glucose, Bld: 283 mg/dL — ABNORMAL HIGH (ref 70–99)
Potassium: 3.9 mmol/L (ref 3.5–5.1)
Sodium: 137 mmol/L (ref 135–145)
Total Bilirubin: 0.7 mg/dL (ref 0.3–1.2)
Total Protein: 7.6 g/dL (ref 6.5–8.1)

## 2021-10-11 LAB — CBC WITH DIFFERENTIAL/PLATELET
Abs Immature Granulocytes: 0.03 10*3/uL (ref 0.00–0.07)
Basophils Absolute: 0 10*3/uL (ref 0.0–0.1)
Basophils Relative: 1 %
Eosinophils Absolute: 0.3 10*3/uL (ref 0.0–0.5)
Eosinophils Relative: 4 %
HCT: 40.9 % (ref 39.0–52.0)
Hemoglobin: 14.2 g/dL (ref 13.0–17.0)
Immature Granulocytes: 0 %
Lymphocytes Relative: 27 %
Lymphs Abs: 2.3 10*3/uL (ref 0.7–4.0)
MCH: 31.6 pg (ref 26.0–34.0)
MCHC: 34.7 g/dL (ref 30.0–36.0)
MCV: 91.1 fL (ref 80.0–100.0)
Monocytes Absolute: 0.9 10*3/uL (ref 0.1–1.0)
Monocytes Relative: 11 %
Neutro Abs: 5.1 10*3/uL (ref 1.7–7.7)
Neutrophils Relative %: 57 %
Platelets: 283 10*3/uL (ref 150–400)
RBC: 4.49 MIL/uL (ref 4.22–5.81)
RDW: 13.6 % (ref 11.5–15.5)
WBC: 8.7 10*3/uL (ref 4.0–10.5)
nRBC: 0 % (ref 0.0–0.2)

## 2021-10-11 LAB — URINALYSIS, ROUTINE W REFLEX MICROSCOPIC
Bacteria, UA: NONE SEEN
Bilirubin Urine: NEGATIVE
Glucose, UA: >=500 mg/dL — AB
Ketones, ur: NEGATIVE mg/dL
Leukocytes,Ua: NEGATIVE
Nitrite: NEGATIVE
Protein, ur: 100 mg/dL — AB
Specific Gravity, Urine: 1.014 (ref 1.005–1.030)
pH: 6 (ref 5.0–8.0)

## 2021-10-11 MED ORDER — FLUTICASONE PROPIONATE 50 MCG/ACT NA SUSP: 2.0000 | Freq: Every day | NASAL | 0 refills | Status: DC

## 2021-10-11 MED ORDER — FLUCONAZOLE 200 MG PO TABS
200.0000 mg | ORAL_TABLET | Freq: Once | ORAL | Status: AC
  Administered 2021-10-11: 200 mg via ORAL
  Filled 2021-10-11: qty 1

## 2021-10-11 MED ORDER — HYDROCODONE-ACETAMINOPHEN 5-325 MG PO TABS: 1.0000 | ORAL_TABLET | ORAL | 0 refills | Status: DC | PRN

## 2021-10-11 MED ORDER — AMOXICILLIN-POT CLAVULANATE 875-125 MG PO TABS
1.0000 | ORAL_TABLET | Freq: Two times a day (BID) | ORAL | 0 refills | Status: DC
Start: 2021-10-11 — End: 2021-11-23

## 2021-10-11 MED ORDER — HYDROCODONE-ACETAMINOPHEN 5-325 MG PO TABS
1.0000 | ORAL_TABLET | Freq: Once | ORAL | Status: AC
  Administered 2021-10-11: 1 via ORAL
  Filled 2021-10-11: qty 1

## 2021-10-11 NOTE — Discharge Instructions (Signed)
You were evaluated in the Emergency Department and after careful evaluation, we did not find any emergent condition requiring admission or further testing in the hospital.  Your exam/testing today was overall reassuring.  Symptoms may be due to a sinus infection.  Take the Augmentin antibiotic as directed.  Can use the Norco medicine for pain that is keeping you from sleeping.  Also recommend the use of the Flonase.  Please return to the Emergency Department if you experience any worsening of your condition.  Thank you for allowing [place] to be a part of your care.

## 2021-10-11 NOTE — ED Notes (Signed)
Pt. Gone to CT 

## 2021-10-11 NOTE — ED Provider Notes (Signed)
[facility] Emergency Department Provider Note MRN:  [id number]  Arrival date & time: 10/11/21     Chief Complaint   Facial Pain   History of Present Illness   Barry [name] is a 46 y.o. year-old male with a history of diabetes presenting to the ED with chief complaint of facial pain.  1 week of pain and pressure behind the nose, maxillary area bilaterally, pain and pressure above the maxillary teeth.  A lot worse over the past few hours, trouble sleeping.  Denies cold-like symptoms, no fever, no cough.  Has some pressure in the ears as well.  Review of Systems  A thorough review of systems was obtained and all systems are negative except as noted in the HPI and PMH.   Patient's Health History    Past Medical History:  Diagnosis Date   Carpal tunnel syndrome    Diabetes mellitus without complication (HCC)    Gout    Hypercholesteremia    Hypothyroidism    Neuropathy    Obesity    Thyroid disease     Past Surgical History:  Procedure Laterality Date   AMPUTATION Left 06/10/2021   Procedure: 5th RAY AMPUTATION LEFT FOOT;  Surgeon: [name], DPM;  Location: [facility];  Service: Podiatry;  Laterality: Left;   FRACTURE SURGERY     INCISE AND DRAIN ABCESS     IRRIGATION AND DEBRIDEMENT FOOT Right 04/27/2019   Procedure: Debridement and Irrigation with possible placement of antibiotic beads;  Surgeon: Park Liter, DPM;  Location: [facility];  Service: Podiatry;  Laterality: Right;   ORIF TOE FRACTURE Right 04/27/2019   Procedure: Open Reduction vs Excision of Fracture Fragment Right great toe, with pinning.;  Surgeon: Park Liter, DPM;  Location: [facility];  Service: Podiatry;  Laterality: Right;   TONSILLECTOMY      History reviewed. No pertinent family history.  Social History   Socioeconomic History   Marital status: Widowed    Spouse name: Not on file   Number of children: Not on file   Years of education: Not on file   Highest education  level: Not on file  Occupational History   Not on file  Tobacco Use   Smoking status: Never   Smokeless tobacco: Never  Vaping Use   Vaping Use: Never used  Substance and Sexual Activity   Alcohol use: Yes   Drug use: No   Sexual activity: Not on file  Other Topics Concern   Not on file  Social History Narrative   Not on file   Social Determinants of Health   Financial Resource Strain: Not on file  Food Insecurity: Not on file  Transportation Needs: Not on file  Physical Activity: Not on file  Stress: Not on file  Social Connections: Not on file  Intimate Partner Violence: Not on file     Physical Exam   Vitals:   10/11/21 0245 10/11/21 0300  BP: (!) 156/96 (!) 151/104  Pulse: 74 78  Resp:    Temp:    SpO2: 98% 97%    CONSTITUTIONAL: Well-appearing, NAD NEURO/PSYCH:  Alert and oriented x 3, no focal deficits EYES:  eyes equal and reactive ENT/NECK:  no LAD, no JVD CARDIO: Regular rate, well-perfused, normal S1 and S2 PULM:  CTAB no wheezing or rhonchi GI/GU:  non-distended, non-tender MSK/SPINE:  No gross deformities, no edema SKIN:  no rash, atraumatic   *Additional and/or pertinent findings included in MDM below  Diagnostic and Interventional  Summary    EKG Interpretation  Date/Time:    Ventricular Rate:    PR Interval:    QRS Duration:   QT Interval:    QTC Calculation:   R Axis:     Text Interpretation:         Labs Reviewed  COMPREHENSIVE METABOLIC PANEL - Abnormal; Notable for the following components:      Result Value   CO2 21 (*)    Glucose, Bld 283 (*)    BUN 32 (*)    Creatinine, Ser 1.92 (*)    Calcium 8.7 (*)    Albumin 3.3 (*)    GFR, Estimated 44 (*)    All other components within normal limits  URINALYSIS, ROUTINE W REFLEX MICROSCOPIC - Abnormal; Notable for the following components:   Color, Urine STRAW (*)    Glucose, UA >=500 (*)    Hgb urine dipstick SMALL (*)    Protein, ur 100 (*)    All other components within  normal limits  CBC WITH DIFFERENTIAL/PLATELET    CT HEAD WO CONTRAST ( )  Final Result    CT Maxillofacial Wo Contrast  Final Result      Medications  fluconazole (DIFLUCAN) tablet 200 mg (200 mg Oral Given 10/11/21 0157)  HYDROcodone-acetaminophen (NORCO/VICODIN) 5-325 MG per tablet 1 tablet (1 tablet Oral Given 10/11/21 0156)     Procedures  /  Critical Care Procedures  ED Course and Medical Decision Making  Initial Impression and Ddx Patient has reproducible pain with palpation of the maxillary sinuses, suspect sinus infection.  A bit odd that it is not associated with cough or cold-like symptoms.  Patient has poorly controlled diabetes and so invasive fungal infection is considered, will obtain CT imaging.  Past medical/surgical history that increases complexity of ED encounter: Diabetes  Interpretation of Diagnostics CT imaging is normal.  Patient Reassessment and Ultimate Disposition/Management     Appropriate for discharge.  Patient management required discussion with the following services or consulting groups:  None  Complexity of Problems Addressed Acute illness or injury that poses threat of life of bodily function  Additional Data Reviewed and Analyzed Further history obtained from: None  Additional Factors Impacting ED Encounter Risk Prescriptions  Elmer Sow. Pilar Plate, MD [facility] Emergency Medicine [facility] [email or web address]  Final Clinical Impressions(s) / ED Diagnoses     ICD-10-CM   1. Acute non-recurrent maxillary sinusitis  J01.00       ED Discharge Orders          Ordered    amoxicillin-clavulanate (AUGMENTIN) 875-125 MG tablet  Every 12 hours        10/11/21 0309    HYDROcodone-acetaminophen (NORCO/VICODIN) 5-325 MG tablet  Every 4 hours PRN        10/11/21 0309    fluticasone (FLONASE) 50 MCG/ACT nasal spray  Daily        10/11/21 0310             Discharge Instructions Discussed with and Provided to  Patient:     Discharge Instructions      You were evaluated in the Emergency Department and after careful evaluation, we did not find any emergent condition requiring admission or further testing in the hospital.  Your exam/testing today was overall reassuring.  Symptoms may be due to a sinus infection.  Take the Augmentin antibiotic as directed.  Can use the Norco medicine for pain that is keeping you from sleeping.  Also recommend the use of the  Flonase.  Please return to the Emergency Department if you experience any worsening of your condition.  Thank you for allowing [place] to be a part of your care.        Maudie Flakes, MD 10/11/21 [phone]

## 2021-11-20 ENCOUNTER — Encounter (HOSPITAL_COMMUNITY): Payer: Self-pay

## 2021-11-20 ENCOUNTER — Emergency Department (HOSPITAL_COMMUNITY): Payer: BC Managed Care – PPO

## 2021-11-20 ENCOUNTER — Other Ambulatory Visit: Payer: Self-pay

## 2021-11-20 ENCOUNTER — Inpatient Hospital Stay (HOSPITAL_COMMUNITY)
Admission: EM | Admit: 2021-11-20 | Discharge: 2021-11-23 | DRG: 417 | Disposition: A | Discharge status: Home / Self Care | Payer: BC Managed Care – PPO | Attending: Internal Medicine | Admitting: Internal Medicine

## 2021-11-20 DIAGNOSIS — E039 Hypothyroidism, unspecified: Secondary | ICD-10-CM | POA: Diagnosis present

## 2021-11-20 DIAGNOSIS — N1831 Chronic kidney disease, stage 3a: Secondary | ICD-10-CM | POA: Diagnosis present

## 2021-11-20 DIAGNOSIS — E871 Hypo-osmolality and hyponatremia: Secondary | ICD-10-CM | POA: Diagnosis present

## 2021-11-20 DIAGNOSIS — K859 Acute pancreatitis without necrosis or infection, unspecified: Secondary | ICD-10-CM | POA: Diagnosis present

## 2021-11-20 DIAGNOSIS — K8050 Calculus of bile duct without cholangitis or cholecystitis without obstruction: Secondary | ICD-10-CM | POA: Diagnosis present

## 2021-11-20 DIAGNOSIS — E1122 Type 2 diabetes mellitus with diabetic chronic kidney disease: Secondary | ICD-10-CM | POA: Diagnosis not present

## 2021-11-20 DIAGNOSIS — Z89422 Acquired absence of other left toe(s): Secondary | ICD-10-CM | POA: Diagnosis not present

## 2021-11-20 DIAGNOSIS — Z79899 Other long term (current) drug therapy: Secondary | ICD-10-CM | POA: Diagnosis not present

## 2021-11-20 DIAGNOSIS — E7800 Pure hypercholesterolemia, unspecified: Secondary | ICD-10-CM | POA: Diagnosis not present

## 2021-11-20 DIAGNOSIS — Z7985 Long-term (current) use of injectable non-insulin antidiabetic drugs: Secondary | ICD-10-CM | POA: Diagnosis not present

## 2021-11-20 DIAGNOSIS — N179 Acute kidney failure, unspecified: Secondary | ICD-10-CM | POA: Diagnosis present

## 2021-11-20 DIAGNOSIS — I10 Essential (primary) hypertension: Secondary | ICD-10-CM | POA: Diagnosis not present

## 2021-11-20 DIAGNOSIS — Z794 Long term (current) use of insulin: Secondary | ICD-10-CM

## 2021-11-20 DIAGNOSIS — R1012 Left upper quadrant pain: Principal | ICD-10-CM

## 2021-11-20 DIAGNOSIS — Z6841 Body Mass Index (BMI) 40.0 and over, adult: Secondary | ICD-10-CM | POA: Diagnosis not present

## 2021-11-20 DIAGNOSIS — M109 Gout, unspecified: Secondary | ICD-10-CM | POA: Diagnosis present

## 2021-11-20 DIAGNOSIS — I129 Hypertensive chronic kidney disease with stage 1 through stage 4 chronic kidney disease, or unspecified chronic kidney disease: Secondary | ICD-10-CM | POA: Diagnosis present

## 2021-11-20 DIAGNOSIS — Z7984 Long term (current) use of oral hypoglycemic drugs: Secondary | ICD-10-CM

## 2021-11-20 DIAGNOSIS — E119 Type 2 diabetes mellitus without complications: Secondary | ICD-10-CM | POA: Diagnosis not present

## 2021-11-20 DIAGNOSIS — K8010 Calculus of gallbladder with chronic cholecystitis without obstruction: Secondary | ICD-10-CM | POA: Diagnosis not present

## 2021-11-20 DIAGNOSIS — Z7982 Long term (current) use of aspirin: Secondary | ICD-10-CM

## 2021-11-20 DIAGNOSIS — E1165 Type 2 diabetes mellitus with hyperglycemia: Secondary | ICD-10-CM | POA: Diagnosis present

## 2021-11-20 DIAGNOSIS — R1011 Right upper quadrant pain: Secondary | ICD-10-CM | POA: Diagnosis not present

## 2021-11-20 DIAGNOSIS — E1142 Type 2 diabetes mellitus with diabetic polyneuropathy: Secondary | ICD-10-CM | POA: Diagnosis not present

## 2021-11-20 DIAGNOSIS — R748 Abnormal levels of other serum enzymes: Secondary | ICD-10-CM

## 2021-11-20 DIAGNOSIS — G4733 Obstructive sleep apnea (adult) (pediatric): Secondary | ICD-10-CM | POA: Diagnosis present

## 2021-11-20 DIAGNOSIS — K8020 Calculus of gallbladder without cholecystitis without obstruction: Principal | ICD-10-CM | POA: Diagnosis present

## 2021-11-20 HISTORY — DX: Essential (primary) hypertension: I10

## 2021-11-20 LAB — URINALYSIS, ROUTINE W REFLEX MICROSCOPIC
Bilirubin Urine: NEGATIVE
Glucose, UA: >=500 mg/dL — AB
Hgb urine dipstick: NEGATIVE
Ketones, ur: NEGATIVE mg/dL
Leukocytes,Ua: NEGATIVE
Nitrite: NEGATIVE
Protein, ur: 100 mg/dL — AB
Specific Gravity, Urine: 1.012 (ref 1.005–1.030)
pH: 5 (ref 5.0–8.0)

## 2021-11-20 LAB — CBC WITH DIFFERENTIAL/PLATELET
Abs Immature Granulocytes: 0.03 10*3/uL (ref 0.00–0.07)
Basophils Absolute: 0.1 10*3/uL (ref 0.0–0.1)
Basophils Relative: 1 %
Eosinophils Absolute: 0.6 10*3/uL — ABNORMAL HIGH (ref 0.0–0.5)
Eosinophils Relative: 6 %
HCT: 39.7 % (ref 39.0–52.0)
Hemoglobin: 14 g/dL (ref 13.0–17.0)
Immature Granulocytes: 0 %
Lymphocytes Relative: 35 %
Lymphs Abs: 3.1 10*3/uL (ref 0.7–4.0)
MCH: 31.7 pg (ref 26.0–34.0)
MCHC: 35.3 g/dL (ref 30.0–36.0)
MCV: 89.8 fL (ref 80.0–100.0)
Monocytes Absolute: 1 10*3/uL (ref 0.1–1.0)
Monocytes Relative: 11 %
Neutro Abs: 4.3 10*3/uL (ref 1.7–7.7)
Neutrophils Relative %: 47 %
Platelets: 277 10*3/uL (ref 150–400)
RBC: 4.42 MIL/uL (ref 4.22–5.81)
RDW: 13.4 % (ref 11.5–15.5)
WBC: 9 10*3/uL (ref 4.0–10.5)
nRBC: 0 % (ref 0.0–0.2)

## 2021-11-20 LAB — COMPREHENSIVE METABOLIC PANEL
ALT: 20 U/L (ref 0–44)
AST: 26 U/L (ref 15–41)
Albumin: 3.2 g/dL — ABNORMAL LOW (ref 3.5–5.0)
Alkaline Phosphatase: 60 U/L (ref 38–126)
Anion gap: 7 (ref 5–15)
BUN: 48 mg/dL — ABNORMAL HIGH (ref 6–20)
CO2: 21 mmol/L — ABNORMAL LOW (ref 22–32)
Calcium: 8.3 mg/dL — ABNORMAL LOW (ref 8.9–10.3)
Chloride: 106 mmol/L (ref 98–111)
Creatinine, Ser: 2.68 mg/dL — ABNORMAL HIGH (ref 0.61–1.24)
GFR, Estimated: 29 mL/min — ABNORMAL LOW (ref >60)
Glucose, Bld: 136 mg/dL — ABNORMAL HIGH (ref 70–99)
Potassium: 3.9 mmol/L (ref 3.5–5.1)
Sodium: 134 mmol/L — ABNORMAL LOW (ref 135–145)
Total Bilirubin: 0.2 mg/dL — ABNORMAL LOW (ref 0.3–1.2)
Total Protein: 6.7 g/dL (ref 6.5–8.1)

## 2021-11-20 LAB — PROTIME-INR
INR: 1 (ref 0.8–1.2)
Prothrombin Time: 12.8 seconds (ref 11.4–15.2)

## 2021-11-20 LAB — LIPASE, BLOOD: Lipase: 119 U/L — ABNORMAL HIGH (ref 11–51)

## 2021-11-20 LAB — GLUCOSE, CAPILLARY: Glucose-Capillary: 152 mg/dL — ABNORMAL HIGH (ref 70–99)

## 2021-11-20 LAB — HEMOGLOBIN A1C
Hgb A1c MFr Bld: 10.5 % — ABNORMAL HIGH (ref 4.8–5.6)
Mean Plasma Glucose: 254.65 mg/dL

## 2021-11-20 LAB — CBG MONITORING, ED: Glucose-Capillary: 142 mg/dL — ABNORMAL HIGH (ref 70–99)

## 2021-11-20 MED ORDER — PREGABALIN 75 MG PO CAPS: 75.0000 mg | ORAL_CAPSULE | Freq: Two times a day (BID) | ORAL | Status: DC

## 2021-11-20 MED ORDER — INSULIN ASPART 100 UNIT/ML IJ SOLN
0.0000 [IU] | Freq: Every day | INTRAMUSCULAR | Status: DC
  Filled 2021-11-20: qty 0.05

## 2021-11-20 MED ORDER — SODIUM CHLORIDE 0.9 % IV BOLUS
1000.0000 mL | Freq: Once | INTRAVENOUS | Status: AC
  Administered 2021-11-20: 1000 mL via INTRAVENOUS

## 2021-11-20 MED ORDER — INFLUENZA VAC SPLIT QUAD 0.5 ML IM SUSY: 0.5000 mL | PREFILLED_SYRINGE | INTRAMUSCULAR | Status: DC

## 2021-11-20 MED ORDER — ACETAMINOPHEN 650 MG RE SUPP: 650.0000 mg | Freq: Four times a day (QID) | RECTAL | Status: DC | PRN

## 2021-11-20 MED ORDER — ENOXAPARIN SODIUM 80 MG/0.8ML IJ SOSY
80.0000 mg | PREFILLED_SYRINGE | INTRAMUSCULAR | Status: DC
  Filled 2021-11-20: qty 0.8

## 2021-11-20 MED ORDER — HYDRALAZINE HCL 20 MG/ML IJ SOLN: 5.0000 mg | Freq: Four times a day (QID) | INTRAMUSCULAR | Status: DC | PRN

## 2021-11-20 MED ORDER — DEXTROSE-NACL 5-0.9 % IV SOLN: INTRAVENOUS | Status: DC

## 2021-11-20 MED ORDER — MORPHINE SULFATE (PF) 2 MG/ML IV SOLN
2.0000 mg | INTRAVENOUS | Status: DC | PRN
  Administered 2021-11-20 – 2021-11-22 (×6): 2 mg via INTRAVENOUS
  Filled 2021-11-20 (×6): qty 1

## 2021-11-20 MED ORDER — ONDANSETRON HCL 4 MG/2ML IJ SOLN
4.0000 mg | Freq: Once | INTRAMUSCULAR | Status: AC
  Administered 2021-11-20: 4 mg via INTRAVENOUS
  Filled 2021-11-20: qty 2

## 2021-11-20 MED ORDER — ACETAMINOPHEN 325 MG PO TABS: 650.0000 mg | ORAL_TABLET | Freq: Four times a day (QID) | ORAL | Status: DC | PRN

## 2021-11-20 MED ORDER — FENTANYL CITRATE PF 50 MCG/ML IJ SOSY
50.0000 ug | PREFILLED_SYRINGE | Freq: Once | INTRAMUSCULAR | Status: AC
  Administered 2021-11-20: 50 ug via INTRAVENOUS
  Filled 2021-11-20: qty 1

## 2021-11-20 MED ORDER — INSULIN ASPART 100 UNIT/ML IJ SOLN
0.0000 [IU] | Freq: Three times a day (TID) | INTRAMUSCULAR | Status: DC
  Administered 2021-11-21: 8 [IU] via SUBCUTANEOUS
  Filled 2021-11-20: qty 0.15

## 2021-11-20 MED ORDER — HYDROCODONE-ACETAMINOPHEN 5-325 MG PO TABS: 1.0000 | ORAL_TABLET | ORAL | Status: DC | PRN

## 2021-11-20 NOTE — ED Notes (Signed)
ED TO INPATIENT HANDOFF REPORT  ED Nurse Name and Phone #: Charlean Sanfilippo RN  S Name/Age/Gender Barry [name] 46 y.o. male Room/Bed: WA13/WA13  Code Status   Code Status: Prior  Home/SNF/Other Home Patient oriented to: self, place, time, and situation Is this baseline? Yes   Triage Complete: Triage complete  Chief Complaint Acute pancreatitis [K85.90]  Triage Note Pt reports with right abdominal pain x 3 weeks. Pt states that e has gall stones but cant have surgery. Pt also states that he had an episode of blurred vision and had a glucose drop today.    Allergies No Known Allergies  Level of Care/Admitting Diagnosis ED Disposition     ED Disposition  Admit   Condition  --   Comment  Hospital Area: [facility] [[id number]]  Level of Care: Med-Surg [16]  May place patient in observation at [facility] or [facility] if equivalent level of care is available:: Yes  Covid Evaluation: Asymptomatic - no recent exposure (last 10 days) testing not required  Diagnosis: Acute pancreatitis [577.0.ICD-9-CM]  Admitting Physician: Hosie Poisson [[id number]]  Attending Physician: Hosie Poisson [[id number]]          B Medical/Surgery History Past Medical History:  Diagnosis Date   Carpal tunnel syndrome    Diabetes mellitus without complication ([place])    Gout    Hypercholesteremia    Hypothyroidism    Neuropathy    Obesity    Thyroid disease    Past Surgical History:  Procedure Laterality Date   AMPUTATION Left 06/10/2021   Procedure: 5th RAY AMPUTATION LEFT FOOT;  Surgeon: Edrick Kins, DPM;  Location: [facility];  Service: Podiatry;  Laterality: Left;   FRACTURE SURGERY     INCISE AND DRAIN ABCESS     IRRIGATION AND DEBRIDEMENT FOOT Right 04/27/2019   Procedure: Debridement and Irrigation with possible placement of antibiotic beads;  Surgeon: Evelina Bucy, DPM;  Location: [facility];  Service: Podiatry;  Laterality: Right;   ORIF TOE FRACTURE Right 04/27/2019   Procedure:  Open Reduction vs Excision of Fracture Fragment Right great toe, with pinning.;  Surgeon: Evelina Bucy, DPM;  Location[facility];  Service: Podiatry;  Laterality: Right;   TONSILLECTOMY       A IV Location/Drains/Wounds Patient Lines/Drains/Airways Status     Active Line/Drains/Airways     Name Placement date Placement time Site Days   Peripheral IV 11/20/21 20 G Right Forearm 11/20/21  0945  Forearm  less than 1   Incision (Closed) 04/27/19 Foot Right 04/27/19  1119  -- 938   Incision (Closed) 06/10/21 Foot Left 06/10/21  1848  -- 163   Wound / Incision (Open or Dehisced) 06/09/21 Diabetic ulcer Foot Anterior;Left;Distal 06/09/21  1220  Foot  164            Intake/Output Last 24 hours No intake or output data in the 24 hours ending 11/20/21 1835  Labs/Imaging Results for orders placed or performed during the hospital encounter of 11/20/21 (from the past 48 hour(s))  CBG monitoring, ED     Status: Abnormal   Collection Time: 11/20/21  5:37 AM  Result Value Ref Range   Glucose-Capillary 142 (H) 70 - 99 mg/dL    Comment: Glucose reference range applies only to samples taken after fasting for at least 8 hours.  Comprehensive metabolic panel     Status: Abnormal   Collection Time: 11/20/21  5:45 AM  Result Value Ref Range   Sodium 134 (L) 135 -  145 mmol/L   Potassium 3.9 3.5 - 5.1 mmol/L   Chloride 106 98 - 111 mmol/L   CO2 21 (L) 22 - 32 mmol/L   Glucose, Bld 136 (H) 70 - 99 mg/dL    Comment: Glucose reference range applies only to samples taken after fasting for at least 8 hours.   BUN 48 (H) 6 - 20 mg/dL   Creatinine, Ser 2.68 (H) 0.61 - 1.24 mg/dL   Calcium 8.3 (L) 8.9 - 10.3 mg/dL   Total Protein 6.7 6.5 - 8.1 g/dL   Albumin 3.2 (L) 3.5 - 5.0 g/dL   AST 26 15 - 41 U/L   ALT 20 0 - 44 U/L   Alkaline Phosphatase 60 38 - 126 U/L   Total Bilirubin 0.2 (L) 0.3 - 1.2 mg/dL   GFR, Estimated 29 (L) >60 mL/min    Comment: (NOTE) Calculated using the CKD-EPI Creatinine  Equation (2021)    Anion gap 7 5 - 15    Comment: Performed at [facility], [place] [address] [place], [place] [address]  Lipase, blood     Status: Abnormal   Collection Time: 11/20/21  5:45 AM  Result Value Ref Range   Lipase 119 (H) 11 - 51 U/L    Comment: Performed at [facility], [place] [address] [place], [place] [address]  CBC with Diff     Status: Abnormal   Collection Time: 11/20/21  5:45 AM  Result Value Ref Range   WBC 9.0 4.0 - 10.5 K/uL   RBC 4.42 4.22 - 5.81 MIL/uL   Hemoglobin 14.0 13.0 - 17.0 g/dL   HCT 39.7 39.0 - 52.0 %   MCV 89.8 80.0 - 100.0 fL   MCH 31.7 26.0 - 34.0 pg   MCHC 35.3 30.0 - 36.0 g/dL   RDW 13.4 11.5 - 15.5 %   Platelets 277 150 - 400 K/uL   nRBC 0.0 0.0 - 0.2 %   Neutrophils Relative % 47 %   Neutro Abs 4.3 1.7 - 7.7 K/uL   Lymphocytes Relative 35 %   Lymphs Abs 3.1 0.7 - 4.0 K/uL   Monocytes Relative 11 %   Monocytes Absolute 1.0 0.1 - 1.0 K/uL   Eosinophils Relative 6 %   Eosinophils Absolute 0.6 (H) 0.0 - 0.5 K/uL   Basophils Relative 1 %   Basophils Absolute 0.1 0.0 - 0.1 K/uL   Immature Granulocytes 0 %   Abs Immature Granulocytes 0.03 0.00 - 0.07 K/uL    Comment: Performed at [facility], [place] [address] [place], [place] [address]  Urinalysis, Routine w reflex microscopic     Status: Abnormal   Collection Time: 11/20/21  5:45 AM  Result Value Ref Range   Color, Urine STRAW (A) YELLOW   APPearance CLEAR CLEAR   Specific Gravity, Urine 1.012 1.005 - 1.030   pH 5.0 5.0 - 8.0   Glucose, UA >=500 (A) NEGATIVE mg/dL   Hgb urine dipstick NEGATIVE NEGATIVE   Bilirubin Urine NEGATIVE NEGATIVE   Ketones, ur NEGATIVE NEGATIVE mg/dL   Protein, ur 100 (A) NEGATIVE mg/dL   Nitrite NEGATIVE NEGATIVE   Leukocytes,Ua NEGATIVE NEGATIVE   RBC / HPF 0-5 0 - 5 RBC/hpf   WBC, UA 0-5 0 - 5 WBC/hpf   Bacteria, UA RARE (A) NONE SEEN   Squamous Epithelial / LPF 0-5 0 - 5    Comment: Performed at  [facility], [place] [address] [place], [place] [address]  Protime-INR     Status: None   Collection Time: 11/20/21 12:18 PM  Result Value Ref Range   Prothrombin Time 12.8 11.4 - 15.2 seconds   INR 1.0 0.8 - 1.2    Comment: (NOTE) INR goal varies based on device and disease states. Performed at [facility], [place] [address], [place], [place] [address]    CT Renal Stone Study  Result Date: 11/20/2021 CLINICAL DATA:  Right abdominal pain for 3 weeks. Cholelithiasis. Possible right renal calculi. EXAM: CT ABDOMEN AND PELVIS WITHOUT CONTRAST TECHNIQUE: Multidetector CT imaging of the abdomen and pelvis was performed following the standard protocol without IV contrast. RADIATION DOSE REDUCTION: This exam was performed according to the departmental dose-optimization program which includes automated exposure control, adjustment of the mA and/or kV according to patient size and/or use of iterative reconstruction technique. COMPARISON:  06/22/2021 FINDINGS: Lower chest: Chronic rounded atelectasis in the right lower lobe on image 1 series 2 as shown on prior exams including 11/04/2018. Adjacent pleural thickening or peripheral atelectasis. Hepatobiliary: Numerous gallstones identified measuring up to about 1.1 cm in long axis. These are of variable size. No definite stones identified in the common bile duct. No compelling evidence of intrahepatic biliary dilatation or focal hepatic lesion on today's noncontrast exam. Pancreas: Unremarkable Spleen: Unremarkable Adrenals/Urinary Tract: Both adrenal glands appear normal. Symmetric mild bilateral perirenal stranding appears to be chronic. No urinary tract calculi. No hydronephrosis or hydroureter. Urinary bladder unremarkable. Stomach/Bowel: Unremarkable, appendix appears unchanged without inflammatory findings. Vascular/Lymphatic: 1.2 cm left periaortic lymph node on image 45 series 2, formerly the same. 1.1 cm right common  iliac lymph node on image 58 series 2, formerly 1.2 cm. Additional borderline prominent retroperitoneal, porta hepatis, and iliac lymph nodes similar to previous. Reproductive: Unremarkable Other: No supplemental non-categorized findings. Musculoskeletal: Short pedicles, facet arthropathy, and mild disc bulges lead to possible mild bilateral foraminal impingement at the L4-5 and L5-S1 levels. IMPRESSION: 1. A specific cause for the patient's right abdominal pain is not identified. 2. Cholelithiasis. 3. Chronic rounded atelectasis in the right lower lobe with adjacent pleural thickening or peripheral atelectasis. 4. Borderline prominent retroperitoneal, porta hepatis, and iliac lymph nodes, similar to prior. 5. Lower lumbar impingement at L4-5 and L5-S1. Electronically Signed   By: Van Clines M.D.   On: 11/20/2021 16:56   US Abdomen Limited RUQ (LIVER/GB)  Result Date: 11/20/2021 CLINICAL DATA:  RIGHT upper quadrant abdominal pain EXAM: ULTRASOUND ABDOMEN LIMITED RIGHT UPPER QUADRANT COMPARISON:  05/12/2020 FINDINGS: Gallbladder: Multiple shadowing calculi within gallbladder up to 8 mm diameter. No gallbladder wall thickening, pericholecystic fluid, or sonographic Murphy sign. Small amount of associated sludge in gallbladder. Common bile duct: Diameter: 3 mm Liver: Slightly echogenic parenchyma, likely fatty infiltration though this can be seen with cirrhosis and certain infiltrative disorders. No definite hepatic mass or nodularity. Portal vein is patent on color Doppler imaging with normal direction of blood flow towards the liver. Other: No RIGHT upper quadrant free fluid. IMPRESSION: Cholelithiasis without evidence of acute cholecystitis or biliary dilatation. Probable fatty infiltration of liver as above. Electronically Signed   By: Lavonia Dana M.D.   On: 11/20/2021 09:55    Pending Labs Unresulted Labs (From admission, onward)     Start     Ordered   11/20/21 1813  Hemoglobin A1c  Add-on,   AD        Comments: To assess prior glycemic control    11/20/21 1813   Signed and Held  Creatinine, serum  (  enoxaparin (LOVENOX)    CrCl < 30 ml/min)  Once,   R       Comments: while on enoxaparin therapy.    Signed and Held            Vitals/Pain Today's Vitals   11/20/21 1519 11/20/21 1530 11/20/21 1630 11/20/21 1709  BP: (!) 144/102 (!) 149/99 (!) 150/96   Pulse: 78 77 78   Resp: 18 18 18    Temp:    (!) 97.5 F (36.4 C)  TempSrc:    Oral  SpO2: 98% 98% 95%   Weight:      Height:      PainSc:        Isolation Precautions No active isolations  Medications Medications  influenza vac split quadrivalent PF (FLUARIX) injection 0.5 mL (has no administration in time range)  insulin aspart (novoLOG) injection 0-15 Units (has no administration in time range)  insulin aspart (novoLOG) injection 0-5 Units (has no administration in time range)  fentaNYL (SUBLIMAZE) injection 50 mcg (50 mcg Intravenous Given 11/20/21 0949)  ondansetron (ZOFRAN) injection 4 mg (4 mg Intravenous Given 11/20/21 0950)  sodium chloride 0.9 % bolus 1,000 mL (0 mLs Intravenous Stopped 11/20/21 1052)  sodium chloride 0.9 % bolus 1,000 mL (1,000 mLs Intravenous New Bag/Given 11/20/21 1653)    Mobility walks Low fall risk   Focused Assessments     R Recommendations: See Admitting Provider Note  Report given to:   Additional Notes:

## 2021-11-20 NOTE — ED Triage Notes (Signed)
Pt reports with right abdominal pain x 3 weeks. Pt states that e has gall stones but cant have surgery. Pt also states that he had an episode of blurred vision and had a glucose drop today.

## 2021-11-20 NOTE — Consult Note (Addendum)
Consult Note  Clearance Chenault September 10, 1975  [id number].    Requesting MD: Dr. Sherry Ruffing Chief Complaint/Reason for Consult: abdominal pain  HPI:  46 y.o. 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 Pain is located in the right upper quadrant and epigastric region.  It is unchanged by PO intake. This pain episode started 3 weeks ago and has been worsening prompting his presentation to ED today. He does have a history of previous cholecystitis documented in problem list in care everywhere but do not see further documentation in epic regarding this. The patient tells me, some time ago in [place], surgeons attempted cholecystectomy due to worsening biliary colic and then "signs of inflammation". He tells me that they were not successful because his liver was too large/in the way.  He denies nausea or emesis.  He has been eating normally. He denies constipation, diarrhea, fever, chills.   Work-up in ED significant for ultrasound showing cholelithiasis without evidence of acute cholecystitis or biliary dilatation.  LFTs WNL except for T. bili which is slightly down at 0.2.  Lipase 119.  General surgery was asked to see in regard to symptomatic cholelithiasis  Substance use: patient denies tobacco or drug use, reports occasional (once every 2 weeks) alcohol use Allergies: None Blood thinners: 81 mg ASA Past Surgeries: patient reports aborted lap chole. Also reports a history of left chest wall debridement for a skin infection.  ROS: ROS negative except as above   History reviewed. No pertinent family history.  Past Medical History:  Diagnosis Date   Carpal tunnel syndrome    Diabetes mellitus without complication ([place])    Gout    Hypercholesteremia    Hypothyroidism     Neuropathy    Obesity    Thyroid disease     Past Surgical History:  Procedure Laterality Date   AMPUTATION Left 06/10/2021   Procedure: 5th RAY AMPUTATION LEFT FOOT;  Surgeon: Edrick Kins, DPM;  [facility];  Service: Podiatry;  Laterality: Left;   FRACTURE SURGERY     INCISE AND DRAIN ABCESS     IRRIGATION AND DEBRIDEMENT FOOT Right 04/27/2019   Procedure: Debridement and Irrigation with possible placement of antibiotic beads;  Surgeon: Evelina Bucy, DPM;  Location: [facility];  Service: Podiatry;  Laterality: Right;   ORIF TOE FRACTURE Right 04/27/2019   Procedure: Open Reduction vs Excision of Fracture Fragment Right great toe, with pinning.;  Surgeon: Evelina Bucy, DPM;  [facility];  Service: Podiatry;  Laterality: Right;   TONSILLECTOMY      Social History:  reports that he has never smoked. He has never used smokeless tobacco. He reports current alcohol use. He reports that he does not use drugs.  Allergies: No Known Allergies  (Not in a hospital admission)   Blood pressure 127/87, pulse 64, temperature (!) 97.3 F (36.3 C), temperature source Oral, resp. rate 18, height 6\' 3"  (1.905 m), weight (!) 158.8 kg, SpO2 98 %. Physical Exam: General: pleasant, WD, male who is laying in bed in NAD HEENT: head is normocephalic, atraumatic.  Sclera are noninjected.  Pupils equal and round. EOMs intact.  Anicteric sclerae Heart: regular, rate, and rhythm.  Normal s1,s2. No obvious murmurs, gallops, or rubs noted.  Lungs: CTAB,  Respiratory effort non-labored ORA Abd: soft, mild RUQ tenderness without guarding or  rebound tenderness, ND, no masses, hernias, or organomegaly; previous RUQ port sites and umbilical site appears well-healing, scar noted over R lower chest wall as well. MSK: all 4 extremities are symmetrical with no cyanosis, clubbing, or edema. Skin: warm and dry with no masses, lesions, or rashes Neuro: non-focal exam. Gait not assesed Psych: A&Ox3 with an  appropriate affect.    Results for orders placed or performed during the hospital encounter of 11/20/21 (from the past 48 hour(s))  CBG monitoring, ED     Status: Abnormal   Collection Time: 11/20/21  5:37 AM  Result Value Ref Range   Glucose-Capillary 142 (H) 70 - 99 mg/dL    Comment: Glucose reference range applies only to samples taken after fasting for at least 8 hours.  Comprehensive metabolic panel     Status: Abnormal   Collection Time: 11/20/21  5:45 AM  Result Value Ref Range   Sodium 134 (L) 135 - 145 mmol/L   Potassium 3.9 3.5 - 5.1 mmol/L   Chloride 106 98 - 111 mmol/L   CO2 21 (L) 22 - 32 mmol/L   Glucose, Bld 136 (H) 70 - 99 mg/dL    Comment: Glucose reference range applies only to samples taken after fasting for at least 8 hours.   BUN 48 (H) 6 - 20 mg/dL   Creatinine, Ser 4.97 (H) 0.61 - 1.24 mg/dL   Calcium 8.3 (L) 8.9 - 10.3 mg/dL   Total Protein 6.7 6.5 - 8.1 g/dL   Albumin 3.2 (L) 3.5 - 5.0 g/dL   AST 26 15 - 41 U/L   ALT 20 0 - 44 U/L   Alkaline Phosphatase 60 38 - 126 U/L   Total Bilirubin 0.2 (L) 0.3 - 1.2 mg/dL   GFR, Estimated 29 (L) >60 mL/min    Comment: (NOTE) Calculated using the CKD-EPI Creatinine Equation (2021)    Anion gap 7 5 - 15    Comment: Performed at [facility], [address], [place], [place] [address]  Lipase, blood     Status: Abnormal   Collection Time: 11/20/21  5:45 AM  Result Value Ref Range   Lipase 119 (H) 11 - 51 U/L    Comment: Performed at [facility], [address], [place], [place] [address]  CBC with Diff     Status: Abnormal   Collection Time: 11/20/21  5:45 AM  Result Value Ref Range   WBC 9.0 4.0 - 10.5 K/uL   RBC 4.42 4.22 - 5.81 MIL/uL   Hemoglobin 14.0 13.0 - 17.0 g/dL   HCT 02.7 74.1 - 28.7 %   MCV 89.8 80.0 - 100.0 fL   MCH 31.7 26.0 - 34.0 pg   MCHC 35.3 30.0 - 36.0 g/dL   RDW 86.7 67.2 - 09.4 %   Platelets 277 150 - 400 K/uL   nRBC 0.0 0.0 - 0.2 %   Neutrophils  Relative % 47 %   Neutro Abs 4.3 1.7 - 7.7 K/uL   Lymphocytes Relative 35 %   Lymphs Abs 3.1 0.7 - 4.0 K/uL   Monocytes Relative 11 %   Monocytes Absolute 1.0 0.1 - 1.0 K/uL   Eosinophils Relative 6 %   Eosinophils Absolute 0.6 (H) 0.0 - 0.5 K/uL   Basophils Relative 1 %   Basophils Absolute 0.1 0.0 - 0.1 K/uL   Immature Granulocytes 0 %   Abs Immature Granulocytes 0.03 0.00 - 0.07 K/uL    Comment: Performed at [facility],  [address] [place], [place] [address]  Urinalysis, Routine w reflex microscopic     Status: Abnormal   Collection Time: 11/20/21  5:45 AM  Result Value Ref Range   Color, Urine STRAW (A) YELLOW   APPearance CLEAR CLEAR   Specific Gravity, Urine 1.012 1.005 - 1.030   pH 5.0 5.0 - 8.0   Glucose, UA >=500 (A) NEGATIVE mg/dL   Hgb urine dipstick NEGATIVE NEGATIVE   Bilirubin Urine NEGATIVE NEGATIVE   Ketones, ur NEGATIVE NEGATIVE mg/dL   Protein, ur 381 (A) NEGATIVE mg/dL   Nitrite NEGATIVE NEGATIVE   Leukocytes,Ua NEGATIVE NEGATIVE   RBC / HPF 0-5 0 - 5 RBC/hpf   WBC, UA 0-5 0 - 5 WBC/hpf   Bacteria, UA RARE (A) NONE SEEN   Squamous Epithelial / LPF 0-5 0 - 5    Comment: Performed at [facility], [address] [place], [place] [address]  Protime-INR     Status: None   Collection Time: 11/20/21 12:18 PM  Result Value Ref Range   Prothrombin Time 12.8 11.4 - 15.2 seconds   INR 1.0 0.8 - 1.2    Comment: (NOTE) INR goal varies based on device and disease states. Performed at [facility], [address], [place], [place] [address]    US Abdomen Limited RUQ (LIVER/GB)  Result Date: 11/20/2021 CLINICAL DATA:  RIGHT upper quadrant abdominal pain EXAM: ULTRASOUND ABDOMEN LIMITED RIGHT UPPER QUADRANT COMPARISON:  05/12/2020 FINDINGS: Gallbladder: Multiple shadowing calculi within gallbladder up to 8 mm diameter. No gallbladder wall thickening, pericholecystic fluid, or sonographic Murphy sign. Small  amount of associated sludge in gallbladder. Common bile duct: Diameter: 3 mm Liver: Slightly echogenic parenchyma, likely fatty infiltration though this can be seen with cirrhosis and certain infiltrative disorders. No definite hepatic mass or nodularity. Portal vein is patent on color Doppler imaging with normal direction of blood flow towards the liver. Other: No RIGHT upper quadrant free fluid. IMPRESSION: Cholelithiasis without evidence of acute cholecystitis or biliary dilatation. Probable fatty infiltration of liver as above. Electronically Signed   By: Ulyses Southward M.D.   On: 11/20/2021 09:55      Assessment/Plan Symptomatic cholelithiasis Patient seen and examined and relevant labs and imaging reviewed.  His history is not entirely consistent with symptomatic cholelithiasis - eating and drinking without post-prandial pain, nausea, or vomiting. With his previous history of stones, biliary colic, and cholecystitis, as well as the location of his pain in the RUQ, this episode of pain could be due to gallstones. Do not think he has cholecystitis.  Definitive management of symptomatic cholelithiasis is surgical.  Will check non-con CT to r/o kidney disease or other possible intra-abdominal pathology given atypical presentation. Given his body habitus and poorly controlled diabetes he is at higher risk for operative complications.  Will discuss with my attending but, due to ongoing symptomatic gallstones, in absence of other explanation for his pain, I think consideration of re-attempt at lap chole is reasonable.  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    Hosie Spangle, [facility]  Surgery 11/20/2021, 2:07 PM Please see Amion for pager number during day hours 7:00am-4:30pm

## 2021-11-20 NOTE — H&P (Signed)
History and Physical    Patient: Barry [name] DOB: 04-09-1975 DOA: 11/20/2021 DOS: the patient was seen and examined on 11/20/2021 PCP: Charlott Rakes, MD  Patient coming from: Home  Chief Complaint:  Chief Complaint  Patient presents with   Abdominal Pain   HPI: Barry [name] is a 46 y.o. male with medical history significant of DM insulin dependent, Gout, hyperlipidemia,  CKD, OSA, obesity, hypothyroidism, reports upper abdominal pain since 3 weeks. He was found to have cholelithiasis. He reports seeing a Careers adviser in [place] and an attempt to remove the GB , but unable to do so due to enlarged liver. He presents to today with nausea, persistent abdominal pain, no vomiting, diarrhea, fever or chills, cough, . He denies any syncope, urinary symptoms, hematemesis or hematochezia.  Labs reveal AKI, with creatinine of 2.68, BUN of 28, bicarb of 21, sodium of 134, . UA is negative for infection.  He was afebrile and normotensive.  CT renal study was done , showing cholelithiasis.  US abdomen does not show any acute cholecystitis. General surgery consulted and he was referred to [facility] for admission.    Review of Systems: As mentioned in the history of present illness. All other systems reviewed and are negative. Past Medical History:  Diagnosis Date   Carpal tunnel syndrome    Diabetes mellitus without complication (HCC)    Gout    Hypercholesteremia    Hypothyroidism    Neuropathy    Obesity    Thyroid disease    Past Surgical History:  Procedure Laterality Date   AMPUTATION Left 06/10/2021   Procedure: 5th RAY AMPUTATION LEFT FOOT;  Surgeon: [name], DPM;  Location: [facility];  Service: Podiatry;  Laterality: Left;   FRACTURE SURGERY     INCISE AND DRAIN ABCESS     IRRIGATION AND DEBRIDEMENT FOOT Right 04/27/2019   Procedure: Debridement and Irrigation with possible placement of antibiotic beads;  Surgeon: Park Liter, DPM;  Location: [facility];  Service:  Podiatry;  Laterality: Right;   ORIF TOE FRACTURE Right 04/27/2019   Procedure: Open Reduction vs Excision of Fracture Fragment Right great toe, with pinning.;  Surgeon: Park Liter, DPM;  Location: [facility];  Service: Podiatry;  Laterality: Right;   TONSILLECTOMY     Social History:  reports that he has never smoked. He has never used smokeless tobacco. He reports current alcohol use. He reports that he does not use drugs.  No Known Allergies  History reviewed. No pertinent family history.  Prior to Admission medications   Medication Sig Start Date End Date Taking? Authorizing Provider  Accu-Chek Softclix Lancets lancets 1 each 3 (three) times daily. 02/01/21   [provider]  allopurinol (ZYLOPRIM) 300 MG tablet Take 300 mg by mouth daily. 10/22/19   [provider]  amLODipine (NORVASC) 10 MG tablet Take 1 tablet (10 mg total) by mouth daily. 06/28/21 07/28/21  Dimple Nanas, MD  amoxicillin-clavulanate (AUGMENTIN) 875-125 MG tablet Take 1 tablet by mouth every 12 (twelve) hours. 10/11/21   Sabas Sous, MD  aspirin 81 MG chewable tablet Chew 81 mg by mouth daily. 05/03/17   [provider]  atorvastatin (LIPITOR) 80 MG tablet Take 80 mg by mouth daily.  11/21/17   [provider]  Blood Glucose Monitoring Suppl (GLUCOCOM BLOOD GLUCOSE MONITOR) DEVI 1 each by Other route 3 (three) times daily. 01/30/21   [provider]  colchicine 0.6 MG tablet Take 1 tablet (0.6 mg total) by  mouth daily as needed. 06/27/21   Amin, Loura Halt, MD  fluticasone (FLONASE) 50 MCG/ACT nasal spray Place 2 sprays into both nostrils daily for 5 days. 10/11/21 10/16/21  Sabas Sous, MD  furosemide (LASIX) 20 MG tablet Take 20 mg by mouth daily. 01/17/20   [provider]  gentamicin ointment (GARAMYCIN) 0.1 % Apply 1 Application topically daily. For wound care 08/19/21   [name], DPM  HUMALOG MIX 75/25 (75-25) 100 UNIT/ML SUSP injection Inject 100 Units  into the skin 2 (two) times a day. 06/07/18   [provider]  HYDROcodone-acetaminophen (NORCO/VICODIN) 5-325 MG tablet Take 1 tablet by mouth every 4 (four) hours as needed. 10/11/21   Sabas Sous, MD  JARDIANCE 25 MG TABS tablet Take 25 mg by mouth daily. 10/18/18   [provider]  losartan (COZAAR) 25 MG tablet Take 1 tablet (25 mg total) by mouth 2 (two) times daily. 06/27/21 07/27/21  Amin, Loura Halt, MD  naloxone [facility]) nasal spray 4 mg/0.1 mL Place 1 spray into the nose once. 06/12/21   [provider]  oxyCODONE-acetaminophen (PERCOCET) 5-325 MG tablet Take 1 tablet by mouth every 8 (eight) hours as needed for severe pain. Patient not taking: Reported on 08/19/2021 07/29/21   [name], DPM  OZEMPIC, 0.25 OR 0.5 MG/DOSE, 2 MG/3ML SOPN Inject 0.5 mg as directed once a week. fridays 05/21/21   [provider]  pregabalin (LYRICA) 75 MG capsule Take 1 capsule (75 mg total) by mouth 2 (two) times daily. 08/23/19   Stover, Titorya, DPM  RELION INSULIN SYRINGE 1ML/31G 31G X 5/16" 1 ML MISC Inject 1 Syringe into the skin 2 (two) times daily. 01/26/21   [provider]  silver sulfADIAZINE (SILVADENE) 1 % cream Apply pea-sized amount to wound daily. 02/14/21   Park Liter, DPM    Physical Exam: Vitals:   11/20/21 1519 11/20/21 1530 11/20/21 1630 11/20/21 1709  BP: (!) 144/102 (!) 149/99 (!) 150/96   Pulse: 78 77 78   Resp: 18 18 18    Temp:    (!) 97.5 F (36.4 C)  TempSrc:    Oral  SpO2: 98% 98% 95%   Weight:      Height:      General exam: Appears calm and comfortable  Respiratory system: Clear to auscultation. Respiratory effort normal. Cardiovascular system: S1 & S2 heard, RRR. No JVD,  No pedal edema. Gastrointestinal system: Abdomen is soft, generalized tenderness, .Normal bowel sounds heard. Central nervous system: Alert and oriented. No focal neurological deficits. Extremities: Symmetric 5 x 5 power. Skin: No rashes, lesions or  ulcers Psychiatry: Mood & affect appropriate.   Data Reviewed: Results for orders placed or performed during the hospital encounter of 11/20/21 (from the past 24 hour(s))  CBG monitoring, ED     Status: Abnormal   Collection Time: 11/20/21  5:37 AM  Result Value Ref Range   Glucose-Capillary 142 (H) 70 - 99 mg/dL  Comprehensive metabolic panel     Status: Abnormal   Collection Time: 11/20/21  5:45 AM  Result Value Ref Range   Sodium 134 (L) 135 - 145 mmol/L   Potassium 3.9 3.5 - 5.1 mmol/L   Chloride 106 98 - 111 mmol/L   CO2 21 (L) 22 - 32 mmol/L   Glucose, Bld 136 (H) 70 - 99 mg/dL   BUN 48 (H) 6 - 20 mg/dL   Creatinine, Ser 13/01/23 (H) 0.61 - 1.24 mg/dL   Calcium 8.3 (L)  8.9 - 10.3 mg/dL   Total Protein 6.7 6.5 - 8.1 g/dL   Albumin 3.2 (L) 3.5 - 5.0 g/dL   AST 26 15 - 41 U/L   ALT 20 0 - 44 U/L   Alkaline Phosphatase 60 38 - 126 U/L   Total Bilirubin 0.2 (L) 0.3 - 1.2 mg/dL   GFR, Estimated 29 (L) >60 mL/min   Anion gap 7 5 - 15  Lipase, blood     Status: Abnormal   Collection Time: 11/20/21  5:45 AM  Result Value Ref Range   Lipase 119 (H) 11 - 51 U/L  CBC with Diff     Status: Abnormal   Collection Time: 11/20/21  5:45 AM  Result Value Ref Range   WBC 9.0 4.0 - 10.5 K/uL   RBC 4.42 4.22 - 5.81 MIL/uL   Hemoglobin 14.0 13.0 - 17.0 g/dL   HCT 46.9 62.9 - 52.8 %   MCV 89.8 80.0 - 100.0 fL   MCH 31.7 26.0 - 34.0 pg   MCHC 35.3 30.0 - 36.0 g/dL   RDW 41.3 24.4 - 01.0 %   Platelets 277 150 - 400 K/uL   nRBC 0.0 0.0 - 0.2 %   Neutrophils Relative % 47 %   Neutro Abs 4.3 1.7 - 7.7 K/uL   Lymphocytes Relative 35 %   Lymphs Abs 3.1 0.7 - 4.0 K/uL   Monocytes Relative 11 %   Monocytes Absolute 1.0 0.1 - 1.0 K/uL   Eosinophils Relative 6 %   Eosinophils Absolute 0.6 (H) 0.0 - 0.5 K/uL   Basophils Relative 1 %   Basophils Absolute 0.1 0.0 - 0.1 K/uL   Immature Granulocytes 0 %   Abs Immature Granulocytes 0.03 0.00 - 0.07 K/uL  Urinalysis, Routine w reflex microscopic      Status: Abnormal   Collection Time: 11/20/21  5:45 AM  Result Value Ref Range   Color, Urine STRAW (A) YELLOW   APPearance CLEAR CLEAR   Specific Gravity, Urine 1.012 1.005 - 1.030   pH 5.0 5.0 - 8.0   Glucose, UA >=500 (A) NEGATIVE mg/dL   Hgb urine dipstick NEGATIVE NEGATIVE   Bilirubin Urine NEGATIVE NEGATIVE   Ketones, ur NEGATIVE NEGATIVE mg/dL   Protein, ur 272 (A) NEGATIVE mg/dL   Nitrite NEGATIVE NEGATIVE   Leukocytes,Ua NEGATIVE NEGATIVE   RBC / HPF 0-5 0 - 5 RBC/hpf   WBC, UA 0-5 0 - 5 WBC/hpf   Bacteria, UA RARE (A) NONE SEEN   Squamous Epithelial / LPF 0-5 0 - 5  Protime-INR     Status: None   Collection Time: 11/20/21 12:18 PM  Result Value Ref Range   Prothrombin Time 12.8 11.4 - 15.2 seconds   INR 1.0 0.8 - 1.2     Assessment and Plan:  Symptomatic Gall stones without acute cholecystitis:  - pt reports constant abdominal pain since 3 weeks. Not associated with eating .  - gently hydrate .  - pt NPO as per general surgery recommendations for possible lap cholecystectomy.  - symptomatic management with pain control, IV fluids .     Acute mild pancreatitis:  Suspect gall stone pancreatitis.  May be he passed a stone.  No CBD dilatation on the CT or [place] abd the abdomen.  Pain control and IV fluids.     Hypertension:  BP parameters are optimal.  Restart home meds.     Type 2 DM insulin dependent.  Last A1C IS 10.7 earlier this year.  Continue  with SSI.  Need med reconciliation.     Obesity BMI Poor prognostic factor.   Acute on stge 3 a CKD - Base line creatinine between 1.6 to 1.9 He is admitted with a creatinine of 2.68.  Suspect from poor oral intake and nausea, and dehydration.  Start him gentle hydration.  Repeat renal parameters in am.  Stop cozaar and lasix till renal parameters improve.     Hyperlipidemia:  On statin at home, resume post surgery.     Peripheral neuropathy;  Resume lyrica.      Advance Care Planning:  full code.   Consults: general surgery  Family Communication: none at bedside.   Severity of Illness: The appropriate patient status for this patient is OBSERVATION. Observation status is judged to be reasonable and necessary in order to provide the required intensity of service to ensure the patient's safety. The patient's presenting symptoms, physical exam findings, and initial radiographic and laboratory data in the context of their medical condition is felt to place them at decreased risk for further clinical deterioration. Furthermore, it is anticipated that the patient will be medically stable for discharge from the hospital within 2 midnights of admission.   Author: Hosie Poisson, MD 11/20/2021 6:13 PM  For on call review [email or web address].

## 2021-11-20 NOTE — ED Provider Notes (Signed)
46 yo male w/ cholecystitis presenting with abdominal pain Lipase elevated today Labs show AKI Pending general surgery consultation   Physical Exam  BP (!) 149/99   Pulse 77   Temp (!) 97.3 F (36.3 C) (Oral)   Resp 18   Ht 6\' 3"  (1.905 m)   Wt (!) 158.8 kg   SpO2 98%   BMI 43.75 kg/m   Physical Exam  Procedures  Procedures  ED Course / MDM    Medical Decision Making Amount and/or Complexity of Data Reviewed Labs: ordered. Radiology: ordered.  Risk Prescription drug management. Decision regarding hospitalization.   General surgeon has evaluated patient and recommends continued work-up for alternative causes of pain, including potential kidney stone.  CT renal stone study ordered, subsequently reviewed, notable for no other emergent findings (gallstones noted).  No pancreatic inflammation.  Ultrasound does not show common bile duct dilation and suspect ongoing gallstone pancreatitis.  However with elevated lipase it is possible the patient did pass a gallstone.  Patient will be admitted to the hospitalist for an AKI.  General surgery recommends pain and nausea medications and IV fluids overnight.  They recommend the patient be kept n.p.o. overnight, in case they do decide to perform an operation on the gallbladder tomorrow.  At this time there are no concrete plans for an operation.  She was admitted to the hospitalist at 515 pm      Wyvonnia Dusky, MD 11/20/21 [id number]

## 2021-11-20 NOTE — ED Provider Notes (Signed)
[place] DEPT Provider Note   CSN: [id number] Arrival date & time: 11/20/21  [id number]     History  Chief Complaint  Patient presents with   Abdominal Pain    Barry [name] is a 46 y.o. male.  The history is provided by the patient and medical records. No language interpreter was used.  Abdominal Pain Pain location:  RUQ and epigastric Pain quality: aching and sharp   Pain radiates to:  R flank Pain severity:  Severe Onset quality:  Gradual Duration:  2 weeks Timing:  Constant Progression:  Waxing and waning Chronicity:  Recurrent Context: not trauma   Relieved by:  Nothing Worsened by:  Nothing Ineffective treatments:  None tried Associated symptoms: fatigue   Associated symptoms: no chest pain, no chills, no constipation, no cough, no diarrhea, no dysuria, no fever, no nausea, no shortness of breath and no vomiting        Home Medications Prior to Admission medications   Medication Sig Start Date End Date Taking? Authorizing Provider  Accu-Chek Softclix Lancets lancets 1 each 3 (three) times daily. 02/01/21   [provider]  allopurinol (ZYLOPRIM) 300 MG tablet Take 300 mg by mouth daily. 10/22/19   [provider]  amLODipine (NORVASC) 10 MG tablet Take 1 tablet (10 mg total) by mouth daily. 06/28/21 07/28/21  Damita Lack, MD  amoxicillin-clavulanate (AUGMENTIN) 875-125 MG tablet Take 1 tablet by mouth every 12 (twelve) hours. 10/11/21   Maudie Flakes, MD  aspirin 81 MG chewable tablet Chew 81 mg by mouth daily. 05/03/17   [provider]  atorvastatin (LIPITOR) 80 MG tablet Take 80 mg by mouth daily.  11/21/17   [provider]  Blood Glucose Monitoring Suppl (GLUCOCOM BLOOD GLUCOSE MONITOR) DEVI 1 each by Other route 3 (three) times daily. 01/30/21   [provider]  colchicine 0.6 MG tablet Take 1 tablet (0.6 mg total) by mouth daily as needed. 06/27/21   Amin, Jeanella Flattery, MD  fluticasone  (FLONASE) 50 MCG/ACT nasal spray Place 2 sprays into both nostrils daily for 5 days. 10/11/21 10/16/21  Maudie Flakes, MD  furosemide (LASIX) 20 MG tablet Take 20 mg by mouth daily. 01/17/20   [provider]  gentamicin ointment (GARAMYCIN) 0.1 % Apply 1 Application topically daily. For wound care 08/19/21   Edrick Kins, DPM  HUMALOG MIX 75/25 (75-25) 100 UNIT/ML SUSP injection Inject 100 Units into the skin 2 (two) times a day. 06/07/18   [provider]  HYDROcodone-acetaminophen (NORCO/VICODIN) 5-325 MG tablet Take 1 tablet by mouth every 4 (four) hours as needed. 10/11/21   Maudie Flakes, MD  JARDIANCE 25 MG TABS tablet Take 25 mg by mouth daily. 10/18/18   [provider]  losartan (COZAAR) 25 MG tablet Take 1 tablet (25 mg total) by mouth 2 (two) times daily. 06/27/21 07/27/21  Amin, Jeanella Flattery, MD  naloxone [facility]) nasal spray 4 mg/0.1 mL Place 1 spray into the nose once. 06/12/21   [provider]  oxyCODONE-acetaminophen (PERCOCET) 5-325 MG tablet Take 1 tablet by mouth every 8 (eight) hours as needed for severe pain. Patient not taking: Reported on 08/19/2021 07/29/21   Edrick Kins, DPM  OZEMPIC, 0.25 OR 0.5 MG/DOSE, 2 MG/3ML SOPN Inject 0.5 mg as directed once a week. fridays 05/21/21   [provider]  pregabalin (LYRICA) 75 MG capsule Take 1 capsule (75 mg total) by mouth 2 (two) times daily. 08/23/19   Landis Martins,  DPM  RELION INSULIN SYRINGE 1ML/31G 31G X 5/16" 1 ML MISC Inject 1 Syringe into the skin 2 (two) times daily. 01/26/21   [provider]  silver sulfADIAZINE (SILVADENE) 1 % cream Apply pea-sized amount to wound daily. 02/14/21   Evelina Bucy, DPM      Allergies    Patient has no known allergies.    Review of Systems   Review of Systems  Constitutional:  Positive for fatigue. Negative for chills, diaphoresis and fever.  HENT:  Negative for congestion.   Respiratory:  Negative for cough, chest tightness, shortness  of breath and wheezing.   Cardiovascular:  Negative for chest pain, palpitations and leg swelling.  Gastrointestinal:  Positive for abdominal pain. Negative for abdominal distention, constipation, diarrhea, nausea and vomiting.  Genitourinary:  Positive for flank pain. Negative for dysuria and frequency.  Musculoskeletal:  Positive for back pain. Negative for neck pain and neck stiffness.  Skin:  Negative for rash and wound.  Neurological:  Negative for dizziness, weakness, light-headedness and headaches.  Psychiatric/Behavioral:  Negative for agitation and confusion.   All other systems reviewed and are negative.   Physical Exam Updated Vital Signs BP 138/83 (BP Location: Left Arm)   Pulse 80   Temp (!) 97.3 F (36.3 C) (Oral)   Resp 18   Ht 6\' 3"  (1.905 m)   Wt (!) 158.8 kg   SpO2 97%   BMI 43.75 kg/m  Physical Exam Vitals and nursing note reviewed.  Constitutional:      General: He is not in acute distress.    Appearance: He is well-developed. He is not ill-appearing, toxic-appearing or diaphoretic.  HENT:     Head: Normocephalic and atraumatic.  Eyes:     Conjunctiva/sclera: Conjunctivae normal.  Cardiovascular:     Rate and Rhythm: Normal rate and regular rhythm.     Heart sounds: No murmur heard. Pulmonary:     Effort: Pulmonary effort is normal. No respiratory distress.     Breath sounds: Normal breath sounds. No wheezing, rhonchi or rales.  Chest:     Chest wall: No tenderness.  Abdominal:     General: Abdomen is flat. Bowel sounds are normal.     Palpations: Abdomen is soft.     Tenderness: There is abdominal tenderness in the right upper quadrant and epigastric area.  Musculoskeletal:        General: No swelling.     Cervical back: Neck supple.  Skin:    General: Skin is warm and dry.     Capillary Refill: Capillary refill takes less than 2 seconds.     Findings: No rash.  Neurological:     General: No focal deficit present.     Mental Status: He is  alert.  Psychiatric:        Mood and Affect: Mood normal.     ED Results / Procedures / Treatments   Labs (all labs ordered are listed, but only abnormal results are displayed) Labs Reviewed  COMPREHENSIVE METABOLIC PANEL - Abnormal; Notable for the following components:      Result Value   Sodium 134 (*)    CO2 21 (*)    Glucose, Bld 136 (*)    BUN 48 (*)    Creatinine, Ser 2.68 (*)    Calcium 8.3 (*)    Albumin 3.2 (*)    Total Bilirubin 0.2 (*)    GFR, Estimated 29 (*)    All other components within normal limits  LIPASE, BLOOD -  Abnormal; Notable for the following components:   Lipase 119 (*)    All other components within normal limits  CBC WITH DIFFERENTIAL/PLATELET - Abnormal; Notable for the following components:   Eosinophils Absolute 0.6 (*)    All other components within normal limits  URINALYSIS, ROUTINE W REFLEX MICROSCOPIC - Abnormal; Notable for the following components:   Color, Urine STRAW (*)    Glucose, UA >=500 (*)    Protein, ur 100 (*)    Bacteria, UA RARE (*)    All other components within normal limits  CBG MONITORING, ED - Abnormal; Notable for the following components:   Glucose-Capillary 142 (*)    All other components within normal limits  PROTIME-INR    EKG None  Radiology US Abdomen Limited RUQ (LIVER/GB)  Result Date: 11/20/2021 CLINICAL DATA:  RIGHT upper quadrant abdominal pain EXAM: ULTRASOUND ABDOMEN LIMITED RIGHT UPPER QUADRANT COMPARISON:  05/12/2020 FINDINGS: Gallbladder: Multiple shadowing calculi within gallbladder up to 8 mm diameter. No gallbladder wall thickening, pericholecystic fluid, or sonographic Murphy sign. Small amount of associated sludge in gallbladder. Common bile duct: Diameter: 3 mm Liver: Slightly echogenic parenchyma, likely fatty infiltration though this can be seen with cirrhosis and certain infiltrative disorders. No definite hepatic mass or nodularity. Portal vein is patent on color Doppler imaging with normal  direction of blood flow towards the liver. Other: No RIGHT upper quadrant free fluid. IMPRESSION: Cholelithiasis without evidence of acute cholecystitis or biliary dilatation. Probable fatty infiltration of liver as above. Electronically Signed   By: Lavonia Dana M.D.   On: 11/20/2021 09:55    Procedures Procedures    Medications Ordered in ED Medications  fentaNYL (SUBLIMAZE) injection 50 mcg (50 mcg Intravenous Given 11/20/21 0949)  ondansetron (ZOFRAN) injection 4 mg (4 mg Intravenous Given 11/20/21 0950)  sodium chloride 0.9 % bolus 1,000 mL (0 mLs Intravenous Stopped 11/20/21 1052)    ED Course/ Medical Decision Making/ A&P                           Medical Decision Making Amount and/or Complexity of Data Reviewed Labs: ordered. Radiology: ordered.  Risk Prescription drug management.    Barry [name] is a 46 y.o. male with a past medical history significant for hyperlipidemia, hypertension, hypothyroidism, diabetes with previous DKA, thyroid disease, CKD, OSA, cholelithiasis, and documentation of previous cholecystitis who has not had a cholecystectomy presents with worsening right upper quadrant abdominal pain and transient hypoglycemia with blurry vision.  Patient reports that for the last several weeks he has had worsening right upper quadrant abdominal pain.  He reports this feels like his gallbladder pain.  He reports last few days it has acutely worsened and is up to 10 out of 10 in severity at times.  He denies nausea or vomiting but does feel tired.  He said that overnight he had several hours of blurry vision that he correlates to when he gets hypoglycemic and he read his glucose was in the 50s.  It has since improved and his symptoms have resolved.  He has had that happen in the past and denies any other complaints.  Denies any constipation, diarrhea, urinary changes.  Denies trauma.  Denies rashes.  Denies fevers or chills.  Denies any chest pain, shortness of breath, or cough.   Denies URI symptoms.  Denies any other injuries or complaints with the pain.  On exam, lungs clear and chest nontender.  Abdomen is tender primarily right upper quadrant  going towards his right flank.  Rest of abdomen nontender.  Bowel sounds appreciated.  Exam otherwise unremarkable.  He does have dry mucous membranes.  Patient had work-up starting in triage including labs.  His creatinine is concerning as it is elevated up to 2.68 up from 1.9 previously consistent with AKI.  He does not have LFT elevation.  He has not a leukocytosis or anemia.  His urinalysis does not show nitrites or leukocytes.  Lipase is however more elevated at 119.  He is not tender in the left upper quadrant.  Given his worsening symptoms and lipase elevation now, I am somewhat concerned he could have either cholecystitis, choledocholithiasis, or other intra-abdominal pathology.  We will get medical ultrasound initially to look for recurrent cholecystitis.  Anticipate reassessment after ultrasound.  Will give medicine to help with pain, nausea, and fluids for the AKI.  12:35 PM Ultrasound showed gallstones and sludge but otherwise did not show convincing evidence of acute cholecystitis.  Given the worsening pain, location of his discomfort, lipase elevation, I am still concerned about gallstones being the problem.  We will speak with general surgery about suspected symptomatic cholelithiasis or even symptomatic choledocholithiasis.   Awaiting general surgery recommendations to see if this is something they would like to be involved with or admit to medicine for the AKI, pain, and suspected pancreatitis.  Care transferred to oncoming team to await final recommendations from general surgery.         Final Clinical Impression(s) / ED Diagnoses Final diagnoses:  Left upper quadrant abdominal pain  AKI (acute kidney injury) ([place])  Serum lipase elevation    Clinical Impression: 1. Left upper quadrant abdominal pain    2. AKI (acute kidney injury) ([place])   3. Serum lipase elevation     Disposition: Awaiting final recommendation of general surgery but anticipate admission for further management.  This note was prepared with assistance of Systems analyst. Occasional wrong-word or sound-a-like substitutions may have occurred due to the inherent limitations of voice recognition software.      Lavonte Palos, Gwenyth Allegra, MD 11/20/21 1556

## 2021-11-21 ENCOUNTER — Observation Stay (HOSPITAL_COMMUNITY): Payer: BC Managed Care – PPO | Admitting: Anesthesiology

## 2021-11-21 ENCOUNTER — Encounter (HOSPITAL_COMMUNITY): Payer: Self-pay | Admitting: Internal Medicine

## 2021-11-21 ENCOUNTER — Other Ambulatory Visit: Payer: Self-pay

## 2021-11-21 ENCOUNTER — Encounter (HOSPITAL_COMMUNITY)
Admission: EM | Disposition: A | Discharge status: Home / Self Care | Payer: Self-pay | Source: Home / Self Care | Attending: Internal Medicine

## 2021-11-21 DIAGNOSIS — E1165 Type 2 diabetes mellitus with hyperglycemia: Secondary | ICD-10-CM | POA: Diagnosis present

## 2021-11-21 DIAGNOSIS — K8020 Calculus of gallbladder without cholecystitis without obstruction: Secondary | ICD-10-CM | POA: Diagnosis not present

## 2021-11-21 DIAGNOSIS — N1831 Chronic kidney disease, stage 3a: Secondary | ICD-10-CM | POA: Diagnosis present

## 2021-11-21 DIAGNOSIS — Z79899 Other long term (current) drug therapy: Secondary | ICD-10-CM | POA: Diagnosis not present

## 2021-11-21 DIAGNOSIS — I129 Hypertensive chronic kidney disease with stage 1 through stage 4 chronic kidney disease, or unspecified chronic kidney disease: Secondary | ICD-10-CM | POA: Diagnosis present

## 2021-11-21 DIAGNOSIS — Z7984 Long term (current) use of oral hypoglycemic drugs: Secondary | ICD-10-CM | POA: Diagnosis not present

## 2021-11-21 DIAGNOSIS — Z7985 Long-term (current) use of injectable non-insulin antidiabetic drugs: Secondary | ICD-10-CM | POA: Diagnosis not present

## 2021-11-21 DIAGNOSIS — G4733 Obstructive sleep apnea (adult) (pediatric): Secondary | ICD-10-CM | POA: Diagnosis present

## 2021-11-21 DIAGNOSIS — K8050 Calculus of bile duct without cholangitis or cholecystitis without obstruction: Secondary | ICD-10-CM | POA: Diagnosis not present

## 2021-11-21 DIAGNOSIS — Z794 Long term (current) use of insulin: Secondary | ICD-10-CM | POA: Diagnosis not present

## 2021-11-21 DIAGNOSIS — E7800 Pure hypercholesterolemia, unspecified: Secondary | ICD-10-CM | POA: Diagnosis present

## 2021-11-21 DIAGNOSIS — E1142 Type 2 diabetes mellitus with diabetic polyneuropathy: Secondary | ICD-10-CM | POA: Diagnosis present

## 2021-11-21 DIAGNOSIS — E039 Hypothyroidism, unspecified: Secondary | ICD-10-CM | POA: Diagnosis present

## 2021-11-21 DIAGNOSIS — Z89422 Acquired absence of other left toe(s): Secondary | ICD-10-CM | POA: Diagnosis not present

## 2021-11-21 DIAGNOSIS — N179 Acute kidney failure, unspecified: Secondary | ICD-10-CM | POA: Diagnosis present

## 2021-11-21 DIAGNOSIS — E871 Hypo-osmolality and hyponatremia: Secondary | ICD-10-CM | POA: Diagnosis present

## 2021-11-21 DIAGNOSIS — Z7982 Long term (current) use of aspirin: Secondary | ICD-10-CM | POA: Diagnosis not present

## 2021-11-21 DIAGNOSIS — K859 Acute pancreatitis without necrosis or infection, unspecified: Secondary | ICD-10-CM | POA: Diagnosis present

## 2021-11-21 DIAGNOSIS — Z6841 Body Mass Index (BMI) 40.0 and over, adult: Secondary | ICD-10-CM | POA: Diagnosis not present

## 2021-11-21 DIAGNOSIS — E1122 Type 2 diabetes mellitus with diabetic chronic kidney disease: Secondary | ICD-10-CM | POA: Diagnosis present

## 2021-11-21 DIAGNOSIS — E119 Type 2 diabetes mellitus without complications: Secondary | ICD-10-CM | POA: Diagnosis not present

## 2021-11-21 DIAGNOSIS — M109 Gout, unspecified: Secondary | ICD-10-CM | POA: Diagnosis present

## 2021-11-21 DIAGNOSIS — I10 Essential (primary) hypertension: Secondary | ICD-10-CM | POA: Diagnosis not present

## 2021-11-21 HISTORY — PX: CHOLECYSTECTOMY: SHX55

## 2021-11-21 LAB — GLUCOSE, CAPILLARY
Glucose-Capillary: 269 mg/dL — ABNORMAL HIGH (ref 70–99)
Glucose-Capillary: 241 mg/dL — ABNORMAL HIGH (ref 70–99)
Glucose-Capillary: 190 mg/dL — ABNORMAL HIGH (ref 70–99)
Glucose-Capillary: 187 mg/dL — ABNORMAL HIGH (ref 70–99)
Glucose-Capillary: 205 mg/dL — ABNORMAL HIGH (ref 70–99)
Glucose-Capillary: 215 mg/dL — ABNORMAL HIGH (ref 70–99)
Glucose-Capillary: 217 mg/dL — ABNORMAL HIGH (ref 70–99)
Glucose-Capillary: 226 mg/dL — ABNORMAL HIGH (ref 70–99)
Glucose-Capillary: 197 mg/dL — ABNORMAL HIGH (ref 70–99)
Glucose-Capillary: 294 mg/dL — ABNORMAL HIGH (ref 70–99)

## 2021-11-21 LAB — CBC WITH DIFFERENTIAL/PLATELET
Abs Immature Granulocytes: 0.02 10*3/uL (ref 0.00–0.07)
Basophils Absolute: 0.1 10*3/uL (ref 0.0–0.1)
Basophils Relative: 1 %
Eosinophils Absolute: 0.5 10*3/uL (ref 0.0–0.5)
Eosinophils Relative: 7 %
HCT: 39.9 % (ref 39.0–52.0)
Hemoglobin: 13.6 g/dL (ref 13.0–17.0)
Immature Granulocytes: 0 %
Lymphocytes Relative: 32 %
Lymphs Abs: 2.2 10*3/uL (ref 0.7–4.0)
MCH: 30.9 pg (ref 26.0–34.0)
MCHC: 34.1 g/dL (ref 30.0–36.0)
MCV: 90.7 fL (ref 80.0–100.0)
Monocytes Absolute: 0.8 10*3/uL (ref 0.1–1.0)
Monocytes Relative: 11 %
Neutro Abs: 3.4 10*3/uL (ref 1.7–7.7)
Neutrophils Relative %: 49 %
Platelets: 239 10*3/uL (ref 150–400)
RBC: 4.4 MIL/uL (ref 4.22–5.81)
RDW: 13.6 % (ref 11.5–15.5)
WBC: 6.9 10*3/uL (ref 4.0–10.5)
nRBC: 0 % (ref 0.0–0.2)

## 2021-11-21 LAB — COMPREHENSIVE METABOLIC PANEL
ALT: 16 U/L (ref 0–44)
AST: 17 U/L (ref 15–41)
Albumin: 3 g/dL — ABNORMAL LOW (ref 3.5–5.0)
Alkaline Phosphatase: 55 U/L (ref 38–126)
Anion gap: 6 (ref 5–15)
BUN: 39 mg/dL — ABNORMAL HIGH (ref 6–20)
CO2: 22 mmol/L (ref 22–32)
Calcium: 8.4 mg/dL — ABNORMAL LOW (ref 8.9–10.3)
Chloride: 105 mmol/L (ref 98–111)
Creatinine, Ser: 2.12 mg/dL — ABNORMAL HIGH (ref 0.61–1.24)
GFR, Estimated: 39 mL/min — ABNORMAL LOW (ref >60)
Glucose, Bld: 322 mg/dL — ABNORMAL HIGH (ref 70–99)
Potassium: 4.5 mmol/L (ref 3.5–5.1)
Sodium: 133 mmol/L — ABNORMAL LOW (ref 135–145)
Total Bilirubin: 0.8 mg/dL (ref 0.3–1.2)
Total Protein: 6.5 g/dL (ref 6.5–8.1)

## 2021-11-21 LAB — LIPASE, BLOOD: Lipase: 42 U/L (ref 11–51)

## 2021-11-21 SURGERY — LAPAROSCOPIC CHOLECYSTECTOMY WITH INTRAOPERATIVE CHOLANGIOGRAM
Anesthesia: General | Site: Abdomen

## 2021-11-21 MED ORDER — INSULIN ASPART 100 UNIT/ML IJ SOLN
5.0000 [IU] | Freq: Once | INTRAMUSCULAR | Status: AC
  Administered 2021-11-21: 5 [IU] via SUBCUTANEOUS

## 2021-11-21 MED ORDER — ACETAMINOPHEN 500 MG PO TABS
1000.0000 mg | ORAL_TABLET | Freq: Four times a day (QID) | ORAL | Status: DC
  Administered 2021-11-21 – 2021-11-23 (×7): 1000 mg via ORAL
  Filled 2021-11-21 (×6): qty 2

## 2021-11-21 MED ORDER — PROPOFOL 10 MG/ML IV BOLUS
INTRAVENOUS | Status: AC
  Filled 2021-11-21: qty 20

## 2021-11-21 MED ORDER — BUPIVACAINE-EPINEPHRINE 0.5% -1:200000 IJ SOLN
INTRAMUSCULAR | Status: AC
  Filled 2021-11-21: qty 1

## 2021-11-21 MED ORDER — ROCURONIUM BROMIDE 10 MG/ML (PF) SYRINGE
PREFILLED_SYRINGE | INTRAVENOUS | Status: DC | PRN
  Administered 2021-11-21: 100 mg via INTRAVENOUS

## 2021-11-21 MED ORDER — LIDOCAINE HCL (PF) 2 % IJ SOLN
INTRAMUSCULAR | Status: AC
  Filled 2021-11-21: qty 5

## 2021-11-21 MED ORDER — FENTANYL CITRATE PF 50 MCG/ML IJ SOSY
PREFILLED_SYRINGE | INTRAMUSCULAR | Status: AC
  Filled 2021-11-21: qty 1

## 2021-11-21 MED ORDER — HYDRALAZINE HCL 20 MG/ML IJ SOLN
5.0000 mg | Freq: Once | INTRAMUSCULAR | Status: AC
  Administered 2021-11-21: 5 mg via INTRAVENOUS

## 2021-11-21 MED ORDER — PHENYLEPHRINE 80 MCG/ML (10ML) SYRINGE FOR IV PUSH (FOR BLOOD PRESSURE SUPPORT)
PREFILLED_SYRINGE | INTRAVENOUS | Status: DC | PRN
  Administered 2021-11-21 (×2): 160 ug via INTRAVENOUS

## 2021-11-21 MED ORDER — DEXTROSE 5 % IV SOLN: 3.0000 g | INTRAVENOUS | Status: DC

## 2021-11-21 MED ORDER — INSULIN ASPART 100 UNIT/ML IJ SOLN
INTRAMUSCULAR | Status: AC
  Filled 2021-11-21: qty 1

## 2021-11-21 MED ORDER — PHENYLEPHRINE HCL (PRESSORS) 10 MG/ML IV SOLN
INTRAVENOUS | Status: AC
  Filled 2021-11-21: qty 1

## 2021-11-21 MED ORDER — PROPOFOL 10 MG/ML IV BOLUS
INTRAVENOUS | Status: DC | PRN
  Administered 2021-11-21: 200 mg via INTRAVENOUS

## 2021-11-21 MED ORDER — ACETAMINOPHEN 10 MG/ML IV SOLN
1000.0000 mg | Freq: Once | INTRAVENOUS | Status: DC | PRN
  Administered 2021-11-21: 1000 mg via INTRAVENOUS

## 2021-11-21 MED ORDER — ROCURONIUM BROMIDE 10 MG/ML (PF) SYRINGE
PREFILLED_SYRINGE | INTRAVENOUS | Status: AC
  Filled 2021-11-21: qty 10

## 2021-11-21 MED ORDER — INSULIN ASPART 100 UNIT/ML IJ SOLN
0.0000 [IU] | INTRAMUSCULAR | Status: DC
  Administered 2021-11-21: 5 [IU] via SUBCUTANEOUS
  Administered 2021-11-21 – 2021-11-22 (×2): 8 [IU] via SUBCUTANEOUS
  Administered 2021-11-22: 3 [IU] via SUBCUTANEOUS
  Administered 2021-11-22: 11 [IU] via SUBCUTANEOUS
  Administered 2021-11-22 – 2021-11-23 (×4): 3 [IU] via SUBCUTANEOUS
  Administered 2021-11-23: 5 [IU] via SUBCUTANEOUS

## 2021-11-21 MED ORDER — ONDANSETRON HCL 4 MG/2ML IJ SOLN
INTRAMUSCULAR | Status: DC | PRN
  Administered 2021-11-21: 4 mg via INTRAVENOUS

## 2021-11-21 MED ORDER — INSULIN GLARGINE-YFGN 100 UNIT/ML ~~LOC~~ SOLN
10.0000 [IU] | Freq: Every day | SUBCUTANEOUS | Status: DC
  Filled 2021-11-21: qty 0.1

## 2021-11-21 MED ORDER — ACETAMINOPHEN 10 MG/ML IV SOLN
INTRAVENOUS | Status: AC
  Filled 2021-11-21: qty 100

## 2021-11-21 MED ORDER — ATORVASTATIN CALCIUM 20 MG PO TABS
80.0000 mg | ORAL_TABLET | Freq: Every day | ORAL | Status: DC
  Administered 2021-11-21 – 2021-11-23 (×3): 80 mg via ORAL
  Filled 2021-11-21 (×3): qty 4

## 2021-11-21 MED ORDER — PHENYLEPHRINE 80 MCG/ML (10ML) SYRINGE FOR IV PUSH (FOR BLOOD PRESSURE SUPPORT)
PREFILLED_SYRINGE | INTRAVENOUS | Status: AC
  Filled 2021-11-21: qty 10

## 2021-11-21 MED ORDER — FENTANYL CITRATE PF 50 MCG/ML IJ SOSY
25.0000 ug | PREFILLED_SYRINGE | INTRAMUSCULAR | Status: DC | PRN
  Administered 2021-11-21 (×3): 50 ug via INTRAVENOUS

## 2021-11-21 MED ORDER — EPHEDRINE SULFATE-NACL 50-0.9 MG/10ML-% IV SOSY
PREFILLED_SYRINGE | INTRAVENOUS | Status: DC | PRN
  Administered 2021-11-21 (×2): 10 mg via INTRAVENOUS

## 2021-11-21 MED ORDER — SODIUM CHLORIDE 0.9 % IV SOLN
2.0000 g | INTRAVENOUS | Status: AC
  Administered 2021-11-21: 2 g via INTRAVENOUS
  Filled 2021-11-21: qty 20

## 2021-11-21 MED ORDER — SUGAMMADEX SODIUM 500 MG/5ML IV SOLN
INTRAVENOUS | Status: AC
  Filled 2021-11-21: qty 5

## 2021-11-21 MED ORDER — FENTANYL CITRATE (PF) 250 MCG/5ML IJ SOLN
INTRAMUSCULAR | Status: AC
  Filled 2021-11-21: qty 5

## 2021-11-21 MED ORDER — FENTANYL CITRATE (PF) 250 MCG/5ML IJ SOLN
INTRAMUSCULAR | Status: DC | PRN
  Administered 2021-11-21 (×3): 50 ug via INTRAVENOUS

## 2021-11-21 MED ORDER — DEXAMETHASONE SODIUM PHOSPHATE 10 MG/ML IJ SOLN
INTRAMUSCULAR | Status: AC
  Filled 2021-11-21: qty 1

## 2021-11-21 MED ORDER — ENOXAPARIN SODIUM 80 MG/0.8ML IJ SOSY
80.0000 mg | PREFILLED_SYRINGE | INTRAMUSCULAR | Status: DC
  Administered 2021-11-22: 80 mg via SUBCUTANEOUS
  Filled 2021-11-21: qty 0.8

## 2021-11-21 MED ORDER — MIDAZOLAM HCL 2 MG/2ML IJ SOLN
INTRAMUSCULAR | Status: AC
  Filled 2021-11-21: qty 2

## 2021-11-21 MED ORDER — SUGAMMADEX SODIUM 500 MG/5ML IV SOLN
INTRAVENOUS | Status: DC | PRN
  Administered 2021-11-21: 400 mg via INTRAVENOUS

## 2021-11-21 MED ORDER — HYDRALAZINE HCL 20 MG/ML IJ SOLN
INTRAMUSCULAR | Status: AC
  Filled 2021-11-21: qty 1

## 2021-11-21 MED ORDER — EPHEDRINE 5 MG/ML INJ
INTRAVENOUS | Status: AC
  Filled 2021-11-21: qty 5

## 2021-11-21 MED ORDER — BUPIVACAINE-EPINEPHRINE 0.5% -1:200000 IJ SOLN
INTRAMUSCULAR | Status: DC | PRN
  Administered 2021-11-21: 50 mL

## 2021-11-21 MED ORDER — ONDANSETRON HCL 4 MG/2ML IJ SOLN
INTRAMUSCULAR | Status: AC
  Filled 2021-11-21: qty 2

## 2021-11-21 MED ORDER — OXYCODONE HCL 5 MG PO TABS
5.0000 mg | ORAL_TABLET | ORAL | Status: DC | PRN
  Administered 2021-11-21 – 2021-11-23 (×6): 5 mg via ORAL
  Filled 2021-11-21 (×6): qty 1

## 2021-11-21 MED ORDER — MIDAZOLAM HCL 2 MG/2ML IJ SOLN
INTRAMUSCULAR | Status: DC | PRN
  Administered 2021-11-21: 2 mg via INTRAVENOUS

## 2021-11-21 MED ORDER — 0.9 % SODIUM CHLORIDE (POUR BTL) OPTIME
TOPICAL | Status: DC | PRN
  Administered 2021-11-21: 1000 mL

## 2021-11-21 MED ORDER — DULOXETINE HCL 30 MG PO CPEP
30.0000 mg | ORAL_CAPSULE | Freq: Every day | ORAL | Status: DC
  Administered 2021-11-21 – 2021-11-23 (×3): 30 mg via ORAL
  Filled 2021-11-21 (×3): qty 1

## 2021-11-21 MED ORDER — LACTATED RINGERS IR SOLN
Status: DC | PRN
  Administered 2021-11-21: 1000 mL

## 2021-11-21 MED ORDER — LIDOCAINE 2% (20 MG/ML) 5 ML SYRINGE
INTRAMUSCULAR | Status: DC | PRN
  Administered 2021-11-21: 80 mg via INTRAVENOUS

## 2021-11-21 MED ORDER — SODIUM CHLORIDE 0.9 % IV SOLN: INTRAVENOUS | Status: DC

## 2021-11-21 MED ORDER — PHENYLEPHRINE HCL-NACL 20-0.9 MG/250ML-% IV SOLN
INTRAVENOUS | Status: DC | PRN
  Administered 2021-11-21: 50 ug/min via INTRAVENOUS

## 2021-11-21 SURGICAL SUPPLY — 42 items
ADH SKN CLS APL DERMABOND .7 (GAUZE/BANDAGES/DRESSINGS) ×1
APL PRP STRL LF DISP 70% ISPRP (MISCELLANEOUS) ×2
APPLIER CLIP 5 13 M/L LIGAMAX5 (MISCELLANEOUS) ×1
APR CLP MED LRG 5 ANG JAW (MISCELLANEOUS) ×1
BAG COUNTER SPONGE SURGICOUNT (BAG) IMPLANT
BAG SPEC RTRVL 10 TROC 200 (ENDOMECHANICALS) ×1
BAG SPNG CNTER NS LX DISP (BAG)
CABLE HIGH FREQUENCY MONO STRZ (ELECTRODE) ×2 IMPLANT
CHLORAPREP W/TINT 26 (MISCELLANEOUS) ×2 IMPLANT
CLIP APPLIE 5 13 M/L LIGAMAX5 (MISCELLANEOUS) IMPLANT
COVER MAYO STAND XLG (MISCELLANEOUS) ×2 IMPLANT
COVER SURGICAL LIGHT HANDLE (MISCELLANEOUS) ×2 IMPLANT
DERMABOND ADVANCED .7 DNX12 (GAUZE/BANDAGES/DRESSINGS) ×2 IMPLANT
ELECT REM PT RETURN 15FT ADLT (MISCELLANEOUS) ×2 IMPLANT
ENDOLOOP SUT PDS II  0 18 (SUTURE) ×1
ENDOLOOP SUT PDS II 0 18 (SUTURE) IMPLANT
GLOVE BIO SURGEON STRL SZ7.5 (GLOVE) ×2 IMPLANT
GLOVE BIOGEL PI IND STRL 8 (GLOVE) ×2 IMPLANT
GOWN STRL REUS W/ TWL XL LVL3 (GOWN DISPOSABLE) ×4 IMPLANT
GOWN STRL REUS W/TWL XL LVL3 (GOWN DISPOSABLE) ×2
GRASPER SUT TROCAR 14GX15 (MISCELLANEOUS) IMPLANT
HEMOSTAT SNOW SURGICEL 2X4 (HEMOSTASIS) IMPLANT
IRRIG SUCT STRYKERFLOW 2 WTIP (MISCELLANEOUS) ×1
IRRIGATION SUCT STRKRFLW 2 WTP (MISCELLANEOUS) ×2 IMPLANT
IV CATH 14GX2 1/4 (CATHETERS) ×2 IMPLANT
KIT BASIN OR (CUSTOM PROCEDURE TRAY) ×2 IMPLANT
KIT TURNOVER KIT A (KITS) IMPLANT
NDL INSUFFLATION 14GA 120MM (NEEDLE) ×2 IMPLANT
NEEDLE INSUFFLATION 14GA 120MM (NEEDLE) ×1 IMPLANT
PENCIL SMOKE EVACUATOR (MISCELLANEOUS) IMPLANT
POUCH RETRIEVAL ECOSAC 10 (ENDOMECHANICALS) ×2 IMPLANT
POUCH RETRIEVAL ECOSAC 10MM (ENDOMECHANICALS) ×1
SCISSORS LAP 5X35 DISP (ENDOMECHANICALS) ×2 IMPLANT
SET TUBE SMOKE EVAC HIGH FLOW (TUBING) ×2 IMPLANT
SLEEVE Z-THREAD 5X100MM (TROCAR) ×4 IMPLANT
SPIKE FLUID TRANSFER (MISCELLANEOUS) ×2 IMPLANT
SUT MNCRL AB 4-0 PS2 18 (SUTURE) ×2 IMPLANT
TOWEL OR 17X26 10 PK STRL BLUE (TOWEL DISPOSABLE) ×2 IMPLANT
TOWEL OR NON WOVEN STRL DISP B (DISPOSABLE) IMPLANT
TRAY LAPAROSCOPIC (CUSTOM PROCEDURE TRAY) ×2 IMPLANT
TROCAR ADV FIXATION 12X100MM (TROCAR) ×2 IMPLANT
TROCAR Z-THREAD OPTICAL 5X100M (TROCAR) ×2 IMPLANT

## 2021-11-21 NOTE — Progress Notes (Signed)
Day of Surgery  Subjective: Still with pain in RUQ.  No other complaints except hungry and would like to eat  ROS: See above, otherwise other systems negative  Objective: Vital signs in last 24 hours: Temp:  [97.3 F (36.3 C)-98.1 F (36.7 C)] 97.5 F (36.4 C) (11/02 0558) Pulse Rate:  [64-85] 80 (11/02 0558) Resp:  [18] 18 (11/02 0558) BP: (114-152)/(68-102) 137/75 (11/02 0558) SpO2:  [92 %-99 %] 95 % (11/02 0558) Last BM Date : 11/20/21  Intake/Output from previous day: 11/01 0701 - 11/02 0700 In: 357.3 [I.V.:357.3] Out: -  Intake/Output this shift: No intake/output data recorded.  PE: Heart: regular Lungs: CTAB Abd: soft, obese, tender in RUQ, ND  Lab Results:  Recent Labs    11/20/21 0545 11/21/21 0443  WBC 9.0 6.9  HGB 14.0 13.6  HCT 39.7 39.9  PLT 277 239   BMET Recent Labs    11/20/21 0545 11/21/21 0443  NA 134* 133*  K 3.9 4.5  CL 106 105  CO2 21* 22  GLUCOSE 136* 322*  BUN 48* 39*  CREATININE 2.68* 2.12*  CALCIUM 8.3* 8.4*   PT/INR Recent Labs    11/20/21 1218  LABPROT 12.8  INR 1.0   CMP     Component Value Date/Time   NA 133 (L) 11/21/2021 0443   K 4.5 11/21/2021 0443   CL 105 11/21/2021 0443   CO2 22 11/21/2021 0443   GLUCOSE 322 (H) 11/21/2021 0443   BUN 39 (H) 11/21/2021 0443   CREATININE 2.12 (H) 11/21/2021 0443   CALCIUM 8.4 (L) 11/21/2021 0443   PROT 6.5 11/21/2021 0443   ALBUMIN 3.0 (L) 11/21/2021 0443   AST 17 11/21/2021 0443   ALT 16 11/21/2021 0443   ALKPHOS 55 11/21/2021 0443   BILITOT 0.8 11/21/2021 0443   GFRNONAA 39 (L) 11/21/2021 0443   GFRAA >60 04/27/2019 0820   Lipase     Component Value Date/Time   LIPASE 42 11/21/2021 0443       Studies/Results: CT Renal Stone Study  Result Date: 11/20/2021 CLINICAL DATA:  Right abdominal pain for 3 weeks. Cholelithiasis. Possible right renal calculi. EXAM: CT ABDOMEN AND PELVIS WITHOUT CONTRAST TECHNIQUE: Multidetector CT imaging of the abdomen and  pelvis was performed following the standard protocol without IV contrast. RADIATION DOSE REDUCTION: This exam was performed according to the departmental dose-optimization program which includes automated exposure control, adjustment of the mA and/or kV according to patient size and/or use of iterative reconstruction technique. COMPARISON:  06/22/2021 FINDINGS: Lower chest: Chronic rounded atelectasis in the right lower lobe on image 1 series 2 as shown on prior exams including 11/04/2018. Adjacent pleural thickening or peripheral atelectasis. Hepatobiliary: Numerous gallstones identified measuring up to about 1.1 cm in long axis. These are of variable size. No definite stones identified in the common bile duct. No compelling evidence of intrahepatic biliary dilatation or focal hepatic lesion on today's noncontrast exam. Pancreas: Unremarkable Spleen: Unremarkable Adrenals/Urinary Tract: Both adrenal glands appear normal. Symmetric mild bilateral perirenal stranding appears to be chronic. No urinary tract calculi. No hydronephrosis or hydroureter. Urinary bladder unremarkable. Stomach/Bowel: Unremarkable, appendix appears unchanged without inflammatory findings. Vascular/Lymphatic: 1.2 cm left periaortic lymph node on image 45 series 2, formerly the same. 1.1 cm right common iliac lymph node on image 58 series 2, formerly 1.2 cm. Additional borderline prominent retroperitoneal, porta hepatis, and iliac lymph nodes similar to previous. Reproductive: Unremarkable Other: No supplemental non-categorized findings. Musculoskeletal: Short pedicles, facet arthropathy, and mild disc  bulges lead to possible mild bilateral foraminal impingement at the L4-5 and L5-S1 levels. IMPRESSION: 1. A specific cause for the patient's right abdominal pain is not identified. 2. Cholelithiasis. 3. Chronic rounded atelectasis in the right lower lobe with adjacent pleural thickening or peripheral atelectasis. 4. Borderline prominent  retroperitoneal, porta hepatis, and iliac lymph nodes, similar to prior. 5. Lower lumbar impingement at L4-5 and L5-S1. Electronically Signed   By: Van Clines M.D.   On: 11/20/2021 16:56   US Abdomen Limited RUQ (LIVER/GB)  Result Date: 11/20/2021 CLINICAL DATA:  RIGHT upper quadrant abdominal pain EXAM: ULTRASOUND ABDOMEN LIMITED RIGHT UPPER QUADRANT COMPARISON:  05/12/2020 FINDINGS: Gallbladder: Multiple shadowing calculi within gallbladder up to 8 mm diameter. No gallbladder wall thickening, pericholecystic fluid, or sonographic Murphy sign. Small amount of associated sludge in gallbladder. Common bile duct: Diameter: 3 mm Liver: Slightly echogenic parenchyma, likely fatty infiltration though this can be seen with cirrhosis and certain infiltrative disorders. No definite hepatic mass or nodularity. Portal vein is patent on color Doppler imaging with normal direction of blood flow towards the liver. Other: No RIGHT upper quadrant free fluid. IMPRESSION: Cholelithiasis without evidence of acute cholecystitis or biliary dilatation. Probable fatty infiltration of liver as above. Electronically Signed   By: Lavonia Dana M.D.   On: 11/20/2021 09:55    Anti-infectives: Anti-infectives (From admission, onward)    Start     Dose/Rate Route Frequency Ordered Stop   11/21/21 0830  cefTRIAXone (ROCEPHIN) 2 g in sodium chloride 0.9 % 100 mL IVPB        2 g 200 mL/hr over 30 Minutes Intravenous On call to O.R. 11/21/21 3790 11/22/21 0559   11/21/21 0815  cefTRIAXone (ROCEPHIN) 3 g in dextrose 5 % 50 mL IVPB  Status:  Discontinued        3 g 100 mL/hr over 30 Minutes Intravenous On call to O.R. 11/21/21 2409 11/21/21 0736        Assessment/Plan Biliary colic -prior attempt at lap chole elsewhere with no success -plan to proceed today to the OR  -NPO until post op -Rocephin on call to OR -CT from yesterday reviewed with no other source of intra-abdominal symptoms.  -lipase normal today  FEN -  NPO/IVFs VTE - lovenox ID - Rocephin on call  DM Obesity hypothyroidism  I reviewed hospitalist notes, last 24 h vitals and pain scores, last 48 h intake and output, last 24 h labs and trends, and last 24 h imaging results.   LOS: 0 days    Barry [name] , [facility] Surgery 11/21/2021, 8:51 AM Please see Amion for pager number during day hours 7:00am-4:30pm or 7:00am -11:30am on weekends

## 2021-11-21 NOTE — Progress Notes (Signed)
[facility]                                                                               Barry [name], is a 46 y.o. male, DOB - Oct 04, 1975, [id number] Admit date - 11/20/2021    Outpatient Primary MD for the patient is Maryella Shivers, MD  LOS - 0  days    Brief summary      Assessment & Plan  Barry [name] is a 46 y.o. male with medical history significant of DM insulin dependent, Gout, hyperlipidemia,  CKD, OSA, obesity, hypothyroidism, reports upper abdominal pain since 3 weeks. He was found to have cholelithiasis. He reports seeing a Psychologist, sport and exercise in [place] and an attempt to remove the GB , but unable to do so due to enlarged liver. He presents to today with nausea, persistent abdominal pain . CT renal study was done , showing cholelithiasis.  US abdomen does not show any acute cholecystitis. General surgery consulted and he was referred to [facility] for admission.     Assessment and Plan:  Persistent abdominal pain since 3 weeks possibly from Biliary colic Pain control.  General surgery on board and plan for lap chole today.  NPO , IV fluids.     Insulin dependent DM;  CBG (last 3)  Recent Labs    11/20/21 2057 11/21/21 0733 11/21/21 1136  GLUCAP 152* 269* 241*   A1c is 10.5% Started him on Semglee and continue with SSI.    Acute kidney injury: on stage 3a CKD Baseline creatinine appears to be between 1.6 to 1.9.  Creatinine improved to 2.12 from 2.68.  Continue with IV fluids.     Body mass index is 43.75 kg/m. Morbid obesity.    Hypertension:  Well controlled.    Mild acute pancreatitis:  - lipase levels normalized.       Estimated body mass index is 43.75 kg/m as calculated from the following:   Height as of this encounter: 6\' 3"  (1.905 m).   Weight as of this encounter: 158.8 kg.  Code Status: full code.  DVT Prophylaxis:  lovenox.    Level of Care: Level of care: Med-Surg Family Communication: none at bedside.    Disposition Plan:     Remains inpatient appropriate:  lap chole today.   Procedures:  LAP cholecystectomy.   Consultants:   General surgery.   Antimicrobials:   Anti-infectives (From admission, onward)    Start     Dose/Rate Route Frequency Ordered Stop   11/21/21 1100  cefTRIAXone (ROCEPHIN) 2 g in sodium chloride 0.9 % 100 mL IVPB        2 g 200 mL/hr over 30 Minutes Intravenous On call to O.R. 11/21/21 0736 11/22/21 0559   11/21/21 0815  cefTRIAXone (ROCEPHIN) 3 g in dextrose 5 % 50 mL IVPB  Status:  Discontinued        3 g 100 mL/hr over 30 Minutes Intravenous On call to O.R. 11/21/21 0717 11/21/21 0736        Medications  Scheduled Meds:  enoxaparin (LOVENOX) injection  80 mg Subcutaneous Q24H   influenza vac split quadrivalent PF  0.5 mL Intramuscular Tomorrow-1000   insulin aspart  0-15 Units Subcutaneous Q4H   insulin aspart  0-5 Units Subcutaneous QHS   insulin glargine-yfgn  10 Units Subcutaneous Daily   Continuous Infusions:  cefTRIAXone (ROCEPHIN)  IV     dextrose 5 % and 0.9% NaCl 50 mL/hr at 11/20/21 2224   PRN Meds:.acetaminophen **OR** acetaminophen, hydrALAZINE, HYDROcodone-acetaminophen, morphine injection    Subjective:   Barry [name] was seen and examined today.  Pain controlled with meds.   Objective:   Vitals:   11/20/21 2100 11/21/21 0147 11/21/21 0558 11/21/21 0916  BP: 133/80 121/69 137/75 (!) 155/92  Pulse: 77 81 80 80  Resp: 18 18 18 17   Temp: 97.7 F (36.5 C) (!) 97.5 F (36.4 C) (!) 97.5 F (36.4 C)   TempSrc: Oral Oral Oral   SpO2: 99% 97% 95% 98%  Weight:      Height:        Intake/Output Summary (Last 24 hours) at 11/21/2021 1155 Last data filed at 11/21/2021 0912 Gross per 24 hour  Intake 357.25 ml  Output --  Net 357.25 ml   Filed Weights   11/20/21 0542  Weight: (!) 158.8 kg     Exam General: Alert and oriented x 3, NAD Cardiovascular: S1 S2 auscultated, no murmurs, RRR Respiratory: Clear to  auscultation bilaterally, no wheezing, rales or rhonchi Gastrointestinal: Soft, RUQ tenderness.  Ext: no pedal edema bilaterally Neuro: AAOx3, Cr N's II- XII. Strength 5/5 upper and lower extremities bilaterally Skin: No rashes Psych: Normal affect and demeanor, alert and oriented x3    Data Reviewed:  I have personally reviewed following labs and imaging studies   CBC Lab Results  Component Value Date   WBC 6.9 11/21/2021   RBC 4.40 11/21/2021   HGB 13.6 11/21/2021   HCT 39.9 11/21/2021   MCV 90.7 11/21/2021   MCH 30.9 11/21/2021   PLT 239 11/21/2021   MCHC 34.1 11/21/2021   RDW 13.6 11/21/2021   LYMPHSABS 2.2 11/21/2021   MONOABS 0.8 11/21/2021   EOSABS 0.5 11/21/2021   BASOSABS 0.1 123XX123     Last metabolic panel Lab Results  Component Value Date   NA 133 (L) 11/21/2021   K 4.5 11/21/2021   CL 105 11/21/2021   CO2 22 11/21/2021   BUN 39 (H) 11/21/2021   CREATININE 2.12 (H) 11/21/2021   GLUCOSE 322 (H) 11/21/2021   GFRNONAA 39 (L) 11/21/2021   GFRAA >60 04/27/2019   CALCIUM 8.4 (L) 11/21/2021   PHOS 3.4 06/26/2021   PROT 6.5 11/21/2021   ALBUMIN 3.0 (L) 11/21/2021   BILITOT 0.8 11/21/2021   ALKPHOS 55 11/21/2021   AST 17 11/21/2021   ALT 16 11/21/2021   ANIONGAP 6 11/21/2021    CBG (last 3)  Recent Labs    11/20/21 2057 11/21/21 0733 11/21/21 1136  GLUCAP 152* 269* 241*      Coagulation Profile: Recent Labs  Lab 11/20/21 1218  INR 1.0     Radiology Studies: CT Renal Stone Study  Result Date: 11/20/2021 CLINICAL DATA:  Right abdominal pain for 3 weeks. Cholelithiasis. Possible right renal calculi. EXAM: CT ABDOMEN AND PELVIS WITHOUT CONTRAST TECHNIQUE: Multidetector CT imaging of the abdomen and pelvis was performed following the standard protocol without IV contrast. RADIATION DOSE REDUCTION: This exam was performed according to the departmental dose-optimization program which includes automated exposure control, adjustment of the mA  and/or kV according to patient size and/or use of iterative reconstruction technique. COMPARISON:  06/22/2021 FINDINGS: Lower chest: Chronic rounded atelectasis in the right lower lobe  on image 1 series 2 as shown on prior exams including 11/04/2018. Adjacent pleural thickening or peripheral atelectasis. Hepatobiliary: Numerous gallstones identified measuring up to about 1.1 cm in long axis. These are of variable size. No definite stones identified in the common bile duct. No compelling evidence of intrahepatic biliary dilatation or focal hepatic lesion on today's noncontrast exam. Pancreas: Unremarkable Spleen: Unremarkable Adrenals/Urinary Tract: Both adrenal glands appear normal. Symmetric mild bilateral perirenal stranding appears to be chronic. No urinary tract calculi. No hydronephrosis or hydroureter. Urinary bladder unremarkable. Stomach/Bowel: Unremarkable, appendix appears unchanged without inflammatory findings. Vascular/Lymphatic: 1.2 cm left periaortic lymph node on image 45 series 2, formerly the same. 1.1 cm right common iliac lymph node on image 58 series 2, formerly 1.2 cm. Additional borderline prominent retroperitoneal, porta hepatis, and iliac lymph nodes similar to previous. Reproductive: Unremarkable Other: No supplemental non-categorized findings. Musculoskeletal: Short pedicles, facet arthropathy, and mild disc bulges lead to possible mild bilateral foraminal impingement at the L4-5 and L5-S1 levels. IMPRESSION: 1. A specific cause for the patient's right abdominal pain is not identified. 2. Cholelithiasis. 3. Chronic rounded atelectasis in the right lower lobe with adjacent pleural thickening or peripheral atelectasis. 4. Borderline prominent retroperitoneal, porta hepatis, and iliac lymph nodes, similar to prior. 5. Lower lumbar impingement at L4-5 and L5-S1. Electronically Signed   By: Van Clines M.D.   On: 11/20/2021 16:56   US Abdomen Limited RUQ (LIVER/GB)  Result Date:  11/20/2021 CLINICAL DATA:  RIGHT upper quadrant abdominal pain EXAM: ULTRASOUND ABDOMEN LIMITED RIGHT UPPER QUADRANT COMPARISON:  05/12/2020 FINDINGS: Gallbladder: Multiple shadowing calculi within gallbladder up to 8 mm diameter. No gallbladder wall thickening, pericholecystic fluid, or sonographic Murphy sign. Small amount of associated sludge in gallbladder. Common bile duct: Diameter: 3 mm Liver: Slightly echogenic parenchyma, likely fatty infiltration though this can be seen with cirrhosis and certain infiltrative disorders. No definite hepatic mass or nodularity. Portal vein is patent on color Doppler imaging with normal direction of blood flow towards the liver. Other: No RIGHT upper quadrant free fluid. IMPRESSION: Cholelithiasis without evidence of acute cholecystitis or biliary dilatation. Probable fatty infiltration of liver as above. Electronically Signed   By: Lavonia Dana M.D.   On: 11/20/2021 09:55       Hosie Poisson M.D. [facility] 11/21/2021, 11:55 AM  Available via Epic secure chat 7am-7pm After 7 pm, please refer to night coverage provider listed on amion.

## 2021-11-21 NOTE — Inpatient Diabetes Management (Addendum)
Inpatient Diabetes Program Recommendations  AACE/ADA: New Consensus Statement on Inpatient Glycemic Control (2015)  Target Ranges:  Prepandial:   less than 140 mg/dL      Peak postprandial:   less than 180 mg/dL (1-2 hours)      Critically ill patients:  140 - 180 mg/dL    Latest Reference Range & Units 06/09/21 07:47 11/20/21 05:45  Hemoglobin A1C 4.8 - 5.6 % 10.7 (H) 10.5 (H)  254 mg/dl  (H): Data is abnormally high  Latest Reference Range & Units 11/20/21 05:37 11/20/21 20:57 11/21/21 07:33  Glucose-Capillary 70 - 99 mg/dL 142 (H) 152 (H) 269 (H)  (H): Data is abnormally high   Admit with:  Symptomatic Gall stones without acute cholecystitis  Acute mild pancreatitis   History: DM, CKD  Home DM Meds: Humalog 75/25 Insulin 100 units BID        Jardiance 25 mg daily        Ozempic 0.5 mg Qweek  Current Orders: Novolog Moderate Correction Scale/ SSI (0-15 units) TID AC + HS   ENDO: Dr. Posey Pronto with [place] seen 07/24/2021--A1c at that visit was 8.4% Was told to take the following: Increase the dose of Ozempic to 1 mg once a week  Continue with 70/30 insulin at 100 units twice a day Continue with Jardiance    MD- Note Novolog SSI to start this AM  1. Please adjust the timing of the Novolog SSI to Q4 hours since pt currently NPO  2. Please also consider starting basal insulin for pt--CBG 269 this AM--Takes 75/25 pre-mixed insulin at home but this insulin is not ideal for pts who are NPO  Please consider starting Semglee 15 units Daily to start (0.1 units/kg)   Addendum 11:45am--Met w/ pt at bedside prior to pt being taken to OR for surgery.  Pt told me he had some issues with his insurance coverage and was unable to get/nor take his Ozempic for 2 months.  Issues are now resolved and pt has Ozempic at home and plans to re-start when he goes home.  Reviewed last visit with pt's ENDO and pt stated he did increase his dose of Ozempic to 1 mg Qweek after that visit.   Reviewed current A1c of 10.5% with pt--Pt understands what A1c is--Stated frustration as his A1c has risen but this is likely due to not being able to take his Ozempic for 2 months.  Pt told me when he started the Ozempic prior to July that his A1c improved drastically along with CBGs at home.  Has follow up appt with ENDO on 11/27/2021.  Discussed with pt that we are giving him Novolog SSI Q4 hours here in the hospital and would like to give him some basal insulin in-house to get CBGs under control.  Pt told me he does not know what Semglee insulin is and does not want to take it because other basal insulins have caused him to have stomach upset.  I tried to explain that the [facility] should not cause gastric side effects, but explained to pt he is allowed to refuse meds and we will not force him.  Perhaps, when pt resumes PO diet post-op, we can restart home pre-mixed insulin but at much lower dose--pt agreeable to this plan.    --Will follow patient during hospitalization--  Wyn Quaker RN, MSN, [place] Diabetes Coordinator Inpatient Glycemic Control Team Team Pager: [phone] (8a-5p)

## 2021-11-21 NOTE — Anesthesia Postprocedure Evaluation (Signed)
Anesthesia Post Note  Patient: Electrical engineer  Procedure(s) Performed: LAPAROSCOPIC CHOLECYSTECTOMY (Abdomen)     Patient location during evaluation: PACU Anesthesia Type: General Level of consciousness: awake Pain management: pain level controlled Vital Signs Assessment: post-procedure vital signs reviewed and stable Respiratory status: spontaneous breathing, nonlabored ventilation, respiratory function stable and patient connected to nasal cannula oxygen Cardiovascular status: blood pressure returned to baseline and stable Postop Assessment: no apparent nausea or vomiting Anesthetic complications: no   No notable events documented.  Last Vitals:  Vitals:   11/21/21 1700 11/21/21 1757  BP: 137/82 (!) 158/86  Pulse: 89 85  Resp: 19 16  Temp:  (!) 36.3 C  SpO2: 100% 100%    Last Pain:  Vitals:   11/21/21 1757  TempSrc: Oral  PainSc: 10-Worst pain ever                 Trigo Winterbottom P Dustin Burrill

## 2021-11-21 NOTE — Op Note (Signed)
Patient: Barry [name] (1975/06/03, [id number])  Date of Surgery: 11/21/21  Preoperative Diagnosis: Cholelithiasis   Postoperative Diagnosis: Cholelithiasis   Surgical Procedure: LAPAROSCOPIC CHOLECYSTECTOMY: [id number] (CPT)   Operative Team Members:  Surgeon(s) and Role:    * Tyniya Kuyper, Nickola Major, MD - Primary   Anesthesiologist: Murvin Natal, MD; Janeece Riggers, MD CRNA: Niel Hummer, CRNA; Sharlette Dense, CRNA   Anesthesia: General   Fluids:  Total I/O In: 1221.7 [I.V.:1121.7; IV Piggyback:100] Out: 73 [id number]  Complications: None  Drains:  none   Specimen:  ID Type Source Tests Collected by Time Destination  1 : GALLBLADDER GI Gallbladder SURGICAL PATHOLOGY Barry [name], Nickola Major, MD 11/21/2021 1323      Disposition:  PACU - hemodynamically stable.  Plan of Care:  continue inpatient care    Indications for Procedure: Barry [name] is a 46 y.o. male who presented with abdominal pain.  History, physical and imaging was concerning for cholecystitis.  Laparoscopic cholecystectomy was recommended for the patient.  The procedure itself, as well as the risks, benefits and alternatives were discussed with the patient.  Risks discussed included but were not limited [name] the risk of infection, bleeding, damage [name] nearby structures, need [name] convert [name] open procedure, incisional hernia, bile leak, common bile duct injury and the need for additional procedures or surgeries.  With this discussion complete and all questions answered the patient granted consent [name] proceed.  Findings: Gallstones  Infection status: Patient: Barry [name] Emergency General Surgery Service Patient Case: Urgent Infection Present At Time Of Surgery (PATOS):  Some spillage of bile   Description of Procedure:   On the date stated above, the patient was taken [name] the operating room suite and placed in supine positioning.  Sequential compression devices were placed on the lower extremities [name]  prevent blood clots.  General endotracheal anesthesia was induced. Preoperative antibiotics were given.  The patient's abdomen was prepped and draped in the usual sterile fashion.  A time-out was completed verifying the correct patient, procedure, positioning and equipment needed for the case.  A 5 mm incision was made in the right subcostal area.  An optical trocar was inserted in the abdomen the abdomen is insufflated 15 mmHg.  There was an umbilical hernia defect near the previous Incision.  We Decided [name] Use This for the Larger Port.  The 59 Mm Trocar Was Placed in This Position and Two 5 Mm Trocars Were Placed in the Midline 1 Subcostally 1 in the Mid Upper Abdomen.  All trocars were placed under direct vision without any trauma [name] the underlying viscera.  The patient was then placed in head up, left side down positioning.  The gallbladder was identified and dissected free from its attachments [name] the omentum allowing the duodenum [name] fall away.  The infundibulum of the gallbladder was dissected free working laterally [name] medially.  The cystic duct and cystic artery were dissected free from surrounding connective tissue.  The infundibulum of the gallbladder was dissected off the cystic plate.  A critical view of safety was obtained with the cystic duct and cystic artery being cleared of connective tissues and clearly the only two structures entering into the gallbladder with the liver clearly visible behind.  Clips were then applied [name] the cystic duct and cystic artery and then these structures were divided.  The gallbladder was dissected off the cystic plate, placed in an endocatch bag and removed from the 12 mm subxiphoid port site.  The clips were inspected and appeared effective.  The cystic plate was inspected and hemostasis was obtained using electrocautery.  A suction irrigator was used [name] clean the operative field.  Attention was turned [name] closure.  The 12 mm port site was closed using a 0-vicryl  suture on a fascial suture passer.  The abdomen was desufflated.  The skin was closed using 4-0 monocryl and dermabond.  All sponge and needle counts were correct at the conclusion of the case.    Louanna Raw, MD General, Bariatric, & Minimally Invasive Surgery [facility] Surgery, [place]

## 2021-11-21 NOTE — Anesthesia Preprocedure Evaluation (Addendum)
Anesthesia Evaluation  Patient identified by MRN, date of birth, ID band Patient awake    Reviewed: Allergy & Precautions, NPO status , Patient's Chart, lab work & pertinent test results  Airway Mallampati: III  TM Distance: >3 FB Neck ROM: Full    Dental no notable dental hx.    Pulmonary sleep apnea    Pulmonary exam normal        Cardiovascular hypertension, Pt. on medications  Rhythm:Regular Rate:Normal     Neuro/Psych  Headaches   Depression       GI/Hepatic negative GI ROS, Neg liver ROS,,,Cholecystitis    Endo/Other  diabetes, Type 2, Insulin DependentHypothyroidism  Morbid obesity  Renal/GU   negative genitourinary   Musculoskeletal  (+) Arthritis , Osteoarthritis,    Abdominal Normal abdominal exam  (+)   Peds  Hematology negative hematology ROS (+)   Anesthesia Other Findings   Reproductive/Obstetrics                             Anesthesia Physical Anesthesia Plan  ASA: 3  Anesthesia Plan: General   Post-op Pain Management:    Induction: Intravenous  PONV Risk Score and Plan: 2 and Ondansetron, Dexamethasone, Midazolam and Treatment may vary due to age or medical condition  Airway Management Planned: Mask and Oral ETT  Additional Equipment: None  Intra-op Plan:   Post-operative Plan: Extubation in OR  Informed Consent:   Plan Discussed with:   Anesthesia Plan Comments: (Lab Results      Component                Value               Date                      WBC                      6.9                 11/21/2021                HGB                      13.6                11/21/2021                HCT                      39.9                11/21/2021                MCV                      90.7                11/21/2021                PLT                      239                 11/21/2021            Lab Results      Component  Value                Date                      NA                       133 (L)             11/21/2021                K                        4.5                 11/21/2021                CO2                      22                  11/21/2021                GLUCOSE                  322 (H)             11/21/2021                BUN                      39 (H)              11/21/2021                CREATININE               2.12 (H)            11/21/2021                CALCIUM                  8.4 (L)             11/21/2021                GFRNONAA                 39 (L)              11/21/2021           )       Anesthesia Quick Evaluation

## 2021-11-21 NOTE — Anesthesia Procedure Notes (Signed)
Procedure Name: Intubation Date/Time: 11/21/2021 1:37 PM  Performed by: Sharlette Dense, CRNAPatient Re-evaluated:Patient Re-evaluated prior to induction Oxygen Delivery Method: Circle system utilized Preoxygenation: Pre-oxygenation with 100% oxygen Induction Type: IV induction Ventilation: Mask ventilation without difficulty and Oral airway inserted - appropriate to patient size Laryngoscope Size: Brue and 3 Grade View: Grade I Tube type: Oral Tube size: 8.0 mm Number of attempts: 1 Airway Equipment and Method: Stylet Placement Confirmation: ETT inserted through vocal cords under direct vision, positive ETCO2 and breath sounds checked- equal and bilateral Secured at: 23 cm Tube secured with: Tape Dental Injury: Teeth and Oropharynx as per pre-operative assessment

## 2021-11-21 NOTE — Transfer of Care (Signed)
Immediate Anesthesia Transfer of Care Note  Patient: Barry [name]  Procedure(s) Performed: LAPAROSCOPIC CHOLECYSTECTOMY (Abdomen)  Patient Location: PACU  Anesthesia Type:General  Level of Consciousness: drowsy  Airway & Oxygen Therapy: Patient Spontanous Breathing and Patient connected to face mask oxygen  Post-op Assessment: Report given to RN and Post -op Vital signs reviewed and stable  Post vital signs: Reviewed and stable  Last Vitals:  Vitals Value Taken Time  BP 185/105 11/21/21 1516  Temp    Pulse 80 11/21/21 1518  Resp 16 11/21/21 1518  SpO2 100 % 11/21/21 1518  Vitals shown include unvalidated device data.  Last Pain:  Vitals:   11/21/21 1200  TempSrc: Oral  PainSc: 9          Complications: No notable events documented.

## 2021-11-21 NOTE — Progress Notes (Signed)
NP Zebedee Iba was secured chat about a clarification of patient's insulin schedule on the [facility]

## 2021-11-22 ENCOUNTER — Encounter (HOSPITAL_COMMUNITY): Payer: Self-pay | Admitting: Surgery

## 2021-11-22 DIAGNOSIS — K8050 Calculus of bile duct without cholangitis or cholecystitis without obstruction: Secondary | ICD-10-CM | POA: Diagnosis not present

## 2021-11-22 DIAGNOSIS — N179 Acute kidney failure, unspecified: Secondary | ICD-10-CM | POA: Diagnosis not present

## 2021-11-22 DIAGNOSIS — K859 Acute pancreatitis without necrosis or infection, unspecified: Secondary | ICD-10-CM | POA: Diagnosis not present

## 2021-11-22 LAB — GLUCOSE, CAPILLARY
Glucose-Capillary: 179 mg/dL — ABNORMAL HIGH (ref 70–99)
Glucose-Capillary: 181 mg/dL — ABNORMAL HIGH (ref 70–99)
Glucose-Capillary: 186 mg/dL — ABNORMAL HIGH (ref 70–99)
Glucose-Capillary: 188 mg/dL — ABNORMAL HIGH (ref 70–99)
Glucose-Capillary: 296 mg/dL — ABNORMAL HIGH (ref 70–99)
Glucose-Capillary: 310 mg/dL — ABNORMAL HIGH (ref 70–99)

## 2021-11-22 LAB — BASIC METABOLIC PANEL
Anion gap: 8 (ref 5–15)
BUN: 34 mg/dL — ABNORMAL HIGH (ref 6–20)
CO2: 22 mmol/L (ref 22–32)
Calcium: 8 mg/dL — ABNORMAL LOW (ref 8.9–10.3)
Chloride: 104 mmol/L (ref 98–111)
Creatinine, Ser: 2.01 mg/dL — ABNORMAL HIGH (ref 0.61–1.24)
GFR, Estimated: 41 mL/min — ABNORMAL LOW (ref >60)
Glucose, Bld: 335 mg/dL — ABNORMAL HIGH (ref 70–99)
Potassium: 5 mmol/L (ref 3.5–5.1)
Sodium: 134 mmol/L — ABNORMAL LOW (ref 135–145)

## 2021-11-22 LAB — SURGICAL PATHOLOGY

## 2021-11-22 MED ORDER — ACETAMINOPHEN 500 MG PO TABS: 1000.0000 mg | ORAL_TABLET | Freq: Four times a day (QID) | ORAL | 0 refills | Status: DC | PRN

## 2021-11-22 MED ORDER — OXYCODONE HCL 5 MG PO TABS: 5.0000 mg | ORAL_TABLET | ORAL | 0 refills | Status: DC | PRN

## 2021-11-22 MED ORDER — INSULIN ASPART PROT & ASPART (70-30 MIX) 100 UNIT/ML ~~LOC~~ SUSP
50.0000 [IU] | Freq: Two times a day (BID) | SUBCUTANEOUS | Status: DC
  Administered 2021-11-22 – 2021-11-23 (×3): 50 [IU] via SUBCUTANEOUS
  Filled 2021-11-22 (×2): qty 10

## 2021-11-22 MED ORDER — METHOCARBAMOL 500 MG PO TABS
500.0000 mg | ORAL_TABLET | Freq: Three times a day (TID) | ORAL | Status: DC | PRN
  Administered 2021-11-22: 500 mg via ORAL
  Filled 2021-11-22: qty 1

## 2021-11-22 NOTE — Progress Notes (Signed)
Mobility Specialist - Progress Note   11/22/21 1433  Mobility  Activity Ambulated independently in hallway;Ambulated with assistance in hallway  Level of Assistance Independent after set-up  Assistive Device None  Distance Ambulated (ft) 500 ft  Activity Response Tolerated well  Mobility Referral Yes  $Mobility charge 1 Mobility   Pt received in room after using restroom and agreeable to mobility. No complaints during mobility.  Pt to recliner after session with all needs met.    [facility]

## 2021-11-22 NOTE — Progress Notes (Signed)
1 Day Post-Op  Subjective: Pain post op as expected.  Tolerating a regular diet.    ROS: See above, otherwise other systems negative  Objective: Vital signs in last 24 hours: Temp:  [97.4 F (36.3 C)-98.7 F (37.1 C)] 97.7 F (36.5 C) (11/03 0500) Pulse Rate:  [80-90] 80 (11/03 0500) Resp:  [10-19] 17 (11/03 0500) BP: (127-186)/(71-105) 155/91 (11/03 0500) SpO2:  [97 %-100 %] 98 % (11/03 0500) Last BM Date : 11/20/21  Intake/Output from previous day: 11/02 0701 - 11/03 0700 In: 2936.9 [P.O.:540; I.V.:2196.9; IV Piggyback:200] Out: 50 [Blood:50] Intake/Output this shift: No intake/output data recorded.  PE: Abd: soft, obese, tender as expected post op, ND, incisions c/d/i  Lab Results:  Recent Labs    11/20/21 0545 11/21/21 0443  WBC 9.0 6.9  HGB 14.0 13.6  HCT 39.7 39.9  PLT 277 239   BMET Recent Labs    11/20/21 0545 11/21/21 0443  NA 134* 133*  K 3.9 4.5  CL 106 105  CO2 21* 22  GLUCOSE 136* 322*  BUN 48* 39*  CREATININE 2.68* 2.12*  CALCIUM 8.3* 8.4*   PT/INR Recent Labs    11/20/21 1218  LABPROT 12.8  INR 1.0   CMP     Component Value Date/Time   NA 133 (L) 11/21/2021 0443   K 4.5 11/21/2021 0443   CL 105 11/21/2021 0443   CO2 22 11/21/2021 0443   GLUCOSE 322 (H) 11/21/2021 0443   BUN 39 (H) 11/21/2021 0443   CREATININE 2.12 (H) 11/21/2021 0443   CALCIUM 8.4 (L) 11/21/2021 0443   PROT 6.5 11/21/2021 0443   ALBUMIN 3.0 (L) 11/21/2021 0443   AST 17 11/21/2021 0443   ALT 16 11/21/2021 0443   ALKPHOS 55 11/21/2021 0443   BILITOT 0.8 11/21/2021 0443   GFRNONAA 39 (L) 11/21/2021 0443   GFRAA >60 04/27/2019 0820   Lipase     Component Value Date/Time   LIPASE 42 11/21/2021 0443       Studies/Results: CT Renal Stone Study  Result Date: 11/20/2021 CLINICAL DATA:  Right abdominal pain for 3 weeks. Cholelithiasis. Possible right renal calculi. EXAM: CT ABDOMEN AND PELVIS WITHOUT CONTRAST TECHNIQUE: Multidetector CT imaging of  the abdomen and pelvis was performed following the standard protocol without IV contrast. RADIATION DOSE REDUCTION: This exam was performed according to the departmental dose-optimization program which includes automated exposure control, adjustment of the mA and/or kV according to patient size and/or use of iterative reconstruction technique. COMPARISON:  06/22/2021 FINDINGS: Lower chest: Chronic rounded atelectasis in the right lower lobe on image 1 series 2 as shown on prior exams including 11/04/2018. Adjacent pleural thickening or peripheral atelectasis. Hepatobiliary: Numerous gallstones identified measuring up to about 1.1 cm in long axis. These are of variable size. No definite stones identified in the common bile duct. No compelling evidence of intrahepatic biliary dilatation or focal hepatic lesion on today's noncontrast exam. Pancreas: Unremarkable Spleen: Unremarkable Adrenals/Urinary Tract: Both adrenal glands appear normal. Symmetric mild bilateral perirenal stranding appears to be chronic. No urinary tract calculi. No hydronephrosis or hydroureter. Urinary bladder unremarkable. Stomach/Bowel: Unremarkable, appendix appears unchanged without inflammatory findings. Vascular/Lymphatic: 1.2 cm left periaortic lymph node on image 45 series 2, formerly the same. 1.1 cm right common iliac lymph node on image 58 series 2, formerly 1.2 cm. Additional borderline prominent retroperitoneal, porta hepatis, and iliac lymph nodes similar to previous. Reproductive: Unremarkable Other: No supplemental non-categorized findings. Musculoskeletal: Short pedicles, facet arthropathy, and mild disc bulges  lead to possible mild bilateral foraminal impingement at the L4-5 and L5-S1 levels. IMPRESSION: 1. A specific cause for the patient's right abdominal pain is not identified. 2. Cholelithiasis. 3. Chronic rounded atelectasis in the right lower lobe with adjacent pleural thickening or peripheral atelectasis. 4. Borderline  prominent retroperitoneal, porta hepatis, and iliac lymph nodes, similar to prior. 5. Lower lumbar impingement at L4-5 and L5-S1. Electronically Signed   By: Van Clines M.D.   On: 11/20/2021 16:56   US Abdomen Limited RUQ (LIVER/GB)  Result Date: 11/20/2021 CLINICAL DATA:  RIGHT upper quadrant abdominal pain EXAM: ULTRASOUND ABDOMEN LIMITED RIGHT UPPER QUADRANT COMPARISON:  05/12/2020 FINDINGS: Gallbladder: Multiple shadowing calculi within gallbladder up to 8 mm diameter. No gallbladder wall thickening, pericholecystic fluid, or sonographic Murphy sign. Small amount of associated sludge in gallbladder. Common bile duct: Diameter: 3 mm Liver: Slightly echogenic parenchyma, likely fatty infiltration though this can be seen with cirrhosis and certain infiltrative disorders. No definite hepatic mass or nodularity. Portal vein is patent on color Doppler imaging with normal direction of blood flow towards the liver. Other: No RIGHT upper quadrant free fluid. IMPRESSION: Cholelithiasis without evidence of acute cholecystitis or biliary dilatation. Probable fatty infiltration of liver as above. Electronically Signed   By: Lavonia Dana M.D.   On: 11/20/2021 09:55    Anti-infectives: Anti-infectives (From admission, onward)    Start     Dose/Rate Route Frequency Ordered Stop   11/21/21 1100  cefTRIAXone (ROCEPHIN) 2 g in sodium chloride 0.9 % 100 mL IVPB        2 g 200 mL/hr over 30 Minutes Intravenous On call to O.R. 11/21/21 0736 11/21/21 1353   11/21/21 0815  cefTRIAXone (ROCEPHIN) 3 g in dextrose 5 % 50 mL IVPB  Status:  Discontinued        3 g 100 mL/hr over 30 Minutes Intravenous On call to O.R. 11/21/21 0717 11/21/21 0736        Assessment/Plan POD 1, s/p lap chole by Dr. Thermon Leyland for Biliary colic, 85/4 -patient is surgically stable for DC home from a surgical standpoint -tolerating a solid diet -pain is as expected post op.  On oxy prn, scheduled tylenol.  Add some prn  robaxin. -patient needing to drive himself.  Discussed he can not have pain meds for 4-6 hrs prior to driving -follow up and DC instructions provided in AVS  FEN - carb mod diet VTE - lovenox ID - Rocephin on call, no further needed  DM Obesity hypothyroidism   LOS: 1 day    [name] , [facility] 11/22/2021, 8:41 AM Please see Amion for pager number during day hours 7:00am-4:30pm or 7:00am -11:30am on weekends

## 2021-11-22 NOTE — Discharge Instructions (Signed)
[facility]  Please arrive at least 30 min before your appointment to complete your check in paperwork.  If you are unable to arrive 30 min prior to your appointment time we may have to cancel or reschedule you. LAPAROSCOPIC SURGERY: POST OP INSTRUCTIONS Always review your discharge instruction sheet given to you by the facility where your surgery was performed. IF YOU HAVE DISABILITY OR FAMILY LEAVE FORMS, YOU MUST BRING THEM TO THE OFFICE FOR PROCESSING.   DO NOT GIVE THEM TO YOUR DOCTOR.  PAIN CONTROL  First take acetaminophen (Tylenol) AND/or ibuprofen (Advil) to control your pain after surgery.  Follow directions on package.  Taking acetaminophen (Tylenol) and/or ibuprofen (Advil) regularly after surgery will help to control your pain and lower the amount of prescription pain medication you may need.  You should not take more than 4,000 mg (4 grams) of acetaminophen (Tylenol) in 24 hours.  You should not take ibuprofen (Advil), aleve, motrin, naprosyn or other NSAIDS if you have a history of stomach ulcers or chronic kidney disease.  A prescription for pain medication may be given to you upon discharge.  Take your pain medication as prescribed, if you still have uncontrolled pain after taking acetaminophen (Tylenol) or ibuprofen (Advil). Use ice packs to help control pain. If you need a refill on your pain medication, please contact your pharmacy.  They will contact our office to request authorization. Prescriptions will not be filled after 5pm or on week-ends.  HOME MEDICATIONS Take your usually prescribed medications unless otherwise directed.  DIET You should follow a light diet the first few days after arrival home.  Be sure to include lots of fluids daily. Avoid fatty, fried foods.   CONSTIPATION It is common to experience some constipation after surgery and if you are taking pain medication.  Increasing fluid intake and taking a stool softener (such as Colace)  will usually help or prevent this problem from occurring.  A mild laxative (Milk of Magnesia or Miralax) should be taken according to package instructions if there are no bowel movements after 48 hours.  WOUND/INCISION CARE Most patients will experience some swelling and bruising in the area of the incisions.  Ice packs will help.  Swelling and bruising can take several days to resolve.  Unless discharge instructions indicate otherwise, follow guidelines below  STERI-STRIPS - you may remove your outer bandages 48 hours after surgery, and you may shower at that time.  You have steri-strips (small skin tapes) in place directly over the incision.  These strips should be left on the skin for 7-10 days.   DERMABOND/SKIN GLUE - you may shower in 24 hours.  The glue will flake off over the next 2-3 weeks. Any sutures or staples will be removed at the office during your follow-up visit.  ACTIVITIES You may resume regular (light) daily activities beginning the next day--such as daily self-care, walking, climbing stairs--gradually increasing activities as tolerated.  You may have sexual intercourse when it is comfortable.  Refrain from any heavy lifting or straining until approved by your doctor. You may drive when you are no longer taking prescription pain medication, you can comfortably wear a seatbelt, and you can safely maneuver your car and apply brakes.  FOLLOW-UP You should see your doctor in the office for a follow-up appointment approximately 2-3 weeks after your surgery.  You should have been given your post-op/follow-up appointment when your surgery was scheduled.  If you did not receive a post-op/follow-up appointment, make sure   that you call for this appointment within a day or two after you arrive home to insure a convenient appointment time.   WHEN TO CALL YOUR DOCTOR: Fever over 101.0 Inability to urinate Continued bleeding from incision. Increased pain, redness, or drainage from the  incision. Increasing abdominal pain  The clinic staff is available to answer your questions during regular business hours.  Please don't hesitate to call and ask to speak to one of the nurses for clinical concerns.  If you have a medical emergency, go to the nearest emergency room or call 911.  A surgeon from [facility] is always on call at the hospital. [address], [address], [place], [place]  [address] ? [address] [address], [place], [place]   [address] [phone] ? [phone] ? FAX [phone]  

## 2021-11-22 NOTE — Progress Notes (Signed)
[facility]                                                                               Neko Boyajian, is a 46 y.o. male, DOB - Nov 15, 1975, ELF:[id number] Admit date - 11/20/2021    Outpatient Primary MD for the patient is Maryella Shivers, MD  LOS - 1  days    Brief summary   Barry [name] is a 46 y.o. male with medical history significant of DM insulin dependent, Gout, hyperlipidemia,  CKD, OSA, obesity, hypothyroidism, reports upper abdominal pain since 3 weeks. He was found to have cholelithiasis. He reports seeing a Psychologist, sport and exercise in [place] and an attempt to remove the GB , but unable to do so due to enlarged liver. He presents to today with nausea, persistent abdominal pain . CT renal study was done , showing cholelithiasis.  US abdomen does not show any acute cholecystitis. General surgery consulted and he was referred to [facility] for admission.    Assessment & Plan      Assessment and Plan:  Persistent abdominal pain since 3 weeks possibly from Biliary colic Pain control.  Appreciate Surgery recommendations.  Pt underwent lap cholecystectomy on 11/21/2021.  He was started on a diet and able to tolerate it .    Insulin dependent DM;  CBG (last 3)  Recent Labs    11/22/21 0526 11/22/21 0742 11/22/21 1123  GLUCAP 186* 188* 310*    A1c is 10.5% Started him on 50 units of NOVOLOG 70/30 , and SSI.  Add novolog 5 units tidac.    Acute kidney injury: on stage 3a CKD Baseline creatinine appears to be between 1.6 to 1.9.  Creatinine improved to  to 2 from 2.12 from 2.68.  Recommend to Continue with IV fluids for another 24 hours. Recheck renal parameters in am.     Body mass index is 43.75 kg/m. Morbid obesity.    Hypertension:  BP parameters are optimal.    Mild acute pancreatitis:  - lipase levels normalized. Some abd pain / soreness.   Mild hyponatremia:  Sodium of 134.     Estimated body mass index is 43.75 kg/m as calculated from  the following:   Height as of this encounter: 6\' 3"  (1.905 m).   Weight as of this encounter: 158.8 kg.  Code Status: full code.  DVT Prophylaxis:  lovenox.    Level of Care: Level of care: Med-Surg Family Communication: none at bedside.   Disposition Plan:     Remains inpatient appropriate:  lap chole today.   Procedures:  LAP cholecystectomy.   Consultants:   General surgery.   Antimicrobials:   Anti-infectives (From admission, onward)    Start     Dose/Rate Route Frequency Ordered Stop   11/21/21 1100  cefTRIAXone (ROCEPHIN) 2 g in sodium chloride 0.9 % 100 mL IVPB        2 g 200 mL/hr over 30 Minutes Intravenous On call to O.R. 11/21/21 0736 11/21/21 1353   11/21/21 0815  cefTRIAXone (ROCEPHIN) 3 g in dextrose 5 % 50 mL IVPB  Status:  Discontinued        3 g 100 mL/hr over 30 Minutes Intravenous  On call to O.R. 11/21/21 0717 11/21/21 0736        Medications  Scheduled Meds:  acetaminophen  1,000 mg Oral Q6H   atorvastatin  80 mg Oral Daily   DULoxetine  30 mg Oral Daily   enoxaparin (LOVENOX) injection  80 mg Subcutaneous Q24H   influenza vac split quadrivalent PF  0.5 mL Intramuscular Tomorrow-1000   insulin aspart  0-15 Units Subcutaneous Q4H   insulin aspart  0-5 Units Subcutaneous QHS   insulin aspart protamine- aspart  50 Units Subcutaneous BID WC   Continuous Infusions:  sodium chloride 75 mL/hr at 11/22/21 0533   PRN Meds:.hydrALAZINE, methocarbamol, morphine injection, oxyCODONE    Subjective:   Vartan Kerins was seen and examined today.  Still having some soreness in ethe abdomen, no nausea or vomiting.   Objective:   Vitals:   11/21/21 2113 11/22/21 0055 11/22/21 0500 11/22/21 1125  BP: (!) 159/91 127/83 (!) 155/91 125/80  Pulse: 90 90 80 95  Resp: 17 17 17 17   Temp: 98.7 F (37.1 C) 97.8 F (36.6 C) 97.7 F (36.5 C) 98.5 F (36.9 C)  TempSrc:    Oral  SpO2: 100% 99% 98% 98%  Weight:      Height:        Intake/Output Summary  (Last 24 hours) at 11/22/2021 1234 Last data filed at 11/22/2021 0600 Gross per 24 hour  Intake 2715.22 ml  Output 50 ml  Net 2665.22 ml    Filed Weights   11/20/21 0542  Weight: (!) 158.8 kg     Exam General exam: Appears calm and comfortable  Respiratory system: Clear to auscultation. Respiratory effort normal. Cardiovascular system: S1 & S2 heard, RRR. No JVD, No pedal edema. Gastrointestinal system: Abdomen is soft, mild tenderness at the site of the incision.   Normal bowel sounds heard. Central nervous system: Alert and oriented. No focal neurological deficits. Extremities: Symmetric 5 x 5 power. Skin: No rashes, lesions or ulcers Psychiatry: Mood & affect appropriate.    Data Reviewed:  I have personally reviewed following labs and imaging studies   CBC Lab Results  Component Value Date   WBC 6.9 11/21/2021   RBC 4.40 11/21/2021   HGB 13.6 11/21/2021   HCT 39.9 11/21/2021   MCV 90.7 11/21/2021   MCH 30.9 11/21/2021   PLT 239 11/21/2021   MCHC 34.1 11/21/2021   RDW 13.6 11/21/2021   LYMPHSABS 2.2 11/21/2021   MONOABS 0.8 11/21/2021   EOSABS 0.5 11/21/2021   BASOSABS 0.1 11/21/2021     Last metabolic panel Lab Results  Component Value Date   NA 134 (L) 11/22/2021   K 5.0 11/22/2021   CL 104 11/22/2021   CO2 22 11/22/2021   BUN 34 (H) 11/22/2021   CREATININE 2.01 (H) 11/22/2021   GLUCOSE 335 (H) 11/22/2021   GFRNONAA 41 (L) 11/22/2021   GFRAA >60 04/27/2019   CALCIUM 8.0 (L) 11/22/2021   PHOS 3.4 06/26/2021   PROT 6.5 11/21/2021   ALBUMIN 3.0 (L) 11/21/2021   BILITOT 0.8 11/21/2021   ALKPHOS 55 11/21/2021   AST 17 11/21/2021   ALT 16 11/21/2021   ANIONGAP 8 11/22/2021    CBG (last 3)  Recent Labs    11/22/21 0526 11/22/21 0742 11/22/21 1123  GLUCAP 186* 188* 310*       Coagulation Profile: Recent Labs  Lab 11/20/21 1218  INR 1.0      Radiology Studies: CT Renal Stone Study  Result Date: 11/20/2021 CLINICAL DATA:  Right  abdominal pain for 3 weeks. Cholelithiasis. Possible right renal calculi. EXAM: CT ABDOMEN AND PELVIS WITHOUT CONTRAST TECHNIQUE: Multidetector CT imaging of the abdomen and pelvis was performed following the standard protocol without IV contrast. RADIATION DOSE REDUCTION: This exam was performed according to the departmental dose-optimization program which includes automated exposure control, adjustment of the mA and/or kV according to patient size and/or use of iterative reconstruction technique. COMPARISON:  06/22/2021 FINDINGS: Lower chest: Chronic rounded atelectasis in the right lower lobe on image 1 series 2 as shown on prior exams including 11/04/2018. Adjacent pleural thickening or peripheral atelectasis. Hepatobiliary: Numerous gallstones identified measuring up to about 1.1 cm in long axis. These are of variable size. No definite stones identified in the common bile duct. No compelling evidence of intrahepatic biliary dilatation or focal hepatic lesion on today's noncontrast exam. Pancreas: Unremarkable Spleen: Unremarkable Adrenals/Urinary Tract: Both adrenal glands appear normal. Symmetric mild bilateral perirenal stranding appears to be chronic. No urinary tract calculi. No hydronephrosis or hydroureter. Urinary bladder unremarkable. Stomach/Bowel: Unremarkable, appendix appears unchanged without inflammatory findings. Vascular/Lymphatic: 1.2 cm left periaortic lymph node on image 45 series 2, formerly the same. 1.1 cm right common iliac lymph node on image 58 series 2, formerly 1.2 cm. Additional borderline prominent retroperitoneal, porta hepatis, and iliac lymph nodes similar to previous. Reproductive: Unremarkable Other: No supplemental non-categorized findings. Musculoskeletal: Short pedicles, facet arthropathy, and mild disc bulges lead to possible mild bilateral foraminal impingement at the L4-5 and L5-S1 levels. IMPRESSION: 1. A specific cause for the patient's right abdominal pain is not  identified. 2. Cholelithiasis. 3. Chronic rounded atelectasis in the right lower lobe with adjacent pleural thickening or peripheral atelectasis. 4. Borderline prominent retroperitoneal, porta hepatis, and iliac lymph nodes, similar to prior. 5. Lower lumbar impingement at L4-5 and L5-S1. Electronically Signed   By: Gaylyn Rong M.D.   On: 11/20/2021 16:56       Kathlen Mody M.D. [facility] 11/22/2021, 12:34 PM  Available via Epic secure chat 7am-7pm After 7 pm, please refer to night coverage provider listed on amion.

## 2021-11-22 NOTE — Progress Notes (Signed)
Mobility Specialist - Progress Note   11/22/21 1100  Mobility  Activity Ambulated with assistance in hallway  Level of Assistance Independent after set-up  Assistive Device None  Distance Ambulated (ft) 500 ft  Range of Motion/Exercises Active  Activity Response Tolerated well  Mobility Referral Yes  $Mobility charge 1 Mobility   Pt received in bed and agreeable to mobility. C/o soreness from incisions on abdomen. Pt to bed after session with all needs met.    [facility]

## 2021-11-22 NOTE — TOC Initial Note (Signed)
Transition of Care [facility]) - Initial/Assessment Note    Patient Details  Name: Barry [name] MRN: [id number] Date of Birth: 11-26-1975  Transition of Care [facility]) CM/SW Contact:    Vassie Moselle, LCSW Phone Number: 11/22/2021, 10:51 AM  Clinical Narrative:                 [facility] consulted for SDOH concerns. Pt offered limited engagement in conversation and provided limited details to his social situation and concerns. Pt shares that he does run out of food at times. Pt shares he has applied for SNAP benefits in the past but, his financial situation has changed. Pt was encouraged to reapply for benefits.  Pt shares that he is able to stay on top of all of his bills and his main concern with housing is wanting a "Bigger place."   Pt denies needing additional resources.   Expected Discharge Plan: Home/Self Care Barriers to Discharge: No Barriers Identified   Patient Goals and CMS Choice Patient states their goals for this hospitalization and ongoing recovery are:: To return home CMS Medicare.gov Compare Post Acute Care list provided to:: Patient Choice offered to / list presented to : Patient  Expected Discharge Plan and Services Expected Discharge Plan: Home/Self Care In-house Referral: NA Discharge Planning Services: CM Consult Post Acute Care Choice: NA Living arrangements for the past 2 months: Apartment                 DME Arranged: N/A DME Agency: NA                  Prior Living Arrangements/Services Living arrangements for the past 2 months: Apartment Lives with:: Self Patient language and need for interpreter reviewed:: Yes Do you feel safe going back to the place where you live?: Yes      Need for Family Participation in Patient Care: No (Comment) Care giver support system in place?: No (comment)   Criminal Activity/Legal Involvement Pertinent to Current Situation/Hospitalization: No - Comment as needed  Activities of Daily Living Home Assistive  Devices/Equipment: None ADL Screening (condition at time of admission) Patient's cognitive ability adequate to safely complete daily activities?: Yes Is the patient deaf or have difficulty hearing?: No Does the patient have difficulty seeing, even when wearing glasses/contacts?: Yes Does the patient have difficulty concentrating, remembering, or making decisions?: No Patient able to express need for assistance with ADLs?: Yes Does the patient have difficulty dressing or bathing?: No Independently performs ADLs?: Yes (appropriate for developmental age) Does the patient have difficulty walking or climbing stairs?: No Weakness of Legs: None Weakness of Arms/Hands: None  Permission Sought/Granted   Permission granted to share information with : No              Emotional Assessment Appearance:: Appears stated age Attitude/Demeanor/Rapport: Avoidant, Guarded Affect (typically observed): Calm, Guarded Orientation: : Oriented to Self, Oriented to Place, Oriented to  Time, Oriented to Situation Alcohol / Substance Use: Not Applicable Psych Involvement: No (comment)  Admission diagnosis:  Acute pancreatitis [K85.90] Serum lipase elevation [R74.8] AKI (acute kidney injury) ([place]) [N17.9] Left upper quadrant abdominal pain [H06.23] Biliary colic [J62.83] Patient Active Problem List   Diagnosis Date Noted   Biliary colic 15/17/6160   Acute pancreatitis 11/20/2021   Cellulitis of left lower extremity 06/22/2021   Sepsis ([place]) 06/22/2021   Gout flare 06/22/2021   Acute osteomyelitis of left foot ([place]) 06/10/2021   Leukocytosis 06/10/2021   Diabetic ulcer of left foot associated with type 2  diabetes mellitus (HCC) 06/09/2021   Class 3 obesity (HCC) 06/09/2021   Type 2 diabetes mellitus with hyperglycemia (HCC) 06/09/2021   Acute kidney injury superimposed on chronic kidney disease (HCC) 02/14/2021   Atypical chest pain 02/14/2021   Cellulitis and abscess of foot 02/14/2021    Cholelithiasis 02/14/2021   Chronic cholecystitis with calculus 02/14/2021   Dependent edema 02/14/2021   DKA (diabetic ketoacidosis) (HCC) 02/14/2021   Failure of outpatient treatment 02/14/2021   Fracture of right great toe 02/14/2021   Hyponatremia 02/14/2021   Noncompliance with medication regimen 02/14/2021   Knee effusion, left 02/14/2021   Pneumonia 02/14/2021   Retrograde ejaculation 12/12/2019   AKI (acute kidney injury) (HCC) 08/05/2018   Stage 3b chronic kidney disease (CKD) (HCC) 08/05/2018   Hyperosmolar non-ketotic state in patient with type 2 diabetes mellitus (HCC) 08/04/2018   Pneumonia due to COVID-19 virus 08/04/2018   Pain in joint of left elbow 07/02/2018   Pain in left knee 07/02/2018   Pain of left hand 03/20/2018   Thoracic radiculopathy 05/01/2015   Chronic allergic conjunctivitis 04/20/2015   Congenital cataract 04/20/2015   Diabetic cataract (HCC) 04/20/2015   Dry eyes, bilateral 04/20/2015   Myopia of both eyes 04/20/2015   Paronychia 04/20/2015   Carpal tunnel syndrome, left 03/29/2015   Dysuria 03/21/2015   Arm numbness left 03/20/2015   Diabetic neuropathy (HCC) 03/20/2015   Neck pain 03/20/2015   Hypogonadism male 03/19/2015   Morbid obesity with BMI of 40.0-44.9, adult (HCC) 03/19/2015   Burning sensation of feet 03/16/2015   Common migraine 03/16/2015   Degenerative disc disease, lumbar 03/16/2015   Depression 03/16/2015   Hypertension 03/16/2015   Low back pain 03/16/2015   OSA (obstructive sleep apnea) 03/16/2015   DM2 (diabetes mellitus, type 2) (HCC) 03/16/2015   Hypothyroidism 05/31/2008   HLD (hyperlipidemia) 05/31/2008   OBESITY, MORBID 05/31/2008   MYOCARDIAL PERFUSION SCAN, WITH STRESS TEST, ABNORMAL 05/31/2008   Dyslipidemia 05/31/2008   PCP:  Charlott Rakes, MD Pharmacy:   [facility] [address] [place] - [address] [place] [address] Phone: [phone] Fax:  [phone]  [id number] - [place], [place] - [address] AT [facility] [address] [place] [address] Phone: [phone] Fax: [phone]     Social Determinants of Health (SDOH) Interventions Food Insecurity Interventions: Patient Refused Utilities Interventions: Patient Refused  Readmission Risk Interventions    11/22/2021   10:49 AM 06/24/2021   10:48 AM  Readmission Risk Prevention Plan  Transportation Screening Complete Complete  PCP or Specialist Appt within 3-5 Days Complete Complete  HRI or Home Care Consult Complete Complete  Social Work Consult for Recovery Care Planning/Counseling Complete Complete  Palliative Care Screening Not Applicable Complete  Medication Review Oceanographer) Complete Complete

## 2021-11-23 DIAGNOSIS — N179 Acute kidney failure, unspecified: Secondary | ICD-10-CM | POA: Diagnosis not present

## 2021-11-23 DIAGNOSIS — K8050 Calculus of bile duct without cholangitis or cholecystitis without obstruction: Secondary | ICD-10-CM | POA: Diagnosis not present

## 2021-11-23 DIAGNOSIS — K859 Acute pancreatitis without necrosis or infection, unspecified: Secondary | ICD-10-CM | POA: Diagnosis not present

## 2021-11-23 LAB — BASIC METABOLIC PANEL
Anion gap: 5 (ref 5–15)
BUN: 30 mg/dL — ABNORMAL HIGH (ref 6–20)
CO2: 23 mmol/L (ref 22–32)
Calcium: 8 mg/dL — ABNORMAL LOW (ref 8.9–10.3)
Chloride: 105 mmol/L (ref 98–111)
Creatinine, Ser: 1.95 mg/dL — ABNORMAL HIGH (ref 0.61–1.24)
GFR, Estimated: 43 mL/min — ABNORMAL LOW (ref >60)
Glucose, Bld: 165 mg/dL — ABNORMAL HIGH (ref 70–99)
Potassium: 4.2 mmol/L (ref 3.5–5.1)
Sodium: 133 mmol/L — ABNORMAL LOW (ref 135–145)

## 2021-11-23 LAB — GLUCOSE, CAPILLARY
Glucose-Capillary: 244 mg/dL — ABNORMAL HIGH (ref 70–99)
Glucose-Capillary: 157 mg/dL — ABNORMAL HIGH (ref 70–99)
Glucose-Capillary: 170 mg/dL — ABNORMAL HIGH (ref 70–99)

## 2021-11-23 MED ORDER — ATORVASTATIN CALCIUM 80 MG PO TABS: 80.0000 mg | ORAL_TABLET | Freq: Every day | ORAL | 1 refills | Status: AC

## 2021-11-23 MED ORDER — ALLOPURINOL 300 MG PO TABS: 300.0000 mg | ORAL_TABLET | Freq: Every day | ORAL | 1 refills | Status: AC

## 2021-11-23 MED ORDER — ASPIRIN 81 MG PO CHEW: 81.0000 mg | CHEWABLE_TABLET | Freq: Every day | ORAL | 1 refills | Status: DC

## 2021-11-23 MED ORDER — OZEMPIC (0.25 OR 0.5 MG/DOSE) 2 MG/3ML ~~LOC~~ SOPN: 0.5000 mg | PEN_INJECTOR | SUBCUTANEOUS | 2 refills | Status: DC

## 2021-11-23 MED ORDER — AMLODIPINE BESYLATE 10 MG PO TABS: 10.0000 mg | ORAL_TABLET | Freq: Every day | ORAL | 2 refills | Status: DC

## 2021-11-23 NOTE — Plan of Care (Signed)
Problem: Education: Goal: Ability to describe self-care measures that may prevent or decrease complications (Diabetes Survival Skills Education) will improve 11/23/2021 1320 by Tanda Rockers, RN Outcome: Adequate for Discharge 11/23/2021 1320 by Tanda Rockers, RN Outcome: Progressing Goal: Individualized Educational Video(s) 11/23/2021 1320 by Tanda Rockers, RN Outcome: Adequate for Discharge 11/23/2021 1320 by Tanda Rockers, RN Outcome: Progressing   Problem: Coping: Goal: Ability to adjust to condition or change in health will improve 11/23/2021 1320 by Tanda Rockers, RN Outcome: Adequate for Discharge 11/23/2021 1320 by Tanda Rockers, RN Outcome: Progressing   Problem: Fluid Volume: Goal: Ability to maintain a balanced intake and output will improve 11/23/2021 1320 by Tanda Rockers, RN Outcome: Adequate for Discharge 11/23/2021 1320 by Tanda Rockers, RN Outcome: Progressing   Problem: Health Behavior/Discharge Planning: Goal: Ability to identify and utilize available resources and services will improve 11/23/2021 1320 by Tanda Rockers, RN Outcome: Adequate for Discharge 11/23/2021 1320 by Tanda Rockers, RN Outcome: Progressing Goal: Ability to manage health-related needs will improve 11/23/2021 1320 by Tanda Rockers, RN Outcome: Adequate for Discharge 11/23/2021 1320 by Tanda Rockers, RN Outcome: Progressing   Problem: Metabolic: Goal: Ability to maintain appropriate glucose levels will improve 11/23/2021 1320 by Tanda Rockers, RN Outcome: Adequate for Discharge 11/23/2021 1320 by Tanda Rockers, RN Outcome: Progressing   Problem: Nutritional: Goal: Maintenance of adequate nutrition will improve 11/23/2021 1320 by Tanda Rockers, RN Outcome: Adequate for Discharge 11/23/2021 1320 by Tanda Rockers, RN Outcome: Progressing Goal: Progress toward achieving an optimal weight will improve 11/23/2021 1320  by Tanda Rockers, RN Outcome: Adequate for Discharge 11/23/2021 1320 by Tanda Rockers, RN Outcome: Progressing   Problem: Skin Integrity: Goal: Risk for impaired skin integrity will decrease 11/23/2021 1320 by Tanda Rockers, RN Outcome: Adequate for Discharge 11/23/2021 1320 by Tanda Rockers, RN Outcome: Progressing   Problem: Tissue Perfusion: Goal: Adequacy of tissue perfusion will improve 11/23/2021 1320 by Tanda Rockers, RN Outcome: Adequate for Discharge 11/23/2021 1320 by Tanda Rockers, RN Outcome: Progressing   Problem: Education: Goal: Knowledge of General Education information will improve Description: Including pain rating scale, medication(s)/side effects and non-pharmacologic comfort measures 11/23/2021 1320 by Tanda Rockers, RN Outcome: Adequate for Discharge 11/23/2021 1320 by Tanda Rockers, RN Outcome: Progressing   Problem: Health Behavior/Discharge Planning: Goal: Ability to manage health-related needs will improve 11/23/2021 1320 by Tanda Rockers, RN Outcome: Adequate for Discharge 11/23/2021 1320 by Tanda Rockers, RN Outcome: Progressing   Problem: Clinical Measurements: Goal: Ability to maintain clinical measurements within normal limits will improve 11/23/2021 1320 by Tanda Rockers, RN Outcome: Adequate for Discharge 11/23/2021 1320 by Tanda Rockers, RN Outcome: Progressing Goal: Will remain free from infection 11/23/2021 1320 by Tanda Rockers, RN Outcome: Adequate for Discharge 11/23/2021 1320 by Tanda Rockers, RN Outcome: Progressing Goal: Diagnostic test results will improve 11/23/2021 1320 by Tanda Rockers, RN Outcome: Adequate for Discharge 11/23/2021 1320 by Tanda Rockers, RN Outcome: Progressing Goal: Respiratory complications will improve 11/23/2021 1320 by Tanda Rockers, RN Outcome: Adequate for Discharge 11/23/2021 1320 by Tanda Rockers, RN Outcome:  Progressing Goal: Cardiovascular complication will be avoided 11/23/2021 1320 by Tanda Rockers, RN Outcome: Adequate for Discharge 11/23/2021 1320 by Tanda Rockers, RN Outcome: Progressing   Problem: Activity: Goal: Risk for activity intolerance will decrease 11/23/2021 1320 by Tanda Rockers, RN Outcome: Adequate for Discharge  11/23/2021 1320 by Sherian Maroon, RN Outcome: Progressing   Problem: Nutrition: Goal: Adequate nutrition will be maintained 11/23/2021 1320 by Sherian Maroon, RN Outcome: Adequate for Discharge 11/23/2021 1320 by Sherian Maroon, RN Outcome: Progressing   Problem: Coping: Goal: Level of anxiety will decrease 11/23/2021 1320 by Sherian Maroon, RN Outcome: Adequate for Discharge 11/23/2021 1320 by Sherian Maroon, RN Outcome: Progressing   Problem: Elimination: Goal: Will not experience complications related to bowel motility 11/23/2021 1320 by Sherian Maroon, RN Outcome: Adequate for Discharge 11/23/2021 1320 by Sherian Maroon, RN Outcome: Progressing Goal: Will not experience complications related to urinary retention 11/23/2021 1320 by Sherian Maroon, RN Outcome: Adequate for Discharge 11/23/2021 1320 by Sherian Maroon, RN Outcome: Progressing   Problem: Pain Managment: Goal: General experience of comfort will improve 11/23/2021 1320 by Sherian Maroon, RN Outcome: Adequate for Discharge 11/23/2021 1320 by Sherian Maroon, RN Outcome: Progressing   Problem: Safety: Goal: Ability to remain free from injury will improve 11/23/2021 1320 by Sherian Maroon, RN Outcome: Adequate for Discharge 11/23/2021 1320 by Sherian Maroon, RN Outcome: Progressing   Problem: Skin Integrity: Goal: Risk for impaired skin integrity will decrease 11/23/2021 1320 by Sherian Maroon, RN Outcome: Adequate for Discharge 11/23/2021 1320 by Sherian Maroon, RN Outcome: Progressing

## 2021-11-23 NOTE — Progress Notes (Signed)
Assessment unchanged. Pt verblized understanding of dc instructions including medications and follow up care. Discharged via foot with Mother and NT.

## 2021-11-24 NOTE — Discharge Summary (Signed)
Physician Discharge Summary   Patient: Barry [name] MRN: [id number] DOB: Mar 31, 1975  Admit date:     11/20/2021  Discharge date: 11/23/2021  Discharge Physician: Hosie Poisson   PCP: Maryella Shivers, MD   Recommendations at discharge:  Please follow up with PCP in one week.  Please check cbc and bmp in one week.  Please follow up with gen surgery as recommended.   Discharge Diagnoses: Principal Problem:   Acute pancreatitis Active Problems:   Biliary colic    Hospital Course: Barry [name] is a 46 y.o. male with medical history significant of DM insulin dependent, Gout, hyperlipidemia,  CKD, OSA, obesity, hypothyroidism, reports upper abdominal pain since 3 weeks. He was found to have cholelithiasis. He reports seeing a Psychologist, sport and exercise in [place] and an attempt to remove the GB , but unable to do so due to enlarged liver. He presents to today with nausea, persistent abdominal pain . CT renal study was done , showing cholelithiasis.  US abdomen does not show any acute cholecystitis. General surgery consulted and he was referred to [facility] for admission.     Assessment and Plan:   Persistent abdominal pain since 3 weeks possibly from Biliary colic Pain control.  Appreciate Surgery recommendations.  Pt underwent lap cholecystectomy on 11/21/2021.  He was started on a diet and able to tolerate it .       Insulin dependent DM;  CBG (last 3)  Uncontrolled with hyperglycemia.  Resume home meds on discharge.      Acute kidney injury: on stage 3a CKD Baseline creatinine appears to be between 1.6 to 1.9.  Creatinine improved to  to 1.9  from 2.12 from 2.68.  Recommend to hold off on lasix and ARB for a week, recheck renal parameters and restart the meds as per PCp.        Body mass index is 43.75 kg/m. Morbid obesity.      Hypertension:  BP parameters are optimal.      Mild acute pancreatitis:  - lipase levels normalized. Some abd pain / soreness.    Mild hyponatremia:   Sodium of 134.    Estimated body mass index is 43.75 kg/m as calculated from the following:   Height as of this encounter: 6' 3" (1.905 m).   Weight as of this encounter: 158.8 kg.         Consultants: gen surgery.  Procedures performed: Lap chole Disposition: Home Diet recommendation:  Discharge Diet Orders (From admission, onward)     Start     Ordered   11/23/21 0000  Diet - low sodium heart healthy        11/23/21 1257           Regular diet DISCHARGE MEDICATION: Allergies as of 11/23/2021   No Known Allergies      Medication List     STOP taking these medications    acetaminophen 650 MG CR tablet Commonly known as: TYLENOL Replaced by: acetaminophen 500 MG tablet   amoxicillin-clavulanate 875-125 MG tablet Commonly known as: AUGMENTIN   fluticasone 50 MCG/ACT nasal spray Commonly known as: FLONASE   furosemide 20 MG tablet Commonly known as: LASIX   gentamicin ointment 0.1 % Commonly known as: GARAMYCIN   HYDROcodone-acetaminophen 5-325 MG tablet Commonly known as: NORCO/VICODIN   losartan 25 MG tablet Commonly known as: COZAAR   oxyCODONE-acetaminophen 5-325 MG tablet Commonly known as: Percocet   pregabalin 75 MG capsule Commonly known as: Lyrica   silver sulfADIAZINE 1 % cream Commonly known  as: Silvadene       TAKE these medications    acetaminophen 500 MG tablet Commonly known as: TYLENOL Take 2 tablets (1,000 mg total) by mouth every 6 (six) hours as needed. Replaces: acetaminophen 650 MG CR tablet   allopurinol 300 MG tablet Commonly known as: ZYLOPRIM Take 1 tablet (300 mg total) by mouth daily.   amLODipine 10 MG tablet Commonly known as: NORVASC Take 1 tablet (10 mg total) by mouth daily.   aspirin 81 MG chewable tablet Chew 1 tablet (81 mg total) by mouth daily.   atorvastatin 80 MG tablet Commonly known as: LIPITOR Take 1 tablet (80 mg total) by mouth daily.   colchicine 0.6 MG tablet Take 1 tablet (0.6  mg total) by mouth daily as needed. What changed: reasons to take this   DULoxetine 30 MG capsule Commonly known as: CYMBALTA Take 30 mg by mouth daily.   EYE DROPS OP Place 2 drops into both eyes daily as needed (dry eye).   HumaLOG Mix 75/25 (75-25) 100 UNIT/ML Susp injection Generic drug: insulin lispro protamine-lispro Inject 100 Units into the skin 2 (two) times a day.   Jardiance 25 MG Tabs tablet Generic drug: empagliflozin Take 25 mg by mouth daily.   naloxone 4 MG/0.1ML Liqd nasal spray kit Commonly known as: NARCAN Place 1 spray into the nose once.   oxyCODONE 5 MG immediate release tablet Commonly known as: Oxy IR/ROXICODONE Take 1 tablet (5 mg total) by mouth every 4 (four) hours as needed for moderate pain.   Ozempic (0.25 or 0.5 MG/DOSE) 2 MG/3ML Sopn Generic drug: Semaglutide(0.25 or 0.5MG/DOS) Inject 0.5 mg as directed once a week. fridays   TUMS PO Take 2 tablets by mouth daily as needed (reflux).        Follow-up Information     Maczis, Carlena Hurl, PA-C Follow up on 12/10/2021.   Specialty: General Surgery Why: 11:45am, Arrive 30 minutes prior to your appointment time, Please bring your insurance card and photo ID Contact information: [place] [address] [phone]                Discharge Exam: Danley Danker Weights   11/20/21 0542  Weight: (!) 158.8 kg   General exam: Appears calm and comfortable  Respiratory system: Clear to auscultation. Respiratory effort normal. Cardiovascular system: S1 & S2 heard, RRR. No JVD, murmurs, rubs, gallops or clicks. No pedal edema. Gastrointestinal system: Abdomen is nondistended, soft and nontender. No organomegaly or masses felt. Normal bowel sounds heard. Central nervous system: Alert and oriented. No focal neurological deficits. Extremities: Symmetric 5 x 5 power. Skin: No rashes, lesions or ulcers Psychiatry: Judgement and insight appear  normal. Mood & affect appropriate.    Condition at discharge: fair  The results of significant diagnostics from this hospitalization (including imaging, microbiology, ancillary and laboratory) are listed below for reference.   Imaging Studies: CT Renal Stone Study  Result Date: 11/20/2021 CLINICAL DATA:  Right abdominal pain for 3 weeks. Cholelithiasis. Possible right renal calculi. EXAM: CT ABDOMEN AND PELVIS WITHOUT CONTRAST TECHNIQUE: Multidetector CT imaging of the abdomen and pelvis was performed following the standard protocol without IV contrast. RADIATION DOSE REDUCTION: This exam was performed according to the departmental dose-optimization program which includes automated exposure control, adjustment of the mA and/or kV according to patient size and/or use of iterative reconstruction technique. COMPARISON:  06/22/2021 FINDINGS: Lower chest: Chronic rounded atelectasis in the right lower lobe on image 1  series 2 as shown on prior exams including 11/04/2018. Adjacent pleural thickening or peripheral atelectasis. Hepatobiliary: Numerous gallstones identified measuring up to about 1.1 cm in long axis. These are of variable size. No definite stones identified in the common bile duct. No compelling evidence of intrahepatic biliary dilatation or focal hepatic lesion on today's noncontrast exam. Pancreas: Unremarkable Spleen: Unremarkable Adrenals/Urinary Tract: Both adrenal glands appear normal. Symmetric mild bilateral perirenal stranding appears to be chronic. No urinary tract calculi. No hydronephrosis or hydroureter. Urinary bladder unremarkable. Stomach/Bowel: Unremarkable, appendix appears unchanged without inflammatory findings. Vascular/Lymphatic: 1.2 cm left periaortic lymph node on image 45 series 2, formerly the same. 1.1 cm right common iliac lymph node on image 58 series 2, formerly 1.2 cm. Additional borderline prominent retroperitoneal, porta hepatis, and iliac lymph nodes similar to  previous. Reproductive: Unremarkable Other: No supplemental non-categorized findings. Musculoskeletal: Short pedicles, facet arthropathy, and mild disc bulges lead to possible mild bilateral foraminal impingement at the L4-5 and L5-S1 levels. IMPRESSION: 1. A specific cause for the patient's right abdominal pain is not identified. 2. Cholelithiasis. 3. Chronic rounded atelectasis in the right lower lobe with adjacent pleural thickening or peripheral atelectasis. 4. Borderline prominent retroperitoneal, porta hepatis, and iliac lymph nodes, similar to prior. 5. Lower lumbar impingement at L4-5 and L5-S1. Electronically Signed   By: Van Clines M.D.   On: 11/20/2021 16:56   US Abdomen Limited RUQ (LIVER/GB)  Result Date: 11/20/2021 CLINICAL DATA:  RIGHT upper quadrant abdominal pain EXAM: ULTRASOUND ABDOMEN LIMITED RIGHT UPPER QUADRANT COMPARISON:  05/12/2020 FINDINGS: Gallbladder: Multiple shadowing calculi within gallbladder up to 8 mm diameter. No gallbladder wall thickening, pericholecystic fluid, or sonographic Murphy sign. Small amount of associated sludge in gallbladder. Common bile duct: Diameter: 3 mm Liver: Slightly echogenic parenchyma, likely fatty infiltration though this can be seen with cirrhosis and certain infiltrative disorders. No definite hepatic mass or nodularity. Portal vein is patent on color Doppler imaging with normal direction of blood flow towards the liver. Other: No RIGHT upper quadrant free fluid. IMPRESSION: Cholelithiasis without evidence of acute cholecystitis or biliary dilatation. Probable fatty infiltration of liver as above. Electronically Signed   By: Lavonia Dana M.D.   On: 11/20/2021 09:55    Microbiology: Results for orders placed or performed during the hospital encounter of 06/21/21  Blood Culture (routine x 2)     Status: None   Collection Time: 06/21/21 11:25 PM   Specimen: BLOOD  Result Value Ref Range Status   Specimen Description   Final    BLOOD  RIGHT ANTECUBITAL Performed at [facility] [address] [place], [place] [address]    Special Requests   Final    BOTTLES DRAWN AEROBIC AND ANAEROBIC Blood Culture results may not be optimal due to an excessive volume of blood received in culture bottles Performed at [facility] [address], [place], [place] [address]    Culture   Final    NO GROWTH 5 DAYS Performed at [facility], [place] [address] [place], [place] [address]    Report Status 06/27/2021 FINAL  Final  MRSA Next Gen by PCR, Nasal     Status: None   Collection Time: 06/22/21  1:38 PM   Specimen: Nasal Mucosa; Nasal Swab  Result Value Ref Range Status   MRSA by PCR Next Gen NOT DETECTED NOT DETECTED Final    Comment: (NOTE) The GeneXpert MRSA Assay (FDA approved for NASAL specimens only), is one component of a  comprehensive MRSA colonization surveillance program. It is not intended to diagnose MRSA infection nor to guide or monitor treatment for MRSA infections. Test performance is not FDA approved in patients less than 43 years old. Performed at [facility], [place] [address] [place], [place] [address]   Blood Culture (routine x 2)     Status: None   Collection Time: 06/23/21  4:59 AM   Specimen: BLOOD  Result Value Ref Range Status   Specimen Description   Final    BLOOD LEFT ANTECUBITAL Performed at [facility] [address], [place], [place] [address]    Special Requests   Final    BOTTLES DRAWN AEROBIC ONLY Blood Culture adequate volume Performed at [facility] [address] [place], [place] [address]    Culture   Final    NO GROWTH 5 DAYS Performed at [facility], [place] [address] [place], [place] [address]    Report Status 06/28/2021 FINAL  Final    Labs: CBC: Recent Labs  Lab 11/20/21 0545 11/21/21 0443  WBC 9.0 6.9  NEUTROABS 4.3 3.4  HGB 14.0 13.6  HCT 39.7 39.9  MCV  89.8 90.7  PLT 277 893   Basic Metabolic Panel: Recent Labs  Lab 11/20/21 0545 11/21/21 0443 11/22/21 1035 11/23/21 0858  NA 134* 133* 134* 133*  K 3.9 4.5 5.0 4.2  CL 106 105 104 105  CO2 21* _0 GLUCOSE 136* 322* 335* 165*  BUN 48* 39* 34* 30*  CREATININE 2.68* 2.12* 2.01* 1.95*  CALCIUM 8.3* 8.4* 8.0* 8.0*   Liver Function Tests: Recent Labs  Lab 11/20/21 0545 11/21/21 0443  AST 26 17  ALT 20 16  ALKPHOS 60 55  BILITOT 0.2* 0.8  PROT 6.7 6.5  ALBUMIN 3.2* 3.0*   CBG: Recent Labs  Lab 11/22/21 1631 11/22/21 2224 11/23/21 0342 11/23/21 0717 11/23/21 1134  GLUCAP 179* 181* 244* 157* 170*    Discharge time spent: 46 minutes.   Signed: Hosie Poisson, MD [facility] 11/24/2021

## 2022-02-13 DIAGNOSIS — H524 Presbyopia: Secondary | ICD-10-CM | POA: Diagnosis not present

## 2022-02-13 DIAGNOSIS — E113393 Type 2 diabetes mellitus with moderate nonproliferative diabetic retinopathy without macular edema, bilateral: Secondary | ICD-10-CM | POA: Diagnosis not present

## 2022-02-17 DIAGNOSIS — J069 Acute upper respiratory infection, unspecified: Secondary | ICD-10-CM | POA: Diagnosis not present

## 2022-02-17 DIAGNOSIS — J029 Acute pharyngitis, unspecified: Secondary | ICD-10-CM | POA: Diagnosis not present

## 2022-02-17 DIAGNOSIS — Z20822 Contact with and (suspected) exposure to covid-19: Secondary | ICD-10-CM | POA: Diagnosis not present

## 2022-03-12 DIAGNOSIS — N1832 Chronic kidney disease, stage 3b: Secondary | ICD-10-CM | POA: Diagnosis not present

## 2022-03-12 DIAGNOSIS — Z794 Long term (current) use of insulin: Secondary | ICD-10-CM | POA: Diagnosis not present

## 2022-03-12 DIAGNOSIS — E039 Hypothyroidism, unspecified: Secondary | ICD-10-CM | POA: Diagnosis not present

## 2022-03-12 DIAGNOSIS — Z7984 Long term (current) use of oral hypoglycemic drugs: Secondary | ICD-10-CM | POA: Diagnosis not present

## 2022-03-12 DIAGNOSIS — E113292 Type 2 diabetes mellitus with mild nonproliferative diabetic retinopathy without macular edema, left eye: Secondary | ICD-10-CM | POA: Diagnosis not present

## 2022-03-12 DIAGNOSIS — E1122 Type 2 diabetes mellitus with diabetic chronic kidney disease: Secondary | ICD-10-CM | POA: Diagnosis not present

## 2022-03-12 DIAGNOSIS — E1142 Type 2 diabetes mellitus with diabetic polyneuropathy: Secondary | ICD-10-CM | POA: Diagnosis not present

## 2022-04-02 DIAGNOSIS — Z0000 Encounter for general adult medical examination without abnormal findings: Secondary | ICD-10-CM | POA: Diagnosis not present

## 2022-04-02 DIAGNOSIS — Z6841 Body Mass Index (BMI) 40.0 and over, adult: Secondary | ICD-10-CM | POA: Diagnosis not present

## 2022-05-06 DIAGNOSIS — I10 Essential (primary) hypertension: Secondary | ICD-10-CM | POA: Diagnosis not present

## 2022-05-06 DIAGNOSIS — Z6838 Body mass index (BMI) 38.0-38.9, adult: Secondary | ICD-10-CM | POA: Diagnosis not present

## 2022-05-06 DIAGNOSIS — R110 Nausea: Secondary | ICD-10-CM | POA: Diagnosis not present

## 2022-05-21 ENCOUNTER — Emergency Department (HOSPITAL_BASED_OUTPATIENT_CLINIC_OR_DEPARTMENT_OTHER)
Admission: EM | Admit: 2022-05-21 | Discharge: 2022-05-21 | Disposition: A | Discharge status: Home / Self Care | Payer: BC Managed Care – PPO | Attending: Emergency Medicine | Admitting: Emergency Medicine

## 2022-05-21 ENCOUNTER — Emergency Department (HOSPITAL_BASED_OUTPATIENT_CLINIC_OR_DEPARTMENT_OTHER): Payer: BC Managed Care – PPO

## 2022-05-21 ENCOUNTER — Encounter (HOSPITAL_BASED_OUTPATIENT_CLINIC_OR_DEPARTMENT_OTHER): Payer: Self-pay

## 2022-05-21 ENCOUNTER — Emergency Department (HOSPITAL_BASED_OUTPATIENT_CLINIC_OR_DEPARTMENT_OTHER): Payer: BC Managed Care – PPO | Admitting: Radiology

## 2022-05-21 ENCOUNTER — Other Ambulatory Visit: Payer: Self-pay

## 2022-05-21 DIAGNOSIS — Z0389 Encounter for observation for other suspected diseases and conditions ruled out: Secondary | ICD-10-CM | POA: Diagnosis not present

## 2022-05-21 DIAGNOSIS — R079 Chest pain, unspecified: Secondary | ICD-10-CM | POA: Diagnosis not present

## 2022-05-21 DIAGNOSIS — Z7982 Long term (current) use of aspirin: Secondary | ICD-10-CM | POA: Diagnosis not present

## 2022-05-21 DIAGNOSIS — Z7984 Long term (current) use of oral hypoglycemic drugs: Secondary | ICD-10-CM | POA: Diagnosis not present

## 2022-05-21 DIAGNOSIS — E119 Type 2 diabetes mellitus without complications: Secondary | ICD-10-CM | POA: Diagnosis not present

## 2022-05-21 DIAGNOSIS — Z794 Long term (current) use of insulin: Secondary | ICD-10-CM | POA: Diagnosis not present

## 2022-05-21 DIAGNOSIS — R0781 Pleurodynia: Secondary | ICD-10-CM | POA: Diagnosis not present

## 2022-05-21 DIAGNOSIS — Z20822 Contact with and (suspected) exposure to covid-19: Secondary | ICD-10-CM | POA: Diagnosis not present

## 2022-05-21 DIAGNOSIS — K047 Periapical abscess without sinus: Secondary | ICD-10-CM

## 2022-05-21 LAB — BASIC METABOLIC PANEL
Anion gap: 12 (ref 5–15)
BUN: 33 mg/dL — ABNORMAL HIGH (ref 6–20)
CO2: 21 mmol/L — ABNORMAL LOW (ref 22–32)
Calcium: 8.9 mg/dL (ref 8.9–10.3)
Chloride: 99 mmol/L (ref 98–111)
Creatinine, Ser: 2.34 mg/dL — ABNORMAL HIGH (ref 0.61–1.24)
GFR, Estimated: 34 mL/min — ABNORMAL LOW (ref >60)
Glucose, Bld: 270 mg/dL — ABNORMAL HIGH (ref 70–99)
Potassium: 3.9 mmol/L (ref 3.5–5.1)
Sodium: 132 mmol/L — ABNORMAL LOW (ref 135–145)

## 2022-05-21 LAB — HEPATIC FUNCTION PANEL
ALT: 20 U/L (ref 0–44)
AST: 13 U/L — ABNORMAL LOW (ref 15–41)
Albumin: 3.6 g/dL (ref 3.5–5.0)
Alkaline Phosphatase: 66 U/L (ref 38–126)
Bilirubin, Direct: 0.1 mg/dL (ref 0.0–0.2)
Indirect Bilirubin: 0.2 mg/dL — ABNORMAL LOW (ref 0.3–0.9)
Total Bilirubin: 0.3 mg/dL (ref 0.3–1.2)
Total Protein: 7 g/dL (ref 6.5–8.1)

## 2022-05-21 LAB — TROPONIN I (HIGH SENSITIVITY): Troponin I (High Sensitivity): 5 ng/L (ref <18)

## 2022-05-21 LAB — CBC
HCT: 39.7 % (ref 39.0–52.0)
Hemoglobin: 13.7 g/dL (ref 13.0–17.0)
MCH: 31.6 pg (ref 26.0–34.0)
MCHC: 34.5 g/dL (ref 30.0–36.0)
MCV: 91.5 fL (ref 80.0–100.0)
Platelets: 273 10*3/uL (ref 150–400)
RBC: 4.34 MIL/uL (ref 4.22–5.81)
RDW: 13.4 % (ref 11.5–15.5)
WBC: 7.3 10*3/uL (ref 4.0–10.5)
nRBC: 0 % (ref 0.0–0.2)

## 2022-05-21 LAB — D-DIMER, QUANTITATIVE: D-Dimer, Quant: 0.78 ug/mL-FEU — ABNORMAL HIGH (ref 0.00–0.50)

## 2022-05-21 LAB — RESP PANEL BY RT-PCR (RSV, FLU A&B, COVID)  RVPGX2
Influenza A by PCR: NEGATIVE
Influenza B by PCR: NEGATIVE
Resp Syncytial Virus by PCR: NEGATIVE
SARS Coronavirus 2 by RT PCR: NEGATIVE

## 2022-05-21 MED ORDER — SODIUM CHLORIDE 0.9 % IV BOLUS
1000.0000 mL | Freq: Once | INTRAVENOUS | Status: AC
  Administered 2022-05-21: 1000 mL via INTRAVENOUS

## 2022-05-21 MED ORDER — IOHEXOL 350 MG/ML SOLN
100.0000 mL | Freq: Once | INTRAVENOUS | Status: AC | PRN
  Administered 2022-05-21: 75 mL via INTRAVENOUS

## 2022-05-21 MED ORDER — HYDROCODONE-ACETAMINOPHEN 5-325 MG PO TABS
1.0000 | ORAL_TABLET | Freq: Once | ORAL | Status: AC
  Administered 2022-05-21: 1 via ORAL
  Filled 2022-05-21: qty 1

## 2022-05-21 MED ORDER — HYDROCODONE-ACETAMINOPHEN 5-325 MG PO TABS: 1.0000 | ORAL_TABLET | Freq: Four times a day (QID) | ORAL | 0 refills | Status: DC | PRN

## 2022-05-21 MED ORDER — KETOROLAC TROMETHAMINE 15 MG/ML IJ SOLN
15.0000 mg | Freq: Once | INTRAMUSCULAR | Status: AC
  Administered 2022-05-21: 15 mg via INTRAVENOUS
  Filled 2022-05-21: qty 1

## 2022-05-21 MED ORDER — AMOXICILLIN-POT CLAVULANATE 875-125 MG PO TABS: 1.0000 | ORAL_TABLET | Freq: Two times a day (BID) | ORAL | 0 refills | Status: DC

## 2022-05-21 NOTE — ED Triage Notes (Signed)
Patient here POV from Home.  Endorses Right Sided facial/jaw pain for a few days. Also notes SOB noted with [facility] for a few days.   NAD Noted during Triage. A&Ox4. GCS 15. Ambulatory.

## 2022-05-21 NOTE — ED Provider Notes (Signed)
[place] EMERGENCY DEPARTMENT AT [facility] Provider Note   CSN: [id number] Arrival date & time: 05/21/22  1610     History  Chief Complaint  Patient presents with   Shortness of Breath    Barry [name] is a 47 y.o. male, history of diabetes, who presents to the ED secondary to right jaw pain radiating to the right ear., that is been going on for the last 2 weeks and chest pain, deep breaths, and has been going on for the last 2 days.    He states that he has had some dental problems for some time, but right sided jaw pain has been getting worse the last 2 weeks. Denies drainage, facial swelling, fever, chills. Notes he has not seen a dentist in quite a long time.   Also states having chest pain for the last two days. Started suddenly on Tuesday and is sharp and stabbing, worse with deep breaths. Denies pain w/food intake or movement. No hx of DVTs/Pes. Pain is moderate and has not been going away so he decided to come to ED today. Denies leg swelling. Endorses some dyspnea. No recent illness, including GI or respiratory.  Home Medications Prior to Admission medications   Medication Sig Start Date End Date Taking? Authorizing Provider  amoxicillin-clavulanate (AUGMENTIN) 875-125 MG tablet Take 1 tablet by mouth every 12 (twelve) hours. 05/21/22  Yes Nataliee Shurtz L, PA  HYDROcodone-acetaminophen (NORCO) 5-325 MG tablet Take 1 tablet by mouth every 6 (six) hours as needed for moderate pain. 05/21/22  Yes Brylea Pita L, PA  acetaminophen (TYLENOL) 500 MG tablet Take 2 tablets (1,000 mg total) by mouth every 6 (six) hours as needed. 11/22/21   Barnetta Chapel, PA-C  allopurinol (ZYLOPRIM) 300 MG tablet Take 1 tablet (300 mg total) by mouth daily. 11/23/21   Kathlen Mody, MD  amLODipine (NORVASC) 10 MG tablet Take 1 tablet (10 mg total) by mouth daily. 11/23/21 02/21/22  Kathlen Mody, MD  aspirin 81 MG chewable tablet Chew 1 tablet (81 mg total) by mouth daily. 11/23/21   Kathlen Mody,  MD  atorvastatin (LIPITOR) 80 MG tablet Take 1 tablet (80 mg total) by mouth daily. 11/23/21   Kathlen Mody, MD  Calcium Carbonate Antacid (TUMS PO) Take 2 tablets by mouth daily as needed (reflux).    [provider]  Carboxymethylcellulose Sodium (EYE DROPS OP) Place 2 drops into both eyes daily as needed (dry eye).    [provider]  colchicine 0.6 MG tablet Take 1 tablet (0.6 mg total) by mouth daily as needed. Patient taking differently: Take 0.6 mg by mouth daily as needed (gout). 06/27/21   Amin, Loura Halt, MD  DULoxetine (CYMBALTA) 30 MG capsule Take 30 mg by mouth daily.    [provider]  HUMALOG MIX 75/25 (75-25) 100 UNIT/ML SUSP injection Inject 100 Units into the skin 2 (two) times a day. 06/07/18   [provider]  JARDIANCE 25 MG TABS tablet Take 25 mg by mouth daily. 10/18/18   [provider]  naloxone [facility]) nasal spray 4 mg/0.1 mL Place 1 spray into the nose once. 06/12/21   [provider]  oxyCODONE (OXY IR/ROXICODONE) 5 MG immediate release tablet Take 1 tablet (5 mg total) by mouth every 4 (four) hours as needed for moderate pain. 11/22/21   Barnetta Chapel, PA-C  OZEMPIC, 0.25 OR 0.5 MG/DOSE, 2 MG/3ML SOPN Inject 0.5 mg as directed once a week. fridays 11/23/21   Kathlen Mody, MD  Allergies    Patient has no known allergies.    Review of Systems   Review of Systems  Constitutional:  Negative for fever.  HENT:  Positive for dental problem.   Respiratory:  Positive for shortness of breath.   Cardiovascular:  Positive for chest pain.    Physical Exam Updated Vital Signs BP 134/77 (BP Location: Right Arm)   Pulse (!) 105   Temp 98.7 F (37.1 C) (Oral)   Resp 18   Ht 6\' 3"  (1.905 m)   Wt (!) 138.8 kg   SpO2 93%   BMI 38.25 kg/m  Physical Exam Vitals and nursing note reviewed.  Constitutional:      General: He is not in acute distress.    Appearance: He is well-developed.  HENT:     Head:  Normocephalic and atraumatic.     Right Ear: Tympanic membrane normal.     Left Ear: Tympanic membrane normal.     Mouth/Throat:     Comments: +dental disease and caries to bilateral 2nd molars. No facial swelling or erythema. No fluctuance.  Eyes:     Conjunctiva/sclera: Conjunctivae normal.  Cardiovascular:     Rate and Rhythm: Regular rhythm. Tachycardia present.     Heart sounds: No murmur heard. Pulmonary:     Effort: Pulmonary effort is normal. No respiratory distress.     Breath sounds: Normal breath sounds.  Abdominal:     Palpations: Abdomen is soft.     Tenderness: There is no abdominal tenderness.  Musculoskeletal:        General: No swelling.     Cervical back: Neck supple.  Skin:    General: Skin is warm and dry.     Capillary Refill: Capillary refill takes less than 2 seconds.  Neurological:     Mental Status: He is alert.  Psychiatric:        Mood and Affect: Mood normal.     ED Results / Procedures / Treatments   Labs (all labs ordered are listed, but only abnormal results are displayed) Labs Reviewed  BASIC METABOLIC PANEL - Abnormal; Notable for the following components:      Result Value   Sodium 132 (*)    CO2 21 (*)    Glucose, Bld 270 (*)    BUN 33 (*)    Creatinine, Ser 2.34 (*)    GFR, Estimated 34 (*)    All other components within normal limits  D-DIMER, QUANTITATIVE - Abnormal; Notable for the following components:   D-Dimer, Quant 0.78 (*)    All other components within normal limits  HEPATIC FUNCTION PANEL - Abnormal; Notable for the following components:   AST 13 (*)    Indirect Bilirubin 0.2 (*)    All other components within normal limits  RESP PANEL BY RT-PCR (RSV, FLU A&B, COVID)  RVPGX2  CBC  TROPONIN I (HIGH SENSITIVITY)  TROPONIN I (HIGH SENSITIVITY)    EKG EKG Interpretation  Date/Time:  Wednesday May 21 2022 16:18:59 EDT Ventricular Rate:  120 PR Interval:  154 QRS Duration: 100 QT Interval:  324 QTC  Calculation: 457 R Axis:   -2 Text Interpretation: Sinus tachycardia Otherwise normal ECG When compared with ECG of 22-Jun-2021 00:09, PREVIOUS ECG IS PRESENT Confirmed by Alona Bene [phone]) on 05/21/2022 5:14:42 PM  Radiology CT Angio Chest PE W and/or Wo Contrast  Result Date: 05/21/2022 CLINICAL DATA:  Concern focal embolism. EXAM: CT ANGIOGRAPHY CHEST WITH CONTRAST TECHNIQUE: Multidetector CT imaging of the chest was performed  using the standard protocol during bolus administration of intravenous contrast. Multiplanar CT image reconstructions and MIPs were obtained to evaluate the vascular anatomy. RADIATION DOSE REDUCTION: This exam was performed according to the departmental dose-optimization program which includes automated exposure control, adjustment of the mA and/or kV according to patient size and/or use of iterative reconstruction technique. CONTRAST:  75mL OMNIPAQUE IOHEXOL 350 MG/ML SOLN COMPARISON:  Chest CT dated 11/04/2018. Radiograph dated 05/21/2022. FINDINGS: Cardiovascular: There is no cardiomegaly or pericardial effusion. The thoracic aorta is unremarkable. Evaluation of the pulmonary arteries is limited due to respiratory motion and suboptimal opacification and timing of the contrast. No definite pulmonary artery embolus identified. Mediastinum/Nodes: There is no hilar or mediastinal adenopathy. The esophagus is grossly unremarkable. No mediastinal fluid collection. Lungs/Pleura: Chronic right lower lobe subpleural rounded atelectasis or scarring. Probable trace chronic right pleural effusion. The right lower lobe findings are similar to prior CT. No new consolidation. There is no pneumothorax. The central airways are patent. Upper Abdomen: No acute findings. Musculoskeletal: Stable Shannen Vernon hypodense lesion in the skin of the anterior chest wall similar to prior CT, possibly a sebaceous cyst. No acute osseous pathology. Review of the MIP images confirms the above findings. IMPRESSION: 1.  No acute intrathoracic pathology. No CT evidence of pulmonary artery embolus. 2. Chronic right lower lobe subpleural rounded atelectasis or scarring. Electronically Signed   By: Elgie Collard M.D.   On: 05/21/2022 18:18   DG Chest 2 View  Result Date: 05/21/2022 CLINICAL DATA:  Chest pain. EXAM: CHEST - 2 VIEW COMPARISON:  AP chest 06/21/2021 FINDINGS: Cardiac silhouette and mediastinal contours are within normal limits. The lungs are clear. No pleural effusion or pneumothorax. Mild multilevel degenerative disc changes of the upper thoracic spine. IMPRESSION: No active cardiopulmonary disease. Electronically Signed   By: Neita Garnet M.D.   On: 05/21/2022 16:50    Procedures Procedures    Medications Ordered in ED Medications  ketorolac (TORADOL) 15 MG/ML injection 15 mg (15 mg Intravenous Given 05/21/22 1703)  sodium chloride 0.9 % bolus 1,000 mL (1,000 mLs Intravenous New Bag/Given 05/21/22 1738)  iohexol (OMNIPAQUE) 350 MG/ML injection 100 mL (75 mLs Intravenous Contrast Given 05/21/22 1804)  HYDROcodone-acetaminophen (NORCO/VICODIN) 5-325 MG per tablet 1 tablet (1 tablet Oral Given 05/21/22 1826)    ED Course/ Medical Decision Making/ A&P                             Medical Decision Making Patient is a 47 year [name] male, here for 2 different issues.  He states he has had right-sided jaw pain that is been going on for the last few weeks, and that is getting worse, as well as some chest pain has been going on for the last couple days.  His chest pain is pleuritic, we will obtain a D-dimer to evaluate for possible PE, as well as troponins, and chest x-ray.  He has no abdominal pain, at this time, does not have any alcohol use, this is unlikely to be pancreatitis.  Additionally on exam he does have caries, and his mouth, and he possibly has infection, we will put him on Augmentin, for this.  He has no overt swelling, induration, trismus, and his TMs are within normal limits.  PERC positive, low risk  for [place].  Amount and/or Complexity of Data Reviewed Labs: ordered.    Details: D-dimer 0.78, troponin within normal limits Radiology: ordered.    Details: Chest x-ray clear, CTA shows  no PE, only some [name] scarring. ECG/medicine tests:  Decision-making details documented in ED Course. Discussion of management or test interpretation with external provider(s): Discussed with patient, etiology of chest pain is unclear at this time, heart score of 3.  He has a primary care doctor, and will follow-up with him in a few days.  Troponin within normal limits, no evidence of PE.  Does not appear to sound like pericarditis, as he has not had any viral symptoms, both as of late weight, and I do not hear any muffled sounds, or murmurs.  Additionally I think unlikely be musculoskeletal as he has no tenderness to palpation of chest, and unlikely to be GI as there is no relationship with food.  We will have him follow-up with his primary care doctor for further evaluation.  His tachycardia resolved, after receiving pain medication for his face.  We discussed and I write him information for dentist, for him to have further evaluation, and started on him antibiotics and Norco for the pain.  Return precautions were emphasized.  He was well-appearing at discharge.  Risk Prescription drug management.    Final Clinical Impression(s) / ED Diagnoses Final diagnoses:  Pleuritic chest pain  Dental infection    Rx / DC Orders ED Discharge Orders          Ordered    HYDROcodone-acetaminophen (NORCO) 5-325 MG tablet  Every 6 hours PRN        05/21/22 1843    amoxicillin-clavulanate (AUGMENTIN) 875-125 MG tablet  Every 12 hours        05/21/22 1843              Pete Pelt, PA 05/21/22 1850    Maia Plan, MD 05/22/22 [phone]

## 2022-05-21 NOTE — ED Notes (Signed)
RN reviewed discharge instructions with pt. Pt verbalized understanding and had no further questions. VSS upon discharge.  

## 2022-05-21 NOTE — Discharge Instructions (Signed)
Please follow-up with your primary care doctor, and dentist.  You appear to have some caries, and thus need to see dentist.  I put you on antibiotics pain medications for this.  If you have facial swelling, difficulty opening mouth, worsening pain please return to the ER.  Additionally your chest pain workup was pretty unremarkable, your heart enzyme was within normal limits, and you have no evidence of a blood clot.  The etiology of your chest pain is unknown, thus you will need to follow-up with your primary care doctor.  If you feel like your chest pain is getting worse, you have shortness of breath, severe nausea or vomiting please return to the ER.

## 2022-05-26 DIAGNOSIS — Z6838 Body mass index (BMI) 38.0-38.9, adult: Secondary | ICD-10-CM | POA: Diagnosis not present

## 2022-05-26 DIAGNOSIS — R058 Other specified cough: Secondary | ICD-10-CM | POA: Diagnosis not present

## 2022-05-26 DIAGNOSIS — Z20822 Contact with and (suspected) exposure to covid-19: Secondary | ICD-10-CM | POA: Diagnosis not present

## 2022-05-26 DIAGNOSIS — R112 Nausea with vomiting, unspecified: Secondary | ICD-10-CM | POA: Diagnosis not present

## 2022-05-26 DIAGNOSIS — J329 Chronic sinusitis, unspecified: Secondary | ICD-10-CM | POA: Diagnosis not present

## 2022-05-27 DIAGNOSIS — E119 Type 2 diabetes mellitus without complications: Secondary | ICD-10-CM | POA: Diagnosis not present

## 2022-05-27 DIAGNOSIS — Z79899 Other long term (current) drug therapy: Secondary | ICD-10-CM | POA: Diagnosis not present

## 2022-05-27 DIAGNOSIS — N1832 Chronic kidney disease, stage 3b: Secondary | ICD-10-CM | POA: Diagnosis not present

## 2022-05-27 DIAGNOSIS — R0602 Shortness of breath: Secondary | ICD-10-CM | POA: Diagnosis not present

## 2022-05-27 DIAGNOSIS — E1122 Type 2 diabetes mellitus with diabetic chronic kidney disease: Secondary | ICD-10-CM | POA: Diagnosis not present

## 2022-05-27 DIAGNOSIS — R112 Nausea with vomiting, unspecified: Secondary | ICD-10-CM | POA: Diagnosis not present

## 2022-05-27 DIAGNOSIS — I10 Essential (primary) hypertension: Secondary | ICD-10-CM | POA: Diagnosis not present

## 2022-05-28 DIAGNOSIS — E119 Type 2 diabetes mellitus without complications: Secondary | ICD-10-CM | POA: Diagnosis not present

## 2022-05-28 DIAGNOSIS — I10 Essential (primary) hypertension: Secondary | ICD-10-CM | POA: Diagnosis not present

## 2022-05-28 DIAGNOSIS — N1832 Chronic kidney disease, stage 3b: Secondary | ICD-10-CM | POA: Diagnosis not present

## 2022-05-28 DIAGNOSIS — R112 Nausea with vomiting, unspecified: Secondary | ICD-10-CM | POA: Diagnosis not present

## 2022-06-12 DIAGNOSIS — Z794 Long term (current) use of insulin: Secondary | ICD-10-CM | POA: Diagnosis not present

## 2022-06-12 DIAGNOSIS — E039 Hypothyroidism, unspecified: Secondary | ICD-10-CM | POA: Diagnosis not present

## 2022-06-12 DIAGNOSIS — E782 Mixed hyperlipidemia: Secondary | ICD-10-CM | POA: Diagnosis not present

## 2022-06-12 DIAGNOSIS — Z7984 Long term (current) use of oral hypoglycemic drugs: Secondary | ICD-10-CM | POA: Diagnosis not present

## 2022-06-12 DIAGNOSIS — E1142 Type 2 diabetes mellitus with diabetic polyneuropathy: Secondary | ICD-10-CM | POA: Diagnosis not present

## 2022-06-12 DIAGNOSIS — Z7985 Long-term (current) use of injectable non-insulin antidiabetic drugs: Secondary | ICD-10-CM | POA: Diagnosis not present

## 2022-06-23 DIAGNOSIS — N1832 Chronic kidney disease, stage 3b: Secondary | ICD-10-CM | POA: Diagnosis not present

## 2022-06-23 DIAGNOSIS — R809 Proteinuria, unspecified: Secondary | ICD-10-CM | POA: Diagnosis not present

## 2022-06-23 DIAGNOSIS — E1122 Type 2 diabetes mellitus with diabetic chronic kidney disease: Secondary | ICD-10-CM | POA: Diagnosis not present

## 2022-06-23 DIAGNOSIS — I129 Hypertensive chronic kidney disease with stage 1 through stage 4 chronic kidney disease, or unspecified chronic kidney disease: Secondary | ICD-10-CM | POA: Diagnosis not present

## 2022-07-11 DIAGNOSIS — Z20822 Contact with and (suspected) exposure to covid-19: Secondary | ICD-10-CM | POA: Diagnosis not present

## 2022-07-11 DIAGNOSIS — H6691 Otitis media, unspecified, right ear: Secondary | ICD-10-CM | POA: Diagnosis not present

## 2022-07-11 DIAGNOSIS — R0981 Nasal congestion: Secondary | ICD-10-CM | POA: Diagnosis not present

## 2022-07-11 DIAGNOSIS — Z6838 Body mass index (BMI) 38.0-38.9, adult: Secondary | ICD-10-CM | POA: Diagnosis not present

## 2022-07-11 DIAGNOSIS — I10 Essential (primary) hypertension: Secondary | ICD-10-CM | POA: Diagnosis not present

## 2022-08-03 ENCOUNTER — Emergency Department (HOSPITAL_COMMUNITY)
Admission: EM | Admit: 2022-08-03 | Discharge: 2022-08-03 | Disposition: A | Discharge status: Home / Self Care | Payer: BC Managed Care – PPO | Attending: Student | Admitting: Student

## 2022-08-03 ENCOUNTER — Encounter (HOSPITAL_COMMUNITY): Payer: Self-pay

## 2022-08-03 DIAGNOSIS — K0889 Other specified disorders of teeth and supporting structures: Secondary | ICD-10-CM | POA: Diagnosis not present

## 2022-08-03 DIAGNOSIS — Z7984 Long term (current) use of oral hypoglycemic drugs: Secondary | ICD-10-CM | POA: Diagnosis not present

## 2022-08-03 DIAGNOSIS — Z794 Long term (current) use of insulin: Secondary | ICD-10-CM | POA: Insufficient documentation

## 2022-08-03 DIAGNOSIS — K047 Periapical abscess without sinus: Secondary | ICD-10-CM | POA: Insufficient documentation

## 2022-08-03 DIAGNOSIS — E119 Type 2 diabetes mellitus without complications: Secondary | ICD-10-CM | POA: Insufficient documentation

## 2022-08-03 DIAGNOSIS — Z7982 Long term (current) use of aspirin: Secondary | ICD-10-CM | POA: Diagnosis not present

## 2022-08-03 MED ORDER — BENZOCAINE 10 % MT GEL: 1.0000 | OROMUCOSAL | 0 refills | Status: DC | PRN

## 2022-08-03 MED ORDER — KETOROLAC TROMETHAMINE 30 MG/ML IJ SOLN: 30.0000 mg | Freq: Once | INTRAMUSCULAR | Status: DC

## 2022-08-03 MED ORDER — AMOXICILLIN-POT CLAVULANATE 875-125 MG PO TABS: 1.0000 | ORAL_TABLET | Freq: Two times a day (BID) | ORAL | 0 refills | Status: DC

## 2022-08-03 MED ORDER — AMOXICILLIN-POT CLAVULANATE 875-125 MG PO TABS
1.0000 | ORAL_TABLET | Freq: Once | ORAL | Status: AC
  Administered 2022-08-03: 1 via ORAL
  Filled 2022-08-03: qty 1

## 2022-08-03 MED ORDER — ACETAMINOPHEN 325 MG PO TABS
650.0000 mg | ORAL_TABLET | Freq: Once | ORAL | Status: AC
  Administered 2022-08-03: 650 mg via ORAL
  Filled 2022-08-03: qty 2

## 2022-08-03 MED ORDER — ACETAMINOPHEN 500 MG PO TABS: 500.0000 mg | ORAL_TABLET | Freq: Four times a day (QID) | ORAL | 0 refills | Status: DC | PRN

## 2022-08-03 NOTE — Discharge Instructions (Addendum)
Please follow-up with your dentist.  If you do not have a dentist, you will need to use the resource guide to find a dentist, or go to the [facility] dental clinic, as they will provide you with free or reduced cost dental services.  Your pain will keep coming back until you get your dental problem taking care of.  Return to the ER if you have any facial swelling, redness, or tenderness that is worsening

## 2022-08-03 NOTE — ED Triage Notes (Signed)
Pt arrived via POV, c/o right ear and sharp right dental pain.

## 2022-08-03 NOTE — ED Provider Notes (Signed)
[name] EMERGENCY DE[facility]MENT AT [facility] Provider Note   CSN: [id number] Arrival date & time: 08/03/22  0744     History  Chief Complaint  Patient presents with   Otalgia   Dental Pain    [name] is a 47 y.o. male, hx of DMII, who presents to the ED secondary to right ear pain, radiating down to the right jaw, this been going on for the last few weeks.  States that it feels very similar to when he had an ear infection.  States it feels very achy, and tender.  Also reports that he has not been to the dentist in several years.  Denies any fevers, chills, facial swelling, or redness.    Home Medications Prior to Admission medications   Medication Sig Start Date End Date Taking? Authorizing Provider  acetaminophen (TYLENOL) 500 MG tablet Take 1 tablet (500 mg total) by mouth every 6 (six) hours as needed. 08/03/22  Yes Donalyn Schneeberger L, PA  amoxicillin-clavulanate (AUGMENTIN) 875-125 MG tablet Take 1 tablet by mouth every 12 (twelve) hours. 08/03/22  Yes Asar Evilsizer L, PA  benzocaine (ORAJEL) 10 % mucosal gel Use as directed 1 Application in the mouth or throat as needed for mouth pain. 08/03/22  Yes Viviene Thurston L, PA  allopurinol (ZYLOPRIM) 300 MG tablet Take 1 tablet (300 mg total) by mouth daily. 11/23/21   Kathlen Mody, MD  amLODipine (NORVASC) 10 MG tablet Take 1 tablet (10 mg total) by mouth daily. 11/23/21 02/21/22  Kathlen Mody, MD  aspirin 81 MG chewable tablet Chew 1 tablet (81 mg total) by mouth daily. 11/23/21   Kathlen Mody, MD  atorvastatin (LIPITOR) 80 MG tablet Take 1 tablet (80 mg total) by mouth daily. 11/23/21   Kathlen Mody, MD  Calcium Carbonate Antacid (TUMS PO) Take 2 tablets by mouth daily as needed (reflux).    [provider]  Carboxymethylcellulose Sodium (EYE DROPS OP) Place 2 drops into both eyes daily as needed (dry eye).    [provider]  colchicine 0.6 MG tablet Take 1 tablet (0.6 mg total) by mouth daily as  needed. Patient taking differently: Take 0.6 mg by mouth daily as needed (gout). 06/27/21   Amin, Loura Halt, MD  DULoxetine (CYMBALTA) 30 MG capsule Take 30 mg by mouth daily.    [provider]  HUMALOG MIX 75/25 (75-25) 100 UNIT/ML SUSP injection Inject 100 Units into the skin 2 (two) times a day. 06/07/18   [provider]  HYDROcodone-acetaminophen (NORCO) 5-325 MG tablet Take 1 tablet by mouth every 6 (six) hours as needed for moderate pain. 05/21/22   Rikayla Demmon L, PA  JARDIANCE 25 MG TABS tablet Take 25 mg by mouth daily. 10/18/18   [provider]  naloxone [facility]) nasal spray 4 mg/0.1 mL Place 1 spray into the nose once. 06/12/21   [provider]  oxyCODONE (OXY IR/ROXICODONE) 5 MG immediate release tablet Take 1 tablet (5 mg total) by mouth every 4 (four) hours as needed for moderate pain. 11/22/21   Barnetta Chapel, PA-C  OZEMPIC, 0.25 OR 0.5 MG/DOSE, 2 MG/3ML SOPN Inject 0.5 mg as directed once a week. fridays 11/23/21   Kathlen Mody, MD      Allergies    Patient has no known allergies.    Review of Systems   Review of Systems  Constitutional:  Negative for fever.  HENT:  Positive for dental problem and ear pain.     Physical Exam Updated Vital  Signs BP (!) 197/107 (BP Location: Left Arm)   Pulse 88   Temp 97.8 F (36.6 C) (Oral)   Resp 16   Ht 6\' 3"  (1.905 m)   Wt 122.5 kg   SpO2 100%   BMI 33.75 kg/m  Physical Exam Vitals and nursing note reviewed.  Constitutional:      General: He is not in acute distress.    Appearance: He is well-developed.  HENT:     Head: Normocephalic and atraumatic.     Right Ear: Tympanic membrane normal.     Left Ear: Tympanic membrane normal.     Ears:     Comments: No mastoid tenderness or sponginess    Nose: Nose normal.     Mouth/Throat:     Comments: Multiple dental caries, has a specifically, on the right upper molars, and right lower molars.  Missing teeth.  No fluctuance, or erythema of the  gumline's.  No overlying erythema, or edema of the face. Eyes:     General:        Right eye: No discharge.        Left eye: No discharge.     Conjunctiva/sclera: Conjunctivae normal.  Pulmonary:     Effort: No respiratory distress.  Neurological:     Mental Status: He is alert.     Comments: Clear speech.   Psychiatric:        Behavior: Behavior normal.        Thought Content: Thought content normal.     ED Results / Procedures / Treatments   Labs (all labs ordered are listed, but only abnormal results are displayed) Labs Reviewed - No data to display  EKG None  Radiology No results found.  Procedures Procedures    Medications Ordered in ED Medications  amoxicillin-clavulanate (AUGMENTIN) 875-125 MG per tablet 1 tablet (has no administration in time range)  acetaminophen (TYLENOL) tablet 650 mg (has no administration in time range)    ED Course/ Medical Decision Making/ A&P                             Medical Decision Making Patient is a 47 year old male, history of diabetes, here for tenderness to the right side of his mouth, radiating to his ear for the last month.  He does have a dental infection, he was here about 2 months ago for the same thing, and is back again, I informed him that this will recur until he gets the tooth pulled.  He voiced understanding he was given information for dental resources, and the [facility] clinic.  We sent him home with Augmentin and Tylenol for pain control as well as Orajel.   Final Clinical Impression(s) / ED Diagnoses Final diagnoses:  Dental infection    Rx / DC Orders ED Discharge Orders          Ordered    amoxicillin-clavulanate (AUGMENTIN) 875-125 MG tablet  Every 12 hours        08/03/22 0823    acetaminophen (TYLENOL) 500 MG tablet  Every 6 hours PRN        08/03/22 0823    benzocaine (ORAJEL) 10 % mucosal gel  As needed        08/03/22 0823              Stanely Sexson, Harley Alto, PA 08/03/22 2956    Glendora Score, MD 08/04/22 1104

## 2022-08-12 ENCOUNTER — Encounter (HOSPITAL_BASED_OUTPATIENT_CLINIC_OR_DEPARTMENT_OTHER): Payer: Self-pay | Admitting: Emergency Medicine

## 2022-08-12 ENCOUNTER — Other Ambulatory Visit: Payer: Self-pay

## 2022-08-12 ENCOUNTER — Emergency Department (HOSPITAL_BASED_OUTPATIENT_CLINIC_OR_DEPARTMENT_OTHER)
Admission: EM | Admit: 2022-08-12 | Discharge: 2022-08-12 | Disposition: A | Discharge status: Home / Self Care | Payer: BC Managed Care – PPO | Attending: Emergency Medicine | Admitting: Emergency Medicine

## 2022-08-12 DIAGNOSIS — K047 Periapical abscess without sinus: Secondary | ICD-10-CM | POA: Diagnosis not present

## 2022-08-12 DIAGNOSIS — S0993XA Unspecified injury of face, initial encounter: Secondary | ICD-10-CM | POA: Diagnosis not present

## 2022-08-12 DIAGNOSIS — S025XXA Fracture of tooth (traumatic), initial encounter for closed fracture: Secondary | ICD-10-CM

## 2022-08-12 DIAGNOSIS — Z7982 Long term (current) use of aspirin: Secondary | ICD-10-CM | POA: Diagnosis not present

## 2022-08-12 DIAGNOSIS — W19XXXA Unspecified fall, initial encounter: Secondary | ICD-10-CM | POA: Insufficient documentation

## 2022-08-12 MED ORDER — CLINDAMYCIN HCL 150 MG PO CAPS
300.0000 mg | ORAL_CAPSULE | Freq: Once | ORAL | Status: AC
  Administered 2022-08-12: 300 mg via ORAL
  Filled 2022-08-12: qty 2

## 2022-08-12 MED ORDER — CIPROFLOXACIN-DEXAMETHASONE 0.3-0.1 % OT SUSP: 4.0000 [drp] | Freq: Two times a day (BID) | OTIC | 0 refills | Status: DC

## 2022-08-12 MED ORDER — OXYCODONE HCL 5 MG PO TABS: 5.0000 mg | ORAL_TABLET | Freq: Four times a day (QID) | ORAL | 0 refills | Status: DC | PRN

## 2022-08-12 MED ORDER — IBUPROFEN 800 MG PO TABS
800.0000 mg | ORAL_TABLET | Freq: Once | ORAL | Status: AC
  Administered 2022-08-12: 800 mg via ORAL
  Filled 2022-08-12: qty 1

## 2022-08-12 MED ORDER — LIDOCAINE-EPINEPHRINE (PF) 2 %-1:200000 IJ SOLN
10.0000 mL | Freq: Once | INTRAMUSCULAR | Status: AC
  Administered 2022-08-12: 10 mL
  Filled 2022-08-12: qty 20

## 2022-08-12 MED ORDER — OXYCODONE HCL 5 MG PO TABS: 5.0000 mg | ORAL_TABLET | Freq: Four times a day (QID) | ORAL | 0 refills | Status: AC | PRN

## 2022-08-12 MED ORDER — CLINDAMYCIN HCL 300 MG PO CAPS: 300.0000 mg | ORAL_CAPSULE | Freq: Three times a day (TID) | ORAL | 0 refills | Status: AC

## 2022-08-12 MED ORDER — CLINDAMYCIN HCL 300 MG PO CAPS: 300.0000 mg | ORAL_CAPSULE | Freq: Three times a day (TID) | ORAL | 0 refills | Status: DC

## 2022-08-12 NOTE — ED Provider Notes (Addendum)
[place] EMERGENCY DEPARTMENT AT [facility] Provider Note   CSN: [id number] Arrival date & time: 08/12/22  1310     History  Chief Complaint  Patient presents with   Facial Swelling    Barry [name] is a 47 y.o. male.  Patient is a 47 year old male presenting for right-sided facial swelling with dental pain.  Patient was seen on August 03, 2022 approximately 9 days prior for similar tooth pain on the right lower second molar.  Patient was prescribed Augmentin with a 10-day prescription and recommendations for close follow-up with outpatient dentistry.  Patient has not yet seen dentistry however admits to worsening swelling, pain, redness, and foul odor in mouth.  Patient now also endorses right-sided ear pain as well as right-sided facial swelling.  He states he was diagnosed with an otitis media prior to being treated for his tooth pain.  He is currently on day 8 of 10 of Augmentin.  He denies difficulty swallowing or pooling of secretions.  The history is provided by the patient. No language interpreter was used.       Home Medications Prior to Admission medications   Medication Sig Start Date End Date Taking? Authorizing Provider  clindamycin (CLEOCIN) 300 MG capsule Take 1 capsule (300 mg total) by mouth 3 (three) times daily for 3 days. 08/12/22 08/15/22 Yes Edwin Dada P, DO  oxyCODONE (ROXICODONE) 5 MG immediate release tablet Take 1 tablet (5 mg total) by mouth every 6 (six) hours as needed for up to 3 days for severe pain. 08/12/22 08/15/22 Yes Edwin Dada P, DO  acetaminophen (TYLENOL) 500 MG tablet Take 1 tablet (500 mg total) by mouth every 6 (six) hours as needed. 08/03/22   Small, Brooke L, PA  allopurinol (ZYLOPRIM) 300 MG tablet Take 1 tablet (300 mg total) by mouth daily. 11/23/21   Kathlen Mody, MD  amLODipine (NORVASC) 10 MG tablet Take 1 tablet (10 mg total) by mouth daily. 11/23/21 02/21/22  Kathlen Mody, MD  amoxicillin-clavulanate (AUGMENTIN) 875-125 MG  tablet Take 1 tablet by mouth every 12 (twelve) hours. 08/03/22   Small, Brooke L, PA  aspirin 81 MG chewable tablet Chew 1 tablet (81 mg total) by mouth daily. 11/23/21   Kathlen Mody, MD  atorvastatin (LIPITOR) 80 MG tablet Take 1 tablet (80 mg total) by mouth daily. 11/23/21   Kathlen Mody, MD  benzocaine (ORAJEL) 10 % mucosal gel Use as directed 1 Application in the mouth or throat as needed for mouth pain. 08/03/22   Small, Brooke L, PA  Calcium Carbonate Antacid (TUMS PO) Take 2 tablets by mouth daily as needed (reflux).    [provider]  Carboxymethylcellulose Sodium (EYE DROPS OP) Place 2 drops into both eyes daily as needed (dry eye).    [provider]  colchicine 0.6 MG tablet Take 1 tablet (0.6 mg total) by mouth daily as needed. Patient taking differently: Take 0.6 mg by mouth daily as needed (gout). 06/27/21   Amin, Loura Halt, MD  DULoxetine (CYMBALTA) 30 MG capsule Take 30 mg by mouth daily.    [provider]  HUMALOG MIX 75/25 (75-25) 100 UNIT/ML SUSP injection Inject 100 Units into the skin 2 (two) times a day. 06/07/18   [provider]  HYDROcodone-acetaminophen (NORCO) 5-325 MG tablet Take 1 tablet by mouth every 6 (six) hours as needed for moderate pain. 05/21/22   Small, Brooke L, PA  JARDIANCE 25 MG TABS tablet Take 25 mg by mouth daily. 10/18/18   [provider]  naloxone (NARCAN) nasal spray 4 mg/0.1 mL Place 1 spray into the nose once. 06/12/21   [provider]  OZEMPIC, 0.25 OR 0.5 MG/DOSE, 2 MG/3ML SOPN Inject 0.5 mg as directed once a week. fridays 11/23/21   Kathlen Mody, MD      Allergies    Patient has no known allergies.    Review of Systems   Review of Systems  Constitutional:  Negative for chills and fatigue.  HENT:  Positive for dental problem, ear pain and facial swelling. Negative for drooling, rhinorrhea, sneezing, sore throat and trouble swallowing.     Physical Exam Updated Vital Signs BP (!)  185/98   Pulse 87   Temp 97.7 F (36.5 C) (Oral)   Resp 18   Ht 6\' 3"  (1.905 m)   Wt 123 kg   SpO2 100%   BMI 33.89 kg/m  Physical Exam Vitals and nursing note reviewed.  Constitutional:      Appearance: Normal appearance.  HENT:     Head: Normocephalic and atraumatic.     Mouth/Throat:   Cardiovascular:     Rate and Rhythm: Normal rate and regular rhythm.  Pulmonary:     Effort: Pulmonary effort is normal.     Breath sounds: Normal breath sounds.  Neurological:     Mental Status: He is alert.     ED Results / Procedures / Treatments   Labs (all labs ordered are listed, but only abnormal results are displayed) Labs Reviewed - No data to display  EKG None  Radiology No results found.  Procedures .Marland KitchenIncision and Drainage  Date/Time: 08/12/2022 4:50 PM  Performed by: Franne Forts, DO Authorized by: Franne Forts, DO   Consent:    Consent obtained:  Verbal   Consent given by:  Patient   Risks discussed:  Bleeding, incomplete drainage, pain and infection   Alternatives discussed:  No treatment Universal protocol:    Immediately prior to procedure, a time out was called: yes     Patient identity confirmed:  Verbally with patient, arm band and provided demographic data Location:    Type:  Abscess   Location:  Mouth Procedure details:    Incision types:  Stab incision   Drainage amount:  Scant   Wound treatment:  Wound left open Post-procedure details:    Procedure completion:  Tolerated well, no immediate complications .Nerve Block  Date/Time: 08/12/2022 4:51 PM  Performed by: Franne Forts, DO Authorized by: Franne Forts, DO   Consent:    Consent obtained:  Verbal   Consent given by:  Patient   Risks discussed:  Infection, pain, swelling and bleeding   Alternatives discussed:  No treatment Universal protocol:    Immediately prior to procedure, a time out was called: yes     Patient identity confirmed:  Verbally with patient, arm band and  provided demographic data Indications:    Indications:  Pain relief Location:    Nerve block body site: mouth. Procedure details:    Block needle gauge:  25 G   Anesthetic injected:  Lidocaine 1% WITH epi Post-procedure details:    Procedure completion:  Tolerated well, no immediate complications     Medications Ordered in ED Medications  clindamycin (CLEOCIN) capsule 300 mg (has no administration in time range)  ibuprofen (ADVIL) tablet 800 mg (800 mg Oral Given 08/12/22 1554)  lidocaine-EPINEPHrine (XYLOCAINE W/EPI) 2 %-1:200000 (PF) injection 10 mL (10 mLs Infiltration Given by Other 08/12/22 1554)  ED Course/ Medical Decision Making/ A&P                             Medical Decision Making Risk Prescription drug management.   47 year old male presenting for recurrent tooth pain after failing 8 out of 10-day course of Augmentin outpatient with new symptoms of facial swelling and right-sided ear pain.  Physical exam demonstrates missing tooth at tooth #30.  Lateral to tooth #31 patient has pain, swelling, warmth, and fluctuance concerning for abscess.  Tooth #31 also demonstrating an Rennis Harding type II fracture.  Xylocaine popsicle applied.  Inferior alveolar nerve block applied.  Incision and drainage performed at bedside.  Serosanguineous and purulent drainage.  Patient is driving today.  Will give Tylenol in ED and prescription for Percocet to go home.  Will escalate antibiotic course to clindamycin and continued recommendation for follow-up with dentistry.  No acute complications to incision and drainage.  Facial swelling remains minimal.  No signs of airway obstruction.  Patient in no distress and overall condition improved here in the ED. Detailed discussions were had with the patient regarding current findings, and need for close f/u with PCP or on call doctor. The patient has been instructed to return immediately if the symptoms worsen in any way for re-evaluation. Patient  verbalized understanding and is in agreement with current care plan. All questions answered prior to discharge.         Final Clinical Impression(s) / ED Diagnoses Final diagnoses:  Closed fracture of tooth, initial encounter  Dental infection  Dental abscess    Rx / DC Orders ED Discharge Orders          Ordered    oxyCODONE (ROXICODONE) 5 MG immediate release tablet  Every 6 hours PRN        08/12/22 1650    clindamycin (CLEOCIN) 300 MG capsule  3 times daily        08/12/22 1650              Franne Forts, DO 08/12/22 1650    Edwin Dada P, DO 08/12/22 1651

## 2022-08-12 NOTE — ED Triage Notes (Signed)
Patient c/o right side facial swelling, dental pain on his bottom right molar, and ear pain. Patient seen last week at [facility] and given antibiotics which patient reports he has had no relief.

## 2022-08-12 NOTE — ED Notes (Signed)
Pt verbalized understanding of d/c instructions, meds, and followup care. Denies questions. VSS, no distress noted. Steady gait to exit with all belongings.  ?

## 2022-08-12 NOTE — Discharge Instructions (Signed)
[facility] [address] [place] [address]  [phone]  [facility]  [address], [place], [place] [address]

## 2022-10-22 DIAGNOSIS — E1165 Type 2 diabetes mellitus with hyperglycemia: Secondary | ICD-10-CM | POA: Diagnosis not present

## 2022-10-22 DIAGNOSIS — N1832 Chronic kidney disease, stage 3b: Secondary | ICD-10-CM | POA: Diagnosis not present

## 2022-10-22 DIAGNOSIS — Z794 Long term (current) use of insulin: Secondary | ICD-10-CM | POA: Diagnosis not present

## 2022-10-22 DIAGNOSIS — E039 Hypothyroidism, unspecified: Secondary | ICD-10-CM | POA: Diagnosis not present

## 2022-10-22 DIAGNOSIS — E1122 Type 2 diabetes mellitus with diabetic chronic kidney disease: Secondary | ICD-10-CM | POA: Diagnosis not present

## 2022-10-22 DIAGNOSIS — I10 Essential (primary) hypertension: Secondary | ICD-10-CM | POA: Diagnosis not present

## 2022-10-22 DIAGNOSIS — E782 Mixed hyperlipidemia: Secondary | ICD-10-CM | POA: Diagnosis not present

## 2022-10-23 DIAGNOSIS — I129 Hypertensive chronic kidney disease with stage 1 through stage 4 chronic kidney disease, or unspecified chronic kidney disease: Secondary | ICD-10-CM | POA: Diagnosis not present

## 2022-10-23 DIAGNOSIS — N1832 Chronic kidney disease, stage 3b: Secondary | ICD-10-CM | POA: Diagnosis not present

## 2022-10-23 DIAGNOSIS — E1122 Type 2 diabetes mellitus with diabetic chronic kidney disease: Secondary | ICD-10-CM | POA: Diagnosis not present

## 2022-11-19 DIAGNOSIS — J029 Acute pharyngitis, unspecified: Secondary | ICD-10-CM | POA: Diagnosis not present

## 2022-11-19 DIAGNOSIS — J020 Streptococcal pharyngitis: Secondary | ICD-10-CM | POA: Diagnosis not present

## 2022-11-19 DIAGNOSIS — Z20822 Contact with and (suspected) exposure to covid-19: Secondary | ICD-10-CM | POA: Diagnosis not present

## 2022-11-22 ENCOUNTER — Emergency Department (HOSPITAL_BASED_OUTPATIENT_CLINIC_OR_DEPARTMENT_OTHER)
Admission: EM | Admit: 2022-11-22 | Discharge: 2022-11-22 | Disposition: A | Discharge status: Home / Self Care | Payer: BC Managed Care – PPO | Attending: Emergency Medicine | Admitting: Emergency Medicine

## 2022-11-22 ENCOUNTER — Other Ambulatory Visit: Payer: Self-pay

## 2022-11-22 ENCOUNTER — Encounter (HOSPITAL_BASED_OUTPATIENT_CLINIC_OR_DEPARTMENT_OTHER): Payer: Self-pay

## 2022-11-22 DIAGNOSIS — M25532 Pain in left wrist: Secondary | ICD-10-CM | POA: Diagnosis not present

## 2022-11-22 DIAGNOSIS — M65842 Other synovitis and tenosynovitis, left hand: Secondary | ICD-10-CM | POA: Insufficient documentation

## 2022-11-22 DIAGNOSIS — Z7982 Long term (current) use of aspirin: Secondary | ICD-10-CM | POA: Insufficient documentation

## 2022-11-22 DIAGNOSIS — M79641 Pain in right hand: Secondary | ICD-10-CM | POA: Diagnosis not present

## 2022-11-22 DIAGNOSIS — M65841 Other synovitis and tenosynovitis, right hand: Secondary | ICD-10-CM | POA: Insufficient documentation

## 2022-11-22 DIAGNOSIS — M659 Unspecified synovitis and tenosynovitis, unspecified site: Secondary | ICD-10-CM

## 2022-11-22 DIAGNOSIS — Z794 Long term (current) use of insulin: Secondary | ICD-10-CM | POA: Insufficient documentation

## 2022-11-22 MED ORDER — DICLOFENAC SODIUM 1 % EX GEL: 4.0000 g | Freq: Four times a day (QID) | CUTANEOUS | 0 refills | Status: DC

## 2022-11-22 NOTE — ED Provider Notes (Signed)
[place] EMERGENCY DEPARTMENT AT [facility] Provider Note   CSN: [id number] Arrival date & time: 11/22/22  4098     History  Chief Complaint  Patient presents with   Hand Pain    [name] is a 47 y.o. male.  47 yo M with a chief complaints of bilateral hand pain.  Feels like the pain is gone on for a few days.  He wonder if it has something to do with him having strep.  He was diagnosed and started on antibiotics.  He has been out of work for about a week due to his illness.  Starting to feel better but noticed he had this wrist pain.  Worse on the left but starting to feel it on the right as well.  No trauma   Hand Pain       Home Medications Prior to Admission medications   Medication Sig Start Date End Date Taking? Authorizing Provider  diclofenac Sodium (VOLTAREN) 1 % GEL Apply 4 g topically 4 (four) times daily. 11/22/22  Yes Melene Plan, DO  acetaminophen (TYLENOL) 500 MG tablet Take 1 tablet (500 mg total) by mouth every 6 (six) hours as needed. 08/03/22   Small, Brooke L, PA  allopurinol (ZYLOPRIM) 300 MG tablet Take 1 tablet (300 mg total) by mouth daily. 11/23/21   Kathlen Mody, MD  amLODipine (NORVASC) 10 MG tablet Take 1 tablet (10 mg total) by mouth daily. 11/23/21 02/21/22  Kathlen Mody, MD  amoxicillin-clavulanate (AUGMENTIN) 875-125 MG tablet Take 1 tablet by mouth every 12 (twelve) hours. 08/03/22   Small, Brooke L, PA  aspirin 81 MG chewable tablet Chew 1 tablet (81 mg total) by mouth daily. 11/23/21   Kathlen Mody, MD  atorvastatin (LIPITOR) 80 MG tablet Take 1 tablet (80 mg total) by mouth daily. 11/23/21   Kathlen Mody, MD  benzocaine (ORAJEL) 10 % mucosal gel Use as directed 1 Application in the mouth or throat as needed for mouth pain. 08/03/22   Small, Brooke L, PA  Calcium Carbonate Antacid (TUMS PO) Take 2 tablets by mouth daily as needed (reflux).    [provider]  Carboxymethylcellulose Sodium (EYE DROPS OP) Place 2 drops into  both eyes daily as needed (dry eye).    [provider]  ciprofloxacin-dexamethasone (CIPRODEX) OTIC suspension Place 4 drops into the right ear 2 (two) times daily. 08/12/22   Edwin Dada P, DO  colchicine 0.6 MG tablet Take 1 tablet (0.6 mg total) by mouth daily as needed. Patient taking differently: Take 0.6 mg by mouth daily as needed (gout). 06/27/21   Amin, Ankit C, MD  DULoxetine (CYMBALTA) 30 MG capsule Take 30 mg by mouth daily.    [provider]  HUMALOG MIX 75/25 (75-25) 100 UNIT/ML SUSP injection Inject 100 Units into the skin 2 (two) times a day. 06/07/18   [provider]  HYDROcodone-acetaminophen (NORCO) 5-325 MG tablet Take 1 tablet by mouth every 6 (six) hours as needed for moderate pain. 05/21/22   Small, Brooke L, PA  JARDIANCE 25 MG TABS tablet Take 25 mg by mouth daily. 10/18/18   [provider]  naloxone [facility]) nasal spray 4 mg/0.1 mL Place 1 spray into the nose once. 06/12/21   [provider]  OZEMPIC, 0.25 OR 0.5 MG/DOSE, 2 MG/3ML SOPN Inject 0.5 mg as directed once a week. fridays 11/23/21   Kathlen Mody, MD      Allergies    Patient has no known allergies.  Review of Systems   Review of Systems  Physical Exam Updated Vital Signs BP (!) 159/96 (BP Location: Right Arm)   Pulse 89   Temp 98.2 F (36.8 C) (Oral)   Resp 18   Ht 6\' 3"  (1.905 m)   Wt 122.5 kg   SpO2 98%   BMI 33.75 kg/m  Physical Exam Vitals and nursing note reviewed.  Constitutional:      Appearance: He is well-developed.  HENT:     Head: Normocephalic and atraumatic.  Eyes:     Pupils: Pupils are equal, round, and reactive to light.  Neck:     Vascular: No JVD.  Cardiovascular:     Rate and Rhythm: Normal rate and regular rhythm.     Heart sounds: No murmur heard.    No friction rub. No gallop.  Pulmonary:     Effort: No respiratory distress.     Breath sounds: No wheezing.  Abdominal:     General: There is no distension.      Tenderness: There is no abdominal tenderness. There is no guarding or rebound.  Musculoskeletal:        General: Normal range of motion.     Cervical back: Normal range of motion and neck supple.     Comments: Positive Finkelstein test bilaterally worse on the left than the right.  No obvious edema to the wrist.  Able to range without significant discomfort.  Skin:    Coloration: Skin is not pale.     Findings: No rash.  Neurological:     Mental Status: He is alert and oriented to person, place, and time.  Psychiatric:        Behavior: Behavior normal.     ED Results / Procedures / Treatments   Labs (all labs ordered are listed, but only abnormal results are displayed) Labs Reviewed - No data to display  EKG None  Radiology No results found.  Procedures Procedures    Medications Ordered in ED Medications - No data to display  ED Course/ Medical Decision Making/ A&P                                 Medical Decision Making  50 yoM with a chief complaints of bilateral wrist pain.  Going on for couple days.  He is well-appearing nontoxic.  Positive Finkelstein test bilaterally.  Removable splints for comfort.  Tylenol and diclofenac gel.  PCP follow-up.  4:11 AM:  I have discussed the diagnosis/risks/treatment options with the patient.  Evaluation and diagnostic testing in the emergency department does not suggest an emergent condition requiring admission or immediate intervention beyond what has been performed at this time.  They will follow up with PCP. We also discussed returning to the ED immediately if new or worsening sx occur. We discussed the sx which are most concerning (e.g., sudden worsening pain, fever, inability to tolerate by mouth) that necessitate immediate return. Medications administered to the patient during their visit and any new prescriptions provided to the patient are listed below.  Medications given during this visit Medications - No data to  display   The patient appears reasonably screen and/or stabilized for discharge and I doubt any other medical condition or other [facility] requiring further screening, evaluation, or treatment in the ED at this time prior to discharge.          Final Clinical Impression(s) / ED Diagnoses Final diagnoses:  Tenosynovitis  Rx / DC Orders ED Discharge Orders          Ordered    diclofenac Sodium (VOLTAREN) 1 % GEL  4 times daily        11/22/22 0403              Melene Plan, DO 11/22/22 0411

## 2022-11-22 NOTE — ED Triage Notes (Signed)
Pt complaining of swelling and cramping to the left hand. Does have history of gout. York Spaniel he was diagnosed with strep on Wednesday and put on a strong antibiotic.

## 2022-11-22 NOTE — Discharge Instructions (Signed)
Use the gel as prescribed Also take tylenol 1017m(2 extra strength) four times a day.

## 2022-12-20 DIAGNOSIS — M25551 Pain in right hip: Secondary | ICD-10-CM | POA: Diagnosis not present

## 2022-12-20 DIAGNOSIS — L0293 Carbuncle, unspecified: Secondary | ICD-10-CM | POA: Diagnosis not present

## 2022-12-20 DIAGNOSIS — L02435 Carbuncle of right lower limb: Secondary | ICD-10-CM | POA: Diagnosis not present

## 2022-12-20 DIAGNOSIS — W57XXXA Bitten or stung by nonvenomous insect and other nonvenomous arthropods, initial encounter: Secondary | ICD-10-CM | POA: Diagnosis not present

## 2022-12-23 DIAGNOSIS — Z6836 Body mass index (BMI) 36.0-36.9, adult: Secondary | ICD-10-CM | POA: Diagnosis not present

## 2022-12-23 DIAGNOSIS — L0291 Cutaneous abscess, unspecified: Secondary | ICD-10-CM | POA: Diagnosis not present

## 2022-12-23 DIAGNOSIS — M109 Gout, unspecified: Secondary | ICD-10-CM | POA: Diagnosis not present

## 2022-12-31 DIAGNOSIS — I10 Essential (primary) hypertension: Secondary | ICD-10-CM | POA: Diagnosis not present

## 2022-12-31 DIAGNOSIS — M7021 Olecranon bursitis, right elbow: Secondary | ICD-10-CM | POA: Diagnosis not present

## 2022-12-31 DIAGNOSIS — Z79899 Other long term (current) drug therapy: Secondary | ICD-10-CM | POA: Diagnosis not present

## 2022-12-31 DIAGNOSIS — Z7984 Long term (current) use of oral hypoglycemic drugs: Secondary | ICD-10-CM | POA: Diagnosis not present

## 2022-12-31 DIAGNOSIS — Z7982 Long term (current) use of aspirin: Secondary | ICD-10-CM | POA: Diagnosis not present

## 2022-12-31 DIAGNOSIS — M25521 Pain in right elbow: Secondary | ICD-10-CM | POA: Diagnosis not present

## 2022-12-31 DIAGNOSIS — Z792 Long term (current) use of antibiotics: Secondary | ICD-10-CM | POA: Diagnosis not present

## 2023-01-18 ENCOUNTER — Emergency Department (HOSPITAL_COMMUNITY)
Admission: EM | Admit: 2023-01-18 | Discharge: 2023-01-18 | Disposition: A | Discharge status: Home / Self Care | Payer: BC Managed Care – PPO | Attending: Emergency Medicine | Admitting: Emergency Medicine

## 2023-01-18 ENCOUNTER — Other Ambulatory Visit: Payer: Self-pay

## 2023-01-18 ENCOUNTER — Emergency Department (HOSPITAL_COMMUNITY): Payer: BC Managed Care – PPO

## 2023-01-18 DIAGNOSIS — E039 Hypothyroidism, unspecified: Secondary | ICD-10-CM | POA: Diagnosis not present

## 2023-01-18 DIAGNOSIS — N1832 Chronic kidney disease, stage 3b: Secondary | ICD-10-CM | POA: Diagnosis not present

## 2023-01-18 DIAGNOSIS — M25421 Effusion, right elbow: Secondary | ICD-10-CM | POA: Diagnosis not present

## 2023-01-18 DIAGNOSIS — I129 Hypertensive chronic kidney disease with stage 1 through stage 4 chronic kidney disease, or unspecified chronic kidney disease: Secondary | ICD-10-CM | POA: Diagnosis not present

## 2023-01-18 DIAGNOSIS — E1122 Type 2 diabetes mellitus with diabetic chronic kidney disease: Secondary | ICD-10-CM | POA: Diagnosis not present

## 2023-01-18 DIAGNOSIS — Z79899 Other long term (current) drug therapy: Secondary | ICD-10-CM | POA: Insufficient documentation

## 2023-01-18 DIAGNOSIS — Z7984 Long term (current) use of oral hypoglycemic drugs: Secondary | ICD-10-CM | POA: Insufficient documentation

## 2023-01-18 DIAGNOSIS — Z8616 Personal history of COVID-19: Secondary | ICD-10-CM | POA: Diagnosis not present

## 2023-01-18 DIAGNOSIS — M7989 Other specified soft tissue disorders: Secondary | ICD-10-CM | POA: Diagnosis not present

## 2023-01-18 DIAGNOSIS — M7701 Medial epicondylitis, right elbow: Secondary | ICD-10-CM | POA: Diagnosis not present

## 2023-01-18 DIAGNOSIS — Z7982 Long term (current) use of aspirin: Secondary | ICD-10-CM | POA: Diagnosis not present

## 2023-01-18 DIAGNOSIS — M25521 Pain in right elbow: Secondary | ICD-10-CM | POA: Diagnosis not present

## 2023-01-18 LAB — CBC WITH DIFFERENTIAL/PLATELET
Abs Immature Granulocytes: 0.03 10*3/uL (ref 0.00–0.07)
Basophils Absolute: 0 10*3/uL (ref 0.0–0.1)
Basophils Relative: 0 %
Eosinophils Absolute: 0.5 10*3/uL (ref 0.0–0.5)
Eosinophils Relative: 5 %
HCT: 34.8 % — ABNORMAL LOW (ref 39.0–52.0)
Hemoglobin: 12.1 g/dL — ABNORMAL LOW (ref 13.0–17.0)
Immature Granulocytes: 0 %
Lymphocytes Relative: 23 %
Lymphs Abs: 2.4 10*3/uL (ref 0.7–4.0)
MCH: 31.4 pg (ref 26.0–34.0)
MCHC: 34.8 g/dL (ref 30.0–36.0)
MCV: 90.4 fL (ref 80.0–100.0)
Monocytes Absolute: 1 10*3/uL (ref 0.1–1.0)
Monocytes Relative: 10 %
Neutro Abs: 6.4 10*3/uL (ref 1.7–7.7)
Neutrophils Relative %: 62 %
Platelets: 281 10*3/uL (ref 150–400)
RBC: 3.85 MIL/uL — ABNORMAL LOW (ref 4.22–5.81)
RDW: 12.7 % (ref 11.5–15.5)
WBC: 10.3 10*3/uL (ref 4.0–10.5)
nRBC: 0 % (ref 0.0–0.2)

## 2023-01-18 LAB — BASIC METABOLIC PANEL
Anion gap: 10 (ref 5–15)
BUN: 32 mg/dL — ABNORMAL HIGH (ref 6–20)
CO2: 24 mmol/L (ref 22–32)
Calcium: 8.5 mg/dL — ABNORMAL LOW (ref 8.9–10.3)
Chloride: 101 mmol/L (ref 98–111)
Creatinine, Ser: 2.1 mg/dL — ABNORMAL HIGH (ref 0.61–1.24)
GFR, Estimated: 39 mL/min — ABNORMAL LOW (ref >60)
Glucose, Bld: 194 mg/dL — ABNORMAL HIGH (ref 70–99)
Potassium: 3.9 mmol/L (ref 3.5–5.1)
Sodium: 135 mmol/L (ref 135–145)

## 2023-01-18 MED ORDER — PREDNISONE 10 MG (21) PO TBPK: ORAL_TABLET | Freq: Every day | ORAL | 0 refills | Status: DC

## 2023-01-18 MED ORDER — IBUPROFEN 600 MG PO TABS: 600.0000 mg | ORAL_TABLET | Freq: Four times a day (QID) | ORAL | 0 refills | Status: DC | PRN

## 2023-01-18 MED ORDER — ACETAMINOPHEN 325 MG PO TABS
650.0000 mg | ORAL_TABLET | Freq: Once | ORAL | Status: AC
  Administered 2023-01-18: 650 mg via ORAL
  Filled 2023-01-18: qty 2

## 2023-01-18 MED ORDER — OXYCODONE-ACETAMINOPHEN 5-325 MG PO TABS
1.0000 | ORAL_TABLET | Freq: Once | ORAL | Status: AC
  Administered 2023-01-18: 1 via ORAL
  Filled 2023-01-18: qty 1

## 2023-01-18 MED ORDER — ACETAMINOPHEN 325 MG PO TABS: 650.0000 mg | ORAL_TABLET | Freq: Four times a day (QID) | ORAL | 0 refills | Status: AC | PRN

## 2023-01-18 MED ORDER — METHOCARBAMOL 500 MG PO TABS: 1000.0000 mg | ORAL_TABLET | Freq: Two times a day (BID) | ORAL | 0 refills | Status: AC

## 2023-01-18 MED ORDER — OXYCODONE HCL 5 MG PO TABS: 5.0000 mg | ORAL_TABLET | ORAL | 0 refills | Status: DC | PRN

## 2023-01-18 MED ORDER — KETOROLAC TROMETHAMINE 15 MG/ML IJ SOLN
15.0000 mg | Freq: Once | INTRAMUSCULAR | Status: AC
  Administered 2023-01-18: 15 mg via INTRAMUSCULAR
  Filled 2023-01-18: qty 1

## 2023-01-18 NOTE — ED Triage Notes (Signed)
Pt arrived via POV. C/o R arm pain from elbow to hand for 3x weeks. Swelling noted.

## 2023-01-18 NOTE — Discharge Instructions (Addendum)
It was a pleasure caring for you today in the emergency department.  Please follow up with orthopedic specialist Dr Merlyn Lot  Please return to the emergency department for any worsening or worrisome symptoms.

## 2023-01-18 NOTE — ED Provider Notes (Signed)
[place] EMERGENCY DEPARTMENT AT [facility] Provider Note  CSN: [id number] Arrival date & time: 01/18/23 0107  Chief Complaint(s) Arm Pain  HPI Travez Yaccarino is a 47 y.o. male with past medical history as below, significant for carpal tunnel syndrome, DM, HLD, HTN, neuropathy, gout, obesity, CKD who presents to the ED with complaint of right arm pain and swelling, ongoing around 3 weeks.  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  He tried using a sling for a few days which did not provide much relief.  He has been taking OTC analgesics intermittent without much relief.  He has some tingling to his fifth digit on his right hand.  Pain along the medial aspect of his right forearm and right elbow.  No fevers or chills, no recent falls or injuries, no lacerations or rash.    Past Medical History Past Medical History:  Diagnosis Date   Carpal tunnel syndrome    Diabetes mellitus without complication (HCC)    Gout    Hypercholesteremia    Hypertension    Hypothyroidism    Neuropathy    Obesity    Thyroid disease    Patient Active Problem List   Diagnosis Date Noted   Biliary colic 11/21/2021   Acute pancreatitis 11/20/2021   Cellulitis of left lower extremity 06/22/2021   Sepsis (HCC) 06/22/2021   Gout flare 06/22/2021   Acute osteomyelitis of left foot (HCC) 06/10/2021   Leukocytosis 06/10/2021   Diabetic ulcer of left foot associated with type 2 diabetes mellitus (HCC) 06/09/2021   Class 3 obesity 06/09/2021   Type 2 diabetes mellitus with hyperglycemia (HCC) 06/09/2021   Acute kidney injury superimposed on chronic kidney disease (HCC) 02/14/2021   Atypical chest pain 02/14/2021   Cellulitis and abscess of foot 02/14/2021   Cholelithiasis 02/14/2021   Chronic cholecystitis with calculus 02/14/2021   Dependent edema 02/14/2021   DKA (diabetic ketoacidosis) (HCC) 02/14/2021   Failure of outpatient treatment 02/14/2021   Fracture of  right great toe 02/14/2021   Hyponatremia 02/14/2021   Noncompliance with medication regimen 02/14/2021   Knee effusion, left 02/14/2021   Pneumonia 02/14/2021   Retrograde ejaculation 12/12/2019   AKI (acute kidney injury) (HCC) 08/05/2018   Stage 3b chronic kidney disease (CKD) (HCC) 08/05/2018   Hyperosmolar non-ketotic state in patient with type 2 diabetes mellitus (HCC) 08/04/2018   Pneumonia due to COVID-19 virus 08/04/2018   Pain in joint of left elbow 07/02/2018   Pain in left knee 07/02/2018   Pain of left hand 03/20/2018   Thoracic radiculopathy 05/01/2015   Chronic allergic conjunctivitis 04/20/2015   Congenital cataract 04/20/2015   Diabetic cataract (HCC) 04/20/2015   Dry eyes, bilateral 04/20/2015   Myopia of both eyes 04/20/2015   Paronychia 04/20/2015   Carpal tunnel syndrome, left 03/29/2015   Dysuria 03/21/2015   Arm numbness left 03/20/2015   Diabetic neuropathy (HCC) 03/20/2015   Neck pain 03/20/2015   Hypogonadism male 03/19/2015   Morbid obesity with BMI of 40.0-44.9, adult (HCC) 03/19/2015   Burning sensation of feet 03/16/2015   Common migraine 03/16/2015   Degenerative disc disease, lumbar 03/16/2015   Depression 03/16/2015   Hypertension 03/16/2015   Low back pain 03/16/2015   OSA (obstructive sleep apnea) 03/16/2015   DM2 (diabetes mellitus, type 2) (HCC) 03/16/2015   Hypothyroidism 05/31/2008   HLD (hyperlipidemia) 05/31/2008   OBESITY, MORBID 05/31/2008   MYOCARDIAL PERFUSION SCAN, WITH STRESS TEST, ABNORMAL 05/31/2008   Dyslipidemia 05/31/2008  Home Medication(s) Prior to Admission medications   Medication Sig Start Date End Date Taking? Authorizing Provider  acetaminophen (TYLENOL) 325 MG tablet Take 2 tablets (650 mg total) by mouth every 6 (six) hours as needed. 01/18/23  Yes Tanda Rockers A, DO  ibuprofen (ADVIL) 600 MG tablet Take 1 tablet (600 mg total) by mouth every 6 (six) hours as needed. 01/18/23  Yes Sloan Leiter, DO   methocarbamol (ROBAXIN) 500 MG tablet Take 2 tablets (1,000 mg total) by mouth 2 (two) times daily for 5 days. 01/18/23 01/23/23 Yes Tanda Rockers A, DO  oxyCODONE (ROXICODONE) 5 MG immediate release tablet Take 1 tablet (5 mg total) by mouth every 4 (four) hours as needed for severe pain (pain score 7-10). 01/18/23  Yes Tanda Rockers A, DO  predniSONE (STERAPRED UNI-PAK 21 TAB) 10 MG (21) TBPK tablet Take by mouth daily. Take 6 tabs by mouth daily  for 2 days, then 5 tabs for 2 days, then 4 tabs for 2 days, then 3 tabs for 2 days, 2 tabs for 2 days, then 1 tab by mouth daily for 2 days 01/18/23  Yes Tanda Rockers A, DO  allopurinol (ZYLOPRIM) 300 MG tablet Take 1 tablet (300 mg total) by mouth daily. 11/23/21   Kathlen Mody, MD  amLODipine (NORVASC) 10 MG tablet Take 1 tablet (10 mg total) by mouth daily. 11/23/21 02/21/22  Kathlen Mody, MD  amoxicillin-clavulanate (AUGMENTIN) 875-125 MG tablet Take 1 tablet by mouth every 12 (twelve) hours. 08/03/22   Small, Brooke L, PA  aspirin 81 MG chewable tablet Chew 1 tablet (81 mg total) by mouth daily. 11/23/21   Kathlen Mody, MD  atorvastatin (LIPITOR) 80 MG tablet Take 1 tablet (80 mg total) by mouth daily. 11/23/21   Kathlen Mody, MD  benzocaine (ORAJEL) 10 % mucosal gel Use as directed 1 Application in the mouth or throat as needed for mouth pain. 08/03/22   Small, Brooke L, PA  Calcium Carbonate Antacid (TUMS PO) Take 2 tablets by mouth daily as needed (reflux).    [provider]  Carboxymethylcellulose Sodium (EYE DROPS OP) Place 2 drops into both eyes daily as needed (dry eye).    [provider]  ciprofloxacin-dexamethasone (CIPRODEX) OTIC suspension Place 4 drops into the right ear 2 (two) times daily. 08/12/22   Edwin Dada P, DO  colchicine 0.6 MG tablet Take 1 tablet (0.6 mg total) by mouth daily as needed. Patient taking differently: Take 0.6 mg by mouth daily as needed (gout). 06/27/21   Amin, Ankit C, MD  diclofenac Sodium (VOLTAREN)  1 % GEL Apply 4 g topically 4 (four) times daily. 11/22/22   Melene Plan, DO  DULoxetine (CYMBALTA) 30 MG capsule Take 30 mg by mouth daily.    [provider]  HUMALOG MIX 75/25 (75-25) 100 UNIT/ML SUSP injection Inject 100 Units into the skin 2 (two) times a day. 06/07/18   [provider]  JARDIANCE 25 MG TABS tablet Take 25 mg by mouth daily. 10/18/18   [provider]  naloxone [facility]) nasal spray 4 mg/0.1 mL Place 1 spray into the nose once. 06/12/21   [provider]  OZEMPIC, 0.25 OR 0.5 MG/DOSE, 2 MG/3ML SOPN Inject 0.5 mg as directed once a week. fridays 11/23/21   Kathlen Mody, MD  Past Surgical History Past Surgical History:  Procedure Laterality Date   AMPUTATION Left 06/10/2021   Procedure: 5th RAY AMPUTATION LEFT FOOT;  Surgeon: [name], DPM;  Location: [facility];  Service: Podiatry;  Laterality: Left;   CHOLECYSTECTOMY N/A 11/21/2021   Procedure: LAPAROSCOPIC CHOLECYSTECTOMY;  Surgeon: Quentin Ore, MD;  Location: [facility];  Service: General;  Laterality: N/A;   FRACTURE SURGERY     INCISE AND DRAIN ABCESS     IRRIGATION AND DEBRIDEMENT FOOT Right 04/27/2019   Procedure: Debridement and Irrigation with possible placement of antibiotic beads;  Surgeon: Park Liter, DPM;  Location: [facility];  Service: Podiatry;  Laterality: Right;   ORIF TOE FRACTURE Right 04/27/2019   Procedure: Open Reduction vs Excision of Fracture Fragment Right great toe, with pinning.;  Surgeon: Park Liter, DPM;  Location: [facility];  Service: Podiatry;  Laterality: Right;   TONSILLECTOMY     Family History No family history on file.  Social History Social History   Tobacco Use   Smoking status: Never   Smokeless tobacco: Never  Vaping Use   Vaping status: Never Used  Substance Use Topics   Alcohol use: Yes    Comment: rare    Drug use: Yes    Types: Marijuana    Comment: rare   Allergies Patient has no known allergies.  Review of Systems Review of Systems  Constitutional:  Negative for chills and fever.  Respiratory:  Negative for chest tightness and shortness of breath.   Cardiovascular:  Negative for chest pain and palpitations.  Gastrointestinal:  Negative for abdominal pain.  Genitourinary:  Negative for difficulty urinating and dysuria.  Musculoskeletal:  Positive for arthralgias and myalgias.  Skin:  Negative for rash and wound.  Neurological:  Negative for syncope and weakness.  All other systems reviewed and are negative.   Physical Exam Vital Signs  I have reviewed the triage vital signs BP (!) 167/104 (BP Location: Left Arm)   Pulse (!) 107   Temp 98 F (36.7 C) (Oral)   Resp 18   Ht 6\' 3"  (1.905 m)   Wt 124.7 kg   BMI 34.37 kg/m  Physical Exam Vitals and nursing note reviewed.  Constitutional:      General: He is not in acute distress.    Appearance: Normal appearance. He is well-developed. He is not ill-appearing.  HENT:     Head: Normocephalic and atraumatic.     Right Ear: External ear normal.     Left Ear: External ear normal.     Nose: Nose normal.     Mouth/Throat:     Mouth: Mucous membranes are moist.  Eyes:     General: No scleral icterus.       Right eye: No discharge.        Left eye: No discharge.  Cardiovascular:     Rate and Rhythm: Normal rate.  Pulmonary:     Effort: Pulmonary effort is normal. No respiratory distress.     Breath sounds: No stridor.  Abdominal:     General: Abdomen is flat. There is no distension.     Tenderness: There is no guarding.  Musculoskeletal:        General: No deformity.       Arms:     Cervical back: No rigidity.     Comments: Upper extremities are NVI.  He has some paresthesias noted to right fifth digit.  Cap refill is brisk.  No rashes or evidence of  cellulitis.  Minimal joint effusion noted to right elbow.    Skin:     General: Skin is warm and dry.     Coloration: Skin is not cyanotic, jaundiced or pale.  Neurological:     Mental Status: He is alert and oriented to person, place, and time.     GCS: GCS eye subscore is 4. GCS verbal subscore is 5. GCS motor subscore is 6.  Psychiatric:        Speech: Speech normal.        Behavior: Behavior normal. Behavior is cooperative.     ED Results and Treatments Labs (all labs ordered are listed, but only abnormal results are displayed) Labs Reviewed  CBC WITH DIFFERENTIAL/PLATELET - Abnormal; Notable for the following components:      Result Value   RBC 3.85 (*)    Hemoglobin 12.1 (*)    HCT 34.8 (*)    All other components within normal limits  BASIC METABOLIC PANEL - Abnormal; Notable for the following components:   Glucose, Bld 194 (*)    BUN 32 (*)    Creatinine, Ser 2.10 (*)    Calcium 8.5 (*)    GFR, Estimated 39 (*)    All other components within normal limits                                                                                                                          Radiology DG Elbow Complete Right Result Date: 01/18/2023 CLINICAL DATA:  Elbow pain and swelling for 3 weeks. EXAM: RIGHT ELBOW - COMPLETE 3+ VIEW COMPARISON:  None Available. FINDINGS: There is no evidence of fracture or dislocation. There is no evidence of arthropathy or other focal bone abnormality. No erosive change. Generalized subcutaneous edema. Small elbow joint effusion. No tissue gas or radiopaque foreign body IMPRESSION: Generalized subcutaneous edema and small elbow joint effusion. No acute osseous abnormality. Electronically Signed   By: Narda Rutherford M.D.   On: 01/18/2023 03:32    Pertinent labs & imaging results that were available during my care of the patient were reviewed by me and considered in my medical decision making (see MDM for details).  Medications Ordered in ED Medications  oxyCODONE-acetaminophen (PERCOCET/ROXICET) 5-325 MG per tablet  1 tablet (1 tablet Oral Given 01/18/23 0250)  ketorolac (TORADOL) 15 MG/ML injection 15 mg (15 mg Intramuscular Given 01/18/23 0500)  acetaminophen (TYLENOL) tablet 650 mg (650 mg Oral Given 01/18/23 0500)  Procedures Procedures  (including critical care time)  Medical Decision Making / ED Course    Medical Decision Making:    Stefone Sherling is a 47 y.o. male with past medical history as below, significant for carpal tunnel syndrome, DM, HLD, HTN, neuropathy, gout, obesity, CKD who presents to the ED with complaint of right arm pain and swelling. The complaint involves an extensive differential diagnosis and also carries with it a high risk of complications and morbidity.  Serious etiology was considered. Ddx includes but is not limited to: Sprain, strain, soft tissue injury, ligamentous injury, tendinopathy, etc.  Complete initial physical exam performed, notably the patient was in distress.    Reviewed and confirmed nursing documentation for past medical history, family history, social history.  Vital signs reviewed.      Clinical Course as of 01/18/23 0524  Sun Jan 18, 2023  0307 Creatinine(!): 2.10 Similar to baseline [SG]  0307 WBC: 10.3 [SG]  0307 Temp: 98 F (36.7 C) [SG]  0453 He has sling at home  Pain improved, labs stable [SG]  0455 XR reviewed,  [SG]    Clinical Course User Index [SG] Sloan Leiter, DO    Brief summary: 47 year old male history above here with right elbow pain x 3 weeks.  Worsened after heavy workload the past week or so, works in [employer].  Primarily right elbow medial aspect, radiation down to his right fifth digit.  Right upper extremity is NVI.  X-ray reviewed does show small joint effusion and some edema.  No evidence of cellulitis on exam.  Low suspicion for septic joint given duration of symptoms, no  fever, no white count, nontoxic appearance.  concern possible medial epicondylitis  Pain greatly improved, he has sling at home, provide Ace wrap.  Outpatient follow with hand ortho.  Work note Provided.  The patient improved significantly and was discharged in stable condition. Detailed discussions were had with the patient regarding current findings, and need for close f/u with PCP or on call doctor. The patient has been instructed to return immediately if the symptoms worsen in any way for re-evaluation. Patient verbalized understanding and is in agreement with current care plan. All questions answered prior to discharge.                  Additional history obtained: -Additional history obtained from spouse -External records from outside source obtained and reviewed including: Chart review including previous notes, labs, imaging, consultation notes including  Prior ED visits, home medications   Lab Tests: -I ordered, reviewed, and interpreted labs.   The pertinent results include:   Labs Reviewed  CBC WITH DIFFERENTIAL/PLATELET - Abnormal; Notable for the following components:      Result Value   RBC 3.85 (*)    Hemoglobin 12.1 (*)    HCT 34.8 (*)    All other components within normal limits  BASIC METABOLIC PANEL - Abnormal; Notable for the following components:   Glucose, Bld 194 (*)    BUN 32 (*)    Creatinine, Ser 2.10 (*)    Calcium 8.5 (*)    GFR, Estimated 39 (*)    All other components within normal limits    Notable for labs stable  EKG   EKG Interpretation Date/Time:    Ventricular Rate:    PR Interval:    QRS Duration:    QT Interval:    QTC Calculation:   R Axis:      Text Interpretation:  Imaging Studies ordered: I ordered imaging studies including elbow XR I independently visualized the following imaging with scope of interpretation limited to determining acute life threatening conditions related to emergency care;  findings noted above I independently visualized and interpreted imaging. I agree with the radiologist interpretation   Medicines ordered and prescription drug management: Meds ordered this encounter  Medications   oxyCODONE-acetaminophen (PERCOCET/ROXICET) 5-325 MG per tablet 1 tablet    Refill:  0   oxyCODONE (ROXICODONE) 5 MG immediate release tablet    Sig: Take 1 tablet (5 mg total) by mouth every 4 (four) hours as needed for severe pain (pain score 7-10).    Dispense:  10 tablet    Refill:  0   acetaminophen (TYLENOL) 325 MG tablet    Sig: Take 2 tablets (650 mg total) by mouth every 6 (six) hours as needed.    Dispense:  36 tablet    Refill:  0   ibuprofen (ADVIL) 600 MG tablet    Sig: Take 1 tablet (600 mg total) by mouth every 6 (six) hours as needed.    Dispense:  30 tablet    Refill:  0   methocarbamol (ROBAXIN) 500 MG tablet    Sig: Take 2 tablets (1,000 mg total) by mouth 2 (two) times daily for 5 days.    Dispense:  20 tablet    Refill:  0   ketorolac (TORADOL) 15 MG/ML injection 15 mg   acetaminophen (TYLENOL) tablet 650 mg   predniSONE (STERAPRED UNI-PAK 21 TAB) 10 MG (21) TBPK tablet    Sig: Take by mouth daily. Take 6 tabs by mouth daily  for 2 days, then 5 tabs for 2 days, then 4 tabs for 2 days, then 3 tabs for 2 days, 2 tabs for 2 days, then 1 tab by mouth daily for 2 days    Dispense:  42 tablet    Refill:  0    -I have reviewed the patients home medicines and have made adjustments as needed   Consultations Obtained: na   Cardiac Monitoring: Continuous pulse oximetry interpreted by myself, 99% on RA.    Social Determinants of Health:  Diagnosis or treatment significantly limited by social determinants of health: obesity   Reevaluation: After the interventions noted above, I reevaluated the patient and found that they have improved  Co morbidities that complicate the patient evaluation  Past Medical History:  Diagnosis Date   Carpal tunnel  syndrome    Diabetes mellitus without complication (HCC)    Gout    Hypercholesteremia    Hypertension    Hypothyroidism    Neuropathy    Obesity    Thyroid disease       Dispostion: Disposition decision including need for hospitalization was considered, and patient discharged from emergency department.    Final Clinical Impression(s) / ED Diagnoses Final diagnoses:  Medial epicondylitis of right elbow        Sloan Leiter, DO 01/18/23 9629

## 2023-07-06 ENCOUNTER — Emergency Department (HOSPITAL_COMMUNITY)
Admission: EM | Admit: 2023-07-06 | Discharge: 2023-07-06 | Disposition: A | Discharge status: Home / Self Care | Attending: Emergency Medicine | Admitting: Emergency Medicine

## 2023-07-06 ENCOUNTER — Encounter (HOSPITAL_COMMUNITY): Payer: Self-pay

## 2023-07-06 DIAGNOSIS — M67431 Ganglion, right wrist: Secondary | ICD-10-CM

## 2023-07-06 DIAGNOSIS — M79604 Pain in right leg: Secondary | ICD-10-CM

## 2023-07-06 DIAGNOSIS — M7989 Other specified soft tissue disorders: Secondary | ICD-10-CM | POA: Diagnosis present

## 2023-07-06 DIAGNOSIS — M79662 Pain in left lower leg: Secondary | ICD-10-CM | POA: Insufficient documentation

## 2023-07-06 DIAGNOSIS — Z79899 Other long term (current) drug therapy: Secondary | ICD-10-CM | POA: Insufficient documentation

## 2023-07-06 DIAGNOSIS — M79661 Pain in right lower leg: Secondary | ICD-10-CM | POA: Diagnosis not present

## 2023-07-06 DIAGNOSIS — M67441 Ganglion, right hand: Secondary | ICD-10-CM | POA: Diagnosis not present

## 2023-07-06 DIAGNOSIS — Z7982 Long term (current) use of aspirin: Secondary | ICD-10-CM | POA: Insufficient documentation

## 2023-07-06 LAB — CBC WITH DIFFERENTIAL/PLATELET
Abs Immature Granulocytes: 0.02 10*3/uL (ref 0.00–0.07)
Basophils Absolute: 0 10*3/uL (ref 0.0–0.1)
Basophils Relative: 0 %
Eosinophils Absolute: 0 10*3/uL (ref 0.0–0.5)
Eosinophils Relative: 0 %
HCT: 38.6 % — ABNORMAL LOW (ref 39.0–52.0)
Hemoglobin: 13.1 g/dL (ref 13.0–17.0)
Immature Granulocytes: 0 %
Lymphocytes Relative: 13 %
Lymphs Abs: 1 10*3/uL (ref 0.7–4.0)
MCH: 30.9 pg (ref 26.0–34.0)
MCHC: 33.9 g/dL (ref 30.0–36.0)
MCV: 91 fL (ref 80.0–100.0)
Monocytes Absolute: 0.5 10*3/uL (ref 0.1–1.0)
Monocytes Relative: 6 %
Neutro Abs: 6.4 10*3/uL (ref 1.7–7.7)
Neutrophils Relative %: 81 %
Platelets: 288 10*3/uL (ref 150–400)
RBC: 4.24 MIL/uL (ref 4.22–5.81)
RDW: 12.5 % (ref 11.5–15.5)
WBC: 8 10*3/uL (ref 4.0–10.5)
nRBC: 0 % (ref 0.0–0.2)

## 2023-07-06 LAB — COMPREHENSIVE METABOLIC PANEL WITH GFR
ALT: 13 U/L (ref 0–44)
AST: 16 U/L (ref 15–41)
Albumin: 3.4 g/dL — ABNORMAL LOW (ref 3.5–5.0)
Alkaline Phosphatase: 49 U/L (ref 38–126)
Anion gap: 7 (ref 5–15)
BUN: 29 mg/dL — ABNORMAL HIGH (ref 6–20)
CO2: 24 mmol/L (ref 22–32)
Calcium: 8.8 mg/dL — ABNORMAL LOW (ref 8.9–10.3)
Chloride: 106 mmol/L (ref 98–111)
Creatinine, Ser: 2.63 mg/dL — ABNORMAL HIGH (ref 0.61–1.24)
GFR, Estimated: 30 mL/min — ABNORMAL LOW (ref >60)
Glucose, Bld: 181 mg/dL — ABNORMAL HIGH (ref 70–99)
Potassium: 4.8 mmol/L (ref 3.5–5.1)
Sodium: 137 mmol/L (ref 135–145)
Total Bilirubin: 1.1 mg/dL (ref 0.0–1.2)
Total Protein: 7.3 g/dL (ref 6.5–8.1)

## 2023-07-06 NOTE — ED Provider Notes (Signed)
 [place] EMERGENCY DEPARTMENT AT [facility] Provider Note   CSN: [id number] Arrival date & time: 07/06/23  7829     Patient presents with: Leg Swelling and Gout   Barry [name] is a 48 y.o. male.   The history is provided by the patient and medical records.   48 year old male with history of chronic kidney disease stage 3, obesity, diabetes, peripheral neuropathy, gout, presenting to the ED for various complaints.  States seen by PCP last week for some aches/pains in his wrist and legs.  Had an elevated uric acid level and was started on medications for gout.  States he has been trying to avoid red meat but does eat some fish.  States since then he feels like his legs are swelling.  He does work for [employer] so is on his feet a lot.  Does admit to drinking some energy drinks and electrolyte replacement packets since he is out in the heat.  He denies chest pain or SOB.  He does have concern about his kidney function and was worried that may be contributing.  Prior to Admission medications   Medication Sig Start Date End Date Taking? Authorizing Provider  acetaminophen  (TYLENOL ) 325 MG tablet Take 2 tablets (650 mg total) by mouth every 6 (six) hours as needed. 01/18/23   Teddi Favors, DO  allopurinol  (ZYLOPRIM ) 300 MG tablet Take 1 tablet (300 mg total) by mouth daily. 11/23/21   Akula, Vijaya, MD  amLODipine  (NORVASC ) 10 MG tablet Take 1 tablet (10 mg total) by mouth daily. 11/23/21 02/21/22  Akula, Vijaya, MD  amoxicillin -clavulanate (AUGMENTIN ) 875-125 MG tablet Take 1 tablet by mouth every 12 (twelve) hours. 08/03/22   Small, Brooke L, PA  aspirin  81 MG chewable tablet Chew 1 tablet (81 mg total) by mouth daily. 11/23/21   Akula, Vijaya, MD  atorvastatin  (LIPITOR) 80 MG tablet Take 1 tablet (80 mg total) by mouth daily. 11/23/21   Akula, Vijaya, MD  benzocaine  (ORAJEL) 10 % mucosal gel Use as directed 1 Application in the mouth or throat as needed for mouth pain. 08/03/22   Small,  Brooke L, PA  Calcium  Carbonate Antacid (TUMS PO) Take 2 tablets by mouth daily as needed (reflux).    [provider]  Carboxymethylcellulose Sodium (EYE DROPS OP) Place 2 drops into both eyes daily as needed (dry eye).    [provider]  ciprofloxacin -dexamethasone  (CIPRODEX ) OTIC suspension Place 4 drops into the right ear 2 (two) times daily. 08/12/22   Owen Blowers P, DO  colchicine  0.6 MG tablet Take 1 tablet (0.6 mg total) by mouth daily as needed. Patient taking differently: Take 0.6 mg by mouth daily as needed (gout). 06/27/21   Amin, Ankit C, MD  diclofenac  Sodium (VOLTAREN ) 1 % GEL Apply 4 g topically 4 (four) times daily. 11/22/22   Albertus Hughs, DO  DULoxetine  (CYMBALTA ) 30 MG capsule Take 30 mg by mouth daily.    [provider]  HUMALOG MIX 75/25 (75-25) 100 UNIT/ML SUSP injection Inject 100 Units into the skin 2 (two) times a day. 06/07/18   [provider]  ibuprofen  (ADVIL ) 600 MG tablet Take 1 tablet (600 mg total) by mouth every 6 (six) hours as needed. 01/18/23   Russella Courts A, DO  JARDIANCE  25 MG TABS tablet Take 25 mg by mouth daily. 10/18/18   [provider]  naloxone  (NARCAN ) nasal spray 4 mg/0.1 mL Place 1 spray into the nose once. 06/12/21   [provider]  oxyCODONE  (ROXICODONE ) 5 MG immediate release tablet Take 1 tablet (5 mg total) by mouth every 4 (four) hours as needed for severe pain (pain score 7-10). 01/18/23   Teddi Favors, DO  OZEMPIC , 0.25 OR 0.5 MG/DOSE, 2 MG/3ML SOPN Inject 0.5 mg as directed once a week. fridays 11/23/21   Akula, Vijaya, MD  predniSONE  (STERAPRED UNI-PAK 21 TAB) 10 MG (21) TBPK tablet Take by mouth daily. Take 6 tabs by mouth daily  for 2 days, then 5 tabs for 2 days, then 4 tabs for 2 days, then 3 tabs for 2 days, 2 tabs for 2 days, then 1 tab by mouth daily for 2 days 01/18/23   Teddi Favors, DO    Allergies: Patient has no known allergies.    Review of Systems  Musculoskeletal:   Positive for arthralgias.  All other systems reviewed and are negative.   Updated Vital Signs BP (!) 165/99 (BP Location: Right Arm)   Pulse 94   Temp 98.2 F (36.8 C) (Oral)   Resp 14   SpO2 98%   Physical Exam Vitals and nursing note reviewed.  Constitutional:      Appearance: He is well-developed.  HENT:     Head: Normocephalic and atraumatic.   Eyes:     Conjunctiva/sclera: Conjunctivae normal.     Pupils: Pupils are equal, round, and reactive to light.    Cardiovascular:     Rate and Rhythm: Normal rate and regular rhythm.     Heart sounds: Normal heart sounds.  Pulmonary:     Effort: Pulmonary effort is normal.     Breath sounds: Normal breath sounds.     Comments: Lungs CTAB, no distress Abdominal:     General: Bowel sounds are normal.     Palpations: Abdomen is soft.   Musculoskeletal:        General: Normal range of motion.     Cervical back: Normal range of motion.     Comments: Right dorsal hand has what appears to be small ganglion cyst, locally tender, there is no surrounding erythema/induration, no drainage, normal ROM of fingers, normal sensation  Legs without noted edema, no erythema/induration, ambulatory   Skin:    General: Skin is warm and dry.   Neurological:     Mental Status: He is alert and oriented to person, place, and time.     (all labs ordered are listed, but only abnormal results are displayed) Labs Reviewed  CBC WITH DIFFERENTIAL/PLATELET - Abnormal; Notable for the following components:      Result Value   HCT 38.6 (*)    All other components within normal limits  COMPREHENSIVE METABOLIC PANEL WITH GFR - Abnormal; Notable for the following components:   Glucose, Bld 181 (*)    BUN 29 (*)    Creatinine, Ser 2.63 (*)    Calcium  8.8 (*)    Albumin 3.4 (*)    GFR, Estimated 30 (*)    All other components within normal limits    EKG: None  Radiology: No results found.   Procedures   Medications Ordered in the ED - No  data to display                                  Medical Decision Making Amount and/or Complexity of Data Reviewed Labs: ordered. ECG/medicine tests: ordered and independent interpretation performed.   48 year old male presenting to the ED with multiple  concerns.  Reports a knot in the back of right hand, recent gout, and some swelling of his legs.  Has longstanding peripheral neuropathy, better after being started on Lyrica .  He is afebrile and nontoxic in appearance here.  Knot on back of his hand seems consistent with a ganglion cyst.  There is no overlying erythema or induration.  No signs of infection.  Maintains normal movement and grip strength.  He reports slight leg swelling but I do not appreciate any of this on exam.  His lungs are clear without wheezes or rhonchi and he denies any shortness of breath.  Recent uric acid was elevated but denies any specific joint pain-- seems mostly has pain throughout his body and legs.  Less consistent with acute gout flare, this may be chronic due to his renal function.  Labs today without leukocytosis.  Renal function is just slightly above baseline (usually 1.8 - 2.4 based on prior values).  No significant electrolyte derangement.  He is resting comfortably.  Maintains stable vitals on room air.  Does not appear to be an emergent process today.  Feel he is stable for discharge and can follow-up with PCP.  He did request to see someone about possibly getting cyst excised, referral to hand surgery given.  Can return here for new concerns.  Final diagnoses:  Pain in both lower extremities  Ganglion cyst of finger of right hand    ED Discharge Orders     None          Coretha Dew, PA-C 07/06/23 0981    Alissa April, MD 07/06/23 [phone]

## 2023-07-06 NOTE — ED Triage Notes (Signed)
 Pt states that he has a lump on the back of his R hand for the past week, went to see his PCP and was given gout meds, swelling in his R hand has become worse and now he has swelling in both of his legs. Denies SOB

## 2023-07-06 NOTE — Discharge Instructions (Addendum)
 Your labs today were overall reassuring-- renal function is just above baseline so we do need to keep an eye on this. Can follow-up with the hand specialist if you want to see about getting cyst excised. Follow-up with your primary care doctor. Return here for new concerns.

## 2023-09-18 ENCOUNTER — Emergency Department (HOSPITAL_COMMUNITY)

## 2023-09-18 ENCOUNTER — Encounter (HOSPITAL_COMMUNITY): Payer: Self-pay | Admitting: Emergency Medicine

## 2023-09-18 ENCOUNTER — Other Ambulatory Visit: Payer: Self-pay

## 2023-09-18 ENCOUNTER — Ambulatory Visit (HOSPITAL_BASED_OUTPATIENT_CLINIC_OR_DEPARTMENT_OTHER): Admission: EM | Admit: 2023-09-18 | Discharge: 2023-09-18 | Disposition: A | Discharge status: Home / Self Care

## 2023-09-18 ENCOUNTER — Inpatient Hospital Stay (HOSPITAL_COMMUNITY)
Admission: EM | Admit: 2023-09-18 | Discharge: 2023-09-22 | DRG: 478 | Disposition: A | Discharge status: Home / Self Care | Source: Ambulatory Visit | Attending: Internal Medicine | Admitting: Internal Medicine

## 2023-09-18 ENCOUNTER — Encounter (HOSPITAL_BASED_OUTPATIENT_CLINIC_OR_DEPARTMENT_OTHER): Payer: Self-pay

## 2023-09-18 DIAGNOSIS — M869 Osteomyelitis, unspecified: Secondary | ICD-10-CM

## 2023-09-18 DIAGNOSIS — E11621 Type 2 diabetes mellitus with foot ulcer: Secondary | ICD-10-CM | POA: Diagnosis present

## 2023-09-18 DIAGNOSIS — L089 Local infection of the skin and subcutaneous tissue, unspecified: Secondary | ICD-10-CM | POA: Diagnosis present

## 2023-09-18 DIAGNOSIS — E1169 Type 2 diabetes mellitus with other specified complication: Secondary | ICD-10-CM

## 2023-09-18 DIAGNOSIS — E1122 Type 2 diabetes mellitus with diabetic chronic kidney disease: Secondary | ICD-10-CM | POA: Diagnosis present

## 2023-09-18 DIAGNOSIS — Z7982 Long term (current) use of aspirin: Secondary | ICD-10-CM

## 2023-09-18 DIAGNOSIS — I129 Hypertensive chronic kidney disease with stage 1 through stage 4 chronic kidney disease, or unspecified chronic kidney disease: Secondary | ICD-10-CM | POA: Diagnosis present

## 2023-09-18 DIAGNOSIS — E1152 Type 2 diabetes mellitus with diabetic peripheral angiopathy with gangrene: Principal | ICD-10-CM | POA: Diagnosis present

## 2023-09-18 DIAGNOSIS — L03031 Cellulitis of right toe: Secondary | ICD-10-CM

## 2023-09-18 DIAGNOSIS — E039 Hypothyroidism, unspecified: Secondary | ICD-10-CM | POA: Diagnosis present

## 2023-09-18 DIAGNOSIS — E1143 Type 2 diabetes mellitus with diabetic autonomic (poly)neuropathy: Secondary | ICD-10-CM | POA: Diagnosis not present

## 2023-09-18 DIAGNOSIS — E66811 Obesity, class 1: Secondary | ICD-10-CM | POA: Diagnosis present

## 2023-09-18 DIAGNOSIS — M86671 Other chronic osteomyelitis, right ankle and foot: Secondary | ICD-10-CM | POA: Diagnosis present

## 2023-09-18 DIAGNOSIS — M009 Pyogenic arthritis, unspecified: Secondary | ICD-10-CM | POA: Diagnosis not present

## 2023-09-18 DIAGNOSIS — E7800 Pure hypercholesterolemia, unspecified: Secondary | ICD-10-CM | POA: Diagnosis present

## 2023-09-18 DIAGNOSIS — L97519 Non-pressure chronic ulcer of other part of right foot with unspecified severity: Secondary | ICD-10-CM | POA: Diagnosis present

## 2023-09-18 DIAGNOSIS — K047 Periapical abscess without sinus: Secondary | ICD-10-CM

## 2023-09-18 DIAGNOSIS — N1832 Chronic kidney disease, stage 3b: Secondary | ICD-10-CM | POA: Diagnosis present

## 2023-09-18 DIAGNOSIS — Z7984 Long term (current) use of oral hypoglycemic drugs: Secondary | ICD-10-CM

## 2023-09-18 DIAGNOSIS — Z7985 Long-term (current) use of injectable non-insulin antidiabetic drugs: Secondary | ICD-10-CM

## 2023-09-18 DIAGNOSIS — Z6834 Body mass index (BMI) 34.0-34.9, adult: Secondary | ICD-10-CM

## 2023-09-18 DIAGNOSIS — E1142 Type 2 diabetes mellitus with diabetic polyneuropathy: Secondary | ICD-10-CM | POA: Diagnosis present

## 2023-09-18 DIAGNOSIS — K029 Dental caries, unspecified: Secondary | ICD-10-CM | POA: Diagnosis present

## 2023-09-18 DIAGNOSIS — Z89432 Acquired absence of left foot: Secondary | ICD-10-CM

## 2023-09-18 LAB — COMPREHENSIVE METABOLIC PANEL WITH GFR
ALT: 12 U/L (ref 0–44)
AST: 16 U/L (ref 15–41)
Albumin: 3.5 g/dL (ref 3.5–5.0)
Alkaline Phosphatase: 56 U/L (ref 38–126)
Anion gap: 10 (ref 5–15)
BUN: 30 mg/dL — ABNORMAL HIGH (ref 6–20)
CO2: 22 mmol/L (ref 22–32)
Calcium: 8.7 mg/dL — ABNORMAL LOW (ref 8.9–10.3)
Chloride: 107 mmol/L (ref 98–111)
Creatinine, Ser: 2.58 mg/dL — ABNORMAL HIGH (ref 0.61–1.24)
GFR, Estimated: 30 mL/min — ABNORMAL LOW (ref >60)
Glucose, Bld: 120 mg/dL — ABNORMAL HIGH (ref 70–99)
Potassium: 4.4 mmol/L (ref 3.5–5.1)
Sodium: 139 mmol/L (ref 135–145)
Total Bilirubin: 0.3 mg/dL (ref 0.0–1.2)
Total Protein: 6.8 g/dL (ref 6.5–8.1)

## 2023-09-18 LAB — CBC WITH DIFFERENTIAL/PLATELET
Abs Immature Granulocytes: 0.01 K/uL (ref 0.00–0.07)
Basophils Absolute: 0 K/uL (ref 0.0–0.1)
Basophils Relative: 0 %
Eosinophils Absolute: 0.3 K/uL (ref 0.0–0.5)
Eosinophils Relative: 5 %
HCT: 39.1 % (ref 39.0–52.0)
Hemoglobin: 12.9 g/dL — ABNORMAL LOW (ref 13.0–17.0)
Immature Granulocytes: 0 %
Lymphocytes Relative: 33 %
Lymphs Abs: 2.2 K/uL (ref 0.7–4.0)
MCH: 30.6 pg (ref 26.0–34.0)
MCHC: 33 g/dL (ref 30.0–36.0)
MCV: 92.7 fL (ref 80.0–100.0)
Monocytes Absolute: 0.8 K/uL (ref 0.1–1.0)
Monocytes Relative: 12 %
Neutro Abs: 3.3 K/uL (ref 1.7–7.7)
Neutrophils Relative %: 50 %
Platelets: 312 K/uL (ref 150–400)
RBC: 4.22 MIL/uL (ref 4.22–5.81)
RDW: 12.9 % (ref 11.5–15.5)
WBC: 6.7 K/uL (ref 4.0–10.5)
nRBC: 0 % (ref 0.0–0.2)

## 2023-09-18 LAB — URINALYSIS, W/ REFLEX TO CULTURE (INFECTION SUSPECTED)
Bacteria, UA: NONE SEEN
Bilirubin Urine: NEGATIVE
Glucose, UA: >=500 mg/dL — AB
Hgb urine dipstick: NEGATIVE
Ketones, ur: NEGATIVE mg/dL
Leukocytes,Ua: NEGATIVE
Nitrite: NEGATIVE
Protein, ur: >=300 mg/dL — AB
Specific Gravity, Urine: 1.014 (ref 1.005–1.030)
pH: 6 (ref 5.0–8.0)

## 2023-09-18 LAB — I-STAT CG4 LACTIC ACID, ED: Lactic Acid, Venous: 0.8 mmol/L (ref 0.5–1.9)

## 2023-09-18 MED ORDER — PIPERACILLIN-TAZOBACTAM 3.375 G IVPB 30 MIN
3.3750 g | Freq: Once | INTRAVENOUS | Status: AC
  Administered 2023-09-18: 3.375 g via INTRAVENOUS
  Filled 2023-09-18: qty 50

## 2023-09-18 MED ORDER — VANCOMYCIN HCL 2000 MG/400ML IV SOLN
2000.0000 mg | Freq: Once | INTRAVENOUS | Status: AC
  Administered 2023-09-19: 2000 mg via INTRAVENOUS
  Filled 2023-09-18: qty 400

## 2023-09-18 NOTE — ED Provider Notes (Signed)
 [facility]    CSN: [id number] Arrival date & time: 09/18/23  1619      History   Chief Complaint Chief Complaint  Patient presents with   Foot Pain    HPI Barry [name] is a 48 y.o. male.   48 year old male with diabetes and significant peripheral neuropathy with right 1st, 2nd and 3rd toes that are turning black without a traumatic injury and the nails are falling off.  The toes have a significant foul odor and have turned black in the last few days.  When he pulled his sock off the right foot for the exam, the sock caught and pulled the toenail completely off the right great toe.  He feels like the toenail on the right second toe is very loose also.  He denies fever.  He has no sensation in his feet and is not having any pain.  He saw his endocrinologist in March 2025 and saw nephrology in April or May 2025.  He was seen in the ER at [facility] in June 2025 and was diagnosed with pain associated with peripheral neuropathy for his feet.  The symptoms are new.  He also thinks he may have a dental abscess.   Foot Pain Pertinent negatives include no chest pain and no abdominal pain.    Past Medical History:  Diagnosis Date   Carpal tunnel syndrome    Diabetes mellitus without complication (HCC)    Gout    Hypercholesteremia    Hypertension    Hypothyroidism    Neuropathy    Obesity    Thyroid  disease     Patient Active Problem List   Diagnosis Date Noted   Biliary colic 11/21/2021   Acute pancreatitis 11/20/2021   Cellulitis of left lower extremity 06/22/2021   Sepsis (HCC) 06/22/2021   Gout flare 06/22/2021   Acute osteomyelitis of left foot (HCC) 06/10/2021   Leukocytosis 06/10/2021   Diabetic ulcer of left foot associated with type 2 diabetes mellitus (HCC) 06/09/2021   Class 3 obesity 06/09/2021   Type 2 diabetes mellitus with hyperglycemia (HCC) 06/09/2021   Acute kidney injury superimposed on chronic kidney disease (HCC) 02/14/2021   Atypical chest  pain 02/14/2021   Cellulitis and abscess of foot 02/14/2021   Cholelithiasis 02/14/2021   Chronic cholecystitis with calculus 02/14/2021   Dependent edema 02/14/2021   DKA (diabetic ketoacidosis) (HCC) 02/14/2021   Failure of outpatient treatment 02/14/2021   Fracture of right great toe 02/14/2021   Hyponatremia 02/14/2021   Noncompliance with medication regimen 02/14/2021   Knee effusion, left 02/14/2021   Pneumonia 02/14/2021   Retrograde ejaculation 12/12/2019   AKI (acute kidney injury) (HCC) 08/05/2018   Stage 3b chronic kidney disease (CKD) (HCC) 08/05/2018   Hyperosmolar non-ketotic state in patient with type 2 diabetes mellitus (HCC) 08/04/2018   Pneumonia due to COVID-19 virus 08/04/2018   Pain in joint of left elbow 07/02/2018   Pain in left knee 07/02/2018   Pain of left hand 03/20/2018   Thoracic radiculopathy 05/01/2015   Chronic allergic conjunctivitis 04/20/2015   Congenital cataract 04/20/2015   Diabetic cataract (HCC) 04/20/2015   Dry eyes, bilateral 04/20/2015   Myopia of both eyes 04/20/2015   Paronychia 04/20/2015   Carpal tunnel syndrome, left 03/29/2015   Dysuria 03/21/2015   Arm numbness left 03/20/2015   Diabetic neuropathy (HCC) 03/20/2015   Neck pain 03/20/2015   Hypogonadism male 03/19/2015   Morbid obesity with BMI of 40.0-44.9, adult (HCC) 03/19/2015   Burning sensation  of feet 03/16/2015   Common migraine 03/16/2015   Degenerative disc disease, lumbar 03/16/2015   Depression 03/16/2015   Hypertension 03/16/2015   Low back pain 03/16/2015   OSA (obstructive sleep apnea) 03/16/2015   DM2 (diabetes mellitus, type 2) (HCC) 03/16/2015   Hypothyroidism 05/31/2008   HLD (hyperlipidemia) 05/31/2008   OBESITY, MORBID 05/31/2008   MYOCARDIAL PERFUSION SCAN, WITH STRESS TEST, ABNORMAL 05/31/2008   Dyslipidemia 05/31/2008    Past Surgical History:  Procedure Laterality Date   AMPUTATION Left 06/10/2021   Procedure: 5th RAY AMPUTATION LEFT FOOT;   Surgeon: Janit Thresa HERO, DPM;  Location: [facility];  Service: Podiatry;  Laterality: Left;   CHOLECYSTECTOMY N/A 11/21/2021   Procedure: LAPAROSCOPIC CHOLECYSTECTOMY;  Surgeon: Lyndel Deward PARAS, MD;  Location: [facility];  Service: General;  Laterality: N/A;   FRACTURE SURGERY     INCISE AND DRAIN ABCESS     IRRIGATION AND DEBRIDEMENT FOOT Right 04/27/2019   Procedure: Debridement and Irrigation with possible placement of antibiotic beads;  Surgeon: Gretel Ozell PARAS, DPM;  Location: [facility];  Service: Podiatry;  Laterality: Right;   ORIF TOE FRACTURE Right 04/27/2019   Procedure: Open Reduction vs Excision of Fracture Fragment Right great toe, with pinning.;  Surgeon: Gretel Ozell PARAS, DPM;  Location: [facility];  Service: Podiatry;  Laterality: Right;   TONSILLECTOMY         Home Medications    Prior to Admission medications   Medication Sig Start Date End Date Taking? Authorizing Provider  hydrALAZINE  (APRESOLINE ) 10 MG tablet Take 10 mg by mouth 3 (three) times daily. 04/30/23  Yes [provider]  losartan  (COZAAR ) 25 MG tablet Take 25 mg by mouth 2 (two) times daily. 08/14/23  Yes [provider]  pregabalin  (LYRICA ) 50 MG capsule Take 50 mg by mouth 3 (three) times daily.   Yes [provider]  acetaminophen  (TYLENOL ) 325 MG tablet Take 2 tablets (650 mg total) by mouth every 6 (six) hours as needed. 01/18/23   Elnor Jayson LABOR, DO  allopurinol  (ZYLOPRIM ) 300 MG tablet Take 1 tablet (300 mg total) by mouth daily. 11/23/21   Akula, Vijaya, MD  amLODipine  (NORVASC ) 10 MG tablet Take 1 tablet (10 mg total) by mouth daily. 11/23/21 02/21/22  Akula, Vijaya, MD  aspirin  81 MG chewable tablet Chew 1 tablet (81 mg total) by mouth daily. 11/23/21   Akula, Vijaya, MD  atorvastatin  (LIPITOR) 80 MG tablet Take 1 tablet (80 mg total) by mouth daily. 11/23/21   Cherlyn Labella, MD  benzocaine  (ORAJEL) 10 % mucosal gel Use as directed 1 Application in the mouth or throat as needed for mouth pain.  08/03/22   Small, Brooke L, PA  Carboxymethylcellulose Sodium (EYE DROPS OP) Place 2 drops into both eyes daily as needed (dry eye).    [provider]  ciprofloxacin -dexamethasone  (CIPRODEX ) OTIC suspension Place 4 drops into the right ear 2 (two) times daily. 08/12/22   Elnor Hila P, DO  colchicine  0.6 MG tablet Take 1 tablet (0.6 mg total) by mouth daily as needed. Patient taking differently: Take 0.6 mg by mouth daily as needed (gout). 06/27/21   Amin, Ankit C, MD  diclofenac  Sodium (VOLTAREN ) 1 % GEL Apply 4 g topically 4 (four) times daily. 11/22/22   Emil Share, DO  DULoxetine  (CYMBALTA ) 30 MG capsule Take 30 mg by mouth daily.    [provider]  HUMALOG MIX 75/25 (75-25) 100 UNIT/ML SUSP injection Inject 100 Units into the skin 2 (two) times  a day. 06/07/18   [provider]  ibuprofen  (ADVIL ) 600 MG tablet Take 1 tablet (600 mg total) by mouth every 6 (six) hours as needed. 01/18/23   Elnor Jayson LABOR, DO  JARDIANCE  25 MG TABS tablet Take 25 mg by mouth daily. 10/18/18   [provider]  naloxone  (NARCAN ) nasal spray 4 mg/0.1 mL Place 1 spray into the nose once. 06/12/21   [provider]  oxyCODONE  (ROXICODONE ) 5 MG immediate release tablet Take 1 tablet (5 mg total) by mouth every 4 (four) hours as needed for severe pain (pain score 7-10). 01/18/23   Elnor Jayson A, DO  OZEMPIC , 0.25 OR 0.5 MG/DOSE, 2 MG/3ML SOPN Inject 0.5 mg as directed once a week. fridays 11/23/21   Cherlyn Labella, MD    Family History History reviewed. No pertinent family history.  Social History Social History   Tobacco Use   Smoking status: Never   Smokeless tobacco: Never  Vaping Use   Vaping status: Never Used  Substance Use Topics   Alcohol use: Yes    Comment: rare   Drug use: Yes    Types: Marijuana    Comment: rare     Allergies   Patient has no known allergies.   Review of Systems Review of Systems  Constitutional:  Negative for chills and fever.   HENT:  Positive for dental problem (Patient reporting he thinks he has a dental abscess.). Negative for ear pain and sore throat.   Eyes:  Negative for pain and visual disturbance.  Respiratory:  Negative for cough.   Cardiovascular:  Negative for chest pain and palpitations.  Gastrointestinal:  Negative for abdominal pain, constipation, diarrhea, nausea and vomiting.  Genitourinary:  Negative for dysuria and hematuria.  Musculoskeletal:  Negative for arthralgias and back pain.  Skin:  Positive for wound (Right 1st, 2nd and 3rd toes are black and the toenails are falling off.  There is an odor to the toes.). Negative for color change and rash.  Neurological:  Negative for seizures and syncope.  All other systems reviewed and are negative.    Physical Exam Triage Vital Signs ED Triage Vitals  Encounter Vitals Group     BP 09/18/23 1642 (!) 162/100     Girls Systolic BP Percentile --      Girls Diastolic BP Percentile --      Boys Systolic BP Percentile --      Boys Diastolic BP Percentile --      Pulse Rate 09/18/23 1642 92     Resp 09/18/23 1642 20     Temp 09/18/23 1642 98.3 F (36.8 C)     Temp Source 09/18/23 1642 Oral     SpO2 --      Weight --      Height --      Head Circumference --      Peak Flow --      Pain Score 09/18/23 1644 8     Pain Loc --      Pain Education --      Exclude from Growth Chart --    No data found.  Updated Vital Signs BP (!) 162/100 (BP Location: Right Arm)   Pulse 92   Temp 98.3 F (36.8 C) (Oral)   Resp 20   Visual Acuity Right Eye Distance:   Left Eye Distance:   Bilateral Distance:    Right Eye Near:   Left Eye Near:    Bilateral Near:     Physical  Exam Vitals and nursing note reviewed.  Constitutional:      General: He is not in acute distress.    Appearance: He is well-developed. He is not ill-appearing or toxic-appearing.  HENT:     Head: Normocephalic and atraumatic.     Right Ear: Hearing, tympanic membrane, ear  canal and external ear normal.     Left Ear: Hearing, tympanic membrane, ear canal and external ear normal.     Nose: No congestion or rhinorrhea.     Right Sinus: No maxillary sinus tenderness or frontal sinus tenderness.     Left Sinus: No maxillary sinus tenderness or frontal sinus tenderness.     Mouth/Throat:     Lips: Pink.     Mouth: Mucous membranes are moist.     Dentition: Abnormal dentition. Dental tenderness, gingival swelling, dental caries and dental abscesses present.     Pharynx: Uvula midline. No oropharyngeal exudate or posterior oropharyngeal erythema.     Tonsils: No tonsillar exudate.     Comments: Right lower jaw with swelling erythema and tenderness in the molar area. Eyes:     Conjunctiva/sclera: Conjunctivae normal.     Pupils: Pupils are equal, round, and reactive to light.  Cardiovascular:     Rate and Rhythm: Normal rate and regular rhythm.     Heart sounds: S1 normal and S2 normal. No murmur heard. Pulmonary:     Effort: Pulmonary effort is normal. No respiratory distress.     Breath sounds: Normal breath sounds. No decreased breath sounds, wheezing, rhonchi or rales.  Abdominal:     General: Bowel sounds are normal.     Palpations: Abdomen is soft.     Tenderness: There is no abdominal tenderness.  Musculoskeletal:        General: No swelling.     Cervical back: Neck supple.  Feet:     Right foot:     Toenail Condition: Right toenails are abnormally thick. Fungal disease present.    Left foot:     Toenail Condition: Fungal disease present.    Comments: Right 1st, 2nd and 3rd toes are pale and black with a foul odor.  The toenail completely pulled off the right great toe.  The toenail is loose on the right second toe.  Patient has no sensation at all in his toes and very little sensation in his feet.  See photo for more information. Lymphadenopathy:     Head:     Right side of head: No submental, submandibular, tonsillar, preauricular or posterior  auricular adenopathy.     Left side of head: No submental, submandibular, tonsillar, preauricular or posterior auricular adenopathy.     Cervical: No cervical adenopathy.     Right cervical: No superficial cervical adenopathy.    Left cervical: No superficial cervical adenopathy.  Skin:    General: Skin is warm and dry.     Capillary Refill: Capillary refill takes less than 2 seconds.     Findings: No rash.  Neurological:     Mental Status: He is alert and oriented to person, place, and time.  Psychiatric:        Mood and Affect: Mood normal.      UC Treatments / Results  Labs (all labs ordered are listed, but only abnormal results are displayed) Labs Reviewed - No data to display  EKG   Radiology No results found.  Procedures Procedures (including critical care time)  Medications Ordered in UC Medications - No data to display  Initial  Impression / Assessment and Plan / UC Course  I have reviewed the triage vital signs and the nursing notes.  Pertinent labs & imaging results that were available during my care of the patient were reviewed by me and considered in my medical decision making (see chart for details).  Plan of Care: Cellulitis of right 1st, 2nd and 3rd toes with probable osteomyelitis, diabetes with peripheral neuropathy and probable right lower dental abscess: Patient's feet are in bad condition and given the appearance and the odor, he needs to go to an emergency room for further workup but minding clued a bone scan, IV antibiotics and possibly even amputation of these toes.  Patient is going to go by family vehicle to [employer] or [employer].  I reviewed the plan of care with the patient and/or the patient's guardian.  The patient and/or guardian had time to ask questions and acknowledged that the questions were answered.  I provided instruction on symptoms or reasons to return here or to go to an ER, if symptoms/condition did not improve, worsened or if new  symptoms occurred.  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  Advised that he needs to go to an emergency room for further evaluation of his toes.  He may need IV antibiotics, bone scan to look for osteomyelitis and he may even need surgical amputation of these toes.  Patient is stable to go by family vehicle to [facility] health ER or [facility] ER to be seen today.     ED Prescriptions   None    PDMP not reviewed this encounter.   Ival Domino, FNP 09/18/23 [phone]

## 2023-09-18 NOTE — ED Provider Notes (Signed)
 Barry [name]   CSN: [id number] Arrival date & time: 09/18/23  1757     Patient presents with: Wound Infection   Barry [name] is a 48 y.o. male. Patient with past medical history significant for T2DM, peripheral neuropathy presents to the emergency department complaining of an infection to the 1st, 2nd, and 3rd toes of the right foot. He has noticed a black color and foul odor which has developed over the past few days. He denies fever, nausea, vomiting.    HPI     Prior to Admission medications   Medication Sig Start Date End Date Taking? Authorizing Provider  acetaminophen  (TYLENOL ) 325 MG tablet Take 2 tablets (650 mg total) by mouth every 6 (six) hours as needed. 01/18/23   Elnor Jayson LABOR, DO  allopurinol  (ZYLOPRIM ) 300 MG tablet Take 1 tablet (300 mg total) by mouth daily. 11/23/21   Akula, Vijaya, MD  amLODipine  (NORVASC ) 10 MG tablet Take 1 tablet (10 mg total) by mouth daily. 11/23/21 02/21/22  Cherlyn Labella, MD  aspirin  81 MG chewable tablet Chew 1 tablet (81 mg total) by mouth daily. 11/23/21   Akula, Vijaya, MD  atorvastatin  (LIPITOR) 80 MG tablet Take 1 tablet (80 mg total) by mouth daily. 11/23/21   Akula, Vijaya, MD  benzocaine  (ORAJEL) 10 % mucosal gel Use as directed 1 Application in the mouth or throat as needed for mouth pain. 08/03/22   Small, Brooke L, PA  Carboxymethylcellulose Sodium (EYE DROPS OP) Place 2 drops into both eyes daily as needed (dry eye).    [provider]  ciprofloxacin -dexamethasone  (CIPRODEX ) OTIC suspension Place 4 drops into the right ear 2 (two) times daily. 08/12/22   Elnor Hila P, DO  colchicine  0.6 MG tablet Take 1 tablet (0.6 mg total) by mouth daily as needed. Patient taking differently: Take 0.6 mg by mouth daily as needed (gout). 06/27/21   Amin, Ankit C, MD  diclofenac  Sodium (VOLTAREN ) 1 % GEL Apply 4 g topically 4 (four) times daily. 11/22/22   Emil Share, DO  DULoxetine   (CYMBALTA ) 30 MG capsule Take 30 mg by mouth daily.    [provider]  HUMALOG MIX 75/25 (75-25) 100 UNIT/ML SUSP injection Inject 100 Units into the skin 2 (two) times a day. 06/07/18   [provider]  hydrALAZINE  (APRESOLINE ) 10 MG tablet Take 10 mg by mouth 3 (three) times daily. 04/30/23   [provider]  ibuprofen  (ADVIL ) 600 MG tablet Take 1 tablet (600 mg total) by mouth every 6 (six) hours as needed. 01/18/23   Elnor Jayson LABOR, DO  JARDIANCE  25 MG TABS tablet Take 25 mg by mouth daily. 10/18/18   [provider]  losartan  (COZAAR ) 25 MG tablet Take 25 mg by mouth 2 (two) times daily. 08/14/23   [provider]  naloxone  (NARCAN ) nasal spray 4 mg/0.1 mL Place 1 spray into the nose once. 06/12/21   [provider]  oxyCODONE  (ROXICODONE ) 5 MG immediate release tablet Take 1 tablet (5 mg total) by mouth every 4 (four) hours as needed for severe pain (pain score 7-10). 01/18/23   Elnor Jayson A, DO  OZEMPIC , 0.25 OR 0.5 MG/DOSE, 2 MG/3ML SOPN Inject 0.5 mg as directed once a week. fridays 11/23/21   Akula, Vijaya, MD  pregabalin  (LYRICA ) 50 MG capsule Take 50 mg by mouth 3 (three) times daily.    [provider]    Allergies: Patient has no known allergies.  Review of Systems  Updated Vital Signs BP (!) 175/103   Pulse 83   Temp 97.8 F (36.6 C) (Oral)   Resp 18   Wt 124.7 kg   SpO2 100%   BMI 34.37 kg/m   Physical Exam HENT:     Head: Normocephalic and atraumatic.  Eyes:     Pupils: Pupils are equal, round, and reactive to light.  Pulmonary:     Effort: Pulmonary effort is normal. No respiratory distress.  Musculoskeletal:        General: No tenderness or signs of injury.     Cervical back: Normal range of motion.  Skin:    General: Skin is dry.     Comments: See attached images. Palpable pedal pulse  Neurological:     Mental Status: He is alert.     Comments: Decreased sensation in the right foot consistent  with patient's known neuropathy  Psychiatric:        Speech: Speech normal.        Behavior: Behavior normal.     (all labs ordered are listed, but only abnormal results are displayed) Labs Reviewed  COMPREHENSIVE METABOLIC PANEL WITH GFR - Abnormal; Notable for the following components:      Result Value   Glucose, Bld 120 (*)    BUN 30 (*)    Creatinine, Ser 2.58 (*)    Calcium  8.7 (*)    GFR, Estimated 30 (*)    All other components within normal limits  CBC WITH DIFFERENTIAL/PLATELET - Abnormal; Notable for the following components:   Hemoglobin 12.9 (*)    All other components within normal limits  URINALYSIS, W/ REFLEX TO CULTURE (INFECTION SUSPECTED) - Abnormal; Notable for the following components:   Color, Urine STRAW (*)    Glucose, UA >=500 (*)    Protein, ur >=300 (*)    All other components within normal limits  I-STAT CG4 LACTIC ACID, ED  I-STAT CG4 LACTIC ACID, ED    EKG: None  Radiology: DG Foot Complete Right Result Date: 09/18/2023 CLINICAL DATA:  Osteomyelitis EXAM: RIGHT FOOT COMPLETE - 3+ VIEW COMPARISON:  06/22/2021 FINDINGS: There is chronic erosive changes involving the distal aspect of the proximal phalanx of the great toe as well as, to a lesser extent, the base of the distal phalanx of the great toe in keeping with changes of chronic osteomyelitis. There is subcutaneous gas seen within the medial aspect of the great toe in keeping with aggressive infection and/or gangrene. Extensive surrounding soft tissue swelling. Ulcer within the terminal aspect of the left great toe noted with possible exposure of the distal cortex of the distal phalanx at the base of the wound. No acute fracture or dislocation. Remaining joint spaces are preserved. Tiny plantar calcaneal spur. IMPRESSION: 1. Chronic osteomyelitis of the proximal and distal phalanges of the great toe. 2. Subcutaneous gas within the medial aspect of the great toe in keeping with aggressive infection  and/or gangrene. 3. Terminal ulcer of the second digit with possible exposure of the distal phalanx of the base of the wound. Electronically Signed   By: Dorethia Molt M.D.   On: 09/18/2023 19:32     Procedures   Medications Ordered in the ED  vancomycin  (VANCOREADY) IVPB 2000 mg/400 mL (2,000 mg Intravenous New Bag/Given 09/19/23 0041)  lactated ringers  infusion (has no administration in time range)  piperacillin -tazobactam (ZOSYN ) IVPB 3.375 g (0 g Intravenous Stopped 09/19/23 0023)  Medical Decision Making  This patient presents to the ED for concern of toe infection, this involves an extensive number of treatment options, and is a complaint that carries with it a high risk of complications and morbidity.  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  The pertinent results include:  Creatinine 2.58 at patient's baseline, no leukocytosis   Imaging Studies ordered:  I ordered imaging studies including plain films of the right foot  I independently visualized and interpreted imaging which showed  1. Chronic osteomyelitis of the proximal and distal phalanges of the  great toe.  2. Subcutaneous gas within the medial aspect of the great toe in  keeping with aggressive infection and/or gangrene.  3. Terminal ulcer of the second digit with possible exposure of the  distal phalanx of the base of the wound.   I agree with the radiologist interpretation    Problem List / ED Course / Critical interventions / Medication management   I  ordered medication including zosyn , vancomycin    Reevaluation of the patient after these medicines showed that the patient stayed the same   Consultations Obtained:  I requested consultation with the podiatrist, Dr.McDonald,  and discussed lab and imaging findings as well as pertinent plan - they recommend: admission to medicine, NPO at midnight  I requested consultation with the hospitalist, Dr.Sundil, who plans to see patient for admission   Test / Admission - Considered:  Patient with evidence of tenderness great toe on the right foot.  Antibiotics initiated.  Podiatry consulted with plans for possible surgical intervention tomorrow.  Patient NPO.  I requested hospital admission with medicine.      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

## 2023-09-18 NOTE — Discharge Instructions (Signed)
 Probable osteomyelitis of right 1st, 2nd and 3rd toes but absolute cellulitis of the toes and patient has diabetes with peripheral neuropathy and may have a dental abscess.  Advised that he needs to go to an emergency room for further evaluation of his toes.  He may need IV antibiotics, bone scan to look for osteomyelitis and he may even need surgical amputation of these toes.  Patient is stable to go by family vehicle to [facility] health ER or [facility] ER to be seen today.

## 2023-09-18 NOTE — Progress Notes (Signed)
 ED Pharmacy Antibiotic Sign Off An antibiotic consult was received from an ED provider for Vancomycin  per pharmacy dosing for wound infection. A chart review was completed to assess appropriateness.   The following one time order(s) were placed:  Vancomycin  2g IV  Further antibiotic and/or antibiotic pharmacy consults should be ordered by the admitting provider if indicated.   Thank you for allowing pharmacy to be a part of this patient's care.   Arvin Gauss, PharmD Clinical Pharmacist 09/18/23 11:41 PM

## 2023-09-18 NOTE — ED Provider Triage Note (Signed)
 Emergency Medicine Provider Triage Evaluation Note  Barry [name] , a 48 y.o. male  was evaluated in triage.  Pt complains of concern for osteomyelitis, infection to right 1-3 digits that has been present for past two weeks. Hx of DM, osteo  Abscess in mouth for past week. Dental pain to right upper and lower. No difficulty swallowing, shob  Right ear pain  HA. Felt like something crawling in brain on right side of head. Intermittent and gradually worsening  Review of Systems  Positive: See hpi Negative:  Physical Exam  BP (!) 165/98   Pulse 92   Temp 98.1 F (36.7 C) (Oral)   Resp 18   SpO2 95%  Gen:   Awake, no distress   Resp:  Normal effort. Speaking in full and complete sentences MSK:   Moves extremities without difficulty  Other:  No drooling  Medical Decision Making  Medically screening exam initiated at 7:00 PM.  Appropriate orders placed.  Barry [name] was informed that the remainder of the evaluation will be completed by another provider, this initial triage assessment does not replace that evaluation, and the importance of remaining in the ED until their evaluation is complete.  Labs and XR ordered   Barry [name], Barry [name] 09/18/23 1904

## 2023-09-18 NOTE — ED Provider Notes (Incomplete)
 [facility] EMERGENCY DEPARTMENT AT [facility] Provider Note   CSN: [id number] Arrival date & time: 09/18/23  1757     Patient presents with: Wound Infection   Barry [name] is a 48 y.o. male. Patient with past medical history significant for T2DM, peripheral neuropathy presents to the emergency department complaining of an infection to the 1st, 2nd, and 3rd toes of the right foot. He has noticed a black color and foul odor which has developed over the past few days. He denies fever, nausea, vomiting.   {Add pertinent medical, surgical, social history, OB history to HPI:32947} HPI     Prior to Admission medications   Medication Sig Start Date End Date Taking? Authorizing Provider  acetaminophen  (TYLENOL ) 325 MG tablet Take 2 tablets (650 mg total) by mouth every 6 (six) hours as needed. 01/18/23   Elnor Jayson LABOR, DO  allopurinol  (ZYLOPRIM ) 300 MG tablet Take 1 tablet (300 mg total) by mouth daily. 11/23/21   Akula, Vijaya, MD  amLODipine  (NORVASC ) 10 MG tablet Take 1 tablet (10 mg total) by mouth daily. 11/23/21 02/21/22  Akula, Vijaya, MD  aspirin  81 MG chewable tablet Chew 1 tablet (81 mg total) by mouth daily. 11/23/21   Akula, Vijaya, MD  atorvastatin  (LIPITOR) 80 MG tablet Take 1 tablet (80 mg total) by mouth daily. 11/23/21   Cherlyn Labella, MD  benzocaine  (ORAJEL) 10 % mucosal gel Use as directed 1 Application in the mouth or throat as needed for mouth pain. 08/03/22   Small, Brooke L, PA  Carboxymethylcellulose Sodium (EYE DROPS OP) Place 2 drops into both eyes daily as needed (dry eye).    [provider]  ciprofloxacin -dexamethasone  (CIPRODEX ) OTIC suspension Place 4 drops into the right ear 2 (two) times daily. 08/12/22   Elnor Hila P, DO  colchicine  0.6 MG tablet Take 1 tablet (0.6 mg total) by mouth daily as needed. Patient taking differently: Take 0.6 mg by mouth daily as needed (gout). 06/27/21   Amin, Ankit C, MD  diclofenac  Sodium (VOLTAREN ) 1 % GEL Apply 4  g topically 4 (four) times daily. 11/22/22   Emil Share, DO  DULoxetine  (CYMBALTA ) 30 MG capsule Take 30 mg by mouth daily.    [provider]  HUMALOG MIX 75/25 (75-25) 100 UNIT/ML SUSP injection Inject 100 Units into the skin 2 (two) times a day. 06/07/18   [provider]  hydrALAZINE  (APRESOLINE ) 10 MG tablet Take 10 mg by mouth 3 (three) times daily. 04/30/23   [provider]  ibuprofen  (ADVIL ) 600 MG tablet Take 1 tablet (600 mg total) by mouth every 6 (six) hours as needed. 01/18/23   Elnor Jayson LABOR, DO  JARDIANCE  25 MG TABS tablet Take 25 mg by mouth daily. 10/18/18   [provider]  losartan  (COZAAR ) 25 MG tablet Take 25 mg by mouth 2 (two) times daily. 08/14/23   [provider]  naloxone  (NARCAN ) nasal spray 4 mg/0.1 mL Place 1 spray into the nose once. 06/12/21   [provider]  oxyCODONE  (ROXICODONE ) 5 MG immediate release tablet Take 1 tablet (5 mg total) by mouth every 4 (four) hours as needed for severe pain (pain score 7-10). 01/18/23   Elnor Jayson A, DO  OZEMPIC , 0.25 OR 0.5 MG/DOSE, 2 MG/3ML SOPN Inject 0.5 mg as directed once a week. fridays 11/23/21   Akula, Vijaya, MD  pregabalin  (LYRICA ) 50 MG capsule Take 50 mg by mouth 3 (three) times daily.    [provider]  Allergies: Patient has no known allergies.    Review of Systems  Updated Vital Signs BP (!) 175/103   Pulse 83   Temp 97.8 F (36.6 C) (Oral)   Resp 18   SpO2 100%   Physical Exam HENT:     Head: Normocephalic and atraumatic.  Eyes:     Pupils: Pupils are equal, round, and reactive to light.  Pulmonary:     Effort: Pulmonary effort is normal. No respiratory distress.  Musculoskeletal:        General: No tenderness or signs of injury.     Cervical back: Normal range of motion.  Skin:    General: Skin is dry.     Comments: See attached images. Palpable pedal pulse  Neurological:     Mental Status: He is alert.     Comments: Decreased  sensation in the right foot consistent with patient's known neuropathy  Psychiatric:        Speech: Speech normal.        Behavior: Behavior normal.     (all labs ordered are listed, but only abnormal results are displayed) Labs Reviewed  COMPREHENSIVE METABOLIC PANEL WITH GFR - Abnormal; Notable for the following components:      Result Value   Glucose, Bld 120 (*)    BUN 30 (*)    Creatinine, Ser 2.58 (*)    Calcium  8.7 (*)    GFR, Estimated 30 (*)    All other components within normal limits  CBC WITH DIFFERENTIAL/PLATELET - Abnormal; Notable for the following components:   Hemoglobin 12.9 (*)    All other components within normal limits  URINALYSIS, W/ REFLEX TO CULTURE (INFECTION SUSPECTED) - Abnormal; Notable for the following components:   Color, Urine STRAW (*)    Glucose, UA >=500 (*)    Protein, ur >=300 (*)    All other components within normal limits  I-STAT CG4 LACTIC ACID, ED  I-STAT CG4 LACTIC ACID, ED    EKG: None  Radiology: DG Foot Complete Right Result Date: 09/18/2023 CLINICAL DATA:  Osteomyelitis EXAM: RIGHT FOOT COMPLETE - 3+ VIEW COMPARISON:  06/22/2021 FINDINGS: There is chronic erosive changes involving the distal aspect of the proximal phalanx of the great toe as well as, to a lesser extent, the base of the distal phalanx of the great toe in keeping with changes of chronic osteomyelitis. There is subcutaneous gas seen within the medial aspect of the great toe in keeping with aggressive infection and/or gangrene. Extensive surrounding soft tissue swelling. Ulcer within the terminal aspect of the left great toe noted with possible exposure of the distal cortex of the distal phalanx at the base of the wound. No acute fracture or dislocation. Remaining joint spaces are preserved. Tiny plantar calcaneal spur. IMPRESSION: 1. Chronic osteomyelitis of the proximal and distal phalanges of the great toe. 2. Subcutaneous gas within the medial aspect of the great toe  in keeping with aggressive infection and/or gangrene. 3. Terminal ulcer of the second digit with possible exposure of the distal phalanx of the base of the wound. Electronically Signed   By: Dorethia Molt M.D.   On: 09/18/2023 19:32    {Document cardiac monitor, telemetry assessment procedure when appropriate:32947} Procedures   Medications Ordered in the ED - No data to display    {Click here for ABCD2, HEART and other calculators REFRESH Note before signing:1}  Medical Decision Making  This patient presents to the ED for concern of toe infection, this involves an extensive number of treatment options, and is a complaint that carries with it a high risk of complications and morbidity.  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  The pertinent results include:  Creatinine 2.58 at patient's baseline, no leukocytosis   Imaging Studies ordered:  I ordered imaging studies including plain films of the right foot  I independently visualized and interpreted imaging which showed  1. Chronic osteomyelitis of the proximal and distal phalanges of the  great toe.  2. Subcutaneous gas within the medial aspect of the great toe in  keeping with aggressive infection and/or gangrene.  3. Terminal ulcer of the second digit with possible exposure of the  distal phalanx of the base of the wound.   I agree with the radiologist interpretation    Problem List / ED Course / Critical interventions / Medication management   I ordered medication including zosyn ,  vancomycin    Reevaluation of the patient after these medicines showed that the patient stayed the same   Consultations Obtained:  I requested consultation with the podiatrist, Dr.McDonald,  and discussed lab and imaging findings as well as pertinent plan - they recommend: admission to medicine, NPO at midnight  I requested consultation with the hospitalist   Test / Admission - Considered:  Patient with evidence of tenderness great toe on the right foot.  Antibiotics initiated.  Podiatry consulted with plans for possible surgical intervention tomorrow.  Patient NPO.  I requested hospital admission with medicine.   {Document critical care time when appropriate  Document review of labs and clinical decision tools ie CHADS2VASC2, etc  Document your independent review of radiology images and any outside records  Document your discussion with family members, caretakers and with consultants  Document social determinants of health affecting pt's care  Document your decision making why or why not admission, treatments were needed:32947:::1}   Final diagnoses:  None    ED Discharge Orders     None

## 2023-09-18 NOTE — ED Triage Notes (Signed)
 Patient presents from urgent care due to possible osteomyelitis of the right 1st 2nd and 3rd right toes and well as cellulitis. He also believes he may have a dental abscess on the upper right.

## 2023-09-18 NOTE — ED Triage Notes (Signed)
 Patient presents for evaluation of significant swelling, discoloration of 3 toes on right foot. Patient is diabetic and has neuropathy. Frequent blisters to both feet. Nails on right foot, loose. Skin around great toe black. Patient states has not been able to see physician. To see endocrinologist in September.

## 2023-09-18 NOTE — ED Notes (Signed)
 Patient is being discharged from the Urgent Care and sent to the Emergency Department via pov . Per CANDIE Settler, FNP, patient is in need of higher level of care due to foot wound. Patient is aware and verbalizes understanding of plan of care.  Vitals:   09/18/23 1642  BP: (!) 162/100  Pulse: 92  Resp: 20  Temp: 98.3 F (36.8 C)

## 2023-09-19 ENCOUNTER — Inpatient Hospital Stay (HOSPITAL_COMMUNITY)

## 2023-09-19 DIAGNOSIS — E1169 Type 2 diabetes mellitus with other specified complication: Secondary | ICD-10-CM | POA: Diagnosis present

## 2023-09-19 DIAGNOSIS — M869 Osteomyelitis, unspecified: Secondary | ICD-10-CM

## 2023-09-19 DIAGNOSIS — I10 Essential (primary) hypertension: Secondary | ICD-10-CM | POA: Diagnosis not present

## 2023-09-19 DIAGNOSIS — E7800 Pure hypercholesterolemia, unspecified: Secondary | ICD-10-CM | POA: Diagnosis present

## 2023-09-19 DIAGNOSIS — I129 Hypertensive chronic kidney disease with stage 1 through stage 4 chronic kidney disease, or unspecified chronic kidney disease: Secondary | ICD-10-CM | POA: Diagnosis present

## 2023-09-19 DIAGNOSIS — Z6834 Body mass index (BMI) 34.0-34.9, adult: Secondary | ICD-10-CM | POA: Diagnosis not present

## 2023-09-19 DIAGNOSIS — E1152 Type 2 diabetes mellitus with diabetic peripheral angiopathy with gangrene: Secondary | ICD-10-CM | POA: Diagnosis present

## 2023-09-19 DIAGNOSIS — L03031 Cellulitis of right toe: Secondary | ICD-10-CM | POA: Diagnosis present

## 2023-09-19 DIAGNOSIS — E1142 Type 2 diabetes mellitus with diabetic polyneuropathy: Secondary | ICD-10-CM | POA: Diagnosis present

## 2023-09-19 DIAGNOSIS — L97519 Non-pressure chronic ulcer of other part of right foot with unspecified severity: Secondary | ICD-10-CM | POA: Diagnosis present

## 2023-09-19 DIAGNOSIS — Z89432 Acquired absence of left foot: Secondary | ICD-10-CM | POA: Diagnosis not present

## 2023-09-19 DIAGNOSIS — Z7984 Long term (current) use of oral hypoglycemic drugs: Secondary | ICD-10-CM | POA: Diagnosis not present

## 2023-09-19 DIAGNOSIS — E11628 Type 2 diabetes mellitus with other skin complications: Secondary | ICD-10-CM | POA: Diagnosis not present

## 2023-09-19 DIAGNOSIS — M009 Pyogenic arthritis, unspecified: Secondary | ICD-10-CM | POA: Diagnosis present

## 2023-09-19 DIAGNOSIS — G4733 Obstructive sleep apnea (adult) (pediatric): Secondary | ICD-10-CM | POA: Diagnosis not present

## 2023-09-19 DIAGNOSIS — E11621 Type 2 diabetes mellitus with foot ulcer: Secondary | ICD-10-CM | POA: Diagnosis present

## 2023-09-19 DIAGNOSIS — L089 Local infection of the skin and subcutaneous tissue, unspecified: Secondary | ICD-10-CM | POA: Diagnosis present

## 2023-09-19 DIAGNOSIS — Z7982 Long term (current) use of aspirin: Secondary | ICD-10-CM | POA: Diagnosis not present

## 2023-09-19 DIAGNOSIS — E66811 Obesity, class 1: Secondary | ICD-10-CM | POA: Diagnosis present

## 2023-09-19 DIAGNOSIS — K029 Dental caries, unspecified: Secondary | ICD-10-CM | POA: Diagnosis present

## 2023-09-19 DIAGNOSIS — N1832 Chronic kidney disease, stage 3b: Secondary | ICD-10-CM | POA: Diagnosis present

## 2023-09-19 DIAGNOSIS — E039 Hypothyroidism, unspecified: Secondary | ICD-10-CM | POA: Diagnosis present

## 2023-09-19 DIAGNOSIS — M86671 Other chronic osteomyelitis, right ankle and foot: Secondary | ICD-10-CM | POA: Diagnosis present

## 2023-09-19 DIAGNOSIS — E1122 Type 2 diabetes mellitus with diabetic chronic kidney disease: Secondary | ICD-10-CM | POA: Diagnosis present

## 2023-09-19 DIAGNOSIS — Z7985 Long-term (current) use of injectable non-insulin antidiabetic drugs: Secondary | ICD-10-CM | POA: Diagnosis not present

## 2023-09-19 LAB — C-REACTIVE PROTEIN: CRP: 0.5 mg/dL (ref <1.0)

## 2023-09-19 LAB — PROTEIN / CREATININE RATIO, URINE
Creatinine, Urine: 80 mg/dL
Protein Creatinine Ratio: 4.74 mg/mg{creat} — ABNORMAL HIGH (ref 0.00–0.15)
Total Protein, Urine: 379 mg/dL

## 2023-09-19 LAB — SURGICAL PCR SCREEN
MRSA, PCR: NEGATIVE
Staphylococcus aureus: NEGATIVE

## 2023-09-19 LAB — GLUCOSE, CAPILLARY
Glucose-Capillary: 129 mg/dL — ABNORMAL HIGH (ref 70–99)
Glucose-Capillary: 100 mg/dL — ABNORMAL HIGH (ref 70–99)
Glucose-Capillary: 147 mg/dL — ABNORMAL HIGH (ref 70–99)

## 2023-09-19 LAB — SEDIMENTATION RATE: Sed Rate: 34 mm/h — ABNORMAL HIGH (ref 0–16)

## 2023-09-19 LAB — HEMOGLOBIN A1C
Hgb A1c MFr Bld: 6.9 % — ABNORMAL HIGH (ref 4.8–5.6)
Mean Plasma Glucose: 151.33 mg/dL

## 2023-09-19 MED ORDER — ACETAMINOPHEN 500 MG PO TABS
1000.0000 mg | ORAL_TABLET | Freq: Once | ORAL | Status: AC
  Administered 2023-09-19 – 2023-09-20 (×2): 1000 mg via ORAL
  Filled 2023-09-19: qty 2

## 2023-09-19 MED ORDER — INSULIN ASPART 100 UNIT/ML IJ SOLN: 0.0000 [IU] | Freq: Every day | INTRAMUSCULAR | Status: DC

## 2023-09-19 MED ORDER — SODIUM CHLORIDE 0.9 % IV SOLN
2.0000 g | Freq: Two times a day (BID) | INTRAVENOUS | Status: DC
  Administered 2023-09-19 – 2023-09-22 (×7): 2 g via INTRAVENOUS
  Filled 2023-09-19 (×7): qty 12.5

## 2023-09-19 MED ORDER — ACETAMINOPHEN 650 MG RE SUPP: 650.0000 mg | Freq: Four times a day (QID) | RECTAL | Status: DC | PRN

## 2023-09-19 MED ORDER — METRONIDAZOLE 500 MG/100ML IV SOLN
500.0000 mg | Freq: Two times a day (BID) | INTRAVENOUS | Status: DC
  Administered 2023-09-19 – 2023-09-22 (×6): 500 mg via INTRAVENOUS
  Filled 2023-09-19 (×6): qty 100

## 2023-09-19 MED ORDER — LINEZOLID 600 MG/300ML IV SOLN
600.0000 mg | Freq: Two times a day (BID) | INTRAVENOUS | Status: DC
  Administered 2023-09-19 (×2): 600 mg via INTRAVENOUS
  Filled 2023-09-19 (×3): qty 300

## 2023-09-19 MED ORDER — INSULIN ASPART 100 UNIT/ML IJ SOLN: 0.0000 [IU] | INTRAMUSCULAR | Status: DC

## 2023-09-19 MED ORDER — MUPIROCIN 2 % EX OINT
1.0000 | TOPICAL_OINTMENT | Freq: Two times a day (BID) | CUTANEOUS | Status: DC
  Administered 2023-09-19 – 2023-09-22 (×5): 1 via NASAL
  Filled 2023-09-19: qty 22

## 2023-09-19 MED ORDER — ACETAMINOPHEN 325 MG PO TABS
650.0000 mg | ORAL_TABLET | Freq: Four times a day (QID) | ORAL | Status: DC | PRN
  Administered 2023-09-19 – 2023-09-20 (×2): 650 mg via ORAL
  Filled 2023-09-19 (×3): qty 2

## 2023-09-19 MED ORDER — LACTATED RINGERS IV SOLN: INTRAVENOUS | Status: AC

## 2023-09-19 MED ORDER — ALLOPURINOL 100 MG PO TABS
50.0000 mg | ORAL_TABLET | Freq: Every day | ORAL | Status: DC
  Administered 2023-09-19 – 2023-09-22 (×3): 50 mg via ORAL
  Filled 2023-09-19 (×3): qty 1

## 2023-09-19 MED ORDER — LACTATED RINGERS IV SOLN: INTRAVENOUS | Status: DC

## 2023-09-19 MED ORDER — HYDRALAZINE HCL 25 MG PO TABS
25.0000 mg | ORAL_TABLET | Freq: Four times a day (QID) | ORAL | Status: DC | PRN
  Administered 2023-09-19 – 2023-09-21 (×6): 25 mg via ORAL
  Filled 2023-09-19 (×7): qty 1

## 2023-09-19 MED ORDER — ENOXAPARIN SODIUM 60 MG/0.6ML IJ SOSY
60.0000 mg | PREFILLED_SYRINGE | Freq: Every day | INTRAMUSCULAR | Status: DC
  Administered 2023-09-19 – 2023-09-22 (×3): 60 mg via SUBCUTANEOUS
  Filled 2023-09-19 (×3): qty 0.6

## 2023-09-19 MED ORDER — CARVEDILOL 6.25 MG PO TABS
6.2500 mg | ORAL_TABLET | Freq: Two times a day (BID) | ORAL | Status: DC
  Administered 2023-09-19 – 2023-09-22 (×6): 6.25 mg via ORAL
  Filled 2023-09-19 (×6): qty 1

## 2023-09-19 MED ORDER — INSULIN ASPART 100 UNIT/ML IJ SOLN
0.0000 [IU] | Freq: Three times a day (TID) | INTRAMUSCULAR | Status: DC
  Administered 2023-09-19 – 2023-09-20 (×2): 1 [IU] via SUBCUTANEOUS
  Administered 2023-09-20: 2 [IU] via SUBCUTANEOUS
  Administered 2023-09-21 – 2023-09-22 (×2): 1 [IU] via SUBCUTANEOUS
  Administered 2023-09-22: 2 [IU] via SUBCUTANEOUS

## 2023-09-19 NOTE — ED Notes (Signed)
 Pt ambulated to restroom.

## 2023-09-19 NOTE — H&P (Signed)
 History and Physical    Barry [name] FMW:[id number] DOB: Dec 30, 1975 DOA: 09/18/2023  PCP: Trinidad Glisson, MD Patient coming from: Home.  Independently ambulates at baseline.  Chief Complaint: Right foot messed up  HPI: Taylor Levick is a 48 year old M with PMH of NIDDM-2 with peripheral neuropathy, prior right great toe surgery, CKD-3B, HTN,  morbid obesity,  dental caries, cataract and myopia sent to ED from urgent care due to concern for right foot infection/osteomyelitis  Patient presented to urgent care yesterday due to his right great toe looking dark and toenails falling off.  He has chronic neuropathic pain in his feet unchanged from baseline.  He also noted some foul-smelling drainage.  No fever but admits to chills.  Reports nausea and vomiting that he attributes to Ozempic .  He denies abdominal pain, chest pain, shortness of breath, dysuria, frequency or urgency.  He has previous left fifth toe amputation due to infection.  Patient denies smoking cigarettes, drinking alcohol or recreational drug use.  In ED, slightly hypertensive. Cr 2.58 (baseline).  BUN 30.  CBC without significant finding.  Lactic acid 0.8.  UA with > 500 glucose and > 300 protein.  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  Started on vancomycin  and Zosyn .  Podiatry consulted, and admitted for further care  ROS All review of system negative except for pertinent positives and negatives as history of present illness above.  PMH Past Medical History:  Diagnosis Date   Carpal tunnel syndrome    Diabetes mellitus without complication (HCC)    Gout    Hypercholesteremia    Hypertension    Hypothyroidism    Neuropathy    Obesity     Thyroid  disease    PSH Past Surgical History:  Procedure Laterality Date   AMPUTATION Left 06/10/2021   Procedure: 5th RAY AMPUTATION LEFT FOOT;  Surgeon: Janit Thresa HERO, DPM;  Location: [facility];  Service: Podiatry;  Laterality: Left;   CHOLECYSTECTOMY N/A 11/21/2021   Procedure: LAPAROSCOPIC CHOLECYSTECTOMY;  Surgeon: Lyndel Deward PARAS, MD;  Location: [facility];  Service: General;  Laterality: N/A;   FRACTURE SURGERY     INCISE AND DRAIN ABCESS     IRRIGATION AND DEBRIDEMENT FOOT Right 04/27/2019   Procedure: Debridement and Irrigation with possible placement of antibiotic beads;  Surgeon: Gretel Ozell PARAS, DPM;  Location: [facility];  Service: Podiatry;  Laterality: Right;   ORIF TOE FRACTURE Right 04/27/2019   Procedure: Open Reduction vs Excision of Fracture Fragment Right great toe, with pinning.;  Surgeon: Gretel Ozell PARAS, DPM;  Location: [facility];  Service: Podiatry;  Laterality: Right;   TONSILLECTOMY     Fam HX History reviewed. No pertinent family history.  Social Hx  reports that he has never smoked. He has never used smokeless tobacco. He reports current alcohol use. He reports current drug use. Drug: Marijuana.  Allergy No Known Allergies Home Meds Prior to Admission medications   Medication Sig Start Date End Date Taking? Authorizing Provider  allopurinol  (ZYLOPRIM ) 300 MG tablet Take 1 tablet (300 mg total) by mouth daily. 11/23/21  Yes Akula, Vijaya, MD  amLODipine  (NORVASC ) 10 MG tablet Take 10 mg by mouth daily.   Yes [provider]  atorvastatin  (LIPITOR) 80 MG tablet Take  1 tablet (80 mg total) by mouth daily. 11/23/21  Yes Akula, Vijaya, MD  colchicine  0.6 MG tablet Take 1 tablet (0.6 mg total) by mouth daily as needed. Patient taking differently: Take 0.6 mg by mouth daily as needed (gout). 06/27/21  Yes Amin, Ankit C, MD  DULoxetine  (CYMBALTA ) 30 MG capsule Take 30 mg by mouth daily.   Yes [provider]  JARDIANCE  25 MG TABS tablet Take 25 mg by mouth  daily. 10/18/18  Yes [provider]  losartan  (COZAAR ) 25 MG tablet Take 25 mg by mouth 2 (two) times daily. 08/14/23  Yes [provider]  Semaglutide , 2 MG/DOSE, (OZEMPIC , 2 MG/DOSE,) 8 MG/3ML SOPN Inject 2 mg into the skin once a week.   Yes [provider]  acetaminophen  (TYLENOL ) 325 MG tablet Take 2 tablets (650 mg total) by mouth every 6 (six) hours as needed. Patient not taking: Reported on 09/19/2023 01/18/23   Elnor Savant A, DO  amLODipine  (NORVASC ) 10 MG tablet Take 1 tablet (10 mg total) by mouth daily. 11/23/21 02/21/22  Akula, Vijaya, MD  aspirin  81 MG chewable tablet Chew 1 tablet (81 mg total) by mouth daily. Patient not taking: Reported on 09/19/2023 11/23/21   Cherlyn Labella, MD  benzocaine  (ORAJEL) 10 % mucosal gel Use as directed 1 Application in the mouth or throat as needed for mouth pain. Patient not taking: Reported on 09/19/2023 08/03/22   Small, Brooke L, PA  ciprofloxacin -dexamethasone  (CIPRODEX ) OTIC suspension Place 4 drops into the right ear 2 (two) times daily. Patient not taking: Reported on 09/19/2023 08/12/22   Elnor Hila P, DO  diclofenac  Sodium (VOLTAREN ) 1 % GEL Apply 4 g topically 4 (four) times daily. Patient not taking: Reported on 09/19/2023 11/22/22   Emil Share, DO  ibuprofen  (ADVIL ) 600 MG tablet Take 1 tablet (600 mg total) by mouth every 6 (six) hours as needed. Patient not taking: Reported on 09/19/2023 01/18/23   Elnor Savant A, DO  naloxone  (NARCAN ) nasal spray 4 mg/0.1 mL Place 1 spray into the nose once. 06/12/21   [provider]  oxyCODONE  (ROXICODONE ) 5 MG immediate release tablet Take 1 tablet (5 mg total) by mouth every 4 (four) hours as needed for severe pain (pain score 7-10). Patient not taking: Reported on 09/19/2023 01/18/23   Elnor Savant A, DO  OZEMPIC , 0.25 OR 0.5 MG/DOSE, 2 MG/3ML SOPN Inject 0.5 mg as directed once a week. fridays Patient not taking: Reported on 09/19/2023 11/23/21   Cherlyn Labella, MD     Physical Exam: Vitals:   09/19/23 0630 09/19/23 0636 09/19/23 0846 09/19/23 0900  BP: (!) 160/84  (!) 164/100 (!) 158/100  Pulse: 79  82   Resp: 16  16   Temp:  98.3 F (36.8 C) 98.3 F (36.8 C)   TempSrc:  Oral Oral   SpO2: 99%  99%   Weight:        GENERAL: No acute distress.  Appears well.  HEENT: MMM.  Dental caries with some right lower gum swelling.  No apparent drainage or visible abscess.  Vision and hearing grossly intact.  NECK: Supple.  No apparent JVD.  RESP:  No IWOB. Good air movement bilaterally. CVS:  RRR. Heart sounds normal.  ABD/GI/GU: Bowel sounds present. Soft. Non tender.  MSK/EXT:  Moves extremities.:  Right great toe without erythema increased warmth to touch.  No ulceration.  1st and 2nd toe toenails removed.  DP pulses 2+ bilaterally. SKIN: no apparent skin lesion or wound NEURO:  Awake, alert and oriented appropriately.  No gross deficit.  PSYCH: Calm. Normal affect.   Personally Reviewed Radiological Exams See HPI   Personally Reviewed Labs: CBC: Recent Labs  Lab 09/18/23 1839  WBC 6.7  NEUTROABS 3.3  HGB 12.9*  HCT 39.1  MCV 92.7  PLT 312   Basic Metabolic Panel: Recent Labs  Lab 09/18/23 1839  NA 139  K 4.4  CL 107  CO2 22  GLUCOSE 120*  BUN 30*  CREATININE 2.58*  CALCIUM  8.7*   GFR: CrCl cannot be calculated (Unknown ideal weight.). Liver Function Tests: Recent Labs  Lab 09/18/23 1839  AST 16  ALT 12  ALKPHOS 56  BILITOT 0.3  PROT 6.8  ALBUMIN 3.5   No results for input(s): LIPASE, AMYLASE in the last 168 hours. No results for input(s): AMMONIA in the last 168 hours. Coagulation Profile: No results for input(s): INR, PROTIME in the last 168 hours. Cardiac Enzymes: No results for input(s): CKTOTAL, CKMB, CKMBINDEX, TROPONINI in the last 168 hours. BNP (last 3 results) No results for input(s): PROBNP in the last 8760 hours. HbA1C: No results for input(s): HGBA1C in the last 72  hours. CBG: No results for input(s): GLUCAP in the last 168 hours. Lipid Profile: No results for input(s): CHOL, HDL, LDLCALC, TRIG, CHOLHDL, LDLDIRECT in the last 72 hours. Thyroid  Function Tests: No results for input(s): TSH, T4TOTAL, FREET4, T3FREE, THYROIDAB in the last 72 hours. Anemia Panel: No results for input(s): VITAMINB12, FOLATE, FERRITIN, TIBC, IRON, RETICCTPCT in the last 72 hours. Urine analysis:    Component Value Date/Time   COLORURINE STRAW (A) 09/18/2023 1839   APPEARANCEUR CLEAR 09/18/2023 1839   LABSPEC 1.014 09/18/2023 1839   PHURINE 6.0 09/18/2023 1839   GLUCOSEU >=500 (A) 09/18/2023 1839   HGBUR NEGATIVE 09/18/2023 1839   BILIRUBINUR NEGATIVE 09/18/2023 1839   KETONESUR NEGATIVE 09/18/2023 1839   PROTEINUR >=300 (A) 09/18/2023 1839   NITRITE NEGATIVE 09/18/2023 1839   LEUKOCYTESUR NEGATIVE 09/18/2023 1839     Assessment and plan: Right great toe osteomyelitis/cellulitis: X-ray and MRI concerning for osteomyelitis in right great toe and distal phalanx of the second toe and cellulitis without abscess collection.  Patient is diabetic.  Hemodynamically stable.  No fever or leukocytosis.  Received vancomycin  and Zosyn  in ED. -Continue antibiotics with Zyvox , cefepime  and Flagyl  -Check CRP and ESR -Podiatry on board.  NIDDM-2 with hyperglycemia and neuropathy: A1c 9.2% in 10/2022.  On Ozempic  and Jardiance  at home -SSI-sensitive - Recheck hemoglobin A1c  Essential hypertension: BP elevated. - Start Coreg  6.25 mg twice daily - P.o. hydralazine  as needed - Holding home losartan  given his renal function  CKD-3B: At baseline. -Hold home losartan  - Gentle IV fluid - Monitor - Avoid nephrotoxic meds  History of gout - Continue allopurinol  at reduced dose  Dental caries/infection - Antibiotics as above - Needs outpatient follow-up for dental extraction  Class I obesity: BMI 34.37. - Encourage  DVT prophylaxis:  Subcu Lovenox   Code Status: Full code Family Communication: None at bedside  Consults called: Podiatry Admission status: Inpatient   Mignon ONEIDA Bump MD [facility]  If 7PM-7AM, please contact night-coverage [email or web address]  09/19/2023, 11:20 AM

## 2023-09-19 NOTE — Hospital Course (Signed)
  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  Also patient believes he has a dental abscess.  At presentation to ED patient is hemodynamically stable, borderline hypertensive and afebrile. CBC no evidence of leukocytosis.  CMP showing elevated creatinine 2.58, elevated BUN 30 otherwise unremarkable.  UA unremarkable.  Normal lactic acid level. X-ray of the right foot showed chronic osteomyelitis of proximal and distal phalanxes of great toe subcutaneous gas formation with aggressive infection and gangrene. Terminal ulcer of the second digit with possible exposure of the distal phalanx of the base of the wound.  In the ED patient has been started treating with IV vancomycin  and Zosyn .  EDP discussed with podiatry recommended keep patient n.p.o. and possibly will take OR for debridement/amputation.  Hospitalist has consulted for further evaluation management of gangrenou/diabetic foot infection and acute kidney injury.

## 2023-09-19 NOTE — Anesthesia Preprocedure Evaluation (Signed)
 Anesthesia Evaluation  Patient identified by MRN, date of birth, ID band Patient awake    Reviewed: Allergy & Precautions, NPO status , Patient's Chart, lab work & pertinent test results  History of Anesthesia Complications Negative for: history of anesthetic complications  Airway Mallampati: II  TM Distance: >3 FB Neck ROM: Full    Dental  (+) Missing,    Pulmonary sleep apnea    Pulmonary exam normal        Cardiovascular hypertension, Pt. on medications Normal cardiovascular exam     Neuro/Psych  Headaches   Depression       GI/Hepatic negative GI ROS,,,(+)     substance abuse  marijuana use  Endo/Other  diabetes (on semaglutide ), Type 2, Oral Hypoglycemic AgentsHypothyroidism    Renal/GU Renal InsufficiencyRenal disease (Cr 2.58)  negative genitourinary   Musculoskeletal OSTEOMYELITIS right foot   Abdominal   Peds  Hematology  (+) Blood dyscrasia (Hgb 12.9), anemia   Anesthesia Other Findings Day of surgery medications reviewed with patient.  Reproductive/Obstetrics                              Anesthesia Physical Anesthesia Plan  ASA: 3  Anesthesia Plan: MAC   Post-op Pain Management: Minimal or no pain anticipated   Induction:   PONV Risk Score and Plan: 1 and Treatment may vary due to age or medical condition, Propofol  infusion, Ondansetron  and Midazolam   Airway Management Planned: Natural Airway and Simple Face Mask  Additional Equipment: None  Intra-op Plan:   Post-operative Plan:   Informed Consent: I have reviewed the patients History and Physical, chart, labs and discussed the procedure including the risks, benefits and alternatives for the proposed anesthesia with the patient or authorized representative who has indicated his/her understanding and acceptance.       Plan Discussed with: CRNA  Anesthesia Plan Comments:          Anesthesia Quick  Evaluation

## 2023-09-19 NOTE — Consult Note (Addendum)
 Reason for Consult: Right great toe infection Referring Physician: April Palumbo, MD  Barry [name] is an 48 y.o. male.  HPI: Patient is known to our practice for previous left foot surgery and diabetic foot infections, has had previous right hallux surgery with arthroplasty of interphalangeal joint for previous ulceration.  Developed swelling redness and drainage around the right great toe.  Went to urgent care yesterday they removed the nail and took an x-ray and this was concerning for osteomyelitis so they sent him to the emergency room.  He notes he has had chills but otherwise denies fevers nausea vomiting chest pain shortness of breath  Past Medical History:  Diagnosis Date   Carpal tunnel syndrome    Diabetes mellitus without complication (HCC)    Gout    Hypercholesteremia    Hypertension    Hypothyroidism    Neuropathy    Obesity    Thyroid  disease     Past Surgical History:  Procedure Laterality Date   AMPUTATION Left 06/10/2021   Procedure: 5th RAY AMPUTATION LEFT FOOT;  Surgeon: Janit Thresa HERO, DPM;  Location: [facility];  Service: Podiatry;  Laterality: Left;   CHOLECYSTECTOMY N/A 11/21/2021   Procedure: LAPAROSCOPIC CHOLECYSTECTOMY;  Surgeon: Lyndel Deward PARAS, MD;  Location: [facility];  Service: General;  Laterality: N/A;   FRACTURE SURGERY     INCISE AND DRAIN ABCESS     IRRIGATION AND DEBRIDEMENT FOOT Right 04/27/2019   Procedure: Debridement and Irrigation with possible placement of antibiotic beads;  Surgeon: Gretel Ozell PARAS, DPM;  Location: [facility];  Service: Podiatry;  Laterality: Right;   ORIF TOE FRACTURE Right 04/27/2019   Procedure: Open Reduction vs Excision of Fracture Fragment Right great toe, with pinning.;  Surgeon: Gretel Ozell PARAS, DPM;  Location: [facility];  Service: Podiatry;  Laterality: Right;   TONSILLECTOMY      History reviewed. No pertinent family history.  Social History:  reports that he has never smoked. He has never used smokeless tobacco. He  reports current alcohol use. He reports current drug use. Drug: Marijuana.  Allergies: No Known Allergies  Medications: I have reviewed the patient's current medications.  Results for orders placed or performed during the hospital encounter of 09/18/23 (from the past 48 hours)  Comprehensive metabolic panel     Status: Abnormal   Collection Time: 09/18/23  6:39 PM  Result Value Ref Range   Sodium 139 135 - 145 mmol/L   Potassium 4.4 3.5 - 5.1 mmol/L   Chloride 107 98 - 111 mmol/L   CO2 22 22 - 32 mmol/L   Glucose, Bld 120 (H) 70 - 99 mg/dL    Comment: Glucose reference range applies only to samples taken after fasting for at least 8 hours.   BUN 30 (H) 6 - 20 mg/dL   Creatinine, Ser 7.41 (H) 0.61 - 1.24 mg/dL   Calcium  8.7 (L) 8.9 - 10.3 mg/dL   Total Protein 6.8 6.5 - 8.1 g/dL   Albumin 3.5 3.5 - 5.0 g/dL   AST 16 15 - 41 U/L   ALT 12 0 - 44 U/L   Alkaline Phosphatase 56 38 - 126 U/L   Total Bilirubin 0.3 0.0 - 1.2 mg/dL   GFR, Estimated 30 (L) >60 mL/min    Comment: (NOTE) Calculated using the CKD-EPI Creatinine Equation (2021)    Anion gap 10 5 - 15    Comment: Performed at [facility], [address], [place], [place] [address]  CBC with Differential  Status: Abnormal   Collection Time: 09/18/23  6:39 PM  Result Value Ref Range   WBC 6.7 4.0 - 10.5 K/uL   RBC 4.22 4.22 - 5.81 MIL/uL   Hemoglobin 12.9 (L) 13.0 - 17.0 g/dL   HCT 60.8 60.9 - 47.9 %   MCV 92.7 80.0 - 100.0 fL   MCH 30.6 26.0 - 34.0 pg   MCHC 33.0 30.0 - 36.0 g/dL   RDW 87.0 88.4 - 84.4 %   Platelets 312 150 - 400 K/uL   nRBC 0.0 0.0 - 0.2 %   Neutrophils Relative % 50 %   Neutro Abs 3.3 1.7 - 7.7 K/uL   Lymphocytes Relative 33 %   Lymphs Abs 2.2 0.7 - 4.0 K/uL   Monocytes Relative 12 %   Monocytes Absolute 0.8 0.1 - 1.0 K/uL   Eosinophils Relative 5 %   Eosinophils Absolute 0.3 0.0 - 0.5 K/uL   Basophils Relative 0 %   Basophils Absolute 0.0 0.0 - 0.1 K/uL   Immature  Granulocytes 0 %   Abs Immature Granulocytes 0.01 0.00 - 0.07 K/uL    Comment: Performed at [facility], [address] [place], [place] [address]  Urinalysis, w/ Reflex to Culture (Infection Suspected) -Urine, Clean Catch     Status: Abnormal   Collection Time: 09/18/23  6:39 PM  Result Value Ref Range   Specimen Source URINE, CLEAN CATCH    Color, Urine STRAW (A) YELLOW   APPearance CLEAR CLEAR   Specific Gravity, Urine 1.014 1.005 - 1.030   pH 6.0 5.0 - 8.0   Glucose, UA >=500 (A) NEGATIVE mg/dL   Hgb urine dipstick NEGATIVE NEGATIVE   Bilirubin Urine NEGATIVE NEGATIVE   Ketones, ur NEGATIVE NEGATIVE mg/dL   Protein, ur >=699 (A) NEGATIVE mg/dL   Nitrite NEGATIVE NEGATIVE   Leukocytes,Ua NEGATIVE NEGATIVE   RBC / HPF 0-5 0 - 5 RBC/hpf   WBC, UA 0-5 0 - 5 WBC/hpf    Comment:        Reflex urine culture not performed if WBC <=10, OR if Squamous epithelial cells >5. If Squamous epithelial cells >5 suggest recollection.    Bacteria, UA NONE SEEN NONE SEEN   Squamous Epithelial / HPF 0-5 0 - 5 /HPF    Comment: Performed at [facility], [address], [place], [place] [address]  I-Stat Lactic Acid, ED     Status: None   Collection Time: 09/18/23  6:45 PM  Result Value Ref Range   Lactic Acid, Venous 0.8 0.5 - 1.9 mmol/L    DG Foot Complete Right Result Date: 09/18/2023 CLINICAL DATA:  Osteomyelitis EXAM: RIGHT FOOT COMPLETE - 3+ VIEW COMPARISON:  06/22/2021 FINDINGS: There is chronic erosive changes involving the distal aspect of the proximal phalanx of the great toe as well as, to a lesser extent, the base of the distal phalanx of the great toe in keeping with changes of chronic osteomyelitis. There is subcutaneous gas seen within the medial aspect of the great toe in keeping with aggressive infection and/or gangrene. Extensive surrounding soft tissue swelling. Ulcer within the terminal aspect of the left great toe noted with possible  exposure of the distal cortex of the distal phalanx at the base of the wound. No acute fracture or dislocation. Remaining joint spaces are preserved. Tiny plantar calcaneal spur. IMPRESSION: 1. Chronic osteomyelitis of the proximal and distal phalanges of the great toe. 2. Subcutaneous gas within the medial aspect of the great toe in  keeping with aggressive infection and/or gangrene. 3. Terminal ulcer of the second digit with possible exposure of the distal phalanx of the base of the wound. Electronically Signed   By: Dorethia Molt M.D.   On: 09/18/2023 19:32    Review of Systems  Constitutional:  Positive for fever. Negative for chills and malaise/fatigue.  Respiratory:  Negative for shortness of breath.   Cardiovascular:  Negative for chest pain.  Gastrointestinal:  Negative for diarrhea.  All other systems reviewed and are negative.  Blood pressure (!) 160/84, pulse 79, temperature 98.3 F (36.8 C), temperature source Oral, resp. rate 16, weight 124.7 kg, SpO2 99%.  Vitals:   09/19/23 0630 09/19/23 0636  BP: (!) 160/84   Pulse: 79   Resp: 16   Temp:  98.3 F (36.8 C)  SpO2: 99%     General AA&O x3. Normal mood and affect.  Vascular Dorsalis pedis and posterior tibial pulses  present 2+ right  Capillary refill normal to all digits. Pedal hair growth normal.  Neurologic Epicritic sensation grossly absent.  Dermatologic (Wound) Healing hemorrhagic blister around right hallux, nail has been removed, loss of second toe nail, there is no ulceration, no exposed bone tendon there is no drainage no cellulitis no warmth  Orthopedic: Motor intact BLE.    Assessment/Plan:  Right hallux nailbed infection -Imaging: Studies independently reviewed.  The erosions present noted by radiology likely are postsurgical from previous right hallux surgery, suspected new erosions are degenerative as opposed to infectious considering his clinical picture which is unimpressive for infection.  Recommended  MRI to evaluate for osteomyelitis and this has been ordered -Antibiotics: Can continue broad-spectrum antibiotics for now -WB Status: Weightbearing as tolerated -Surgical Plan: Pending MRI  [name] Medicine 09/19/2023, 7:35 AM   Best available via secure chat for questions or concerns.

## 2023-09-19 NOTE — Plan of Care (Signed)
   Problem: Education: Goal: Knowledge of General Education information will improve Description Including pain rating scale, medication(s)/side effects and non-pharmacologic comfort measures Outcome: Progressing   Problem: Health Behavior/Discharge Planning: Goal: Ability to manage health-related needs will improve Outcome: Progressing

## 2023-09-20 ENCOUNTER — Inpatient Hospital Stay (HOSPITAL_COMMUNITY): Payer: Self-pay | Admitting: Anesthesiology

## 2023-09-20 ENCOUNTER — Inpatient Hospital Stay (HOSPITAL_COMMUNITY)

## 2023-09-20 ENCOUNTER — Encounter (HOSPITAL_COMMUNITY)
Admission: EM | Disposition: A | Discharge status: Home / Self Care | Payer: Self-pay | Source: Ambulatory Visit | Attending: Internal Medicine

## 2023-09-20 DIAGNOSIS — E11628 Type 2 diabetes mellitus with other skin complications: Secondary | ICD-10-CM | POA: Diagnosis not present

## 2023-09-20 DIAGNOSIS — L089 Local infection of the skin and subcutaneous tissue, unspecified: Secondary | ICD-10-CM | POA: Diagnosis not present

## 2023-09-20 DIAGNOSIS — M869 Osteomyelitis, unspecified: Secondary | ICD-10-CM

## 2023-09-20 DIAGNOSIS — G4733 Obstructive sleep apnea (adult) (pediatric): Secondary | ICD-10-CM

## 2023-09-20 DIAGNOSIS — I10 Essential (primary) hypertension: Secondary | ICD-10-CM | POA: Diagnosis not present

## 2023-09-20 DIAGNOSIS — E1169 Type 2 diabetes mellitus with other specified complication: Secondary | ICD-10-CM | POA: Diagnosis not present

## 2023-09-20 LAB — CBC
HCT: 37.2 % — ABNORMAL LOW (ref 39.0–52.0)
Hemoglobin: 12.6 g/dL — ABNORMAL LOW (ref 13.0–17.0)
MCH: 31 pg (ref 26.0–34.0)
MCHC: 33.9 g/dL (ref 30.0–36.0)
MCV: 91.6 fL (ref 80.0–100.0)
Platelets: 283 K/uL (ref 150–400)
RBC: 4.06 MIL/uL — ABNORMAL LOW (ref 4.22–5.81)
RDW: 12.6 % (ref 11.5–15.5)
WBC: 8.1 K/uL (ref 4.0–10.5)
nRBC: 0 % (ref 0.0–0.2)

## 2023-09-20 LAB — COMPREHENSIVE METABOLIC PANEL WITH GFR
ALT: 9 U/L (ref 0–44)
AST: 11 U/L — ABNORMAL LOW (ref 15–41)
Albumin: 3.3 g/dL — ABNORMAL LOW (ref 3.5–5.0)
Alkaline Phosphatase: 53 U/L (ref 38–126)
Anion gap: 10 (ref 5–15)
BUN: 31 mg/dL — ABNORMAL HIGH (ref 6–20)
CO2: 21 mmol/L — ABNORMAL LOW (ref 22–32)
Calcium: 9.1 mg/dL (ref 8.9–10.3)
Chloride: 107 mmol/L (ref 98–111)
Creatinine, Ser: 2.35 mg/dL — ABNORMAL HIGH (ref 0.61–1.24)
GFR, Estimated: 34 mL/min — ABNORMAL LOW (ref >60)
Glucose, Bld: 133 mg/dL — ABNORMAL HIGH (ref 70–99)
Potassium: 4.4 mmol/L (ref 3.5–5.1)
Sodium: 139 mmol/L (ref 135–145)
Total Bilirubin: 0.4 mg/dL (ref 0.0–1.2)
Total Protein: 6.4 g/dL — ABNORMAL LOW (ref 6.5–8.1)

## 2023-09-20 LAB — GLUCOSE, CAPILLARY
Glucose-Capillary: 138 mg/dL — ABNORMAL HIGH (ref 70–99)
Glucose-Capillary: 112 mg/dL — ABNORMAL HIGH (ref 70–99)
Glucose-Capillary: 123 mg/dL — ABNORMAL HIGH (ref 70–99)
Glucose-Capillary: 200 mg/dL — ABNORMAL HIGH (ref 70–99)
Glucose-Capillary: 119 mg/dL — ABNORMAL HIGH (ref 70–99)

## 2023-09-20 LAB — HIV ANTIBODY (ROUTINE TESTING W REFLEX): HIV Screen 4th Generation wRfx: NONREACTIVE

## 2023-09-20 SURGERY — BIOPSY, BONE
Anesthesia: Monitor Anesthesia Care | Site: First Toe | Laterality: Right

## 2023-09-20 MED ORDER — DEXAMETHASONE SODIUM PHOSPHATE 10 MG/ML IJ SOLN
INTRAMUSCULAR | Status: AC
  Filled 2023-09-20: qty 1

## 2023-09-20 MED ORDER — PROPOFOL 500 MG/50ML IV EMUL
INTRAVENOUS | Status: DC | PRN
  Administered 2023-09-20: 10 mg via INTRAVENOUS
  Administered 2023-09-20: 20 mg via INTRAVENOUS
  Administered 2023-09-20: 75 ug/kg/min via INTRAVENOUS

## 2023-09-20 MED ORDER — MORPHINE SULFATE (PF) 2 MG/ML IV SOLN
2.0000 mg | INTRAVENOUS | Status: DC | PRN
  Administered 2023-09-20 – 2023-09-21 (×3): 2 mg via INTRAVENOUS
  Filled 2023-09-20 (×3): qty 1

## 2023-09-20 MED ORDER — MIDAZOLAM HCL 5 MG/5ML IJ SOLN
INTRAMUSCULAR | Status: DC | PRN
  Administered 2023-09-20: 2 mg via INTRAVENOUS

## 2023-09-20 MED ORDER — LIDOCAINE HCL (CARDIAC) PF 100 MG/5ML IV SOSY
PREFILLED_SYRINGE | INTRAVENOUS | Status: DC | PRN
Start: 2023-09-20 — End: 2023-09-20
  Administered 2023-09-20: 40 mg via INTRAVENOUS

## 2023-09-20 MED ORDER — TRAMADOL HCL 50 MG PO TABS
50.0000 mg | ORAL_TABLET | Freq: Four times a day (QID) | ORAL | Status: DC | PRN
  Administered 2023-09-20 – 2023-09-22 (×4): 50 mg via ORAL
  Filled 2023-09-20 (×5): qty 1

## 2023-09-20 MED ORDER — ONDANSETRON HCL 4 MG/2ML IJ SOLN
INTRAMUSCULAR | Status: AC
  Filled 2023-09-20: qty 4

## 2023-09-20 MED ORDER — BUPIVACAINE HCL (PF) 0.5 % IJ SOLN
INTRAMUSCULAR | Status: AC
  Filled 2023-09-20: qty 30

## 2023-09-20 MED ORDER — MIDAZOLAM HCL 2 MG/2ML IJ SOLN
INTRAMUSCULAR | Status: AC
  Filled 2023-09-20: qty 2

## 2023-09-20 MED ORDER — FENTANYL CITRATE PF 50 MCG/ML IJ SOSY: 25.0000 ug | PREFILLED_SYRINGE | INTRAMUSCULAR | Status: DC | PRN

## 2023-09-20 MED ORDER — LINEZOLID 600 MG PO TABS
600.0000 mg | ORAL_TABLET | Freq: Two times a day (BID) | ORAL | Status: DC
  Administered 2023-09-20 – 2023-09-22 (×4): 600 mg via ORAL
  Filled 2023-09-20 (×5): qty 1

## 2023-09-20 MED ORDER — ACETAMINOPHEN 500 MG PO TABS
ORAL_TABLET | ORAL | Status: AC
  Filled 2023-09-20: qty 1

## 2023-09-20 MED ORDER — ONDANSETRON HCL 4 MG/2ML IJ SOLN
INTRAMUSCULAR | Status: DC | PRN
  Administered 2023-09-20: 4 mg via INTRAVENOUS

## 2023-09-20 MED ORDER — BUPIVACAINE HCL (PF) 0.5 % IJ SOLN
INTRAMUSCULAR | Status: DC | PRN
  Administered 2023-09-20: 20 mL

## 2023-09-20 MED ORDER — VANCOMYCIN HCL 1000 MG IV SOLR
INTRAVENOUS | Status: AC
  Filled 2023-09-20: qty 20

## 2023-09-20 MED ORDER — LIDOCAINE HCL (PF) 2 % IJ SOLN
INTRAMUSCULAR | Status: AC
  Filled 2023-09-20: qty 10

## 2023-09-20 MED ORDER — DROPERIDOL 2.5 MG/ML IJ SOLN: 0.6250 mg | Freq: Once | INTRAMUSCULAR | Status: DC | PRN

## 2023-09-20 MED ORDER — OXYCODONE HCL 5 MG PO TABS: 5.0000 mg | ORAL_TABLET | Freq: Once | ORAL | Status: DC | PRN

## 2023-09-20 MED ORDER — OXYCODONE HCL 5 MG/5ML PO SOLN: 5.0000 mg | Freq: Once | ORAL | Status: DC | PRN

## 2023-09-20 MED ORDER — 0.9 % SODIUM CHLORIDE (POUR BTL) OPTIME
TOPICAL | Status: DC | PRN
  Administered 2023-09-20: 1000 mL

## 2023-09-20 SURGICAL SUPPLY — 35 items
BAG COUNTER SPONGE SURGICOUNT (BAG) IMPLANT
BLADE OSC/SAGITTAL MD 5.5X18 (BLADE) IMPLANT
BLADE SURG 15 STRL LF DISP TIS (BLADE) IMPLANT
BNDG CONFORM 4 STRL LF (GAUZE/BANDAGES/DRESSINGS) ×3 IMPLANT
BNDG ELASTIC 4INX 5YD STR LF (GAUZE/BANDAGES/DRESSINGS) ×1 IMPLANT
BNDG ELASTIC 6INX 5YD STR LF (GAUZE/BANDAGES/DRESSINGS) ×3 IMPLANT
BNDG STRETCH GAUZE 3IN X12FT (GAUZE/BANDAGES/DRESSINGS) ×1 IMPLANT
CLEANER TIP ELECTROSURG 2X2 (MISCELLANEOUS) IMPLANT
CNTNR URN SCR LID CUP LEK RST (MISCELLANEOUS) IMPLANT
COVER SURGICAL LIGHT HANDLE (MISCELLANEOUS) ×3 IMPLANT
CUFF TRNQT CYL 24X4X16.5-23 (TOURNIQUET CUFF) IMPLANT
GAUZE SPONGE 4X4 12PLY STRL (GAUZE/BANDAGES/DRESSINGS) ×3 IMPLANT
GAUZE XEROFORM 1X8 LF (GAUZE/BANDAGES/DRESSINGS) ×3 IMPLANT
GLOVE BIOGEL M 7.0 STRL (GLOVE) ×3 IMPLANT
GLOVE BIOGEL PI IND STRL 7.5 (GLOVE) ×3 IMPLANT
GOWN STRL REUS W/ TWL XL LVL3 (GOWN DISPOSABLE) ×3 IMPLANT
KIT BASIN OR (CUSTOM PROCEDURE TRAY) ×3 IMPLANT
MANIFOLD NEPTUNE II (INSTRUMENTS) ×3 IMPLANT
NDL HYPO 25X1 1.5 SAFETY (NEEDLE) ×2 IMPLANT
NDL SAFETY ECLIPSE 18X1.5 (NEEDLE) IMPLANT
NEEDLE HYPO 25X1 1.5 SAFETY (NEEDLE) ×2 IMPLANT
PACK ORTHO EXTREMITY (CUSTOM PROCEDURE TRAY) ×3 IMPLANT
PADDING UNDERCAST 2X4 STRL (CAST SUPPLIES) ×3 IMPLANT
SPIKE FLUID TRANSFER (MISCELLANEOUS) IMPLANT
STAPLER SKIN PROX 35W (STAPLE) IMPLANT
SUT ETHILON 4 0 PS 2 18 (SUTURE) ×3 IMPLANT
SUT MNCRL AB 4-0 PS2 18 (SUTURE) IMPLANT
SUT VIC AB 4-0 PS2 27 (SUTURE) IMPLANT
SWAB COLLECTION DEVICE MRSA (MISCELLANEOUS) IMPLANT
SWAB CULTURE ESWAB REG 1ML (MISCELLANEOUS) IMPLANT
SYR 20ML LL LF (SYRINGE) IMPLANT
TOWEL GREEN STERILE FF (TOWEL DISPOSABLE) ×3 IMPLANT
TOWEL OR 17X26 10 PK STRL BLUE (TOWEL DISPOSABLE) ×3 IMPLANT
UNDERPAD 30X36 HEAVY ABSORB (UNDERPADS AND DIAPERS) ×6 IMPLANT
YANKAUER SUCT BULB TIP 10FT TU (MISCELLANEOUS) IMPLANT

## 2023-09-20 NOTE — Progress Notes (Signed)
 Orthopedic Tech Progress Note Patient Details:  Kasper Mudrick 07-05-1975 [id number] Post-op shoe was sized and left at bedside.  Ortho Devices Type of Ortho Device: Postop shoe/boot Ortho Device/Splint Location: Right foot Ortho Device/Splint Interventions: Application   Post Interventions Patient Tolerated: Well  Massie BRAVO Andromeda Poppen 09/20/2023, 2:25 PM

## 2023-09-20 NOTE — Progress Notes (Signed)
 History and Physical Interval Note:  09/20/2023 10:00 AM  Barry [name]  has presented today for surgery, with the diagnosis of osteomyelitis.  The various methods of treatment have been discussed with the patient and family. After consideration of risks, benefits and other options for treatment, the patient has consented to   Procedure(s): AMPUTATION, TOE (Right)  as a surgical intervention.  The patient's history has been reviewed, patient examined, no change in status, stable for surgery.  I have reviewed the patient's chart and labs.  Questions were answered to the patient's satisfaction.     [name] Medicine

## 2023-09-20 NOTE — Transfer of Care (Signed)
 Immediate Anesthesia Transfer of Care Note  Patient: Barry [name]  Procedure(s) Performed: Procedure(s): BIOPSY, BONE (Left)  Patient Location: PACU  Anesthesia Type:MAC  Level of Consciousness:  sedated, patient cooperative and responds to stimulation  Airway & Oxygen Therapy:Patient Spontanous Breathing and Patient connected to face mask oxgen  Post-op Assessment:  Report given to PACU RN and Post -op Vital signs reviewed and stable  Post vital signs:  Reviewed and stable  Last Vitals:  Vitals:   09/20/23 0602 09/20/23 0951  BP: (!) 165/96 (!) 164/107  Pulse: 87 89  Resp: 18 18  Temp: 36.9 C   SpO2: 94% 99%    Complications: No apparent anesthesia complications

## 2023-09-20 NOTE — Plan of Care (Signed)
   Problem: Education: Goal: Knowledge of General Education information will improve Description Including pain rating scale, medication(s)/side effects and non-pharmacologic comfort measures Outcome: Progressing   Problem: Health Behavior/Discharge Planning: Goal: Ability to manage health-related needs will improve Outcome: Progressing

## 2023-09-20 NOTE — Progress Notes (Signed)
 PROGRESS NOTE    Barry [name]  FMW:[id number] DOB: 1975-08-13 DOA: 09/18/2023 PCP: Barry Glisson, MD    Brief Narrative:   Barry [name] is a 48 y.o. male with past medical history significant for DM2 with peripheral neuropathy, CKD stage IIIb, HTN prior right great toe surgery, dental caries, cataract with myopia, morbid obesity presented to [facility] ED by direction from urgent care center due to concern for right foot infection and osteomyelitis.  Patient presented to urgent care due to his right great toe looking dark with toenails falling off.  Has chronic neuropathic pain which is unchanged from baseline.  Also noticed some foul-smelling discharge.  Endorses chills but no fevers.  Additionally he reports nausea and vomiting but attributes to Ozempic  use.  Denies abdominal pain, no shortness of breath, no chest pain, no urinary symptoms.  In the ED, temperature 98.0 F, HR 85, RR 18, BP 153/96, SpO2 99% on room air.  WBC 6.7, hemoglobin 12.9, platelet count 312.  Sodium 139, potassium 4.4, chloride 107, CO2 22, glucose 120, BUN 30, creatinine 2.58.  AST 16, ALT 12, total bilirubin 0.3.  Lactic acid 0.8.  Urinalysis with greater than 500 glucose, otherwise unrevealing.  Right foot x-ray with chronic osteomyelitis proximal and distal phalanges of the great toe, subcutaneous gas within the medial aspect of the great toe in keeping with aggressive infection or gangrene, terminal ulcer second digit with possible exposure of the distal phalanx of the base of the wound.  MR right foot without contrast with osteomyelitis throughout the great toe and distal phalanx of the second toe, subcu edema of the dorsum of the foot most consistent with dependent change or cellulitis, negative for abscess.  Podiatry was consulted.  [facility] consulted for admission for further evaluation management of right foot osteomyelitis.  Assessment & Plan:   Right great toe osteomyelitis/cellulitis Patient presenting with  progressive changes of his right great toe associated with purulent discharge.  Imaging with MRI concerning for osteomyelitis right great toe and distal phalanx of the second toe with surrounding cellulitis; without abscess formation. -- Podiatry following, appreciate assistance -- Linezolid  to 5 mg p.o. every 12 hours -- Cefepime  2 g IV every 12 hours -- Metronidazole  500 mg IV every 12 hours -- N.p.o. pending surgical invention/amputation by podiatry today  Type 2 diabetes mellitus Hemoglobin A1c 6.9.  At baseline on Ozempic  and Jardiance . --SSI for coverage -- CBG before every meal/at bedtime  HTN -- Carvedilol  6.25 mg p.o. twice daily -- Holding losartan  for now --Hydralazine  25 mg PO q6h PRN SBP >160 or DBP >110 -- Continue monitor BP closely  CKD stage IIIb Baseline creatinine 2.3-2.6, stable. -- Cr 2.58>2.35 -- LR 75 mL/h -- Holding losartan  as above -- BMP in the a.m.  History of gout -- Allopurinol  50 mg p.o. daily  Dental caries -- Antibiotics as above -- Outpatient follow-up with dental medicine for extraction  Obesity, class I Body mass index is 34.37 kg/m.   DVT prophylaxis: Lovenox     Code Status: Full Code Family Communication: Family present at bedside  Disposition Plan:  Level of care: Med-Surg Status is: Inpatient Remains inpatient appropriate because: Surgical invention/amputation today    Consultants:  Podiatry, Barry [name]  Procedures:  Pending amputation by podiatry today  Antimicrobials:  Linezolid  8/30>> Cefepime  8/30>> Metronidazole  8/30>> Vancomycin  8/29 - 8/29 Zosyn  8/29 - 8/20   Subjective: Patient seen examined bedside, lying in bed.  Family present.  Awaiting for operative management/amputation later this morning by podiatry.  No other Barry [name] questions, concerns or complaints at this time.  Denies headache, no visual changes, no chest pain, no palpitations, no shortness of breath, no abdominal pain, no fever/chills/night  sweats, no nausea/vomiting/diarrhea, no focal weakness, no fatigue, no paresthesias.  No acute events overnight per nurse staff.  Objective: Vitals:   09/20/23 0951 09/20/23 1042 09/20/23 1043 09/20/23 1045  BP: (!) 164/107   (!) 150/98  Pulse: 89   87  Resp: 18 (!) 22  (!) 23  Temp:   (P) 97.6 F (36.4 C)   TempSrc:      SpO2: 99%   99%  Weight:        Intake/Output Summary (Last 24 hours) at 09/20/2023 1051 Last data filed at 09/20/2023 1036 Gross per 24 hour  Intake 1762.92 ml  Output 650 ml  Net 1112.92 ml   Filed Weights   09/18/23 2339  Weight: 124.7 kg    Examination:  Physical Exam: GEN: NAD, alert and oriented x 3, obese HEENT: NCAT, PERRL, EOMI, sclera clear, MMM PULM: CTAB w/o wheezes/crackles, normal respiratory effort, room air CV: RRR w/o M/G/R GI: abd soft, NTND, + BS MSK: no peripheral edema, moves all extremity dependently NEURO: CN II-XII intact, no focal deficits, sensation to light touch intact PSYCH: normal mood/affect Integumentary: Right great toe as depicted below, otherwise no other concerning rashes/lesions/wounds noted on exposed skin surfaces      Data Reviewed: I have personally reviewed following labs and imaging studies  CBC: Recent Labs  Lab 09/18/23 1839 09/20/23 0404  WBC 6.7 8.1  NEUTROABS 3.3  --   HGB 12.9* 12.6*  HCT 39.1 37.2*  MCV 92.7 91.6  PLT 312 283   Basic Metabolic Panel: Recent Labs  Lab 09/18/23 1839 09/20/23 0404  NA 139 139  K 4.4 4.4  CL 107 107  CO2 22 21*  GLUCOSE 120* 133*  BUN 30* 31*  CREATININE 2.58* 2.35*  CALCIUM  8.7* 9.1   GFR: CrCl cannot be calculated (Unknown ideal weight.). Liver Function Tests: Recent Labs  Lab 09/18/23 1839 09/20/23 0404  AST 16 11*  ALT 12 9  ALKPHOS 56 53  BILITOT 0.3 0.4  PROT 6.8 6.4*  ALBUMIN 3.5 3.3*   No results for input(s): LIPASE, AMYLASE in the last 168 hours. No results for input(s): AMMONIA in the last 168 hours. Coagulation  Profile: No results for input(s): INR, PROTIME in the last 168 hours. Cardiac Enzymes: No results for input(s): CKTOTAL, CKMB, CKMBINDEX, TROPONINI in the last 168 hours. BNP (last 3 results) No results for input(s): PROBNP in the last 8760 hours. HbA1C: Recent Labs    09/19/23 0944  HGBA1C 6.9*   CBG: Recent Labs  Lab 09/19/23 1205 09/19/23 1649 09/19/23 2107 09/20/23 0748 09/20/23 1045  GLUCAP 129* 100* 147* 138* 112*   Lipid Profile: No results for input(s): CHOL, HDL, LDLCALC, TRIG, CHOLHDL, LDLDIRECT in the last 72 hours. Thyroid  Function Tests: No results for input(s): TSH, T4TOTAL, FREET4, T3FREE, THYROIDAB in the last 72 hours. Anemia Panel: No results for input(s): VITAMINB12, FOLATE, FERRITIN, TIBC, IRON, RETICCTPCT in the last 72 hours. Sepsis Labs: Recent Labs  Lab 09/18/23 1845  LATICACIDVEN 0.8    Recent Results (from the past 240 hours)  Surgical PCR screen     Status: None   Collection Time: 09/19/23  9:58 PM   Specimen: Nasal Mucosa; Nasal Swab  Result Value Ref Range Status   MRSA, PCR NEGATIVE NEGATIVE Final   Staphylococcus aureus NEGATIVE NEGATIVE Final  Comment: (NOTE) The Xpert SA Assay (FDA approved for NASAL specimens in patients 86 years of age and older), is one component of a comprehensive surveillance program. It is not intended to diagnose infection nor to guide or monitor treatment. Performed at [facility], [address], [place], [place] [address]          Radiology Studies: MR FOOT RIGHT WO CONTRAST Result Date: 09/19/2023 CLINICAL DATA:  Right foot pain and swelling in a diabetic patient. History of prior amputation at the proximal phalanx of the great toe. EXAM: MRI OF THE RIGHT FOREFOOT WITHOUT CONTRAST TECHNIQUE: Multiplanar, multisequence MR imaging of the right forefoot was performed. No intravenous contrast was administered. COMPARISON:  Plain films  right foot 09/18/2023. FINDINGS: Bones/Joint/Cartilage Postoperative change of amputation of the mid and distal proximal phalanx of the great toe is identified as seen on plain films. Marrow edema is seen throughout the distal phalanx of the second toe and throughout the distal phalanx and remnant of the proximal phalanx of the great toe consistent with osteomyelitis. No other evidence of osteomyelitis is identified. There is degenerative disease of the midfoot most notable at the first and second tarsometatarsal joints. Mild marrow edema about the second TMT joint extending into the diaphysis of the second metatarsal is most consistent with secondary stress change. 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 Ligaments Intact. Muscles and Tendons Intact. No intramuscular fluid collection. Increased T2 signal in intrinsic musculature of the foot is most consistent with diabetic myopathy. Soft tissues No focal fluid collection is identified. There is subcutaneous edema about the dorsum of the foot most consistent with dependent change or cellulitis. IMPRESSION: 1. Osteomyelitis throughout the great toe and distal phalanx of the second toe. 2. Subcutaneous edema about the dorsum of the foot most consistent with dependent change or cellulitis. Negative for abscess. 3. Degenerative disease of the midfoot most notable at the first and second tarsometatarsal joints. Mild marrow edema about the second TMT joint extending into the diaphysis of the second metatarsal is most consistent with secondary stress change. 4. First MTP osteoarthritis. Electronically Signed   By: Debby Prader M.D.   On: 09/19/2023 10:25   DG Foot Complete Right Result Date: 09/18/2023 CLINICAL DATA:  Osteomyelitis EXAM: RIGHT FOOT COMPLETE - 3+ VIEW COMPARISON:  06/22/2021 FINDINGS: There is chronic erosive changes involving the distal aspect of the proximal phalanx of the great toe as well as, to a lesser extent, the base of the distal phalanx of  the great toe in keeping with changes of chronic osteomyelitis. There is subcutaneous gas seen within the medial aspect of the great toe in keeping with aggressive infection and/or gangrene. Extensive surrounding soft tissue swelling. Ulcer within the terminal aspect of the left great toe noted with possible exposure of the distal cortex of the distal phalanx at the base of the wound. No acute fracture or dislocation. Remaining joint spaces are preserved. Tiny plantar calcaneal spur. IMPRESSION: 1. Chronic osteomyelitis of the proximal and distal phalanges of the great toe. 2. Subcutaneous gas within the medial aspect of the great toe in keeping with aggressive infection and/or gangrene. 3. Terminal ulcer of the second digit with possible exposure of the distal phalanx of the base of the wound. Electronically Signed   By: Dorethia Molt M.D.   On: 09/18/2023 19:32        Scheduled Meds:  [MAR Hold] allopurinol   50 mg Oral Daily   [MAR Hold] carvedilol   6.25 mg Oral BID  WC   [MAR Hold] enoxaparin  (LOVENOX ) injection  60 mg Subcutaneous Daily   [MAR Hold] insulin  aspart  0-5 Units Subcutaneous QHS   [MAR Hold] insulin  aspart  0-9 Units Subcutaneous TID WC   [MAR Hold] linezolid   600 mg Oral Q12H   [MAR Hold] mupirocin  ointment  1 Application Nasal BID   Continuous Infusions:  [MAR Hold] ceFEPime  (MAXIPIME ) IV 2 g (09/19/23 2233)   lactated ringers  75 mL/hr at 09/20/23 1003   [MAR Hold] metronidazole  500 mg (09/19/23 2325)     LOS: 1 day    Time spent: 52 minutes spent on 09/20/2023 caring for this patient face-to-face including chart review, ordering labs/tests, documenting, discussion with nursing staff, consultants, updating family and interview/physical exam    Camellia PARAS [place], DO [facility] Available via Epic secure chat 7am-7pm After these hours, please refer to coverage provider listed on [email or web address] 09/20/2023, 10:51 AM

## 2023-09-20 NOTE — Op Note (Signed)
 Patient Name: Barry [name] DOB: [birth year]  MRN: [id number]   Date of Service: 09/18/2023 - 09/20/2023  Surgeon: Dr. Juliene Medicine, DPM Assistants: None Pre-operative Diagnosis:  OSTEOMYELITIS Post-operative Diagnosis:  OSTEOMYELITIS Procedures: Procedures:   * BIOPSY, BONE Pathology/Specimens: ID Type Source Tests Collected by Time Destination  1 : right hallux bone biopsy Tissue PATH Bone biopsy SURGICAL PATHOLOGY Medicine Juliene SAUNDERS, DPM 09/20/2023 1029   A : Deep wound right great toe Wound Foot, Right AEROBIC/ANAEROBIC CULTURE W GRAM STAIN (SURGICAL/DEEP WOUND) Medicine Juliene SAUNDERS, DPM 09/20/2023 1026   B : right hallux tissue culture Tissue Path Tissue AEROBIC/ANAEROBIC CULTURE W GRAM STAIN (SURGICAL/DEEP WOUND) Medicine Juliene SAUNDERS, DPM 09/20/2023 1028    Anesthesia: MAC with local Hemostasis: * No tourniquets in log * Estimated Blood Loss: 5 cc Materials: * No implants in log * Medications: 20 cc of 0.5% Marcaine  plain Complications: No complication noted  Indications for Procedure:  This is a 48 y.o. male with a history of right hallux nailbed infection, MRI was concerning for osteomyelitis.   Procedure in Detail: Patient was identified in pre-operative holding area. Formal consent was signed and the right lower extremity was marked. Patient was brought back to the operating room. Anesthesia was induced. The extremity was prepped and draped in the usual sterile fashion. Timeout was taken to confirm patient name, laterality, and procedure prior to incision.   Attention was then directed to the right hallux where a linear incision was made over the interphalangeal joint.  Dissection was carried down to bone in the proximal and base of the distal phalanx were exposed.  The joint was entered.  There was no purulence or necrosis.  The bone appeared healthy.  A swab of the joint was taken as a deep wound culture.  Bone biopsies from the proximal and distal phalanx were taken and sent for tissue  culture and pathology.  At this point given the clinical appearance I elected to terminate the procedure and we will treat with antibiotic therapy as I felt amputation is not necessary at this time.  The wound was irrigated thoroughly.  He states this was achieved.  It was closed with 4-0 nylon.  The foot was then dressed with Xeroform and dry sterile dressings. Patient tolerated the procedure well.   Disposition: Following a period of post-operative monitoring, patient will be transferred to the floor.  He may be weightbearing as tolerated.  Pathology and culture results may be used to determine length and duration of therapy.  At this point no further surgical plans are indicated at this time

## 2023-09-20 NOTE — Progress Notes (Signed)
 The dressing on the patient's right foot was coming off with new bloody drainage noted. RN assessed and was unable to reinforce the dressing, so it was removed completely. The surgical site was intact with no active bleeding observed. A new dressing was applied using Xeroform and dry sterile dressings. The patient tolerated well. The patient was educated to use the post-op boot when standing and walking. The on-call provider was notified.

## 2023-09-20 NOTE — Anesthesia Postprocedure Evaluation (Signed)
 Anesthesia Post Note  Patient: [name]  Procedure(s) Performed: BIOPSY, BONE (Right: First Toe)     Patient location during evaluation: PACU Anesthesia Type: MAC Level of consciousness: awake and alert Pain management: pain level controlled Vital Signs Assessment: post-procedure vital signs reviewed and stable Respiratory status: spontaneous breathing, nonlabored ventilation and respiratory function stable Cardiovascular status: blood pressure returned to baseline Postop Assessment: no apparent nausea or vomiting Anesthetic complications: no   No notable events documented.  Last Vitals:  Vitals:   09/20/23 1043 09/20/23 1045  BP:  (!) 150/98  Pulse:  87  Resp:  (!) 23  Temp: 36.4 C   SpO2:  99%    Last Pain:  Vitals:   09/20/23 1042  TempSrc:   PainSc: 0-No pain                 Vertell Row

## 2023-09-21 ENCOUNTER — Encounter (HOSPITAL_COMMUNITY): Payer: Self-pay | Admitting: Podiatry

## 2023-09-21 DIAGNOSIS — E11628 Type 2 diabetes mellitus with other skin complications: Secondary | ICD-10-CM | POA: Diagnosis not present

## 2023-09-21 DIAGNOSIS — L089 Local infection of the skin and subcutaneous tissue, unspecified: Secondary | ICD-10-CM | POA: Diagnosis not present

## 2023-09-21 LAB — BASIC METABOLIC PANEL WITH GFR
Anion gap: 11 (ref 5–15)
BUN: 32 mg/dL — ABNORMAL HIGH (ref 6–20)
CO2: 21 mmol/L — ABNORMAL LOW (ref 22–32)
Calcium: 9 mg/dL (ref 8.9–10.3)
Chloride: 107 mmol/L (ref 98–111)
Creatinine, Ser: 2.56 mg/dL — ABNORMAL HIGH (ref 0.61–1.24)
GFR, Estimated: 31 mL/min — ABNORMAL LOW (ref >60)
Glucose, Bld: 130 mg/dL — ABNORMAL HIGH (ref 70–99)
Potassium: 4.6 mmol/L (ref 3.5–5.1)
Sodium: 140 mmol/L (ref 135–145)

## 2023-09-21 LAB — CBC
HCT: 38.1 % — ABNORMAL LOW (ref 39.0–52.0)
Hemoglobin: 12.5 g/dL — ABNORMAL LOW (ref 13.0–17.0)
MCH: 30.3 pg (ref 26.0–34.0)
MCHC: 32.8 g/dL (ref 30.0–36.0)
MCV: 92.3 fL (ref 80.0–100.0)
Platelets: 290 K/uL (ref 150–400)
RBC: 4.13 MIL/uL — ABNORMAL LOW (ref 4.22–5.81)
RDW: 12.9 % (ref 11.5–15.5)
WBC: 8.9 K/uL (ref 4.0–10.5)
nRBC: 0 % (ref 0.0–0.2)

## 2023-09-21 LAB — GLUCOSE, CAPILLARY
Glucose-Capillary: 137 mg/dL — ABNORMAL HIGH (ref 70–99)
Glucose-Capillary: 116 mg/dL — ABNORMAL HIGH (ref 70–99)
Glucose-Capillary: 100 mg/dL — ABNORMAL HIGH (ref 70–99)
Glucose-Capillary: 119 mg/dL — ABNORMAL HIGH (ref 70–99)

## 2023-09-21 MED ORDER — AMLODIPINE BESYLATE 10 MG PO TABS
10.0000 mg | ORAL_TABLET | Freq: Every day | ORAL | Status: DC
  Administered 2023-09-21 – 2023-09-22 (×2): 10 mg via ORAL
  Filled 2023-09-21 (×2): qty 1

## 2023-09-21 MED ORDER — ONDANSETRON HCL 4 MG/2ML IJ SOLN
4.0000 mg | Freq: Four times a day (QID) | INTRAMUSCULAR | Status: AC | PRN
  Administered 2023-09-21 – 2023-09-22 (×2): 4 mg via INTRAVENOUS
  Filled 2023-09-21 (×2): qty 2

## 2023-09-21 MED ORDER — LOSARTAN POTASSIUM 25 MG PO TABS
25.0000 mg | ORAL_TABLET | Freq: Two times a day (BID) | ORAL | Status: DC
  Administered 2023-09-21 – 2023-09-22 (×3): 25 mg via ORAL
  Filled 2023-09-21 (×3): qty 1

## 2023-09-21 NOTE — Progress Notes (Signed)
 PROGRESS NOTE    Barry [name]  FMW:[id number] DOB: 1975-03-04 DOA: 09/18/2023 PCP: Trinidad Glisson, MD    Brief Narrative:   Barry [name] is a 48 y.o. male with past medical history significant for DM2 with peripheral neuropathy, CKD stage IIIb, HTN prior right great toe surgery, dental caries, cataract with myopia, morbid obesity presented to [facility] ED by direction from urgent care center due to concern for right foot infection and osteomyelitis.  Patient presented to urgent care due to his right great toe looking dark with toenails falling off.  Has chronic neuropathic pain which is unchanged from baseline.  Also noticed some foul-smelling discharge.  Endorses chills but no fevers.  Additionally he reports nausea and vomiting but attributes to Ozempic  use.  Denies abdominal pain, no shortness of breath, no chest pain, no urinary symptoms.  In the ED, temperature 98.0 F, HR 85, RR 18, BP 153/96, SpO2 99% on room air.  WBC 6.7, hemoglobin 12.9, platelet count 312.  Sodium 139, potassium 4.4, chloride 107, CO2 22, glucose 120, BUN 30, creatinine 2.58.  AST 16, ALT 12, total bilirubin 0.3.  Lactic acid 0.8.  Urinalysis with greater than 500 glucose, otherwise unrevealing.  Right foot x-ray with chronic osteomyelitis proximal and distal phalanges of the great toe, subcutaneous gas within the medial aspect of the great toe in keeping with aggressive infection or gangrene, terminal ulcer second digit with possible exposure of the distal phalanx of the base of the wound.  MR right foot without contrast with osteomyelitis throughout the great toe and distal phalanx of the second toe, subcu edema of the dorsum of the foot most consistent with dependent change or cellulitis, negative for abscess.  Podiatry was consulted.  TRH consulted for admission for further evaluation management of right foot osteomyelitis.  Assessment & Plan:   Septic arthritis and cellulitis right great toe interphalangeal joint;  concern for osteomyelitis Patient presenting with progressive changes of his right great toe associated with purulent discharge.  Imaging with MRI concerning for osteomyelitis right great toe and distal phalanx of the second toe with surrounding cellulitis; without abscess formation.  Podiatry was consulted and patient underwent operative culture and bone biopsy by Dr. Silva on 09/20/2023, given operative inspection; amputation was deferred at this time with plan for antibiotic management. -- Podiatry following, appreciate assistance -- Operative culture #1: GPC's on Gram stain, further pending -- Operative culture #2: No organisms on Gram stain, further pending -- Bone biopsy: pending -- WBAT in postoperative/surgical shoe -- Maintain dressing until outpatient follow-up Thursday -- Linezolid  to 600 mg p.o. every 12 hours -- Cefepime  2 g IV every 12 hours -- Metronidazole  500 mg IV every 12 hours  Type 2 diabetes mellitus Hemoglobin A1c 6.9.  At baseline on Ozempic  and Jardiance . --SSI for coverage -- CBG before every meal/at bedtime  HTN -- Carvedilol  6.25 mg p.o. twice daily -- Holding losartan  for now -- Hydralazine  25 mg PO q6h PRN SBP >160 or DBP >110 -- Continue monitor BP closely  CKD stage IIIb Baseline creatinine 2.3-2.6, stable. -- Cr 2.58>2.35>2.56 -- LR 75 mL/h -- Holding losartan  as above -- BMP in the a.m.  History of gout -- Allopurinol  50 mg p.o. daily  Dental caries -- Antibiotics as above -- Outpatient follow-up with dental medicine for extraction  Obesity, class I Body mass index is 34.37 kg/m.   DVT prophylaxis: Lovenox     Code Status: Full Code Family Communication: Family present at bedside  Disposition Plan:  Level of  care: Med-Surg Status is: Inpatient Remains inpatient appropriate because: Surgical invention/amputation today    Consultants:  Podiatry, Dr. Silva  Procedures:  Pending amputation by podiatry today  Antimicrobials:   Linezolid  8/30>> Cefepime  8/30>> Metronidazole  8/30>> Vancomycin  8/29 - 8/29 Zosyn  8/29 - 8/20   Subjective: Patient seen examined bedside, lying in bed.  No family present.  No complaints this morning.  Had some bleeding through dressing last night, changed by nursing staff.  Seen by podiatry this morning.  Remains on IV antibiotics.  No other questions or concerns at this time.  Denies headache, no visual changes, no chest pain, no palpitations, no shortness of breath, no abdominal pain, no fever/chills/night sweats, no nausea/vomiting/diarrhea, no focal weakness, no fatigue, no paresthesias.  No acute events overnight per nursing staff.  Objective: Vitals:   09/20/23 2134 09/20/23 2143 09/21/23 0114 09/21/23 0544  BP: (!) 171/98 (!) 159/97 (!) 154/92 (!) 161/95  Pulse: 79 77 86 92  Resp: 18  18 18   Temp: 97.6 F (36.4 C)  98.1 F (36.7 C) 98 F (36.7 C)  TempSrc: Oral  Oral Oral  SpO2: 98%  94% 96%  Weight:      Height:        Intake/Output Summary (Last 24 hours) at 09/21/2023 1145 Last data filed at 09/21/2023 0600 Gross per 24 hour  Intake 720 ml  Output --  Net 720 ml   Filed Weights   09/18/23 2339  Weight: 124.7 kg    Examination:  Physical Exam: GEN: NAD, alert and oriented x 3, obese HEENT: NCAT, PERRL, EOMI, sclera clear, MMM PULM: CTAB w/o wheezes/crackles, normal respiratory effort, room air CV: RRR w/o M/G/R GI: abd soft, NTND, + BS MSK: no peripheral edema, moves all extremity dependently NEURO: CN II-XII intact, no focal deficits, sensation to light touch intact PSYCH: normal mood/affect Integumentary: Right great toe as depicted below, otherwise no other concerning rashes/lesions/wounds noted on exposed skin surfaces      Data Reviewed: I have personally reviewed following labs and imaging studies  CBC: Recent Labs  Lab 09/18/23 1839 09/20/23 0404 09/21/23 0330  WBC 6.7 8.1 8.9  NEUTROABS 3.3  --   --   HGB 12.9* 12.6* 12.5*  HCT 39.1  37.2* 38.1*  MCV 92.7 91.6 92.3  PLT 312 283 290   Basic Metabolic Panel: Recent Labs  Lab 09/18/23 1839 09/20/23 0404 09/21/23 0330  NA 139 139 140  K 4.4 4.4 4.6  CL 107 107 107  CO2 22 21* 21*  GLUCOSE 120* 133* 130*  BUN 30* 31* 32*  CREATININE 2.58* 2.35* 2.56*  CALCIUM  8.7* 9.1 9.0   GFR: Estimated Creatinine Clearance: 51.9 mL/min (A) (by C-G formula based on SCr of 2.56 mg/dL (H)). Liver Function Tests: Recent Labs  Lab 09/18/23 1839 09/20/23 0404  AST 16 11*  ALT 12 9  ALKPHOS 56 53  BILITOT 0.3 0.4  PROT 6.8 6.4*  ALBUMIN 3.5 3.3*   No results for input(s): LIPASE, AMYLASE in the last 168 hours. No results for input(s): AMMONIA in the last 168 hours. Coagulation Profile: No results for input(s): INR, PROTIME in the last 168 hours. Cardiac Enzymes: No results for input(s): CKTOTAL, CKMB, CKMBINDEX, TROPONINI in the last 168 hours. BNP (last 3 results) No results for input(s): PROBNP in the last 8760 hours. HbA1C: Recent Labs    09/19/23 0944  HGBA1C 6.9*   CBG: Recent Labs  Lab 09/20/23 1357 09/20/23 1641 09/20/23 2136 09/21/23 0734 09/21/23 1140  GLUCAP 123* 200* 119* 137* 116*   Lipid Profile: No results for input(s): CHOL, HDL, LDLCALC, TRIG, CHOLHDL, LDLDIRECT in the last 72 hours. Thyroid  Function Tests: No results for input(s): TSH, T4TOTAL, FREET4, T3FREE, THYROIDAB in the last 72 hours. Anemia Panel: No results for input(s): VITAMINB12, FOLATE, FERRITIN, TIBC, IRON, RETICCTPCT in the last 72 hours. Sepsis Labs: Recent Labs  Lab 09/18/23 1845  LATICACIDVEN 0.8    Recent Results (from the past 240 hours)  Surgical PCR screen     Status: None   Collection Time: 09/19/23  9:58 PM   Specimen: Nasal Mucosa; Nasal Swab  Result Value Ref Range Status   MRSA, PCR NEGATIVE NEGATIVE Final   Staphylococcus aureus NEGATIVE NEGATIVE Final    Comment: (NOTE) The Xpert SA Assay (FDA  approved for NASAL specimens in patients 40 years of age and older), is one component of a comprehensive surveillance program. It is not intended to diagnose infection nor to guide or monitor treatment. Performed at [facility], [address], [place], [place] [address]   Aerobic/Anaerobic Culture w Gram Stain (surgical/deep wound)     Status: None (Preliminary result)   Collection Time: 09/20/23 10:26 AM   Specimen: Foot, Right; Wound  Result Value Ref Range Status   Specimen Description   Final    WOUND RIGHT FOOT (SPECIMEN A) Performed at [facility], [address], [place], [place] [address]    Special Requests   Final    NONE Performed at [facility], [address] [place], [place] [address]    Gram Stain FEW WBC SEEN RARE GRAM POSITIVE COCCI   Final   Culture   Final    NO GROWTH < 24 HOURS Performed at [facility], [address], [place], [place] [address]    Report Status PENDING  Incomplete  Aerobic/Anaerobic Culture w Gram Stain (surgical/deep wound)     Status: None (Preliminary result)   Collection Time: 09/20/23 10:28 AM   Specimen: Path Tissue  Result Value Ref Range Status   Specimen Description   Final    TISSUE RIGHT HALLUX (SPECIMEN B) Performed at [facility], [address], [place], [place] [address]    Special Requests   Final    NONE Performed at [facility], [address], [place], [place] [address]    Gram Stain NO WBC SEEN NO ORGANISMS SEEN   Final   Culture   Final    NO GROWTH < 24 HOURS Performed at [facility], [address], [place], [place] [address]    Report Status PENDING  Incomplete         Radiology Studies: DG Foot Complete Right Result Date: 09/20/2023 CLINICAL DATA:  Postop EXAM: RIGHT FOOT COMPLETE - 3+ VIEW COMPARISON:  09/18/2023. FINDINGS: No acute fracture. Postop changes with previous resection  distal first proximal phalanx. There is a diffuse great toe soft tissue swelling. There is evidence of soft tissue ulceration. No bony destruction. IMPRESSION: Postop changes. Soft tissue swelling and defect or ulceration. Electronically Signed   By: Fonda Field M.D.   On: 09/20/2023 19:04        Scheduled Meds:  allopurinol   50 mg Oral Daily   carvedilol   6.25 mg Oral BID WC   enoxaparin  (LOVENOX ) injection  60 mg Subcutaneous Daily   insulin  aspart  0-5 Units Subcutaneous QHS   insulin  aspart  0-9 Units Subcutaneous TID WC  linezolid   600 mg Oral Q12H   mupirocin  ointment  1 Application Nasal BID   Continuous Infusions:  ceFEPime  (MAXIPIME ) IV 2 g (09/21/23 0944)   metronidazole  500 mg (09/21/23 1029)     LOS: 2 days    Time spent: 52 minutes spent on 09/21/2023 caring for this patient face-to-face including chart review, ordering labs/tests, documenting, discussion with nursing staff, consultants, updating family and interview/physical exam    Camellia PARAS [place], DO [facility] Available via Epic secure chat 7am-7pm After these hours, please refer to coverage provider listed on [email or web address] 09/21/2023, 11:45 AM

## 2023-09-21 NOTE — Progress Notes (Signed)
 Subjective:  Patient ID: Barry [name], male    DOB: 1975-06-10,  MRN: [id number]  POD #1 status post right great toe bone biopsy and washout  Feeling well the dressing had some bleeding through last night and had to be changed by the nurse  Negative for chest pain and shortness of breath Fever: no Night sweats: no Constitutional signs: no Review of all other systems is negative Objective:   Vitals:   09/21/23 0114 09/21/23 0544  BP: (!) 154/92 (!) 161/95  Pulse: 86 92  Resp: 18 18  Temp: 98.1 F (36.7 C) 98 F (36.7 C)  SpO2: 94% 96%   General AA&O x3. Normal mood and affect.  Vascular Dorsalis pedis and posterior tibial pulses 2/4 bilat. Brisk capillary refill to all digits. Pedal hair present.  Neurologic Epicritic sensation grossly reduced.  Dermatologic Dressing with minimal bloody strikethrough dry  Orthopedic: MMT 5/5 in dorsiflexion, plantarflexion, inversion, and eversion. Normal joint ROM without pain or crepitus.   Results for orders placed or performed during the hospital encounter of 09/18/23  Surgical PCR screen     Status: None   Collection Time: 09/19/23  9:58 PM   Specimen: Nasal Mucosa; Nasal Swab  Result Value Ref Range Status   MRSA, PCR NEGATIVE NEGATIVE Final   Staphylococcus aureus NEGATIVE NEGATIVE Final    Comment: (NOTE) The Xpert SA Assay (FDA approved for NASAL specimens in patients 9 years of age and older), is one component of a comprehensive surveillance program. It is not intended to diagnose infection nor to guide or monitor treatment. Performed at [facility], [address] [place], [place] [address]   Aerobic/Anaerobic Culture w Gram Stain (surgical/deep wound)     Status: None (Preliminary result)   Collection Time: 09/20/23 10:26 AM   Specimen: Foot, Right; Wound  Result Value Ref Range Status   Specimen Description   Final    WOUND RIGHT FOOT (SPECIMEN A) Performed at [facility],  [address], [place], [place] [address]    Special Requests   Final    NONE Performed at [facility], [address] [place], [place] [address]    Gram Stain FEW WBC SEEN RARE GRAM POSITIVE COCCI   Final   Culture   Final    NO GROWTH < 24 HOURS Performed at [facility], [address] [place], [place] [address]    Report Status PENDING  Incomplete  Aerobic/Anaerobic Culture w Gram Stain (surgical/deep wound)     Status: None (Preliminary result)   Collection Time: 09/20/23 10:28 AM   Specimen: Path Tissue  Result Value Ref Range Status   Specimen Description   Final    TISSUE RIGHT HALLUX (SPECIMEN B) Performed at [facility], [address], [place], [place] [address]    Special Requests   Final    NONE Performed at [facility], [address], [place], [place] [address]    Gram Stain NO WBC SEEN NO ORGANISMS SEEN   Final   Culture   Final    NO GROWTH < 24 HOURS Performed at [facility], [address] [place], [place] [address]    Report Status PENDING  Incomplete     Pathology results pending    Assessment & Plan:  Patient was evaluated and treated and all questions answered.  POD 1 right great toe bone biopsy - Joint fluid swab culture with GPC's.  No growth on  bone culture.  Is pending. - Should be able to discharge at this point on doxycycline  and Augmentin .  I will follow his cultures and tailor therapy and refer to infectious disease as needed if the pathology results are positive for osteomyelitis, otherwise plan to treat for approximately 4 weeks for essentially septic arthritis of the interphalangeal joint - Will have follow-up with me scheduled on Thursday - Weightbearing as tolerated in surgical shoe - Dressing can stay intact until Thursday follow-up  [name] Medicine, DPM  Accessible via secure chat for questions or concerns.

## 2023-09-21 NOTE — Progress Notes (Signed)
   09/21/23 1104  TOC Brief Assessment  Insurance and Status Reviewed  Patient has primary care physician Yes  Home environment has been reviewed home with family  Prior level of function: independent  Social Drivers of Health Review SDOH reviewed no interventions necessary  Readmission risk has been reviewed Yes  Transition of care needs no transition of care needs at this time

## 2023-09-22 ENCOUNTER — Other Ambulatory Visit (HOSPITAL_BASED_OUTPATIENT_CLINIC_OR_DEPARTMENT_OTHER): Payer: Self-pay

## 2023-09-22 ENCOUNTER — Other Ambulatory Visit (HOSPITAL_COMMUNITY): Payer: Self-pay

## 2023-09-22 ENCOUNTER — Inpatient Hospital Stay (HOSPITAL_COMMUNITY)

## 2023-09-22 ENCOUNTER — Other Ambulatory Visit: Payer: Self-pay

## 2023-09-22 ENCOUNTER — Telehealth: Payer: Self-pay | Admitting: Podiatry

## 2023-09-22 DIAGNOSIS — E11628 Type 2 diabetes mellitus with other skin complications: Secondary | ICD-10-CM | POA: Diagnosis not present

## 2023-09-22 DIAGNOSIS — L089 Local infection of the skin and subcutaneous tissue, unspecified: Secondary | ICD-10-CM | POA: Diagnosis not present

## 2023-09-22 LAB — COMPREHENSIVE METABOLIC PANEL WITH GFR
ALT: 9 U/L (ref 0–44)
AST: 13 U/L — ABNORMAL LOW (ref 15–41)
Albumin: 3.5 g/dL (ref 3.5–5.0)
Alkaline Phosphatase: 59 U/L (ref 38–126)
Anion gap: 11 (ref 5–15)
BUN: 30 mg/dL — ABNORMAL HIGH (ref 6–20)
CO2: 22 mmol/L (ref 22–32)
Calcium: 9.3 mg/dL (ref 8.9–10.3)
Chloride: 104 mmol/L (ref 98–111)
Creatinine, Ser: 2.26 mg/dL — ABNORMAL HIGH (ref 0.61–1.24)
GFR, Estimated: 36 mL/min — ABNORMAL LOW (ref >60)
Glucose, Bld: 137 mg/dL — ABNORMAL HIGH (ref 70–99)
Potassium: 4.4 mmol/L (ref 3.5–5.1)
Sodium: 138 mmol/L (ref 135–145)
Total Bilirubin: 0.5 mg/dL (ref 0.0–1.2)
Total Protein: 7.2 g/dL (ref 6.5–8.1)

## 2023-09-22 LAB — GLUCOSE, CAPILLARY
Glucose-Capillary: 135 mg/dL — ABNORMAL HIGH (ref 70–99)
Glucose-Capillary: 177 mg/dL — ABNORMAL HIGH (ref 70–99)

## 2023-09-22 MED ORDER — LINEZOLID 600 MG PO TABS
600.0000 mg | ORAL_TABLET | Freq: Two times a day (BID) | ORAL | 0 refills | Status: AC
  Filled 2023-09-22: qty 6, 3d supply, fill #0
  Filled 2023-09-22: qty 42, 21d supply, fill #0

## 2023-09-22 MED ORDER — METRONIDAZOLE 500 MG PO TABS
500.0000 mg | ORAL_TABLET | Freq: Two times a day (BID) | ORAL | 0 refills | Status: AC
  Filled 2023-09-22: qty 12, 6d supply, fill #0

## 2023-09-22 MED ORDER — TRAMADOL HCL 50 MG PO TABS
50.0000 mg | ORAL_TABLET | Freq: Four times a day (QID) | ORAL | 0 refills | Status: AC | PRN
  Filled 2023-09-22: qty 20, 5d supply, fill #0

## 2023-09-22 NOTE — Plan of Care (Signed)
  Problem: Safety: Goal: Ability to remain free from injury will improve Outcome: Progressing   Problem: Pain Managment: Goal: General experience of comfort will improve and/or be controlled Outcome: Progressing   Problem: Elimination: Goal: Will not experience complications related to urinary retention Outcome: Progressing   Problem: Activity: Goal: Risk for activity intolerance will decrease Outcome: Progressing

## 2023-09-22 NOTE — Discharge Summary (Signed)
 Physician Discharge Summary  Barry [name] FMW:[id number] DOB: February 08, 1975 DOA: 09/18/2023  PCP: Barry Glisson, MD  Admit date: 09/18/2023 Discharge date: 09/22/2023  Admitted From: Home Disposition: Home  Recommendations for Outpatient Follow-up:  Follow up with PCP in 1-2 weeks Follow-up with podiatry, [name] as scheduled Needs outpatient follow-up with dental medicine for dental caries Continue linezolid  x 4 weeks per podiatry recommendations Continue Flagyl  to complete 10 day course Please continue to monitor CBC while on linezolid  to monitor for pancytopenia Follow-up bone biopsy, operative cultures that were pending at time of discharge  Home Health: No Equipment/Devices: Postoperative/hard sole shoe  Discharge Condition: Stable CODE STATUS: Full code Diet recommendation: Heart healthy/consistent carb richer diet  History of present illness:  Barry [name] is a 48 y.o. male with past medical history significant for DM2 with peripheral neuropathy, CKD stage IIIb, HTN prior right great toe surgery, dental caries, cataract with myopia, morbid obesity presented to [name] by direction from urgent care center due to concern for right foot infection and osteomyelitis.  Patient presented to urgent care due to his right great toe looking dark with toenails falling off.  Has chronic neuropathic pain which is unchanged from baseline.  Also noticed some foul-smelling discharge.  Endorses chills but no fevers.  Additionally he reports nausea and vomiting but attributes to Ozempic  use.  Denies abdominal pain, no shortness of breath, no chest pain, no urinary symptoms.   In the ED, temperature 98.0 F, HR 85, RR 18, BP 153/96, SpO2 99% on room air.  WBC 6.7, hemoglobin 12.9, platelet count 312.  Sodium 139, potassium 4.4, chloride 107, CO2 22, glucose 120, BUN 30, creatinine 2.58.  AST 16, ALT 12, total bilirubin 0.3.  Lactic acid 0.8.  Urinalysis with greater than 500 glucose, otherwise  unrevealing.  Right foot x-ray with chronic osteomyelitis proximal and distal phalanges of the great toe, subcutaneous gas within the medial aspect of the great toe in keeping with aggressive infection or gangrene, terminal ulcer second digit with possible exposure of the distal phalanx of the base of the wound.  MR right foot without contrast with osteomyelitis throughout the great toe and distal phalanx of the second toe, subcu edema of the dorsum of the foot most consistent with dependent change or cellulitis, negative for abscess.  Podiatry was consulted.  [name] consulted for admission for further evaluation management of right foot osteomyelitis.  Hospital course:  Septic arthritis and cellulitis right great toe interphalangeal joint; concern for osteomyelitis Patient presenting with progressive changes of his right great toe associated with purulent discharge.  Imaging with MRI concerning for osteomyelitis right great toe and distal phalanx of the second toe with surrounding cellulitis; without abscess formation.  Podiatry was consulted and patient underwent operative culture and bone biopsy by [name] on 09/20/2023, given operative inspection; amputation was deferred at this time with plan for antibiotic management.  Operative culture with GPC's on Gram stain, further pending at time of discharge.  Continue linezolid  to complete 4-week course, on Flagyl  x 10 days.  Continue monitor CBC to evaluate for pancytopenia while on linezolid .  Outpatient follow-up with podiatry 1 week.  Weightbearing as tolerates with postoperative/[place] shoe.   Type 2 diabetes mellitus Hemoglobin A1c 6.9.  At baseline on Ozempic  and Jardiance .   HTN Continue amlodipine , losartan    CKD stage IIIb Baseline creatinine 2.3-2.6, stable.   History of gout Allopurinol  50 mg p.o. daily   Dental caries Outpatient follow-up with dental medicine for extraction  Obesity, class I Body mass index is 34.37  kg/m.  Discharge Diagnoses:  Principal Problem:   Diabetic foot infection [facility])    Discharge Instructions  Discharge Instructions     Call MD for:  difficulty breathing, headache or visual disturbances   Complete by: As directed    Call MD for:  extreme fatigue   Complete by: As directed    Call MD for:  persistant dizziness or light-headedness   Complete by: As directed    Call MD for:  persistant nausea and vomiting   Complete by: As directed    Call MD for:  severe uncontrolled pain   Complete by: As directed    Call MD for:  temperature >100.4   Complete by: As directed    Diet - low sodium heart healthy   Complete by: As directed    Increase activity slowly   Complete by: As directed       Allergies as of 09/22/2023   No Known Allergies      Medication List     STOP taking these medications    aspirin  81 MG chewable tablet   benzocaine  10 % mucosal gel Commonly known as: ORAJEL   ciprofloxacin -dexamethasone  OTIC suspension Commonly known as: Ciprodex    diclofenac  Sodium 1 % Gel Commonly known as: VOLTAREN    ibuprofen  600 MG tablet Commonly known as: ADVIL    oxyCODONE  5 MG immediate release tablet Commonly known as: Roxicodone        TAKE these medications    acetaminophen  325 MG tablet Commonly known as: Tylenol  Take 2 tablets (650 mg total) by mouth every 6 (six) hours as needed.   allopurinol  300 MG tablet Commonly known as: ZYLOPRIM  Take 1 tablet (300 mg total) by mouth daily.   amLODipine  10 MG tablet Commonly known as: NORVASC  Take 10 mg by mouth daily. What changed: Another medication with the same name was removed. Continue taking this medication, and follow the directions you see here.   atorvastatin  80 MG tablet Commonly known as: LIPITOR Take 1 tablet (80 mg total) by mouth daily.   colchicine  0.6 MG tablet Take 1 tablet (0.6 mg total) by mouth daily as needed. What changed: reasons to take this   DULoxetine  30 MG  capsule Commonly known as: CYMBALTA  Take 30 mg by mouth daily.   Jardiance  25 MG Tabs tablet Generic drug: empagliflozin  Take 25 mg by mouth daily.   linezolid  600 MG tablet Commonly known as: ZYVOX  Take 1 tablet (600 mg total) by mouth 2 (two) times daily for 24 days.   losartan  25 MG tablet Commonly known as: COZAAR  Take 25 mg by mouth 2 (two) times daily.   metroNIDAZOLE  500 MG tablet Commonly known as: FLAGYL  Take 1 tablet (500 mg total) by mouth 2 (two) times daily for 6 days.   naloxone  4 MG/0.1ML Liqd nasal spray kit Commonly known as: NARCAN  Place 1 spray into the nose once.   Ozempic  (2 MG/DOSE) 8 MG/3ML Sopn Generic drug: Semaglutide  (2 MG/DOSE) Inject 2 mg into the skin once a week. What changed: Another medication with the same name was removed. Continue taking this medication, and follow the directions you see here.   traMADol  50 MG tablet Commonly known as: ULTRAM  Take 1 tablet (50 mg total) by mouth every 6 (six) hours as needed for moderate pain (pain score 4-6).        Follow-up Information     Barry Glisson, MD. Schedule an appointment as soon as possible for a  visit in 1 week(s).   Specialty: Family Medicine Contact information: [address] [place] [address] [phone]         [name] Juliene SAUNDERS, DPM. Go on 09/29/2023.   Specialty: Podiatry Contact information: [address] [place] [address] [phone]                No Known Allergies  Consultations: Podiatry   Procedures/Studies: DG KNEE 3 VIEW RIGHT Result Date: 09/22/2023 CLINICAL DATA:  Medial knee pain for 2 days.  No known injury. EXAM: RIGHT KNEE - 3 VIEW COMPARISON:  None Available. FINDINGS: The alignment and joint spaces are preserved. There is mild tricompartmental peripheral spurring, greatest in the medial tibiofemoral compartment. Trace joint effusion. No fracture, erosive or bony destructive change. Unremarkable soft tissues.  IMPRESSION: Mild tricompartmental osteoarthritis. Trace joint effusion. Electronically Signed   By: Andrea Gasman M.D.   On: 09/22/2023 11:33   DG Foot Complete Right Result Date: 09/20/2023 CLINICAL DATA:  Postop EXAM: RIGHT FOOT COMPLETE - 3+ VIEW COMPARISON:  09/18/2023. FINDINGS: No acute fracture. Postop changes with previous resection distal first proximal phalanx. There is a diffuse great toe soft tissue swelling. There is evidence of soft tissue ulceration. No bony destruction. IMPRESSION: Postop changes. Soft tissue swelling and defect or ulceration. Electronically Signed   By: Fonda Field M.D.   On: 09/20/2023 19:04   MR FOOT RIGHT WO CONTRAST Result Date: 09/19/2023 CLINICAL DATA:  Right foot pain and swelling in a diabetic patient. History of prior amputation at the proximal phalanx of the great toe. EXAM: MRI OF THE RIGHT FOREFOOT WITHOUT CONTRAST TECHNIQUE: Multiplanar, multisequence MR imaging of the right forefoot was performed. No intravenous contrast was administered. COMPARISON:  Plain films right foot 09/18/2023. FINDINGS: Bones/Joint/Cartilage Postoperative change of amputation of the mid and distal proximal phalanx of the great toe is identified as seen on plain films. Marrow edema is seen throughout the distal phalanx of the second toe and throughout the distal phalanx and remnant of the proximal phalanx of the great toe consistent with osteomyelitis. No other evidence of osteomyelitis is identified. There is degenerative disease of the midfoot most notable at the first and second tarsometatarsal joints. Mild marrow edema about the second TMT joint extending into the diaphysis of the second metatarsal is most consistent with secondary stress change. 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 Ligaments Intact. Muscles and Tendons Intact. No intramuscular fluid collection. Increased T2 signal in intrinsic musculature of the foot is most consistent with diabetic myopathy. Soft tissues  No focal fluid collection is identified. There is subcutaneous edema about the dorsum of the foot most consistent with dependent change or cellulitis. IMPRESSION: 1. Osteomyelitis throughout the great toe and distal phalanx of the second toe. 2. Subcutaneous edema about the dorsum of the foot most consistent with dependent change or cellulitis. Negative for abscess. 3. Degenerative disease of the midfoot most notable at the first and second tarsometatarsal joints. Mild marrow edema about the second TMT joint extending into the diaphysis of the second metatarsal is most consistent with secondary stress change. 4. First MTP osteoarthritis. Electronically Signed   By: Debby Prader M.D.   On: 09/19/2023 10:25   DG Foot Complete Right Result Date: 09/18/2023 CLINICAL DATA:  Osteomyelitis EXAM: RIGHT FOOT COMPLETE - 3+ VIEW COMPARISON:  06/22/2021 FINDINGS: There is chronic erosive changes involving the distal aspect of the proximal phalanx of the great toe as well as, to a lesser extent, the base  of the distal phalanx of the great toe in keeping with changes of chronic osteomyelitis. There is subcutaneous gas seen within the medial aspect of the great toe in keeping with aggressive infection and/or gangrene. Extensive surrounding soft tissue swelling. Ulcer within the terminal aspect of the left great toe noted with possible exposure of the distal cortex of the distal phalanx at the base of the wound. No acute fracture or dislocation. Remaining joint spaces are preserved. Tiny plantar calcaneal spur. IMPRESSION: 1. Chronic osteomyelitis of the proximal and distal phalanges of the great toe. 2. Subcutaneous gas within the medial aspect of the great toe in keeping with aggressive infection and/or gangrene. 3. Terminal ulcer of the second digit with possible exposure of the distal phalanx of the base of the wound. Electronically Signed   By: Dorethia Molt M.D.   On: 09/18/2023 19:32     Subjective: Patient seen  examined bedside, sitting at edge of bed.  No complaints this morning.  Ready for discharge home.  Has outpatient follow-up scheduled with podiatry.  Will continue antibiotics linezolid  x 4 weeks per podiatry recommendations, metronidazole  for course of 10 days.  Denies headache, no dizziness, no chest pain, no palpitations, no shortness of breath, no abdominal pain, no fever/chills/night sweats, no nausea/vomiting/diarrhea, no focal weakness, no fatigue, no paresthesias.  No acute events overnight per nursing staff.  Discharge Exam: Vitals:   09/21/23 2215 09/22/23 0627  BP: (!) 164/101 (!) 149/96  Pulse: 86 93  Resp: 20 20  Temp: 98.7 F (37.1 C) 98.6 F (37 C)  SpO2: 100% 98%   Vitals:   09/21/23 1325 09/21/23 1531 09/21/23 2215 09/22/23 0627  BP: (!) 182/107 (!) 173/98 (!) 164/101 (!) 149/96  Pulse: 88 91 86 93  Resp: 18  20 20   Temp: 98.3 F (36.8 C)  98.7 F (37.1 C) 98.6 F (37 C)  TempSrc:   Oral Oral  SpO2: 100%  100% 98%  Weight:      Height:        Physical Exam: GEN: NAD, alert and oriented x 3, obese HEENT: NCAT, PERRL, EOMI, sclera clear, MMM PULM: CTAB w/o wheezes/crackles, normal respiratory effort, on room air CV: RRR w/o M/G/R GI: abd soft, NTND, NABS, no R/G/M MSK: no peripheral edema, moves all extremities independently, right foot with surgical dressing in place, clean/dry/intact NEURO: CN II-XII intact, no focal deficits, sensation to light touch intact PSYCH: normal mood/affect Integumentary: Right foot as above, otherwise no other concerning rashes/lesions/wounds noted on exposed skin surfaces     The results of significant diagnostics from this hospitalization (including imaging, microbiology, ancillary and laboratory) are listed below for reference.     Microbiology: Recent Results (from the past 240 hours)  Surgical PCR screen     Status: None   Collection Time: 09/19/23  9:58 PM   Specimen: Nasal Mucosa; Nasal Swab  Result Value Ref Range  Status   MRSA, PCR NEGATIVE NEGATIVE Final   Staphylococcus aureus NEGATIVE NEGATIVE Final    Comment: (NOTE) The Xpert SA Assay (FDA approved for NASAL specimens in patients 24 years of age and older), is one component of a comprehensive surveillance program. It is not intended to diagnose infection nor to guide or monitor treatment. Performed at [facility], [address] [place], [place] [address]   Aerobic/Anaerobic Culture w Gram Stain (surgical/deep wound)     Status: None (Preliminary result)   Collection Time: 09/20/23 10:26 AM   Specimen: Foot, Right; Wound  Result Value Ref Range Status   Specimen Description   Final    WOUND RIGHT FOOT (SPECIMEN A) Performed at [facility], [address], [place], [place] [address]    Special Requests   Final    NONE Performed at [facility], [address], [place], [place] [address]    Gram Stain FEW WBC SEEN RARE GRAM POSITIVE COCCI   Final   Culture   Final    CULTURE REINCUBATED FOR BETTER GROWTH Performed at [facility], [address], [place], [place] [address]    Report Status PENDING  Incomplete  Aerobic/Anaerobic Culture w Gram Stain (surgical/deep wound)     Status: None (Preliminary result)   Collection Time: 09/20/23 10:28 AM   Specimen: Path Tissue  Result Value Ref Range Status   Specimen Description   Final    TISSUE RIGHT HALLUX (SPECIMEN B) Performed at [name], [address], [place], [place] [address]    Special Requests   Final    NONE Performed at [facility], [address], [place], [place] [address]    Gram Stain NO WBC SEEN NO ORGANISMS SEEN   Final   Culture   Final    CULTURE REINCUBATED FOR BETTER GROWTH Performed at [facility], [address], [place], [place] [address]    Report Status PENDING  Incomplete     Labs: BNP (last 3 results) No results for input(s): BNP  in the last 8760 hours. Basic Metabolic Panel: Recent Labs  Lab 09/18/23 1839 09/20/23 0404 09/21/23 0330 09/22/23 0854  NA 139 139 140 138  K 4.4 4.4 4.6 4.4  CL 107 107 107 104  CO2 22 21* 21* 22  GLUCOSE 120* 133* 130* 137*  BUN 30* 31* 32* 30*  CREATININE 2.58* 2.35* 2.56* 2.26*  CALCIUM  8.7* 9.1 9.0 9.3   Liver Function Tests: Recent Labs  Lab 09/18/23 1839 09/20/23 0404 09/22/23 0854  AST 16 11* 13*  ALT 12 9 9   ALKPHOS 56 53 59  BILITOT 0.3 0.4 0.5  PROT 6.8 6.4* 7.2  ALBUMIN 3.5 3.3* 3.5   No results for input(s): LIPASE, AMYLASE in the last 168 hours. No results for input(s): AMMONIA in the last 168 hours. CBC: Recent Labs  Lab 09/18/23 1839 09/20/23 0404 09/21/23 0330  WBC 6.7 8.1 8.9  NEUTROABS 3.3  --   --   HGB 12.9* 12.6* 12.5*  HCT 39.1 37.2* 38.1*  MCV 92.7 91.6 92.3  PLT 312 283 290   Cardiac Enzymes: No results for input(s): CKTOTAL, CKMB, CKMBINDEX, TROPONINI in the last 168 hours. BNP: Invalid input(s): POCBNP CBG: Recent Labs  Lab 09/21/23 1140 09/21/23 1635 09/21/23 2150 09/22/23 0722 09/22/23 1243  GLUCAP 116* 100* 119* 135* 177*   D-Dimer No results for input(s): DDIMER in the last 72 hours. Hgb A1c No results for input(s): HGBA1C in the last 72 hours. Lipid Profile No results for input(s): CHOL, HDL, LDLCALC, TRIG, CHOLHDL, LDLDIRECT in the last 72 hours. Thyroid  function studies No results for input(s): TSH, T4TOTAL, T3FREE, THYROIDAB in the last 72 hours.  Invalid input(s): FREET3 Anemia work up No results for input(s): VITAMINB12, FOLATE, FERRITIN, TIBC, IRON, RETICCTPCT in the last 72 hours. Urinalysis    Component Value Date/Time   COLORURINE STRAW (A) 09/18/2023 1839   APPEARANCEUR CLEAR 09/18/2023 1839   LABSPEC 1.014 09/18/2023 1839   PHURINE 6.0 09/18/2023 1839   GLUCOSEU >=500 (A)  09/18/2023 1839   HGBUR NEGATIVE 09/18/2023 1839   BILIRUBINUR  NEGATIVE 09/18/2023 1839   KETONESUR NEGATIVE 09/18/2023 1839   PROTEINUR >=300 (A) 09/18/2023 1839   NITRITE NEGATIVE 09/18/2023 1839   LEUKOCYTESUR NEGATIVE 09/18/2023 1839   Sepsis Labs Recent Labs  Lab 09/18/23 1839 09/20/23 0404 09/21/23 0330  WBC 6.7 8.1 8.9   Microbiology Recent Results (from the past 240 hours)  Surgical PCR screen     Status: None   Collection Time: 09/19/23  9:58 PM   Specimen: Nasal Mucosa; Nasal Swab  Result Value Ref Range Status   MRSA, PCR NEGATIVE NEGATIVE Final   Staphylococcus aureus NEGATIVE NEGATIVE Final    Comment: (NOTE) The Xpert SA Assay (FDA approved for NASAL specimens in patients 5 years of age and older), is one component of a comprehensive surveillance program. It is not intended to diagnose infection nor to guide or monitor treatment. Performed at [facility], [address] [place], [place] [address]   Aerobic/Anaerobic Culture w Gram Stain (surgical/deep wound)     Status: None (Preliminary result)   Collection Time: 09/20/23 10:26 AM   Specimen: Foot, Right; Wound  Result Value Ref Range Status   Specimen Description   Final    WOUND RIGHT FOOT (SPECIMEN A) Performed at [facility], [address] [place], [place] [address]    Special Requests   Final    NONE Performed at [name], [address] [place], [place] [address]    Gram Stain FEW WBC SEEN RARE GRAM POSITIVE COCCI   Final   Culture   Final    CULTURE REINCUBATED FOR BETTER GROWTH Performed at [facility], [address] [place], [place] [address]    Report Status PENDING  Incomplete  Aerobic/Anaerobic Culture w Gram Stain (surgical/deep wound)     Status: None (Preliminary result)   Collection Time: 09/20/23 10:28 AM   Specimen: Path Tissue  Result Value Ref Range Status   Specimen Description   Final    TISSUE RIGHT HALLUX (SPECIMEN B) Performed at [facility], [address] [name] [address], [place], [place] [address]    Special Requests   Final    NONE Performed at [facility], [address], [place], [place] [address]    Gram Stain NO WBC SEEN NO ORGANISMS SEEN   Final   Culture   Final    CULTURE REINCUBATED FOR BETTER GROWTH Performed at [facility], [address], [place], [place] [address]    Report Status PENDING  Incomplete     Time coordinating discharge: Over 30 minutes  SIGNED:   Camellia PARAS [place], DO  [facility] 09/22/2023, 12:57 PM

## 2023-09-22 NOTE — Telephone Encounter (Signed)
 Patient's appointment is set for 09/29/2023. He mentioned you said he should come in this week. Is this accurate?

## 2023-09-22 NOTE — Progress Notes (Signed)
 AVS reviewed w/ patient who verbalized an understanding- Pt has enough zyvox  for 3 days - pharmacy will mail the remainder, pt verbalized an understanding. PIV removed as noted. Pt dressing for d/c to home. Discharge meds in a secure bag delivered to pt in room by this RN

## 2023-09-23 ENCOUNTER — Other Ambulatory Visit: Payer: Self-pay

## 2023-09-23 LAB — SURGICAL PATHOLOGY

## 2023-09-25 ENCOUNTER — Ambulatory Visit: Payer: Self-pay | Admitting: Podiatry

## 2023-09-25 LAB — AEROBIC/ANAEROBIC CULTURE W GRAM STAIN (SURGICAL/DEEP WOUND)
Culture: NORMAL
Culture: NO GROWTH
Gram Stain: NONE SEEN

## 2023-09-29 ENCOUNTER — Ambulatory Visit (INDEPENDENT_AMBULATORY_CARE_PROVIDER_SITE_OTHER): Admitting: Podiatry

## 2023-09-29 ENCOUNTER — Ambulatory Visit (INDEPENDENT_AMBULATORY_CARE_PROVIDER_SITE_OTHER)

## 2023-09-29 ENCOUNTER — Encounter: Payer: Self-pay | Admitting: Podiatry

## 2023-09-29 VITALS — Ht 75.0 in | Wt 275.0 lb

## 2023-09-29 DIAGNOSIS — M7751 Other enthesopathy of right foot: Secondary | ICD-10-CM

## 2023-09-29 DIAGNOSIS — M86671 Other chronic osteomyelitis, right ankle and foot: Secondary | ICD-10-CM

## 2023-10-02 NOTE — Progress Notes (Signed)
  Subjective:  Patient ID: [name], male    DOB: [birth year],  MRN: [id number]  Chief Complaint  Patient presents with   Post-op Follow-up    Rm 2 hospital surgery pov#1 dos 09/20/23 R hallux toe bone biopsy. Sutures are intact with swelling to the right hallux.     48 y.o. male returns for post-op check.   Review of Systems: Negative except as noted in the HPI. Denies N/V/F/Ch.   Objective:  There were no vitals filed for this visit. Body mass index is 34.37 kg/m. Constitutional Well developed. Well nourished.  Vascular Foot warm and well perfused. Capillary refill normal to all digits.  Calf is soft and supple, no posterior calf or knee pain, negative Homans' sign  Neurologic Normal speech. Oriented to person, place, and time. Epicritic sensation to light touch grossly reduced bilaterally.  Dermatologic Skin healing well without signs of infection. Skin edges well coapted without signs of infection.  Orthopedic: Tenderness to palpation noted about the surgical site.   Joint culture grew GPC's without speciation  Bone culture grew staph epi with corynebacterium  Surgical path results negative for acute or chronic osteomyelitis  Multiple view plain film radiographs: No interval changes since immediate postoperative films Assessment:   1. Other chronic osteomyelitis of right foot (HCC)    Plan:  Patient was evaluated and treated and all questions answered.  S/p foot surgery right -Progressing as expected post-operatively. -XR: noted above no issues -WB Status: weightbearing as tolerated in next field -Sutures: return in 2 weeks to remove. -Medications: currently on Zyvox .  Would like to have him treated for presumed osteomyelitis despite negative pathology his bone culture did grow staph epi and corynebacterium.would be a good candidate for dalbavancin infusion. Referral will be placed -Foot redressed.  Return in about 2 weeks (around 10/13/2023) for suture  removal, post op (no x-rays).

## 2023-10-16 ENCOUNTER — Encounter: Payer: Self-pay | Admitting: Lab

## 2023-10-22 ENCOUNTER — Encounter: Payer: Self-pay | Admitting: Podiatry

## 2023-10-22 ENCOUNTER — Ambulatory Visit: Admitting: Podiatry

## 2023-10-22 VITALS — Ht 75.0 in | Wt 275.0 lb

## 2023-10-22 DIAGNOSIS — M86671 Other chronic osteomyelitis, right ankle and foot: Secondary | ICD-10-CM

## 2023-10-25 NOTE — Progress Notes (Signed)
  Subjective:  Patient ID: Barry [name], male    DOB: 15-Mar-1975,  MRN: [id number]  Chief Complaint  Patient presents with   Post-op Follow-up    RM 22 suture removal, post op (no x-rays). Right is swollen, pt states minimum pain/discomfort.     48 y.o. male returns for post-op check.  Overall doing okay, he has been contacted by the infusion company for the dalbavancin infusions but has not connected with them yet  Review of Systems: Negative except as noted in the HPI. Denies N/V/F/Ch.   Objective:  There were no vitals filed for this visit. Body mass index is 34.37 kg/m. Constitutional Well developed. Well nourished.  Vascular Foot warm and well perfused. Capillary refill normal to all digits.  Calf is soft and supple, no posterior calf or knee pain, negative Homans' sign  Neurologic Normal speech. Oriented to person, place, and time. Epicritic sensation to light touch grossly reduced bilaterally.  Dermatologic Skin healing well without signs of infection. Skin edges well coapted without signs of infection.  Orthopedic: He has no pain to palpation noted about the surgical site.  There is mild edema   Joint culture grew GPC's without speciation  Bone culture grew staph epi with corynebacterium  Surgical path results negative for acute or chronic osteomyelitis  Multiple view plain film radiographs: No interval changes since immediate postoperative films Assessment:   1. Other chronic osteomyelitis of right foot (HCC)    Plan:  Patient was evaluated and treated and all questions answered.  S/p foot surgery right - Sutures removed uneventfully.  Still would like him to have treatment for osteomyelitis with the dalbavancin.  He will contact the infusion to set up a time, he spoke with our office about coordinating this as well.  He will see me back in 3 weeks for new radiographs.  Return in about 3 weeks (around 11/12/2023) for post op (new x-rays).

## 2023-11-12 ENCOUNTER — Encounter: Admitting: Podiatry
